# Patient Record
Sex: Male | Born: 1940
Health system: Southern US, Community
[De-identification: ages and names within clinical notes are randomized; demographics above are authoritative.]

## PROBLEM LIST (undated history)

## (undated) DIAGNOSIS — E119 Type 2 diabetes mellitus without complications: Secondary | ICD-10-CM

## (undated) DIAGNOSIS — K5790 Diverticulosis of intestine, part unspecified, without perforation or abscess without bleeding: Secondary | ICD-10-CM

## (undated) DIAGNOSIS — Z860100 Personal history of colon polyps, unspecified: Secondary | ICD-10-CM

## (undated) DIAGNOSIS — M1990 Unspecified osteoarthritis, unspecified site: Secondary | ICD-10-CM

## (undated) DIAGNOSIS — G20A1 Parkinson's disease without dyskinesia, without mention of fluctuations: Secondary | ICD-10-CM

## (undated) DIAGNOSIS — R351 Nocturia: Secondary | ICD-10-CM

## (undated) DIAGNOSIS — K2270 Barrett's esophagus without dysplasia: Secondary | ICD-10-CM

## (undated) DIAGNOSIS — I10 Essential (primary) hypertension: Secondary | ICD-10-CM

## (undated) DIAGNOSIS — N529 Male erectile dysfunction, unspecified: Secondary | ICD-10-CM

## (undated) DIAGNOSIS — H9190 Unspecified hearing loss, unspecified ear: Secondary | ICD-10-CM

## (undated) DIAGNOSIS — E785 Hyperlipidemia, unspecified: Secondary | ICD-10-CM

## (undated) DIAGNOSIS — F319 Bipolar disorder, unspecified: Secondary | ICD-10-CM

## (undated) DIAGNOSIS — K219 Gastro-esophageal reflux disease without esophagitis: Secondary | ICD-10-CM

## (undated) DIAGNOSIS — J449 Chronic obstructive pulmonary disease, unspecified: Secondary | ICD-10-CM

## (undated) DIAGNOSIS — K648 Other hemorrhoids: Secondary | ICD-10-CM

## (undated) DIAGNOSIS — N189 Chronic kidney disease, unspecified: Secondary | ICD-10-CM

## (undated) DIAGNOSIS — K52839 Microscopic colitis, unspecified: Secondary | ICD-10-CM

## (undated) DIAGNOSIS — E669 Obesity, unspecified: Secondary | ICD-10-CM

## (undated) DIAGNOSIS — M2540 Effusion, unspecified joint: Secondary | ICD-10-CM

## (undated) DIAGNOSIS — H409 Unspecified glaucoma: Secondary | ICD-10-CM

## (undated) DIAGNOSIS — M2550 Pain in unspecified joint: Secondary | ICD-10-CM

## (undated) DIAGNOSIS — N400 Enlarged prostate without lower urinary tract symptoms: Secondary | ICD-10-CM

## (undated) DIAGNOSIS — IMO0001 Reserved for inherently not codable concepts without codable children: Secondary | ICD-10-CM

## (undated) DIAGNOSIS — G4700 Insomnia, unspecified: Secondary | ICD-10-CM

## (undated) DIAGNOSIS — J189 Pneumonia, unspecified organism: Secondary | ICD-10-CM

## (undated) DIAGNOSIS — F419 Anxiety disorder, unspecified: Secondary | ICD-10-CM

## (undated) DIAGNOSIS — K5010 Crohn's disease of large intestine without complications: Secondary | ICD-10-CM

## (undated) DIAGNOSIS — F32A Depression, unspecified: Secondary | ICD-10-CM

## (undated) DIAGNOSIS — F329 Major depressive disorder, single episode, unspecified: Secondary | ICD-10-CM

## (undated) DIAGNOSIS — G4730 Sleep apnea, unspecified: Secondary | ICD-10-CM

## (undated) HISTORY — DX: Unspecified osteoarthritis, unspecified site: M19.90

## (undated) HISTORY — PX: ROTATOR CUFF REPAIR: SHX139

## (undated) HISTORY — DX: Sleep apnea, unspecified: G47.30

## (undated) HISTORY — PX: EYE SURGERY: SHX253

## (undated) HISTORY — DX: Parkinson's disease without dyskinesia, without mention of fluctuations: G20.A1

## (undated) HISTORY — DX: Microscopic colitis, unspecified: K52.839

## (undated) HISTORY — DX: Chronic obstructive pulmonary disease, unspecified: J44.9

## (undated) HISTORY — DX: Other hemorrhoids: K64.8

## (undated) HISTORY — DX: Male erectile dysfunction, unspecified: N52.9

## (undated) HISTORY — DX: Hyperlipidemia, unspecified: E78.5

## (undated) HISTORY — PX: TONSILLECTOMY: SUR1361

## (undated) HISTORY — PX: HERNIA REPAIR: SHX51

## (undated) HISTORY — DX: Benign prostatic hyperplasia without lower urinary tract symptoms: N40.0

## (undated) HISTORY — DX: Major depressive disorder, single episode, unspecified: F32.9

## (undated) HISTORY — DX: Barrett's esophagus without dysplasia: K22.70

## (undated) HISTORY — PX: UMBILICAL HERNIA REPAIR: SHX196

## (undated) HISTORY — DX: Essential (primary) hypertension: I10

## (undated) HISTORY — DX: Pneumonia, unspecified organism: J18.9

## (undated) HISTORY — DX: Depression, unspecified: F32.A

## (undated) HISTORY — DX: Crohn's disease of large intestine without complications: K50.10

## (undated) HISTORY — DX: Obesity, unspecified: E66.9

## (undated) HISTORY — DX: Type 2 diabetes mellitus without complications: E11.9

## (undated) HISTORY — DX: Diverticulosis of intestine, part unspecified, without perforation or abscess without bleeding: K57.90

## (undated) HISTORY — DX: Anxiety disorder, unspecified: F41.9

---

## 1995-08-15 HISTORY — PX: OTHER SURGICAL HISTORY: SHX169

## 2003-09-21 ENCOUNTER — Encounter: Admission: RE | Admit: 2003-09-21 | Discharge: 2003-12-20 | Payer: Self-pay | Admitting: Endocrinology

## 2004-12-11 ENCOUNTER — Encounter: Admission: RE | Admit: 2004-12-11 | Discharge: 2004-12-11 | Payer: Self-pay | Admitting: Endocrinology

## 2006-02-12 ENCOUNTER — Ambulatory Visit: Payer: Self-pay | Admitting: Pulmonary Disease

## 2006-02-13 ENCOUNTER — Ambulatory Visit: Admission: RE | Admit: 2006-02-13 | Discharge: 2006-02-13 | Payer: Self-pay | Admitting: Pulmonary Disease

## 2006-02-13 ENCOUNTER — Encounter: Payer: Self-pay | Admitting: Pulmonary Disease

## 2006-04-10 ENCOUNTER — Inpatient Hospital Stay (HOSPITAL_COMMUNITY): Admission: EM | Admit: 2006-04-10 | Discharge: 2006-04-11 | Payer: Self-pay | Admitting: Emergency Medicine

## 2006-08-14 HISTORY — PX: COLONOSCOPY W/ BIOPSIES: SHX1374

## 2007-01-02 ENCOUNTER — Ambulatory Visit: Payer: Self-pay | Admitting: Internal Medicine

## 2007-01-04 ENCOUNTER — Ambulatory Visit: Payer: Self-pay | Admitting: Gastroenterology

## 2007-01-13 DIAGNOSIS — K648 Other hemorrhoids: Secondary | ICD-10-CM

## 2007-01-13 DIAGNOSIS — K5790 Diverticulosis of intestine, part unspecified, without perforation or abscess without bleeding: Secondary | ICD-10-CM

## 2007-01-13 HISTORY — DX: Diverticulosis of intestine, part unspecified, without perforation or abscess without bleeding: K57.90

## 2007-01-13 HISTORY — DX: Other hemorrhoids: K64.8

## 2007-01-22 ENCOUNTER — Encounter: Payer: Self-pay | Admitting: Internal Medicine

## 2007-01-22 ENCOUNTER — Ambulatory Visit: Payer: Self-pay | Admitting: Internal Medicine

## 2007-01-22 DIAGNOSIS — K5730 Diverticulosis of large intestine without perforation or abscess without bleeding: Secondary | ICD-10-CM | POA: Insufficient documentation

## 2007-01-22 DIAGNOSIS — K649 Unspecified hemorrhoids: Secondary | ICD-10-CM | POA: Insufficient documentation

## 2007-03-05 ENCOUNTER — Ambulatory Visit: Payer: Self-pay | Admitting: Internal Medicine

## 2007-05-23 ENCOUNTER — Ambulatory Visit: Payer: Self-pay | Admitting: Internal Medicine

## 2007-10-13 DIAGNOSIS — J189 Pneumonia, unspecified organism: Secondary | ICD-10-CM

## 2007-10-13 HISTORY — DX: Pneumonia, unspecified organism: J18.9

## 2007-10-23 DIAGNOSIS — E119 Type 2 diabetes mellitus without complications: Secondary | ICD-10-CM | POA: Insufficient documentation

## 2007-10-23 DIAGNOSIS — F411 Generalized anxiety disorder: Secondary | ICD-10-CM | POA: Insufficient documentation

## 2007-10-23 DIAGNOSIS — J449 Chronic obstructive pulmonary disease, unspecified: Secondary | ICD-10-CM | POA: Insufficient documentation

## 2007-10-23 DIAGNOSIS — E669 Obesity, unspecified: Secondary | ICD-10-CM | POA: Insufficient documentation

## 2007-10-23 DIAGNOSIS — F329 Major depressive disorder, single episode, unspecified: Secondary | ICD-10-CM | POA: Insufficient documentation

## 2007-10-23 DIAGNOSIS — G4730 Sleep apnea, unspecified: Secondary | ICD-10-CM | POA: Insufficient documentation

## 2007-10-23 DIAGNOSIS — J45909 Unspecified asthma, uncomplicated: Secondary | ICD-10-CM | POA: Insufficient documentation

## 2007-10-23 DIAGNOSIS — J4489 Other specified chronic obstructive pulmonary disease: Secondary | ICD-10-CM | POA: Insufficient documentation

## 2007-10-23 DIAGNOSIS — E785 Hyperlipidemia, unspecified: Secondary | ICD-10-CM | POA: Insufficient documentation

## 2007-10-23 DIAGNOSIS — I10 Essential (primary) hypertension: Secondary | ICD-10-CM | POA: Insufficient documentation

## 2007-10-29 ENCOUNTER — Other Ambulatory Visit: Payer: Self-pay | Admitting: Emergency Medicine

## 2007-10-29 ENCOUNTER — Inpatient Hospital Stay (HOSPITAL_COMMUNITY): Admission: AD | Admit: 2007-10-29 | Discharge: 2007-11-04 | Payer: Self-pay | Admitting: Internal Medicine

## 2007-11-04 ENCOUNTER — Encounter: Payer: Self-pay | Admitting: Internal Medicine

## 2007-11-12 ENCOUNTER — Ambulatory Visit: Payer: Self-pay | Admitting: Pulmonary Disease

## 2007-12-23 ENCOUNTER — Ambulatory Visit: Payer: Self-pay | Admitting: Pulmonary Disease

## 2008-01-08 ENCOUNTER — Encounter (INDEPENDENT_AMBULATORY_CARE_PROVIDER_SITE_OTHER): Payer: Self-pay | Admitting: *Deleted

## 2008-01-15 ENCOUNTER — Ambulatory Visit: Payer: Self-pay | Admitting: Internal Medicine

## 2008-01-15 DIAGNOSIS — K5900 Constipation, unspecified: Secondary | ICD-10-CM | POA: Insufficient documentation

## 2008-01-15 DIAGNOSIS — K52839 Microscopic colitis, unspecified: Secondary | ICD-10-CM | POA: Insufficient documentation

## 2008-12-01 ENCOUNTER — Encounter: Admission: RE | Admit: 2008-12-01 | Discharge: 2008-12-01 | Payer: Self-pay | Admitting: Internal Medicine

## 2009-01-19 ENCOUNTER — Encounter: Admission: RE | Admit: 2009-01-19 | Discharge: 2009-01-19 | Payer: Self-pay | Admitting: Internal Medicine

## 2009-08-14 HISTORY — PX: CATARACT EXTRACTION W/ INTRAOCULAR LENS  IMPLANT, BILATERAL: SHX1307

## 2009-08-14 HISTORY — PX: CHOLECYSTECTOMY: SHX55

## 2009-08-14 HISTORY — PX: NOSE SURGERY: SHX723

## 2009-10-01 ENCOUNTER — Emergency Department (HOSPITAL_COMMUNITY): Admission: EM | Admit: 2009-10-01 | Discharge: 2009-10-01 | Payer: Self-pay | Admitting: Emergency Medicine

## 2009-11-22 ENCOUNTER — Emergency Department (HOSPITAL_BASED_OUTPATIENT_CLINIC_OR_DEPARTMENT_OTHER): Admission: EM | Admit: 2009-11-22 | Discharge: 2009-11-22 | Payer: Self-pay | Admitting: Emergency Medicine

## 2009-11-22 ENCOUNTER — Ambulatory Visit: Payer: Self-pay | Admitting: Diagnostic Radiology

## 2009-11-29 ENCOUNTER — Ambulatory Visit (HOSPITAL_COMMUNITY): Admission: RE | Admit: 2009-11-29 | Discharge: 2009-11-30 | Payer: Self-pay | Admitting: General Surgery

## 2009-11-29 ENCOUNTER — Encounter (INDEPENDENT_AMBULATORY_CARE_PROVIDER_SITE_OTHER): Payer: Self-pay | Admitting: General Surgery

## 2009-12-07 ENCOUNTER — Ambulatory Visit: Payer: Self-pay | Admitting: Otolaryngology

## 2010-02-16 DIAGNOSIS — F339 Major depressive disorder, recurrent, unspecified: Secondary | ICD-10-CM | POA: Insufficient documentation

## 2010-02-16 DIAGNOSIS — E1121 Type 2 diabetes mellitus with diabetic nephropathy: Secondary | ICD-10-CM | POA: Insufficient documentation

## 2010-06-04 ENCOUNTER — Emergency Department: Payer: Self-pay | Admitting: Emergency Medicine

## 2010-07-19 ENCOUNTER — Encounter: Admission: RE | Admit: 2010-07-19 | Discharge: 2010-07-19 | Payer: Self-pay | Admitting: Internal Medicine

## 2010-08-14 HISTORY — PX: PENILE PROSTHESIS IMPLANT: SHX240

## 2010-08-31 ENCOUNTER — Emergency Department: Payer: Self-pay | Admitting: Unknown Physician Specialty

## 2010-09-06 ENCOUNTER — Ambulatory Visit
Admission: RE | Admit: 2010-09-06 | Discharge: 2010-09-06 | Payer: Self-pay | Source: Home / Self Care | Attending: Internal Medicine | Admitting: Internal Medicine

## 2010-09-06 DIAGNOSIS — R197 Diarrhea, unspecified: Secondary | ICD-10-CM | POA: Insufficient documentation

## 2010-09-06 LAB — URINALYSIS, ROUTINE W REFLEX MICROSCOPIC
Bilirubin Urine: NEGATIVE
Hgb urine dipstick: NEGATIVE
Ketones, ur: NEGATIVE mg/dL
Nitrite: NEGATIVE
Protein, ur: NEGATIVE mg/dL
Specific Gravity, Urine: 1.017 (ref 1.005–1.030)
Urine Glucose, Fasting: NEGATIVE mg/dL
Urobilinogen, UA: 0.2 mg/dL (ref 0.0–1.0)
pH: 5.5 (ref 5.0–8.0)

## 2010-09-06 LAB — COMPREHENSIVE METABOLIC PANEL
ALT: 23 U/L (ref 0–53)
AST: 21 U/L (ref 0–37)
Albumin: 3.8 g/dL (ref 3.5–5.2)
Alkaline Phosphatase: 61 U/L (ref 39–117)
BUN: 17 mg/dL (ref 6–23)
CO2: 24 mEq/L (ref 19–32)
Calcium: 10 mg/dL (ref 8.4–10.5)
Chloride: 109 mEq/L (ref 96–112)
Creatinine, Ser: 1.27 mg/dL (ref 0.4–1.5)
GFR calc Af Amer: >60 mL/min (ref >60)
GFR calc non Af Amer: 57 mL/min — ABNORMAL LOW (ref >60)
Glucose, Bld: 112 mg/dL — ABNORMAL HIGH (ref 70–99)
Potassium: 4.3 mEq/L (ref 3.5–5.1)
Sodium: 140 mEq/L (ref 135–145)
Total Bilirubin: 0.6 mg/dL (ref 0.3–1.2)
Total Protein: 6.7 g/dL (ref 6.0–8.3)

## 2010-09-06 LAB — CBC
HCT: 49.6 % (ref 39.0–52.0)
Hemoglobin: 16.3 g/dL (ref 13.0–17.0)
MCH: 30.3 pg (ref 26.0–34.0)
MCHC: 32.9 g/dL (ref 30.0–36.0)
MCV: 92.2 fL (ref 78.0–100.0)
Platelets: 212 10*3/uL (ref 150–400)
RBC: 5.38 MIL/uL (ref 4.22–5.81)
RDW: 15.6 % — ABNORMAL HIGH (ref 11.5–15.5)
WBC: 7.6 10*3/uL (ref 4.0–10.5)

## 2010-09-06 LAB — APTT: aPTT: 25 seconds (ref 24–37)

## 2010-09-06 LAB — SURGICAL PCR SCREEN
MRSA, PCR: NEGATIVE
Staphylococcus aureus: NEGATIVE

## 2010-09-06 LAB — PROTIME-INR
INR: 0.88 (ref 0.00–1.49)
Prothrombin Time: 12.1 seconds (ref 11.6–15.2)

## 2010-09-08 ENCOUNTER — Inpatient Hospital Stay (HOSPITAL_COMMUNITY)
Admission: RE | Admit: 2010-09-08 | Discharge: 2010-09-09 | Payer: Self-pay | Source: Home / Self Care | Attending: Orthopedic Surgery | Admitting: Orthopedic Surgery

## 2010-09-08 LAB — GLUCOSE, CAPILLARY
Glucose-Capillary: 97 mg/dL (ref 70–99)
Glucose-Capillary: 127 mg/dL — ABNORMAL HIGH (ref 70–99)
Glucose-Capillary: 105 mg/dL — ABNORMAL HIGH (ref 70–99)
Glucose-Capillary: 130 mg/dL — ABNORMAL HIGH (ref 70–99)
Glucose-Capillary: 119 mg/dL — ABNORMAL HIGH (ref 70–99)

## 2010-09-09 LAB — GLUCOSE, CAPILLARY: Glucose-Capillary: 120 mg/dL — ABNORMAL HIGH (ref 70–99)

## 2010-09-13 NOTE — Discharge Summary (Signed)
Summary: Pneumonia admission 3/17-3/23  NAME:  [name], [name]                ACCOUNT NO.:  [id number]      MEDICAL RECORD NO.:  [id number]          PATIENT TYPE:  INP      LOCATION:  [id number]                         FACILITY:  [facility]      PHYSICIAN:  Janalyn Rouse, M.D.  DATE OF BIRTH:  Dec 29, 1940      DATE OF ADMISSION:  10/29/2007   DATE OF DISCHARGE:  11/04/2007                                  DISCHARGE SUMMARY      DISCHARGE DIAGNOSES:   1. Nausea/vomiting/diarrhea, likely viral gastroenteritis versus toxin       negative Clostridium difficile colitis, resolved.   2. Right upper lobe community-acquired pneumonia, improving.   3. Thrombocytopenia, resolved.   4. Asthma.   5. Type 2 diabetes.   6. Hypertension.   7. Obstructive sleep apnea.   8. Status post uvulopalatopharyngoplasty on continuous positive airway       pressure at home.   9. Depression/anxiety.   10.Hyperlipidemia.   11.Benign prostatic hypertrophy.   12.Erectile dysfunction.      DISCHARGE MEDICATIONS:   1. Augmentin 875 mg b.i.d. x3 days.   2. Flagyl 500 mg q.8 hours x7 days.   3. Phenergan 25 mg q.6 hours p.r.n. nausea.   4. Altace 10 mg daily.   5. Diovan 80 mg daily.   6. Lipitor 20 mg daily.   7. Bupropion XL 150 mg 3 daily.   8. Fortamet 1000 mg b.i.d.   9. Advair 250/50 1 puff b.i.d.   10.Combivent p.r.n.   11.Aspirin 81 mg daily.   12.Xanax 0.5 mg p.r.n.   13.Ambien 10 mg every night p.r.n.   14.Fish oil 3 daily.   15.Flomax 0.4 mg every night.   16.Viagra as needed.      HOSPITAL PROCEDURES:   1. CT of the chest patchy infiltrate of the right lobe and to a less       extent right lower lobe (October 31, 2007).   2. CT of the abdomen and pelvis with contrast (October 31, 2007).   3. Negative CT of the abdomen with exception of gallstones dependent       within the gallbladder.      HISTORY OF PRESENT ILLNESS:  For full details, please see dictated   history and physical.  Briefly, [name] is  a 70 year old [place] male   with a history of type 2 diabetes, hypertension, and asthma, who   presents to the office on October 29, 2007, with nausea/vomiting/diarrhea   for 2 days.  The patient has had a complicated course recently with   recurrent asthmatic bronchitis, which was treated from September 30, 2007, through October 07, 2007, with Avelox followed by a Sterapred   taper.  He had persistent symptoms, which were worsening and he was   given an additional 7 days of Levaquin 500 mg daily from October 15, 2007,   through October 22, 2007.  Of note, chest x-rays obtained on these dates   showed no signs of  infiltrate.  He was slowly improving  with this when   he developed acute onset of nausea, vomiting, and diarrhea 2 days prior   to presentation.  This began 12 hours after eating at a buffet   restaurant in [place].  None of the other family members who ate there   became sick, but he developed perfused watery diarrhea, which he   described as 30 episodes within 24-hour period.  He did have nausea and   vomiting with food tinged emesis with no bilious or hematemesis.  He   came to the emergency room on the evening prior to admission and was   discharged after receiving some fluid hydration.  However, he was unable   to keep fluids down at home and required admission for dehydration.      HOSPITAL COURSE:  The patient was admitted to a medical bed.  He was   placed empirically on Flagyl to cover C. difficile.  C. difficile toxin   was obtained x3 and were negative.  However, given his high risk of   exposure due to recent recurrent antibiotic use, he was continued on   Flagyl.  He also received IV fluids and scheduled antiemetics with   resolution of his nausea and vomiting rapidly and resolution of his   diarrhea within several days.  However, he continued to have fever up to   102 on the day following admission.  Chest and acute abdominal series   performed did show air-fluid  levels and nondistended bowel with   nonspecific patchy right upper lobe infiltrate, which necessitated   followup.  Therefore, on October 31, 2007 a CT of the chest, abdomen, and   pelvis was obtained that showed hazy right upper lobe infiltrate   consistent with pneumonia and minimal right lower lobe patchy   infiltrate.  Therefore, he was placed on Zosyn to treat resistant   community-required pneumonia and possible aspiration in the setting of   his nausea and vomiting.  With the Zosyn, his fever resolved and his   cough improved.  He continued to have mild shortness of breath.  Repeat   chest x-ray showed no residual infiltrate and no signs of pulmonary   edema.  With continued antibiotic therapy, in addition to his home   Advair and bronchodilator therapy, his breathing improved and is at this   point approaching baseline.  He is remained afebrile for several days   and is felt stable for discharge home on p.o. antibiotic therapy.  Given   response to Zosyn, he will continue on Augmentin as an alternative.  He   has failed 2 rounds of quinolone therapy which may indicate pneumonia   resistant to quinolones or a new event.      The patient's diabetes remained well controlled throughout this   hospitalization.  His metformin was held after his CT scan due to   contrast reaction and he was covered with sliding scale insulin.  His   blood pressure likewise remained well controlled.  Of note, he did have   transient thrombocytopenia with platelets count down to 120, but those   have returned to normal and are up to 154 on the day of discharge.  This   has likely resulted his acute illness.  He did not receive any Lovenox   during this hospitalization due to his thrombocytopenia.      At this point, the patient is stable for discharge home.  He is   tolerating a full diet with no nausea, vomiting,  or diarrhea.  His cough   and shortness of breath have improved.  He is tolerating p.o. and  will   be transitioned to p.o. antibiotics.      DISCHARGE LABS:  CBC shows a Mayo count of 6.1, hemoglobin 12.9,   platelets 154.  Urinary Legionella antigen negative.  Stool cultures,   O&P, and C. difficile negative x3.  On the day prior to discharge, BMET   showed a sodium of 142, potassium 3.3, chloride 107, bicarb 27, BUN 8,   creatinine 0.3, glucose 116.  AST was mildly evaluated at 58 and ALT of   45.      DISCHARGE DIET:  Cardiac prudent and diabetic diet.      DISCHARGE INSTRUCTIONS:  He was instructed to call if he has any further   nausea, vomiting, or diarrhea.  He is to call if he has fever or   worsening shortness of breath.      HOSPITAL FOLLOWUP:  He will follow up with Dr. Brigitte Pulse as scheduled for his   annual physical and will call if followup is needed prior to that time.      DISPOSITION:  Home.               Janalyn Rouse, M.D.   Electronically Signed            WS/MEDQ  D:  11/04/2007  T:  11/05/2007  Job:  [id number]      cc:   Gatha Mayer, MD,FACG   Ronaldo Miyamoto, M.D.

## 2010-09-13 NOTE — Miscellaneous (Signed)
Summary: cxr   Clinical Lists Changes  Orders: Added new Test order of T-2 View CXR, Same Day ([id number]) - Signed

## 2010-09-13 NOTE — Procedures (Signed)
Summary: Gastroenterology colon  Gastroenterology colon   Imported By: Renne Musca 10/25/2007 11:47:53  _____________________________________________________________________  External Attachment:    Type:   Image     Comment:   External Document

## 2010-09-13 NOTE — Assessment & Plan Note (Signed)
Summary: F/U FROM COLON ABOUT ONE YEAR AGO...AS.  Medications Added LEXAPRO 20 MG  TABS (ESCITALOPRAM OXALATE) Take 1 tablet by mouth once a day MIRALAX   POWD (POLYETHYLENE GLYCOL 3350) 1-2 doses/day SENOKOT S 8.6-50 MG  TABS (SENNOSIDES-DOCUSATE SODIUM) every 3 days prn      Allergies Added: ! * SHELLFISH  History of Present Illness Visit Type: follow up Primary GI MD: Silvano Rusk MD [facility] Primary Provider: Lang Snow, MD Chief Complaint: Patient c/o constipation x 2-3 months.  Patient notes that he often has mid-abdominal pain associated with constipation.  He denies any appearance of blood or melena.  No nausea or vomiting associated with the abdominal pain. History of Present Illness:   Laxative (dulcolax, Senokot, Phillips)once daily x 6 weeks. Also has had alternating bowel habits with loose and hard/constipation. Lexapro started 3 weeks ago and thinks symptoms worse since. Not using Entocort-EC at this time.  Was hospitalized with pneumonia and LFT's went up. He says LFT's back to normal. Thought secondary to meds.          Colonoscopy  Procedure date:  01/22/2007  Findings:      LEFT-SIDED DIVERTICULOSIS SMALL INTERNAL HEMORRHOIDS OTHERWISE NORMAL  RANDOM BIOPSIES TAKEN RE: DIARRHEA SP-Surgical Pathology - STATUS: Final  .          By: Brigitte Pulse MD , JO ANN           Perform Date: 10Jun08 00:01  Ordered By: Carlean Purl MD , Maxtyn Nuzum             Ordered Date: 11Jun08 14:47  Facility: [facility]                               Department: [place]  Service Report Text  [facility]   [address] [place], [place] [address]   Telephone [phone] or [phone] Fax [phone]    REPORT OF SURGICAL PATHOLOGY    Case #: [id number]   Patient Name: [name], [name]   Office Chart Number: [id number]    MRN: [id number]   Pathologist: Neldon Mc, MD   DOB/Age [birth year] (Age: 70) Gender: M   Date Taken: 01/22/2007   Date Received: 01/23/2007    FINAL  DIAGNOSIS    ***MICROSCOPIC EXAMINATION AND DIAGNOSIS***    COLON, RANDOM BIOPSIES: RARE GRANULOMA AND MILD CHRONIC CHANGES,   SEE COMMENT.    COMMENT   There are multiple fragments of benign colorectal type mucosa.   In a few of the fragments there is moderate chronic inflammation   with some irregularity of the gland architecture. There is also   some focal subtle basal plasmacytosis. One fragment shows a rare   ill-defined granuloma near the muscularis mucosae. No active   inflammation or dysplasia is seen.    The findings are those of a rare granuloma associated with mild   chronic changes. This suggests chronic colitis and the   differential diagnosis includes Crohn' s colitis in the   appropriate clinical setting. Clinical correlation is   recommended. (JAS:mj 01/24/07)    mj   Date Reported: 01/24/2007 Neldon Mc, MD   *** Electronically Signed Out By JAS ***    Comments:      Repeat colonoscopy in 10 years.    Procedures Next Due Date:    Colonoscopy: 02/2017     Updated Prior Medication List: ADVAIR DISKUS 500-50 MCG/DOSE  MISC (FLUTICASONE-SALMETEROL) Inhale 1 puff  two times a day SINGULAIR 10 MG  TABS (MONTELUKAST SODIUM) take by mouth as needed ALTACE 10 MG  CAPS (RAMIPRIL) Take 1 tablet by mouth once a day DIOVAN 80 MG  TABS (VALSARTAN) Take 1 tablet by mouth once a day LOW-DOSE ASPIRIN 81 MG  TABS (ASPIRIN) Take 1 tablet by mouth once a day FORTAMET 1000 MG  TB24 (METFORMIN HCL) Take 1 tablet by mouth two times a day FLOMAX 0.4 MG  CP24 (TAMSULOSIN HCL) Take 1 tablet by mouth once a day WELLBUTRIN XL 300 MG  TB24 (BUPROPION HCL) Take 1 tablet by mouth once a day LIPITOR 10 MG  TABS (ATORVASTATIN CALCIUM) Take 1 tablet by mouth once a day COMBIVENT 103-18 MCG/ACT  AERO (IPRATROPIUM-ALBUTEROL) inhale 2 puffs as needed AMBIEN 10 MG  TABS (ZOLPIDEM TARTRATE) take by mouth as needed XANAX 0.5 MG  TABS (ALPRAZOLAM) take by mouth as needed LEXAPRO 20 MG  TABS  (ESCITALOPRAM OXALATE) Take 1 tablet by mouth once a day  Current Allergies (reviewed today): ! * SHELLFISH  Past Medical History:    Reviewed history from 10/23/2007 and no changes required:       OBESITY (ICD-278.00)       HYPERLIPIDEMIA (ICD-272.4)       DEPRESSION (ICD-311)       ANXIETY (ICD-300.00)       SLEEP APNEA (ICD-780.57)       HYPERTENSION (ICD-401.9)       COPD (ICD-496)       DIABETES MELLITUS TYPE II (ICD-250.00)       DIVERTICULOSIS, COLON (ICD-562.10)       MICROSCOPIC COLITIS       ASTHMA (ICD-493.90)       BENIGN PROISTATIC HYPERTROPHY       ERECTILE DYSFUNCTION       PNEUMONIA 3/09  Past Surgical History:    Reviewed history from 10/23/2007 and no changes required:       Tonsillectomy       Umbilical Herniorraphy       Sleep Apnea Surgery   Family History:    Family History of Esophageal Cancer: Paternal Grandmother  Social History:    Reviewed history and no changes required:       Occupation: Retired       Patient has never smoked.        Alcohol Use - no       Daily Caffeine Use-1-2 cups daily       Illicit Drug Use - no       Patient gets regular exercise.   Risk Factors:  Tobacco use:  never Drug use:  no Alcohol use:  no Exercise:  yes  Colonoscopy History:     Date of Last Colonoscopy:  01/22/2007    Results:  LEFT-SIDED DIVERTICULOSIS SMALL INTERNAL HEMORRHOIDS OTHERWISE NORMAL  RANDOM BIOPSIES TAKEN RE: DIARRHEA SP-Surgical Pathology - STATUS: Final  .          By: Brigitte Pulse MD , [place] ANN           Perform Date: 10Jun08 00:01  Ordered By: Carlean Purl MD , Chancy Claros             Ordered Date: 11Jun08 14:47  Facility: [facility]                               Department: [place]  Service Report Text  [facility]   [address] [place], [place]  [id number]   Telephone [phone] or [phone] Fax [phone]    REPORT OF SURGICAL PATHOLOGY    Case #: [id number]   Patient Name: JAIDIN, SHKOLNIK   Office Chart Number:  [id number]    MRN: [id number]   Pathologist: Neldon Mc, MD   DOB/Age [birth year] (Age: 88) Gender: M   Date Taken: 01/22/2007   Date Received: 01/23/2007    FINAL DIAGNOSIS    ***MICROSCOPIC EXAMINATION AND DIAGNOSIS***    COLON, RANDOM BIOPSIES: RARE GRANULOMA AND MILD CHRONIC CHANGES,   SEE COMMENT.    COMMENT   There are multiple fragments of benign colorectal type mucosa.   In a few of the fragments there is moderate chronic inflammation   with some irregularity of the gland architecture. There is also   some focal subtle basal plasmacytosis. One fragment shows a rare   ill-defined granuloma near the muscularis mucosae. No active   inflammation or dysplasia is seen.    The findings are those of a rare granuloma associated with mild   chronic changes. This suggests chronic colitis and the   differential diagnosis includes Crohn' s colitis in the   appropriate clinical setting. Clinical correlation is   recommended. (JAS:mj 01/24/07)    mj   Date Reported: 01/24/2007 Neldon Mc, MD   *** Electronically Signed Out By JAS ***       Vital Signs:  Patient Profile:   70 Years Old Male Height:     69 inches Weight:      204.38 pounds BMI:     30.29 BSA:     2.09 Pulse rate:   80 / minute Pulse rhythm:   regular BP sitting:   122 / 74  (left arm)  Vitals Entered By: Awilda Bill CMA (January 15, 2008 10:47 AM)                  Physical Exam  General:     Well developed, well nourished, no acute distress. Eyes:     anicteric Neck:     Supple; no masses or thyromegaly. Lungs:     Clear throughout to auscultation. Heart:     Regular rate and rhythm; no murmurs, rubs,  or bruits. Abdomen:     Soft, nontender and nondistended. No masses, hepatosplenomegaly. Normal bowel sounds. Medium ventral hernia upper midline Cervical Nodes:     No significant cervical adenopathy. Psych:     Alert and cooperative. Normal mood and affect.    Impression &  Recommendations:  Problem # 1:  CONSTIPATION (ICD-564.00) Assessment: New I suspect a component of IBS, ? medication effects also.  Will try Miralax and as needed stimulant laxative q 2-3 days.  Problem # 2:  COLITIS (ICD-558.9) Assessment: Improved This does not seem to be active now. ? if constipation is related.   Patient Instructions: 1)  Use MiraLax 1-2 doses daily for constipation. 2)  If no bowel movement for 3 days then take senokot (on third day). 3)  Please schedule a follow-up appointment as needed. 4)  Copy Sent To: Drs. Layla Barter    ]

## 2010-09-13 NOTE — Assessment & Plan Note (Signed)
Summary: f/u pna and asthma   PCP:  Lang Snow  Chief Complaint:  Follow up. Pt was hospitalized for pneumonia recently.  pt states he is feeling much better today. [name]  History of Present Illness:  patient returns today for follow-up of his asthma.  He was last seen in 2007, where  pulmonary function studies were ordered and his protime pump inhibitor was increased to b.i.d. dosing for concern of laryngopharyngeal reflux.  The patient had also been noncompliant with his Advair at that time.  He underwent pulmonary function studies that were essentially normal, and never returned for follow-up despite multiple calls to try and get him to do so.  He comes in today where he was recently hospitalized for pneumonia and possible antibiotic-induced diarrhea.  A chest CT during that time showed an infiltrate in the right upper lobe.  From a pulmonary standpoint, he is doing very well.  He feels that his breathing is adequate, and he states that he has been compliant with his Advair and Singulair.  He has no significant cough or purulence.  He has had no fevers chills or sweats.         Vital Signs:  Patient Profile:   70 Years Old Male Weight:      211.50 pounds O2 Sat:      94 % O2 treatment:    Room Air Temp:     97.6 degrees F oral Pulse rate:   80 / minute BP sitting:   104 / 72  (left arm) Cuff size:   regular  Vitals Entered By: Valrie Hart LPN (March 31, [id number] [id number] PM)             Comments Medications reviewed with patient  ..................................................................Benjamin KitchenValrie Hart LPN  March 31, [id number] [id number] PM      Physical Exam  General:     obese male in nad Lungs:     totally clear, no wheezing Heart:     rrr, no mrg     Impression & Recommendations:  Problem # 1:  ASTHMA (ICD-493.90) currently doing well from this standpoint on his usual asthma meds.  He has gotten back to his usual baseline, and has no symptoms of airflow limitation.  He  needs to work on weight loss, and  a more aggressive exercise program.  The good news is that his pft's did not show any fixed airflow obstruction.  I stressed to hm the importance of med compliance. His updated medication list for this problem includes:    Advair Diskus 500-50 Mcg/dose Misc (Fluticasone-salmeterol) ..... Inhale 1 puff two times a day    Singulair 10 Mg Tabs (Montelukast sodium) .[name]... Take by mouth as needed    Combivent 103-18 Mcg/act Aero (Ipratropium-albuterol) ..... Inhale 2 puffs as needed    Problem # 2:  PNEUMONIA DUE TO OTHER SPECIFIED ORGANISM (ICD-483.8) the pt has had no significant cough, purulence, or fever.  He will need a follow cxr in another 4 weeks to make sure has completely cleared.   Medications Added to Medication List This Visit: 1)  Advair Diskus 500-50 Mcg/dose Misc (Fluticasone-salmeterol) .... Inhale 1 puff two times a day 2)  Singulair 10 Mg Tabs (Montelukast sodium) .... Take by mouth as needed 3)  Altace 10 Mg Caps (Ramipril) .... Take 1 tablet by mouth once a day 4)  Diovan 80 Mg Tabs (Valsartan) .... Take 1 tablet by mouth once a day 5)  Low-dose Aspirin 81 Mg Tabs (Aspirin) .... Take 1  tablet by mouth once a day 6)  Nexium 40 Mg Cpdr (Esomeprazole magnesium) .... Take 1 tablet by mouth once a day 7)  Fortamet 1000 Mg Tb24 (Metformin hcl) .... Take 1 tablet by mouth two times a day 8)  Flomax 0.4 Mg Cp24 (Tamsulosin hcl) .... Take 1 tablet by mouth once a day 9)  Wellbutrin Xl 300 Mg Tb24 (Bupropion hcl) .... Take 1 tablet by mouth once a day 10)  Lipitor 10 Mg Tabs (Atorvastatin calcium) .... Take 1 tablet by mouth once a day 11)  Combivent 103-18 Mcg/act Aero (Ipratropium-albuterol) .... Inhale 2 puffs as needed 12)  Ambien 10 Mg Tabs (Zolpidem tartrate) .... Take by mouth as needed 13)  Xanax 0.5 Mg Tabs (Alprazolam) .... Take by mouth as needed   Patient Instructions: 1)  f/u with me in one year. 2)  need a chest xray in the next 4  weeks to follow up pneumonia.this has been put into the computer, and the patient will do this at his convenience.  I will let him know the results. 3)  no change in meds.    ]

## 2010-09-13 NOTE — Letter (Signed)
Summary:  Pulmonary Results Follow Up Letter  [facility]  [address], [place] [address]   Phone: [phone]  Fax: [phone]    01/08/2008 MRN: [id number]  [name] [address] [place], [place]  [id number]  Dear [name],  We have received the results from your recent tests and have been unable to contact you.  Please call our office at [phone] so that Dr. Gwenette Greet or his nurse may review the results with you.    Thank you,  Therapist, [facility]

## 2010-09-15 NOTE — Assessment & Plan Note (Signed)
Summary: DIARRHEA/YF    History of Present Illness Visit Type: Follow-up Visit Primary GI MD: Silvano Rusk MD [facility] Primary Provider: Lutricia Feil, MD Requesting Provider: na Chief Complaint: diarrhea History of Present Illness:   Since April 2011 cholecystectomy he has had urgent post-prandial defecation with "explosive diarrhea". No incontinence and no nocturnal problems. always at breakfast and sometimes later in the evening. No pain. Imodium has not really helped.  I had seen him in the past for a different diarrhea problem with amicroscopic colitis (granulomatous) that did not respond to entocort but did respons to Xifaxan twice.  he says he has lost 10# sine April.      GI Review of Systems      Denies abdominal pain, acid reflux, belching, bloating, chest pain, dysphagia with liquids, dysphagia with solids, heartburn, loss of appetite, nausea, vomiting, vomiting blood, weight loss, and  weight gain.      Reports diarrhea.     Denies anal fissure, black tarry stools, change in bowel habit, constipation, diverticulosis, fecal incontinence, heme positive stool, hemorrhoids, irritable bowel syndrome, jaundice, light color stool, liver problems, rectal bleeding, and  rectal pain.    Clinical Reports Reviewed:  Colonoscopy:  01/22/2007:  LEFT-SIDED DIVERTICULOSIS SMALL INTERNAL HEMORRHOIDS OTHERWISE NORMAL  RANDOM BIOPSIES TAKEN RE: DIARRHEA SP-Surgical Pathology - STATUS: Final  .          By: Brigitte Pulse MD , JO ANN           Perform Date: 10Jun08 00:01  Ordered By: Carlean Purl MD , Kenyatte Gruber             Ordered Date: 11Jun08 14:47  Facility: [facility]                               Department: [place]  Service Report Text  [facility]   [address], [place] [address]   Telephone [phone] or [phone] Fax [phone]    REPORT OF SURGICAL PATHOLOGY    Case #: [id number]   Patient Name: [name], [name]   Office Chart Number: [id number]   MRN: [id number]   Pathologist: Neldon Mc, MD   DOB/Age [birth year] (Age: 70) Gender: M   Date Taken: 01/22/2007   Date Received: 01/23/2007    FINAL DIAGNOSIS    ***MICROSCOPIC EXAMINATION AND DIAGNOSIS***    COLON, RANDOM BIOPSIES: RARE GRANULOMA AND MILD CHRONIC CHANGES,   SEE COMMENT.    COMMENT   There are multiple fragments of benign colorectal type mucosa.   In a few of the fragments there is moderate chronic inflammation   with some irregularity of the gland architecture. There is also   some focal subtle basal plasmacytosis. One fragment shows a rare   ill-defined granuloma near the muscularis mucosae. No active   inflammation or dysplasia is seen.    The findings are those of a rare granuloma associated with mild   chronic changes. This suggests chronic colitis and the   differential diagnosis includes Crohn' s colitis in the   appropriate clinical setting. Clinical correlation is   recommended. (JAS:mj 01/24/07)    mj   Date Reported: 01/24/2007 Neldon Mc, MD   *** Electronically Signed Out By JAS ***   Current Medications (verified): 1)  Advair Diskus 500-50 Mcg/dose  Misc (Fluticasone-Salmeterol) .... Inhale 1 Puff Two Times A Day 2)  Altace 10 Mg  Caps (Ramipril) .... Take 1 Tablet By Mouth  Once A Day 3)  Diovan 80 Mg  Tabs (Valsartan) .... Take 1 Tablet By Mouth Once A Day 4)  Low-Dose Aspirin 81 Mg  Tabs (Aspirin) .... Take 1 Tablet By Mouth Once A Day 5)  Fortamet 1000 Mg  Tb24 (Metformin Hcl) .... Take 1 Tablet By Mouth Two Times A Day 6)  Wellbutrin Xl 300 Mg  Tb24 (Bupropion Hcl) .... Take 1 Tablet By Mouth Once A Day 7)  Lipitor 10 Mg  Tabs (Atorvastatin Calcium) .... Take 1 Tablet By Mouth Once A Day 8)  Combivent 103-18 Mcg/act  Aero (Ipratropium-Albuterol) .... Inhale 2 Puffs As Needed 9)  Ambien 10 Mg  Tabs (Zolpidem Tartrate) .... Take By Mouth As Needed 10)  Xanax 0.5 Mg  Tabs (Alprazolam) .... Take By Mouth As Needed 11)  Zoloft 100 Mg Tabs  (Sertraline Hcl) .... One Tablet By Mouth Once Daily 12)  Imodium A-D 2 Mg Tabs (Loperamide Hcl) .... As Needed For Diarrhea  Allergies (verified): 1)  ! * Shellfish  Past History:  Past Medical History: MICROSCOPIC COLITIS (ICD-558.9) OBESITY (ICD-278.00) HYPERLIPIDEMIA (ICD-272.4) DEPRESSION (ICD-311) ANXIETY (ICD-300.00) SLEEP APNEA (ICD-780.57) HYPERTENSION (ICD-401.9) COPD (ICD-496) DIABETES MELLITUS (ICD-250.00) ASTHMA (ICD-493.90) BENIGN PROISTATIC HYPERTROPHY ERECTILE DYSFUNCTION PNEUMONIA 3/09  Past Surgical History: Tonsillectomy Umbilical Herniorraphy Sleep Apnea Surgery Cholecystectomy--April   Family History: Family History of Esophageal Cancer: Paternal Grandmother No FH of Colon Cancer:  Social History: Reviewed history from 01/15/2008 and no changes required. Occupation: Retired Patient has never smoked.  Alcohol Use - no Daily Caffeine 123XX123 cups daily Illicit Drug Use - no Patient gets regular exercise.  Review of Systems       depression controlled by medication  Vital Signs:  Patient profile:   70 year old male Height:      69 inches Weight:      200 pounds BMI:     29.64 BSA:     2.07 Pulse rate:   88 / minute Pulse rhythm:   regular BP sitting:   134 / 80  (left arm) Cuff size:   regular  Vitals Entered By: Hope Pigeon CMA (September 06, 2010 1:58 PM)  Physical Exam  General:  Well developed, well nourished, no acute distress. Lungs:  Clear throughout to auscultation. Heart:  Regular rate and rhythm; no murmurs, rubs,  or bruits. Abdomen:  Soft, nontender and nondistended. No masses, hepatosplenomegaly. Normal bowel sounds. Medium diastasis recti upper midline Psych:  mildly flat affect, appropriate otherwise   Impression & Recommendations:  Problem # 1:  DIARRHEA (ICD-787.91) Assessment New This seems to be different from prior prblems and related to cholecystectomy. I suspect post-cholecystectomy diarrhea from  bile-salts and will try colestipol.  Problem # 2:  COLITIS (ICD-558.9) Assessment: Improved Do ot think his microscopic colitis is the issue but cannot be sure. Given the temporal association of diarrhea with cholecystectomy and long period of time without problems (2009) before, doubt this is flaring. will see how he does with colestipol. He understands the plan.  Patient Instructions: 1)  Please pick up your medications at your pharmacy. 2)  Colestipol is the new medication for diarrhea. 3)  If this is not helping after 1 month call us back. 4)  If it is, continue and see Dr. Carlean Purl in 1 year. 5)  Copy sent to : Lutricia Feil, MD 6)  The medication list was reviewed and reconciled.  All changed / newly prescribed medications were explained.  A complete medication list was provided to the patient /  caregiver. Prescriptions: COLESTIPOL HCL 5 GM PACK (COLESTIPOL HCL) 1 packet in water with lunch for diarrhea  #30 x 5   Entered and Authorized by:   Gatha Mayer MD, [facility]   Signed by:   Gatha Mayer MD, [facility] on 09/06/2010   Method used:   Electronically to        [facility] (retail)       [address] [place],   [address]       Ph: [phone]       Fax: [phone]   RxID:   [id number]  also cc: Serita Grammes, MD

## 2010-09-26 NOTE — Op Note (Signed)
NAME:  [name], [name]                ACCOUNT NO.:  [id number]  MEDICAL RECORD NO.:  [id number]          PATIENT TYPE:  INP  LOCATION:  5017                         FACILITY:  [facility]  PHYSICIAN:  Metta Clines. Lawana Hartzell, M.D.  DATE OF BIRTH:  Nov 28, 1940  DATE OF PROCEDURE:  09/08/2010 DATE OF DISCHARGE:                              OPERATIVE REPORT   PREOPERATIVE DIAGNOSES: 1. Chronic left shoulder impingement syndrome. 2. Left shoulder symptomatic acromioclavicular joint arthropathy.  POSTOPERATIVE DIAGNOSES: 1. Chronic left shoulder impingement syndrome. 2. Left shoulder symptomatic acromioclavicular joint arthropathy. 3. Complex and extensive degenerative labral tear. 4. Partial articular rotator cuff tear. 5. Partial bursal side rotator cuff tear.  PROCEDURES: 1. Left shoulder examination under anesthesia 2. Left shoulder glenohumeral joint diagnostic arthroscopy. 3. Debridement of complex and extensive superior as well as posteriorlabral tear. 4. Debridement of partial articular rotator cuff tear. 5. Arthroscopic subacromial decompression, bursectomy, and debridement     of partial bursal-sided rotator cuff tear. 6. Arthroscopic distal clavicle resection.  SURGEON:  Metta Clines. Allysia Ingles, MD  ASSISTANT:  Reather Laurence. Shuford, PA-C  ANESTHESIA:  General endotracheal as well as an interscalene block.  ESTIMATED BLOOD LOSS:  Minimal.  DRAINS:  None.  HISTORY:  [name] is a 70 year old gentleman who has had chronic left shoulder pain with impingement syndrome and symptomatic AC joint arthropathy, but has been refractory to prolonged attempts at conservative management.  Due to his ongoing pain and functional limitations, he is brought to the operating room at this time for planned left shoulder arthroscopy as described below.  Preoperatively, I counseled Mr. Fouty on treatment options as well as risks versus benefits thereof.  Possible surgical complications were reviewed including  potential bleeding, infection, neurovascular injury, persistent pain, loss of motion, anesthetic complication, recurrent rotator cuff tear, and possible need for additional surgery.  He understands and accepts and agrees with our planned procedure.  PROCEDURE IN DETAIL:  After undergoing routine preop evaluation, the patient received prophylactic antibiotics and an interscalene block was established in the holding area by the Anesthesia Department.  He was placed supine on the operating table and underwent smooth induction of general endotracheal anesthesia.  He was turned to the right lateral decubitus position on a beanbag and probed, padded, and protected.  Left shoulder examination under anesthesia revealed full passive motion and no instability patterns were noted.  Left arm was suspended at 70 degrees of abduction with 10 pounds of traction.  The left shoulder girdle region was sterilely prepped and draped in a standard fashion.  A time-out was called.  Posterior portal was established in the glenohumeral joint and anterior portal was established under direct visualization.  The glenohumeral articular surfaces were in overall good condition with a very minimal fibrillation superficially in the humeral head and glenoid.  No instability patterns were noted, but there was degenerative tearing of the superior and posterior labrum.  These areas were all debrided with a shaver back to stable margin.  There was a partial articular rotator cuff tear involving the distal supraspinatus which I would estimate to account it for the more than 10-15% thickness of  the tendon.  These areas were debrided with a shaver back to healthy tissue and the Stryker wand was used to obtain hemostasis.  Biceps anchor was stable.  Biceps tendon showed normal caliber and no evidence for distal instability.  Final inspection was then complete.  Fluid and instruments were removed.  The arm dropped down to 30  degrees of abduction with arthroscope introduced to the subacromial space and the posterior portal and a direct lateral portal was established in the subacromial space.  Abundant prolific bursal tissue and multiple adhesions were encountered and these were divided and excised with a combination of shaver and Stryker wand.  Wand was then used to remove the periosteum from the undersurface of the anterior half of the acromion.  A subacromial depression was performed with a bur creating a type 1 morphology.  Portal was then established directly anterior to the distal clavicle and distal clavicle resection performed with a bur. Care was taken to make sure the entire circumference of the distal clavicle could be visualized to ensure adequate removal of bone.  We then completed a subacromial/subdeltoid bursectomy and again the subacromial space was markedly stenotic with dense adhesions and complete bursectomy and appropriate decompression was achieved.  We also found a significant bursal-sided rotator cuff tear involving the rotator cuff of the musculotendinous junction adjacent to the [facility] joint and there had clearly been extrinsic compression and partial tearing of the rotator cuff on the bursal side related to the [facility] joint osteophytes. This area was nicely decompressed and no evidence for ongoing impingement was noted after the bony work was completed.  We probed this area, inspected it carefully, and did not see any evidence to suggest full-thickness defect and there was significant amount of tissue traversing this frayed area suggesting appropriate healing potential, so no rotator cuff repair was indicated.  We then completed the bursectomy and maintained hemostasis.  Fluid and instruments were removed.  The port was closed with Monocryl and Steri-Strips.  A bulky dry dressing was then taped to the left shoulder.  Left arm was put in sling immobilizer.  The patient was then awakened,  extubated, and taken to the recovery room in stable condition.     Metta Clines. Breyton Vanscyoc, M.D.     KMS/MEDQ  D:  09/08/2010  T:  09/08/2010  Job:  [id number]  Electronically Signed by Justice Britain M.D. on 09/26/2010 12:04:32 PM

## 2010-11-01 LAB — GLUCOSE, CAPILLARY
Glucose-Capillary: 118 mg/dL — ABNORMAL HIGH (ref 70–99)
Glucose-Capillary: 185 mg/dL — ABNORMAL HIGH (ref 70–99)

## 2010-11-02 LAB — COMPREHENSIVE METABOLIC PANEL
ALT: 22 U/L (ref 0–53)
ALT: 49 U/L (ref 0–53)
AST: 22 U/L (ref 0–37)
AST: 50 U/L — ABNORMAL HIGH (ref 0–37)
Albumin: 3.6 g/dL (ref 3.5–5.2)
Albumin: 3.8 g/dL (ref 3.5–5.2)
Alkaline Phosphatase: 62 U/L (ref 39–117)
Alkaline Phosphatase: 76 U/L (ref 39–117)
BUN: 18 mg/dL (ref 6–23)
BUN: 18 mg/dL (ref 6–23)
CO2: 24 mEq/L (ref 19–32)
CO2: 30 mEq/L (ref 19–32)
Calcium: 10.2 mg/dL (ref 8.4–10.5)
Calcium: 9.8 mg/dL (ref 8.4–10.5)
Chloride: 104 mEq/L (ref 96–112)
Chloride: 106 mEq/L (ref 96–112)
Creatinine, Ser: 1.43 mg/dL (ref 0.4–1.5)
Creatinine, Ser: 1.48 mg/dL (ref 0.4–1.5)
GFR calc Af Amer: 58 mL/min — ABNORMAL LOW (ref >60)
GFR calc Af Amer: 60 mL/min — ABNORMAL LOW (ref >60)
GFR calc non Af Amer: 48 mL/min — ABNORMAL LOW (ref >60)
GFR calc non Af Amer: 49 mL/min — ABNORMAL LOW (ref >60)
Glucose, Bld: 136 mg/dL — ABNORMAL HIGH (ref 70–99)
Glucose, Bld: 167 mg/dL — ABNORMAL HIGH (ref 70–99)
Potassium: 3.8 mEq/L (ref 3.5–5.1)
Potassium: 4.4 mEq/L (ref 3.5–5.1)
Sodium: 135 mEq/L (ref 135–145)
Sodium: 144 mEq/L (ref 135–145)
Total Bilirubin: 0.7 mg/dL (ref 0.3–1.2)
Total Bilirubin: 1.3 mg/dL — ABNORMAL HIGH (ref 0.3–1.2)
Total Protein: 6.8 g/dL (ref 6.0–8.3)
Total Protein: 6.8 g/dL (ref 6.0–8.3)

## 2010-11-02 LAB — DIFFERENTIAL
Basophils Absolute: 0 10*3/uL (ref 0.0–0.1)
Basophils Absolute: 0 10*3/uL (ref 0.0–0.1)
Basophils Relative: 0 % (ref 0–1)
Basophils Relative: 0 % (ref 0–1)
Eosinophils Absolute: 0.2 10*3/uL (ref 0.0–0.7)
Eosinophils Absolute: 0.4 10*3/uL (ref 0.0–0.7)
Eosinophils Relative: 2 % (ref 0–5)
Eosinophils Relative: 5 % (ref 0–5)
Lymphocytes Relative: 14 % (ref 12–46)
Lymphocytes Relative: 15 % (ref 12–46)
Lymphs Abs: 1.2 10*3/uL (ref 0.7–4.0)
Lymphs Abs: 1.3 10*3/uL (ref 0.7–4.0)
Monocytes Absolute: 0.7 10*3/uL (ref 0.1–1.0)
Monocytes Absolute: 0.8 10*3/uL (ref 0.1–1.0)
Monocytes Relative: 8 % (ref 3–12)
Monocytes Relative: 9 % (ref 3–12)
Neutro Abs: 5.8 10*3/uL (ref 1.7–7.7)
Neutro Abs: 6.8 10*3/uL (ref 1.7–7.7)
Neutrophils Relative %: 71 % (ref 43–77)
Neutrophils Relative %: 76 % (ref 43–77)

## 2010-11-02 LAB — URINALYSIS, ROUTINE W REFLEX MICROSCOPIC
Bilirubin Urine: NEGATIVE
Glucose, UA: NEGATIVE mg/dL
Hgb urine dipstick: NEGATIVE
Ketones, ur: NEGATIVE mg/dL
Nitrite: NEGATIVE
Protein, ur: NEGATIVE mg/dL
Specific Gravity, Urine: 1.024 (ref 1.005–1.030)
Urobilinogen, UA: 0.2 mg/dL (ref 0.0–1.0)
pH: 6 (ref 5.0–8.0)

## 2010-11-02 LAB — CBC
HCT: 46 % (ref 39.0–52.0)
HCT: 46.2 % (ref 39.0–52.0)
Hemoglobin: 15.2 g/dL (ref 13.0–17.0)
Hemoglobin: 15.5 g/dL (ref 13.0–17.0)
MCHC: 32.9 g/dL (ref 30.0–36.0)
MCHC: 33.8 g/dL (ref 30.0–36.0)
MCV: 90.3 fL (ref 78.0–100.0)
MCV: 93.7 fL (ref 78.0–100.0)
Platelets: 167 10*3/uL (ref 150–400)
Platelets: 182 10*3/uL (ref 150–400)
RBC: 4.93 MIL/uL (ref 4.22–5.81)
RBC: 5.09 MIL/uL (ref 4.22–5.81)
RDW: 16.1 % — ABNORMAL HIGH (ref 11.5–15.5)
RDW: 17 % — ABNORMAL HIGH (ref 11.5–15.5)
WBC: 8.2 10*3/uL (ref 4.0–10.5)
WBC: 9 10*3/uL (ref 4.0–10.5)

## 2010-11-02 LAB — LIPASE, BLOOD: Lipase: 31 U/L (ref 11–59)

## 2010-12-02 ENCOUNTER — Emergency Department: Payer: Self-pay | Admitting: Emergency Medicine

## 2010-12-27 NOTE — Assessment & Plan Note (Signed)
[name], [name]                         MRN:          [id number]  DATE:03/05/2007                            DOB:          02-28-1941    CHIEF COMPLAINT:  Follow up of diarrhea, colitis.   HISTORY:  His colonoscopy demonstrated chronic inflammatory change and  rare granulomas.  I tried him on Entocort EC, which did not make a  difference.  He still has a lot of bloating, gaseousness, and loose  stools.  Sometimes he is not sure if it is gas or stool.   His past medical history is reviewed and unchanged from Jan 02, 2007  with the exception of the colonoscopy results.  He also had left-sided  diverticulosis and small internal hemorrhoids.   He stopped his Zoloft, and that has not made a difference either.   REVIEW OF SYSTEMS:  He does not report fevers or bleeding.  Still  planning on a trip to [place] in the fall.   PHYSICAL EXAMINATION:  Weight 207 pounds.  Pulse 88, blood pressure  90/52.   ASSESSMENT:  Diarrhea problems, relatively chronic, although he had  alternating bowel habits, and he has had a lot of bloating and  gaseousness.  The biopsy suggests a microscopic colitis, even perhaps  Crohn's disease, although I doubt that.  I am not sure why there were  granulomas there.  He is not really better with the Entocort EC.   PLAN:  1. Discontinue the Entocort.  2. Trial of Xifaxan 400 mg t.i.d. for bacterial overgrowth.  The      patient was warned that this is an off-label indication, and we      will see what happens with his insurance on that.  Other      antibiotics could be used.  If that fails to work, regular use of      Imodium may be needed, would like to try to come up with a regimen      so he has a good trip to [place] in the fall.  I would be hesitate      to use prednisone in this man, given his diabetes and the overall      findings.  Cholestid is another  possible option.   NOTE:  He will call me with the results, and followup will be arranged  after that.     Gatha Mayer, MD,FACG  Electronically Signed    CEG/MedQ  DD: 03/05/2007  DT: 03/05/2007  Job #: [id number]   cc:   Janalyn Rouse, M.D.  Ronaldo Miyamoto, M.D.

## 2010-12-27 NOTE — H&P (Signed)
NAME:  [name], [name] NO.:  [id number]   MEDICAL RECORD NO.:  [id number]          PATIENT TYPE:  INP   LOCATION:  5025                         FACILITY:  [facility]   PHYSICIAN:  Janalyn Rouse, M.D.  DATE OF BIRTH:  1940/10/05   DATE OF ADMISSION:  10/29/2007  DATE OF DISCHARGE:                              HISTORY & PHYSICAL   CHIEF COMPLAINT:  Nausea, vomiting, diarrhea.   HISTORY OF PRESENT ILLNESS:  Mr. Monteiro is a 70 year old [place] male with  history of type 2 diabetes, hypertension, and asthma who presented to  the office today with nausea and vomiting and diarrhea times 2 days.  The patient has had a recent prolonged course of bronchitis which has  been treated with two rounds of antibiotics.  He was initially treated  with Avelox from February 16 through February 23 followed by Sterapred  taper from February 24 to February 30.  Given persistent, in fact  worsening symptoms, he was given an additional 7 days of Levaquin 500 mg  daily from March 3 through March 10.  He felt like he was actually  improving following this until March 15 when he developed acute onset  nausea, vomiting and diarrhea.  He states that this developed about 12  hours after eating a buffet meal in [place].  He has had profuse  watery diarrhea of about 30 episodes since that time that has  discontinued with 12 episodes today.  He has continued to have nausea  and vomiting with clear food tinged emesis.  No bilious emesis or  hematemesis.  He has not had any blood in his stools.  He has had  subjective fever.  No other recent travel.  He was actually seen in the  emergency room last evening for these symptoms and was given 4 liters of  IV fluids and a shot of Phenergan with minimal improvement.  He was  discharged home with no prescriptions.  Given his inability to keep  liquids down, he represented to the office today and requires admission  for dehydration.  In fact, he has lost 14  pounds since his last visit on  October 15, 2007.   PAST MEDICAL HISTORY:  1. Type 2 diabetes.  2. Obstructive sleep apnea status post UPPP on CPAP  3. Depression/anxiety.  4. Hypertension.  5. Hyperlipidemia.  6. Asthma.  7. BPH.  8. Erectile dysfunction.   CURRENT MEDICATIONS:  1. Altace 10 mg daily.  2. Diovan 80 mg daily.  3. Lipitor 20 mg daily.  4 . Wellbutrin XL 3 mg daily.  1. Zoloft 100 mg daily.  2. Fortamet 1000 mg b.i.d.  3. Advair 250/50 b.i.d.  4. Combivent b.i.d.  5. Aspirin 81 mg daily.  6. Xanax 0.5 mg p.r.n.  7. Ambien 10 mg q.h.s. p.r.n.  8. Fish oil t.i.d.  9. Viagra 20 mg p.r.n.  10.Flomax daily.  11.Tylenol p.r.n.   ALLERGIES:  MYCINS CAUSE GI UPSET.  SHELLFISH.   SOCIAL HISTORY:  He is married with one daughter and two grandchildren.  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  No tobacco or  drug use.  Occasional alcohol.   FAMILY HISTORY:  Father died at 54 of complications from hip fracture.  Mother died 25 of heart failure.  He has one brother who had coronary  disease and a sister who is healthy.   REVIEW OF SYSTEMS:  All systems reviewed and negative except as in HPI.   PHYSICAL EXAMINATION:  VITAL SIGNS:  Temperature 99.1, blood pressure  122/80, pulse 92, weight 211 pounds which is down 14 pounds.  GENERAL:  Pale-appearing, washed out, fatigued.  HEENT: Oropharynx is mildly dry.  NECK:  Supple without lymphadenopathy, JVD or carotid bruits.  HEART:  Regular rate and rhythm without murmurs, rubs or gallops.  LUNGS:  Clear to auscultation bilaterally.  No wheezing appreciated.  Abdomen:  Soft, nondistended, nontender with normoactive bowel sounds.  No clubbing, cyanosis or edema.  NEUROLOGICAL:  Alert and oriented x4.   LABORATORY DATA:  CBC and C-met obtained in the office today are  pending.  Last evening his Persing blood count was 11.7.  C-met was  significant for a BUN of 30 and a creatinine of 1.4.    ASSESSMENT/PLAN:  1. Nausea, vomiting and diarrhea.  Differential diagnosis includes C      diff colitis due to recent antibiotic therapy versus viral      gastroenteritis versus food borne illness.  Will admit for IV      hydration, IV antiemetics, and Kaopectate as needed.  Obtain C diff      toxin, stool O&P, and stool cultures.  Will treat empirically with      Flagyl for C diff pending the results of the studies.  He is at      high risk for this given his repeated antibiotic courses (we did      discuss this risk day during his recent visits).  2. Dehydration secondary to #1.  Will to obtain a C-met.  Monitor for      renal insufficiency with his ACE inhibitor and ARB.  3. Hypertension.  Continue medications.  Will hold his ACE and ARB if      his creatinine increases.  4. Type 2 diabetes.  Continue Fortamet.  NovoLog sliding scale insulin      q.a.c. and q.h.s..  5. Disposition.  Anticipate discharge to home once he is tolerating      p.o. diet with resolution of his dehydration.      Janalyn Rouse, M.D.  Electronically Signed     WS/MEDQ  D:  10/29/2007  T:  10/30/2007  Job:  [id number]   cc:   Ronaldo Miyamoto, M.D.

## 2010-12-27 NOTE — Discharge Summary (Signed)
NAME:  [name], [name]                ACCOUNT NO.:  [id number]   MEDICAL RECORD NO.:  [id number]          PATIENT TYPE:  INP   LOCATION:  [id number]                         FACILITY:  [facility]   PHYSICIAN:  Janalyn Rouse, M.D.  DATE OF BIRTH:  10-18-1940   DATE OF ADMISSION:  10/29/2007  DATE OF DISCHARGE:  11/04/2007                               DISCHARGE SUMMARY   DISCHARGE DIAGNOSES:  1. Nausea/vomiting/diarrhea, likely viral gastroenteritis versus toxin      negative Clostridium difficile colitis, resolved.  2. Right upper lobe community-acquired pneumonia, improving.  3. Thrombocytopenia, resolved.  4. Asthma.  5. Type 2 diabetes.  6. Hypertension.  7. Obstructive sleep apnea.  8. Status post uvulopalatopharyngoplasty on continuous positive airway      pressure at home.  9. Depression/anxiety.  10.Hyperlipidemia.  11.Benign prostatic hypertrophy.  12.Erectile dysfunction.   DISCHARGE MEDICATIONS:  1. Augmentin 875 mg b.i.d. x3 days.  2. Flagyl 500 mg q.8 hours x7 days.  3. Phenergan 25 mg q.6 hours p.r.n. nausea.  4. Altace 10 mg daily.  5. Diovan 80 mg daily.  6. Lipitor 20 mg daily.  7. Bupropion XL 150 mg 3 daily.  8. Fortamet 1000 mg b.i.d.  9. Advair 250/50 1 puff b.i.d.  10.Combivent p.r.n.  11.Aspirin 81 mg daily.  12.Xanax 0.5 mg p.r.n.  13.Ambien 10 mg every night p.r.n.  14.Fish oil 3 daily.  15.Flomax 0.4 mg every night.  16.Viagra as needed.   HOSPITAL PROCEDURES:  1. CT of the chest patchy infiltrate of the right lobe and to a less      extent right lower lobe (October 31, 2007).  2. CT of the abdomen and pelvis with contrast (October 31, 2007).  3. Negative CT of the abdomen with exception of gallstones dependent      within the gallbladder.   HISTORY OF PRESENT ILLNESS:  For full details, please see dictated  history and physical.  Briefly, [name] is a 70 year old [place] male  with a history of type 2 diabetes, hypertension, and asthma, who  presents to  the office on October 29, 2007, with nausea/vomiting/diarrhea  for 2 days.  The patient has had a complicated course recently with  recurrent asthmatic bronchitis, which was treated from September 30, 2007, through October 07, 2007, with Avelox followed by a Sterapred  taper.  He had persistent symptoms, which were worsening and he was  given an additional 7 days of Levaquin 500 mg daily from October 15, 2007,  through October 22, 2007.  Of note, chest x-rays obtained on these dates  showed no signs of  infiltrate.  He was slowly improving with this when  he developed acute onset of nausea, vomiting, and diarrhea 2 days prior  to presentation.  This began 12 hours after eating at a buffet  restaurant in [place].  None of the other family members who ate there  became sick, but he developed perfused watery diarrhea, which he  described as 30 episodes within 24-hour period.  He did have nausea and  vomiting with food tinged emesis with no bilious or  hematemesis.  He  came to the emergency room on the evening prior to admission and was  discharged after receiving some fluid hydration.  However, he was unable  to keep fluids down at home and required admission for dehydration.   HOSPITAL COURSE:  The patient was admitted to a medical bed.  He was  placed empirically on Flagyl to cover C. difficile.  C. difficile toxin  was obtained x3 and were negative.  However, given his high risk of  exposure due to recent recurrent antibiotic use, he was continued on  Flagyl.  He also received IV fluids and scheduled antiemetics with  resolution of his nausea and vomiting rapidly and resolution of his  diarrhea within several days.  However, he continued to have fever up to  102 on the day following admission.  Chest and acute abdominal series  performed did show air-fluid levels and nondistended bowel with  nonspecific patchy right upper lobe infiltrate, which necessitated  followup.  Therefore, on October 31, 2007 a CT of the chest, abdomen, and  pelvis was obtained that showed hazy right upper lobe infiltrate  consistent with pneumonia and minimal right lower lobe patchy  infiltrate.  Therefore, he was placed on Zosyn to treat resistant  community-required pneumonia and possible aspiration in the setting of  his nausea and vomiting.  With the Zosyn, his fever resolved and his  cough improved.  He continued to have mild shortness of breath.  Repeat  chest x-ray showed no residual infiltrate and no signs of pulmonary  edema.  With continued antibiotic therapy, in addition to his home  Advair and bronchodilator therapy, his breathing improved and is at this  point approaching baseline.  He is remained afebrile for several days  and is felt stable for discharge home on p.o. antibiotic therapy.  Given  response to Zosyn, he will continue on Augmentin as an alternative.  He  has failed 2 rounds of quinolone therapy which may indicate pneumonia  resistant to quinolones or a new event.   The patient's diabetes remained well controlled throughout this  hospitalization.  His metformin was held after his CT scan due to  contrast reaction and he was covered with sliding scale insulin.  His  blood pressure likewise remained well controlled.  Of note, he did have  transient thrombocytopenia with platelets count down to 120, but those  have returned to normal and are up to 154 on the day of discharge.  This  has likely resulted his acute illness.  He did not receive any Lovenox  during this hospitalization due to his thrombocytopenia.   At this point, the patient is stable for discharge home.  He is  tolerating a full diet with no nausea, vomiting, or diarrhea.  His cough  and shortness of breath have improved.  He is tolerating p.o. and will  be transitioned to p.o. antibiotics.   DISCHARGE LABS:  CBC shows a Kerkman count of 6.1, hemoglobin 12.9,  platelets 154.  Urinary Legionella antigen negative.   Stool cultures,  O&P, and C. difficile negative x3.  On the day prior to discharge, BMET  showed a sodium of 142, potassium 3.3, chloride 107, bicarb 27, BUN 8,  creatinine 0.3, glucose 116.  AST was mildly evaluated at 58 and ALT of  45.   DISCHARGE DIET:  Cardiac prudent and diabetic diet.   DISCHARGE INSTRUCTIONS:  He was instructed to call if he has any further  nausea, vomiting, or diarrhea.  He is to call if he has fever or  worsening shortness of breath.   HOSPITAL FOLLOWUP:  He will follow up with Dr. Brigitte Pulse as scheduled for his  annual physical and will call if followup is needed prior to that time.   DISPOSITION:  Home.      Janalyn Rouse, M.D.  Electronically Signed     WS/MEDQ  D:  11/04/2007  T:  11/05/2007  Job:  [id number]   cc:   Gatha Mayer, MD,FACG  Ronaldo Miyamoto, M.D.

## 2010-12-27 NOTE — Assessment & Plan Note (Signed)
[facility] OFFICE NOTE   NAME:[name], [name]                         MRN:          [id number]  DATE:01/02/2007                            DOB:          1941/04/25    Patient is self-referred.   CHIEF COMPLAINT:  Diarrhea, change in bowels.   HISTORY:  This is a 70 year old [place] man that has had problems for six  months, or so, with alternating diarrhea and constipation, but mainly  diarrhea.  He has bloating and gaseousness.  It probably started when he  went on Zoloft, though he is not sure.  He also has heartburn and  indigestion symptoms, but neither AcipHex nor Nexium has really helped  significantly.  He is trying to avoid fried foods, but he gets  epigastric discomfort, burning and indigestion when he eats many  different foods.  He is not describing any bleeding.  See my medical  history form for full details otherwise.   MEDICATIONS:  Are listed and reviewed in the chart.  They include  Advair, Singulair, Lipitor, Altace, Diovan, aspirin, Wellbutrin, Zoloft,  Nexium, Fortamet and Flomax.  He uses Combivent p.r.n., Ambien p.r.n.  and Xanax p.r.n.   ALLERGIES:  He is allergic to [facility], as they have caused diarrhea, so  that is really a sensitivity.   PAST MEDICAL HISTORY:  Hypertension, obesity, asthma/emphysema, COPD on  previous chest x-ray.  He saw Dr. Gwenette Greet a year ago for dyspnea on  exertion.  Anxiety and depression, sleep apnea, allergic rhinosinusitis,  diabetes mellitus.  He had a hernia repair in 2005.  Sounds like it was  an umbilical hernia.  He has had surgery for sleep apnea.   FAMILY HISTORY:  Positive for heart disease, no colon cancer.   SOCIAL HISTORY:  The patient is married.  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  He has a master's degree.  He has one  daughter.  I remember [name] when he called on me as a resident at [facility].  No alcohol,  tobacco or drugs.   REVIEW OF SYSTEMS:  See my medical history form.  Still positive for  dyspnea, cough, allergies, joint pain, insomnia, eyeglasses.   NOTE:  He is looking forward to a trip to [place] with his wife in the  fall.  He would like to solve some of these bowel habit changes prior to  then.   PHYSICAL EXAM:  Height 5 feet 9, weight 213 pounds, blood pressure  116/64, pulse 64.  EYES:  Anicteric.  ENT:  Normal mouth and pharynx.  NECK:  Supple, without mass.  CHEST:  Clear.  HEART:  S1, S2, no murmurs or gallops.  ABDOMEN:  Shows a midline diastasis recti, some increased tympany in the  right upper quadrant, without tenderness.  There is no organomegaly or  mass.  LYMPHATIC:  No neck, supraclavicular or groin adenopathy.  EXTREMITIES:  No edema.  SKIN:  No rash.  PSYCHIATRIC:  He is alert and oriented times three.   I have reviewed Dr. Janifer Adie notes.   NOTE:  The patient had a sigmoidoscopy about five years ago with Dr.  Melynda Ripple in the [facility], apparently negative.   ASSESSMENT:  Change in bowel habits, mainly diarrhea, some constipation.  A lot of bloating and gaseousness.  He is 65.  A colonoscopy would be  appropriate anyway, but we should investigate these symptoms.  Perhaps  Zoloft is causing these.  He will speak to his psychiatrist because he  thinks it has not really been effective anyway and he is tired all the  time on that.   Further plans pending the colonoscopy.  Risks, benefits and indications  have been explained.  He understands and agrees to proceed.  We will  hold his Fortamet the day of the procedure.   NOTE:  He has this epigastric pain and indigestion and he has not  responded to two different proton pump inhibitors.  That makes the  diagnosis of reflux questionable.  We will perform an abdominal  ultrasound, looking for gallstones.     Gatha Mayer, MD,FACG  Electronically Signed    CEG/MedQ  DD: 01/02/2007  DT:  01/02/2007  Job #: [id number]   cc:   Ronaldo Miyamoto, M.D.  Janalyn Rouse, M.D.

## 2010-12-27 NOTE — Assessment & Plan Note (Signed)
[facility] OFFICE NOTE   NAME:WHITESanjit, Sciandra                         MRN:          [id number]  DATE:05/23/2007                            DOB:          1940-09-29    PROBLEM:  Diarrhea.   HISTORY:  Mr. Nuzzi is a pleasant 70 year old [place] male known to Dr.  Carlean Purl, who has recently undergone evaluation for chronic diarrhea.  He  had a colonoscopy in June 2008 that showed chronic inflammatory changes  and rare granulomas suggestive of a microscopic colitis, though could  not entirely rule out Crohn's.  He had been treated initially with a  course of Entocort, perhaps took that for a month without any definite  benefit and it was discontinued.  He was treated with a course of  Xifaxan 400 t.i.d. for 10 days in July 2008, which he definitely felt  helped quite a bit for several weeks.  He is again having worsening  problems with his diarrhea over the past 3 weeks, usually having 4-5  bowel movements per day and occasionally additionally having nocturnal  episodes of diarrhea.  His stools are loose to watery, sometimes  containing mucus, but no blood.  He has a lot of urgency postprandially  as well and some mild lower abdominal cramping.  He has not had any  fever or chills, has some vague nausea at time, no vomiting.  His  appetite has been fairly good and he has actually gained weight.  He has  taken Imodium on occasion, which he does find helpful, but has not been  using it daily.  He comes back in today because he is leaving for [place]  on October 26 for his 25th wedding anniversary trip and wants to have a  plan in order and his diarrhea under control if possible.   CURRENT MEDICATIONS:  1. Advair 500/50 b.i.d.  2. Singulair 20 daily.  3. Altace 10 daily.  4. Diovan 80 daily.  5. Aspirin 81 mg daily.  6. Nexium 40 daily.  7. Fortamet 1000 mg b.i.d.  8. Flomax 20 daily.  9. Wellbutrin XL 300 daily.  10.Lipitor 10 mg daily.   ALLERGIES:  MYCINS causes diarrhea.   PHYSICAL EXAMINATION:  A well-developed [place] male in no acute distress.  Weight is 212, blood pressure 112/74, pulse is 68.  CARDIOVASCULAR:  Regular rate and rhythm with S1 and S2, no murmur, rub  or gallop.  PULMONARY:  Clear to A&P.  ABDOMEN:  Soft and basically nontender.  There is no mass or  hepatosplenomegaly, no guarding or rebound.  RECTAL:  Exam not done.   IMPRESSION:  A 70 year old male with probable microscopic colitis with  diarrhea exacerbation.   PLAN:  1. Treat with another course of Xifaxan 400 t.i.d. for 10 days.  2. Start Imodium b.i.d.  He was advised that he could increase the      dose to q.i.d. if necessary.  3. We will discuss giving him another trial of Entocort or low-dose      prednisone with Dr. Carlean Purl and if indicated, would institute prior  to his [place] trip.      Nicoletta Ba, PA-C  Electronically Signed      Lowella Bandy. Olevia Perches, MD  Electronically Signed   AE/MedQ  DD: 05/23/2007  DT: 05/24/2007  Job #: [id number]

## 2010-12-30 NOTE — H&P (Signed)
NAME:  [name], [name] NO.:  [id number]   MEDICAL RECORD NO.:  [id number]          PATIENT TYPE:  INP   LOCATION:  0104                         FACILITY:  [facility]   PHYSICIAN:  Janalyn Rouse, M.D.  DATE OF BIRTH:  09-13-1940   DATE OF ADMISSION:  04/10/2006  DATE OF DISCHARGE:                                HISTORY & PHYSICAL   PRIMARY CARE PHYSICIAN:  Dr. Marton Redwood.   ENDOCRINOLOGIST:  Dr. Wilson Singer.   PULMONOLOGIST:  Dr. Gwenette Greet.   CHIEF COMPLAINT:  Abdominal pain.   HISTORY OF PRESENT ILLNESS:  [name] is a pleasant 70 year old [place] male  with a history of type 2 diabetes, hypertension, hyperlipidemia who  presented to the emergency department today with a sudden onset of upper  abdominal pain.  The patient reports a fatty meal at [employer] last  evening.  He went to church gathering and after returning home developed  sudden onset, sharp, mid epigastric pain associated with nausea and vomiting  x1.  He describes the pain as severe, sharp pain present throughout his  abdomen, most prominent in his epigastrium.  Does not radiate.  Onset was  around 10:30 last evening and was present throughout the night.  He  presented to the emergency department around 4:00 a.m. where the pain has  been slightly improved with morphine and Zofran.  He did vomit one time with  some green-colored emesis.  The patient does have some recent loose stools  and a history of her GERD with intermittent symptoms.  He states that he has  never had pain this severe.  He has been exercising more rigorously recently  and denies any chest pain with exertion.  He has asthma and reports that his  breathing is stable with mild dyspnea on exertion.   REVIEW OF SYSTEMS:  All systems reviewed with the patient are negative  except as in HPI.   PAST MEDICAL HISTORY:  1. Type 2 diabetes with an A1c of 5.8 in May 2007 followed by Dr. Wilson Singer.  2. Hypertension.  3. Hyperlipidemia.  4.  Asthma.  5. Obstructive sleep apnea status post UPPP on CPAP.  6. Depression/anxiety.  7. BPH.  8. Obesity/metabolic syndrome.  9. Status post abdominal hernia repair.  10.Negative stress test 2 years ago.   MEDICATIONS:  1. Altace 10 mg daily.  2. Diovan 80 mg daily.  3. Lipitor 20 mg daily.  4. Wellbutrin XL 300 mg daily.  5. Fortamet 1000 mg b.i.d.  6. Advair 500/50 b.i.d.  7. Combivent b.i.d.  8. Aspirin 81 mg daily.  9. Xanax 0.5 mg p.r.n.  10.Ambien 10 mg q.h.s.  11.Fish oil.  12.Viagra p.r.n.  13.Flomax 0.4 mg daily.   ALLERGIES:  MYCINS CAUSING GI UPSET.   SOCIAL HISTORY:  He is married with one daughter and two grandchildren.  He  is a retired Arts development officer.  No tobacco history.  Occasional  alcohol.   FAMILY HISTORY:  Father died at the age of 40 of old age.  Mother died of  heart failure at 32.  Brother had coronary disease  and had a PCI.  His  sister is healthy.   PHYSICAL EXAMINATION:  VITAL SIGNS:  Temperature 97.5, blood pressure  158/88, pulse 54, respirations 22, oxygen saturation 99% on room air.  GENERAL:  Drowsy due to medications.  He is in no acute distress.  Appears  to be resting comfortably.  He is pale appearing.  HEENT:  Pupils are equal, round, and reactive to light.  No scleral icterus.  Oropharynx is dry.  NECK:  Supple without lymphadenopathy, JVD or carotid bruits.  HEART:  Regular rate and rhythm without murmurs, rubs or gallops.  LUNGS:  Minimal left basilar crackles.  ABDOMEN:  Soft, nondistended with normoactive bowel sounds.  He does have  diffuse tenderness which is most prominent in the epigastrium.  No rebound  or guarding.  EXTREMITIES:  No clubbing, cyanosis or edema.  NEUROLOGIC:  Drowsy with a nonfocal exam.   LABORATORY DATA:  CBC shows a Gilberg count of 9.5, hemoglobin 15.4, platelets  188.  BMET significant for sodium 134, potassium 4.1, chloride 103, bicarb  25, BUN 24, creatinine 1.4, glucose 148.  Troponin less  than 0.05.  Normal  MB.  Liver function tests were normal.  Lipase 27.   Studies personally reviewed:  1. EKG shows normal sinus rhythm with an incomplete left bundle branch      block.  No significant ST changes.  2. Chest x-ray shows mild vascular congestion with no acute      cardiopulmonary disease.   ASSESSMENT/PLAN:  1. Abdominal pain - he has had prolonged pain with character and location      and temporal relationship to food most consistent with a      gastrointestinal etiology.  This is most likely gastritis or      gastroenteritis.  Less likely gallbladder disease as his liver function      tests are normal, and he has no fever or leukocytosis.  I doubt that      this is cardiac etiology given the nonacute EKG, normal enzymes, and      prolonged pain.  Will rule out MI with serial enzymes and monitor on      telemetry.  Obtain a CT of the abdomen and pelvis to further      characterize with consideration of right upper quadrant ultrasound if      this is unrevealing and his pain persists.  2. Hypertension - slightly elevated on admission.  Will continue his home      medications with close monitoring.  3. Type 2 diabetes, controlled - will hold his metformin.  Cover with      sliding scale insulin.  4. Asthma - continue Advair, albuterol, Atrovent as needed.  5. Disposition - possible discharge in the next 1-2 days if his enzymes      are negative and his pain resolves with no acute findings on his CT      scan.      Janalyn Rouse, M.D.  Electronically Signed     WS/MEDQ  D:  04/10/2006  T:  04/10/2006  Job:  [id number]
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
## 2010-12-30 NOTE — Assessment & Plan Note (Signed)
[place]                               PULMONARY OFFICE NOTE   NAME:[name], [name]                         MRN:          [id number]  DATE:02/12/2006                            DOB:          06/01/1941    HISTORY OF PRESENT ILLNESS:  The patient is a 70 year old gentleman who I  have been asked to see for shortness of breath and asthma.  The patient  states that he had asthma as a child and has been having breathing issues  for the last 20 years.  He has been followed by Dr. Delilah Shan at the [facility] and has been on various inhalers over the years.  He currently is on  Advair and Singulair for his asthma but does not take the Advair on a  regular basis.  The patient feels that his breathing issues have become more  of a problem since May.  He has no difficulties with shortness of breath in  terms of his activities of daily living, but is becomes more of an issue  when he tries to exercise or increase his level of activity.  He admits to  approximately one-half mile dyspnea on exertion at a moderate pace on flat  ground.  He has a cough at times that is primarily dry.  He also complains  of nocturnal chest tightness with increased shortness of breath which is  better with p.r.n. albuterol.  This has been more of an issue the past few  weeks.  The patient has a history of gastroesophageal reflux disease and is  on Aciphex but is continuing to have breakthrough symptoms.  It should also  be noted that he is on an ACE inhibitor.  The patient has no history of  heart disease but has not had a cardiac evaluation recently.  Of note, his  weight has decreased about 10 pounds over the last one year.   PAST MEDICAL HISTORY:  1.  Hypertension.  2.  History of asthma.  3.  History of diabetes.  4.  History of allergic rhinitis.  5.  History of sleep apnea for which he is on CPAP.  6.  History of UP3 for his sleep apnea prior to CPAP initiation.   CURRENT MEDICATIONS:  1.  Advair 500/50 p.r.n.  2.  Singulair 20 mg daily.  3.  Lipitor 20 mg daily.  4.  Altace 10 mg daily.  5.  Diovan 80 mg daily.  6.  Aspirin 81 mg daily.  7.  Wellbutrin 450 mg daily.  8.  Zoloft 100 mg daily.  9.  Aciphex 20 mg daily.  10. Combivent p.r.n.   ALLERGIES:  NO KNOWN DRUG ALLERGIES.   SOCIAL HISTORY:  He is married and has children.  He has never smoked.  He  is retired from [employer].   FAMILY HISTORY:  Totally noncontributory.   REVIEW OF SYSTEMS:  As per history of present illness.  Also see patient  intake form documented in the chart.   PHYSICAL EXAMINATION:  GENERAL:  He is an obese male in  no acute distress.  VITAL SIGNS:  Blood pressure 110/80, pulse 67, temperature 98, weight 219  pounds, O2 saturation on room air is 96%.  HEENT:  Pupils equal, round, reactive to light and accommodation.  Extraocular muscles are intact.  Nares show crusting on the right.  The left  is patent.  Oropharynx shows a trim palate and uvula with still persistent  tissue redundancy.  NECK:  Supple without JVD or lymphadenopathy.  There is no palpable  thyromegaly.  CHEST:  Totally clear.  CARDIAC:  Regular rate and rhythm.  No murmurs, rubs, or gallops.  ABDOMEN:  Soft, nontender, with good bowel sounds.  GENITAL, RECTAL, BREASTS:  Exams not done, not indicated.  EXTREMITIES:  Lower extremities are without edema.  Good pulses distally.  No calf tenderness.  NEUROLOGIC:  He is alert and oriented with no motor deficits.   IMPRESSION:  Dyspnea on exertion which may be multifactorial.  The patient  has a history of asthma and is on a very good medical regimen; however, he  has been noncompliant with daily inhaled corticosteroid use.  I think the  first step would be get him back on the Advair on a consistent basis.  I  would also like to get the records from Dr. Delilah Shan to see how the diagnosis  of asthma was made.  His most recent chest  x-ray was totally normal, but I  think it would be worth getting full pulmonary function tests to see if  there is a component of restriction from his obesity or possibly diffusion  capacity of carbon monoxide abnormalities from other issues.  I also think  there may be a component of gastroesophageal reflux  disease/laryngopharyngeal reflux contributing to his nocturnal symptoms.  The patient is clearly having breakthrough on once a day Aciphex.  Finally,  the patient is having a mild cough and is on an angiotensin-converting  enzyme inhibitor.  This may be contributing to his upper airway instability.   SUGGESTIONS:  1.  I have asked the patient to start using the Advair on a very consistent      basis.  2.  Will obtain records from Dr. Scarlette Ar office at the [facility].  3.  Will schedule for full PFTs.  4.  Increase Aciphex to b.i.d. dosing.  5.  Would consider getting the patient off of an ACE inhibitor because of      its impact on the upper airway and also his pulmonary symptomatology.      The ACE inhibitor may not be      causing his cough but certainly will add to our diagnostic dilemmas as      we move forward.  6.  The patient will followup after the above is done.                                   Kathee Delton, MD, FCCP   [facility]/MedQ  DD:  02/27/2006  DT:  02/27/2006  Job #:  [id number]   cc:   Janalyn Rouse, MD

## 2011-01-27 ENCOUNTER — Encounter: Payer: Self-pay | Admitting: Internal Medicine

## 2011-01-27 ENCOUNTER — Ambulatory Visit (INDEPENDENT_AMBULATORY_CARE_PROVIDER_SITE_OTHER): Payer: Medicare Other | Admitting: Internal Medicine

## 2011-01-27 DIAGNOSIS — Z8719 Personal history of other diseases of the digestive system: Secondary | ICD-10-CM

## 2011-01-27 DIAGNOSIS — R197 Diarrhea, unspecified: Secondary | ICD-10-CM

## 2011-01-27 MED ORDER — PEG-KCL-NACL-NASULF-NA ASC-C 100 G PO SOLR: 1.0000 | Freq: Once | ORAL | Status: DC
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
## 2011-01-27 NOTE — Assessment & Plan Note (Signed)
Question if this persists. Await colonoscopy with repeat biopsies regardless of the appearance of the mucosa.

## 2011-01-27 NOTE — Assessment & Plan Note (Addendum)
This is still a problem. It did not respond to colestipol. He is using some Imodium though he waits until he has multiple bowel movements there is partial relief. Pepto-Bismol has not helped at all. He can try a regular dose of Imodium on a daily basis and given the previous changes of microscopic colitis I think it's reasonable to pursue a repeat colonoscopy with random biopsies. Further plans pending that. He understands the risks benefits indications and agrees to keep. Will review labs he has had primary care physician office.  Labs from Fall 2011 show cbc, cmet ok except creatinine 1.6 TSH was low at 0.18 in July 2011 but Ft4 and total T3 were ok

## 2011-01-27 NOTE — Patient Instructions (Addendum)
You have been scheduled for a Colonoscopy with separate instructions given. Your prep kit has been sent to your pharmacy for you to pick up. Try taking Imodium A-D on a daily regular basis to see if that helps your diarrhea.

## 2011-02-12 HISTORY — PX: COLONOSCOPY W/ BIOPSIES: SHX1374

## 2011-02-14 ENCOUNTER — Ambulatory Visit (AMBULATORY_SURGERY_CENTER): Payer: Medicare Other | Admitting: Internal Medicine

## 2011-02-14 ENCOUNTER — Encounter: Payer: Self-pay | Admitting: Internal Medicine

## 2011-02-14 VITALS — BP 126/83 | HR 77 | Temp 98.8°F | Resp 22 | Ht 69.0 in | Wt 198.0 lb

## 2011-02-14 DIAGNOSIS — R197 Diarrhea, unspecified: Secondary | ICD-10-CM

## 2011-02-14 DIAGNOSIS — K5289 Other specified noninfective gastroenteritis and colitis: Secondary | ICD-10-CM

## 2011-02-14 DIAGNOSIS — Z8719 Personal history of other diseases of the digestive system: Secondary | ICD-10-CM

## 2011-02-14 DIAGNOSIS — R933 Abnormal findings on diagnostic imaging of other parts of digestive tract: Secondary | ICD-10-CM

## 2011-02-14 LAB — GLUCOSE, CAPILLARY: Glucose-Capillary: 111 mg/dL — ABNORMAL HIGH (ref 70–99)

## 2011-02-14 MED ORDER — SODIUM CHLORIDE 0.9 % IV SOLN: 500.0000 mL | INTRAVENOUS | Status: DC

## 2011-02-14 NOTE — Patient Instructions (Addendum)
Dr. Celesta Aver office will call with biopsy results and plans. Continue with Imodium until then.  Discharge instructions explained and given to pt and care partner.

## 2011-02-16 ENCOUNTER — Telehealth: Payer: Self-pay | Admitting: *Deleted

## 2011-02-16 NOTE — Telephone Encounter (Signed)
No answer and no identifier on message machine. No message left for the patient

## 2011-02-21 ENCOUNTER — Telehealth: Payer: Self-pay | Admitting: Internal Medicine

## 2011-02-21 DIAGNOSIS — K5010 Crohn's disease of large intestine without complications: Secondary | ICD-10-CM

## 2011-02-21 MED ORDER — MESALAMINE 800 MG PO TBEC: 2.0000 | DELAYED_RELEASE_TABLET | Freq: Three times a day (TID) | ORAL | Status: DC

## 2011-02-21 NOTE — Progress Notes (Signed)
Quick Note:  Possible Crohn's Will start mesalamine Recall colonoscopy in 5 years [facility] to enet - no letter See telephone note re Rx and follow-up ______

## 2011-02-21 NOTE — Telephone Encounter (Signed)
Need to star Asacol for mild IBD on biopsies - looks like Crohn's colitis

## 2011-02-22 ENCOUNTER — Telehealth: Payer: Self-pay | Admitting: Internal Medicine

## 2011-02-22 ENCOUNTER — Encounter: Payer: Self-pay | Admitting: Internal Medicine

## 2011-02-22 NOTE — Telephone Encounter (Signed)
Patient advised.  I have scheduled a follow up appt with Dr Carlean Purl for 04/03/11

## 2011-02-22 NOTE — Telephone Encounter (Signed)
That is acceptable. If the diarrhea returns he should call us  The diagnosis is not certain
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
## 2011-02-22 NOTE — Telephone Encounter (Signed)
Patient advised he will call back if diarrhea returns.

## 2011-03-19 DIAGNOSIS — N529 Male erectile dysfunction, unspecified: Secondary | ICD-10-CM | POA: Insufficient documentation

## 2011-04-03 ENCOUNTER — Telehealth: Payer: Self-pay | Admitting: Gastroenterology

## 2011-04-03 ENCOUNTER — Encounter: Payer: Self-pay | Admitting: Internal Medicine

## 2011-04-03 ENCOUNTER — Ambulatory Visit (INDEPENDENT_AMBULATORY_CARE_PROVIDER_SITE_OTHER): Payer: Medicare Other | Admitting: Internal Medicine

## 2011-04-03 VITALS — BP 118/76 | HR 72 | Ht 69.0 in | Wt 203.6 lb

## 2011-04-03 DIAGNOSIS — K5289 Other specified noninfective gastroenteritis and colitis: Secondary | ICD-10-CM

## 2011-04-03 DIAGNOSIS — K5010 Crohn's disease of large intestine without complications: Secondary | ICD-10-CM

## 2011-04-03 NOTE — Telephone Encounter (Signed)
Order placed

## 2011-04-03 NOTE — Patient Instructions (Signed)
Call us back if you have persistent diarrhea problems or other gastrointestinal issues. The cause of your problems seems to be a microscopic colitis though that does not mean you will have chronic issues. The good news is you do not have any symptoms now. I hope that continues. Gatha Mayer, MD, [facility]

## 2011-04-03 NOTE — Assessment & Plan Note (Signed)
No symptoms so we held off on therapy. Microscopic colitis, pathologist raised ? Of Crohn's. Since he is asymptomatic would not treat and do not think he should need a routine colonoscopy for this now, perhaps in future. Not discussed but will plan on a 1 year office visit if not needed to be seen sooner.

## 2011-04-03 NOTE — Telephone Encounter (Signed)
Message copied by Antonieta Pert on Mon Apr 03, 2011  1:35 PM ------      Message from: Silvano Rusk E      Created: Mon Apr 03, 2011  1:29 PM      Regarding: recall office visit 1 year       Place a recall for an office visit 02/2012

## 2011-04-03 NOTE — Progress Notes (Signed)
  He was evaluated with a colonoscopy due to persistent diarrhea, early July. Some patchy erythema and ? Of inflammation. Biopsies showed a microscopic colitis. Called him to start mesalamine but he said diarrhea had stopped so we held off. Still ok with 1 formed stool a day. No other GI complaints unless he eats spicy food will need Pepto Bismol for upset stomach.   Past Medical History  Diagnosis Date  . Microscopic colitis   . Obesity   . HLD (hyperlipidemia)   . Depression   . Anxiety   . Sleep apnea   . HTN (hypertension)   . COPD (chronic obstructive pulmonary disease)   . DM (diabetes mellitus)   . BPH (benign prostatic hypertrophy)   . Asthma   . ED (erectile dysfunction)   . Pneumonia 3/09  . Diverticulosis 01/2007  . Internal hemorrhoids 01/2007  . Cataract    Past Surgical History  Procedure Date  . Tonsillectomy   . Umbilical hernia repair   . Sleep apnea surgery   . Cholecystectomy   . Colonoscopy w/ biopsies 2008    Looks normal, biopsy suggested a chronic microscopic colitis with rare granulomas  . Nose surgery   . Colonoscopy w/ biopsies 02/2011    diverticulosis, internal hemorrhoids, patchy chronic colitis on random bxs,     reports that he has never smoked. He has never used smokeless tobacco. He reports that he does not drink alcohol or use illicit drugs. family history includes Esophageal cancer in his paternal grandmother.  There is no history of Colon cancer. Allergies  Allergen Reactions  . Shellfish Allergy       Current outpatient prescriptions:albuterol-ipratropium (COMBIVENT) 18-103 MCG/ACT inhaler, Inhale 2 puffs into the lungs as needed.  , Disp: , Rfl: ;  ALPRAZolam (XANAX) 0.5 MG tablet, , Disp: , Rfl: ;  aspirin 81 MG tablet, Take 81 mg by mouth daily.  , Disp: , Rfl: ;  atorvastatin (LIPITOR) 10 MG tablet, Take 10 mg by mouth daily.  , Disp: , Rfl:  Bismuth Subsalicylate (PEPTO-BISMOL PO), Take by mouth. Liquid will use 3-4 times a day ,  Disp: , Rfl: ;  buPROPion (WELLBUTRIN XL) 300 MG 24 hr tablet, Take 300 mg by mouth daily.  , Disp: , Rfl: ;  Calcium Carbonate Antacid (TUMS PO), Take by mouth. 3-4 daily , Disp: , Rfl: ;  CELEBREX 200 MG capsule, , Disp: , Rfl: ;  Fluticasone-Salmeterol (ADVAIR DISKUS) 500-50 MCG/DOSE AEPB, Inhale 1 puff into the lungs 2 (two) times daily.  , Disp: , Rfl:  loperamide (IMODIUM A-D) 2 MG tablet, Take 2 mg by mouth as needed.  , Disp: , Rfl: ;  metformin (FORTAMET) 1000 MG (OSM) 24 hr tablet, Take 1,000 mg by mouth 2 (two) times daily with a meal.  , Disp: , Rfl: ;  Multiple Vitamin (MULTIVITAMIN PO), Take by mouth.  , Disp: , Rfl: ;  ramipril (ALTACE) 10 MG tablet, Take 10 mg by mouth daily.  , Disp: , Rfl: ;  sertraline (ZOLOFT) 100 MG tablet, Take 100 mg by mouth daily.  , Disp: , Rfl:  valsartan (DIOVAN) 80 MG tablet, Take 80 mg by mouth daily.  , Disp: , Rfl: ;  zolpidem (AMBIEN) 10 MG tablet, Take 10 mg by mouth as needed.  , Disp: , Rfl:  Current facility-administered medications:DISCONTD: 0.9 %  sodium chloride infusion, 500 mL, Intravenous, Continuous, Gatha Mayer, MD

## 2011-05-08 LAB — URINALYSIS, ROUTINE W REFLEX MICROSCOPIC
Glucose, UA: NEGATIVE
Hgb urine dipstick: NEGATIVE
Ketones, ur: 15 — AB
Nitrite: NEGATIVE
Protein, ur: NEGATIVE
Specific Gravity, Urine: 1.03
Urobilinogen, UA: 0.2
pH: 5.5

## 2011-05-08 LAB — COMPREHENSIVE METABOLIC PANEL
ALT: 33
ALT: 20
ALT: 22
ALT: 22
ALT: 24
ALT: 45
ALT: 72 — ABNORMAL HIGH
AST: 26
AST: 23
AST: 26
AST: 27
AST: 29
AST: 58 — ABNORMAL HIGH
AST: 88 — ABNORMAL HIGH
Albumin: 4
Albumin: 2.5 — ABNORMAL LOW
Albumin: 2.7 — ABNORMAL LOW
Albumin: 2.7 — ABNORMAL LOW
Albumin: 2.7 — ABNORMAL LOW
Albumin: 2.8 — ABNORMAL LOW
Albumin: 2.9 — ABNORMAL LOW
Alkaline Phosphatase: 73
Alkaline Phosphatase: 40
Alkaline Phosphatase: 41
Alkaline Phosphatase: 43
Alkaline Phosphatase: 44
Alkaline Phosphatase: 46
Alkaline Phosphatase: 52
BUN: 38 — ABNORMAL HIGH
BUN: 10
BUN: 20
BUN: 5 — ABNORMAL LOW
BUN: 6
BUN: 8
BUN: 8
CO2: 21
CO2: 19
CO2: 22
CO2: 22
CO2: 26
CO2: 27
CO2: 30
Calcium: 10.1
Calcium: 7.8 — ABNORMAL LOW
Calcium: 7.9 — ABNORMAL LOW
Calcium: 8.6
Calcium: 8.7
Calcium: 8.8
Calcium: 9.1
Chloride: 108
Chloride: 104
Chloride: 107
Chloride: 108
Chloride: 110
Chloride: 110
Chloride: 113 — ABNORMAL HIGH
Creatinine, Ser: 1.42
Creatinine, Ser: 1.14
Creatinine, Ser: 1.23
Creatinine, Ser: 1.25
Creatinine, Ser: 1.28
Creatinine, Ser: 1.33
Creatinine, Ser: 1.37
GFR calc Af Amer: >60
GFR calc Af Amer: >60
GFR calc Af Amer: >60
GFR calc Af Amer: >60
GFR calc Af Amer: >60
GFR calc Af Amer: >60
GFR calc Af Amer: >60
GFR calc non Af Amer: 50 — ABNORMAL LOW
GFR calc non Af Amer: 52 — ABNORMAL LOW
GFR calc non Af Amer: 54 — ABNORMAL LOW
GFR calc non Af Amer: 57 — ABNORMAL LOW
GFR calc non Af Amer: 58 — ABNORMAL LOW
GFR calc non Af Amer: 59 — ABNORMAL LOW
GFR calc non Af Amer: >60
Glucose, Bld: 182 — ABNORMAL HIGH
Glucose, Bld: 109 — ABNORMAL HIGH
Glucose, Bld: 112 — ABNORMAL HIGH
Glucose, Bld: 116 — ABNORMAL HIGH
Glucose, Bld: 92
Glucose, Bld: 95
Glucose, Bld: 96
Potassium: 3.8
Potassium: 3.2 — ABNORMAL LOW
Potassium: 3.2 — ABNORMAL LOW
Potassium: 3.3 — ABNORMAL LOW
Potassium: 3.5
Potassium: 3.5
Potassium: 3.5
Sodium: 139
Sodium: 135
Sodium: 137
Sodium: 142
Sodium: 142
Sodium: 143
Sodium: 144
Total Bilirubin: 1.2
Total Bilirubin: 0.5
Total Bilirubin: 0.6
Total Bilirubin: 0.6
Total Bilirubin: 0.7
Total Bilirubin: 0.8
Total Bilirubin: 0.9
Total Protein: 7.9
Total Protein: 5.2 — ABNORMAL LOW
Total Protein: 5.3 — ABNORMAL LOW
Total Protein: 5.4 — ABNORMAL LOW
Total Protein: 5.5 — ABNORMAL LOW
Total Protein: 5.7 — ABNORMAL LOW
Total Protein: 5.7 — ABNORMAL LOW

## 2011-05-08 LAB — CULTURE, BLOOD (ROUTINE X 2)
Culture: NO GROWTH
Culture: NO GROWTH

## 2011-05-08 LAB — CBC
HCT: 51.4
HCT: 37.1 — ABNORMAL LOW
HCT: 37.6 — ABNORMAL LOW
HCT: 38.5 — ABNORMAL LOW
HCT: 39.6
HCT: 40.3
HCT: 41.3
Hemoglobin: 16.8
Hemoglobin: 12.4 — ABNORMAL LOW
Hemoglobin: 12.4 — ABNORMAL LOW
Hemoglobin: 12.9 — ABNORMAL LOW
Hemoglobin: 13.2
Hemoglobin: 13.4
Hemoglobin: 14
MCHC: 32.6
MCHC: 32.8
MCHC: 33.3
MCHC: 33.3
MCHC: 33.5
MCHC: 33.5
MCHC: 33.8
MCV: 90.4
MCV: 89.3
MCV: 89.7
MCV: 89.8
MCV: 90
MCV: 90.1
MCV: 90.4
Platelets: 225
Platelets: 120 — ABNORMAL LOW
Platelets: 125 — ABNORMAL LOW
Platelets: 129 — ABNORMAL LOW
Platelets: 140 — ABNORMAL LOW
Platelets: 146 — ABNORMAL LOW
Platelets: 154
RBC: 5.69
RBC: 4.15 — ABNORMAL LOW
RBC: 4.18 — ABNORMAL LOW
RBC: 4.28
RBC: 4.4
RBC: 4.46
RBC: 4.6
RDW: 17.7 — ABNORMAL HIGH
RDW: 17.1 — ABNORMAL HIGH
RDW: 17.2 — ABNORMAL HIGH
RDW: 17.3 — ABNORMAL HIGH
RDW: 17.4 — ABNORMAL HIGH
RDW: 17.5 — ABNORMAL HIGH
RDW: 17.5 — ABNORMAL HIGH
WBC: 11.7 — ABNORMAL HIGH
WBC: 4.1
WBC: 4.3
WBC: 5.2
WBC: 6.1
WBC: 6.4
WBC: 7

## 2011-05-08 LAB — CLOSTRIDIUM DIFFICILE EIA
C difficile Toxins A+B, EIA: NEGATIVE
C difficile Toxins A+B, EIA: NEGATIVE
C difficile Toxins A+B, EIA: NEGATIVE

## 2011-05-08 LAB — STOOL CULTURE

## 2011-05-08 LAB — LACTATE DEHYDROGENASE: LDH: 169

## 2011-05-08 LAB — DIFFERENTIAL
Basophils Absolute: 0
Basophils Relative: 0
Eosinophils Absolute: 0.1
Eosinophils Relative: 1
Lymphocytes Relative: 3 — ABNORMAL LOW
Lymphs Abs: 0.4 — ABNORMAL LOW
Monocytes Absolute: 1.1 — ABNORMAL HIGH
Monocytes Relative: 10
Neutro Abs: 10.1 — ABNORMAL HIGH
Neutrophils Relative %: 87 — ABNORMAL HIGH

## 2011-05-08 LAB — GIARDIA/CRYPTOSPORIDIUM SCREEN(EIA)
Cryptosporidium Screen (EIA): NEGATIVE
Cryptosporidium Screen (EIA): NEGATIVE
Giardia Screen - EIA: NEGATIVE
Giardia Screen - EIA: NEGATIVE

## 2011-05-08 LAB — LEGIONELLA ANTIGEN, URINE: Legionella Antigen, Urine: NEGATIVE

## 2011-05-08 LAB — LIPASE, BLOOD: Lipase: 21

## 2011-05-08 LAB — TECHNOLOGIST SMEAR REVIEW: Path Review: INCREASED

## 2011-06-15 DIAGNOSIS — K219 Gastro-esophageal reflux disease without esophagitis: Secondary | ICD-10-CM | POA: Insufficient documentation

## 2011-08-16 DIAGNOSIS — M25819 Other specified joint disorders, unspecified shoulder: Secondary | ICD-10-CM | POA: Diagnosis not present

## 2011-08-18 ENCOUNTER — Encounter (HOSPITAL_COMMUNITY)
Admission: RE | Admit: 2011-08-18 | Discharge: 2011-08-18 | Disposition: A | Discharge status: Home / Self Care | Payer: Medicare Other | Source: Ambulatory Visit | Attending: Orthopedic Surgery | Admitting: Orthopedic Surgery

## 2011-08-18 ENCOUNTER — Encounter (HOSPITAL_COMMUNITY): Payer: Self-pay

## 2011-08-18 ENCOUNTER — Encounter (HOSPITAL_COMMUNITY): Payer: Self-pay | Admitting: Pharmacy Technician

## 2011-08-18 DIAGNOSIS — I10 Essential (primary) hypertension: Secondary | ICD-10-CM | POA: Diagnosis not present

## 2011-08-18 DIAGNOSIS — E785 Hyperlipidemia, unspecified: Secondary | ICD-10-CM | POA: Diagnosis not present

## 2011-08-18 DIAGNOSIS — Z01811 Encounter for preprocedural respiratory examination: Secondary | ICD-10-CM | POA: Diagnosis not present

## 2011-08-18 DIAGNOSIS — M19019 Primary osteoarthritis, unspecified shoulder: Secondary | ICD-10-CM | POA: Diagnosis not present

## 2011-08-18 DIAGNOSIS — E669 Obesity, unspecified: Secondary | ICD-10-CM | POA: Diagnosis not present

## 2011-08-18 DIAGNOSIS — K219 Gastro-esophageal reflux disease without esophagitis: Secondary | ICD-10-CM | POA: Diagnosis not present

## 2011-08-18 DIAGNOSIS — S43439A Superior glenoid labrum lesion of unspecified shoulder, initial encounter: Secondary | ICD-10-CM | POA: Diagnosis not present

## 2011-08-18 DIAGNOSIS — G4730 Sleep apnea, unspecified: Secondary | ICD-10-CM | POA: Diagnosis not present

## 2011-08-18 DIAGNOSIS — J449 Chronic obstructive pulmonary disease, unspecified: Secondary | ICD-10-CM | POA: Diagnosis not present

## 2011-08-18 DIAGNOSIS — M25819 Other specified joint disorders, unspecified shoulder: Secondary | ICD-10-CM | POA: Diagnosis not present

## 2011-08-18 DIAGNOSIS — M67919 Unspecified disorder of synovium and tendon, unspecified shoulder: Secondary | ICD-10-CM | POA: Diagnosis not present

## 2011-08-18 DIAGNOSIS — Z01812 Encounter for preprocedural laboratory examination: Secondary | ICD-10-CM | POA: Diagnosis not present

## 2011-08-18 HISTORY — DX: Effusion, unspecified joint: M25.40

## 2011-08-18 HISTORY — DX: Unspecified hearing loss, unspecified ear: H91.90

## 2011-08-18 HISTORY — DX: Insomnia, unspecified: G47.00

## 2011-08-18 HISTORY — DX: Unspecified osteoarthritis, unspecified site: M19.90

## 2011-08-18 HISTORY — DX: Personal history of colonic polyps: Z86.010

## 2011-08-18 HISTORY — DX: Gastro-esophageal reflux disease without esophagitis: K21.9

## 2011-08-18 HISTORY — DX: Nocturia: R35.1

## 2011-08-18 HISTORY — DX: Pain in unspecified joint: M25.50

## 2011-08-18 HISTORY — DX: Reserved for inherently not codable concepts without codable children: IMO0001

## 2011-08-18 HISTORY — DX: Personal history of colon polyps, unspecified: Z86.0100

## 2011-08-18 LAB — SURGICAL PCR SCREEN
MRSA, PCR: NEGATIVE
Staphylococcus aureus: NEGATIVE

## 2011-08-18 LAB — CBC
HCT: 46.6 % (ref 39.0–52.0)
Hemoglobin: 15.5 g/dL (ref 13.0–17.0)
MCH: 30.8 pg (ref 26.0–34.0)
MCHC: 33.3 g/dL (ref 30.0–36.0)
MCV: 92.6 fL (ref 78.0–100.0)
Platelets: 199 10*3/uL (ref 150–400)
RBC: 5.03 MIL/uL (ref 4.22–5.81)
RDW: 15.2 % (ref 11.5–15.5)
WBC: 9.5 10*3/uL (ref 4.0–10.5)

## 2011-08-18 LAB — BASIC METABOLIC PANEL
BUN: 19 mg/dL (ref 6–23)
CO2: 28 mEq/L (ref 19–32)
Calcium: 9.9 mg/dL (ref 8.4–10.5)
Chloride: 107 mEq/L (ref 96–112)
Creatinine, Ser: 1.28 mg/dL (ref 0.50–1.35)
GFR calc Af Amer: 65 mL/min — ABNORMAL LOW (ref >90)
GFR calc non Af Amer: 56 mL/min — ABNORMAL LOW (ref >90)
Glucose, Bld: 97 mg/dL (ref 70–99)
Potassium: 4.5 mEq/L (ref 3.5–5.1)
Sodium: 143 mEq/L (ref 135–145)

## 2011-08-18 NOTE — Progress Notes (Signed)
Dr.Shaw at [facility] manages HTN/hyperlipidemia Pt doesn't have a cardiologist  Echo/Stress test done 5+yrs ago   CXR and EKG done less than a year ago and requested from [facility]

## 2011-08-18 NOTE — Pre-Procedure Instructions (Signed)
20 [name]  08/18/2011   Your procedure is scheduled on:  Thurs, Jan 10 @ 1200  Report to [place] at 1000 AM.  Call this number if you have problems the morning of surgery: [phone]   Remember:   Do not eat food:After Midnight.  May have clear liquids: up to 4 Hours before arrival.(until 6 am)  Clear liquids include soda, tea, black coffee, apple or grape juice, broth.  Take these medicines the morning of surgery with A SIP OF WATER: Combivent(Bring your inhaler with you),Xanax,Wellbutrin,Advair,and Zoloft   Do not wear jewelry, make-up or nail polish.  Do not wear lotions, powders, or perfumes. You may wear deodorant.  Do not shave 48 hours prior to surgery.  Do not bring valuables to the hospital.  Contacts, dentures or bridgework may not be worn into surgery.  Leave suitcase in the car. After surgery it may be brought to your room.  For patients admitted to the hospital, checkout time is 11:00 AM the day of discharge.   Patients discharged the day of surgery will not be allowed to drive home.  Name and phone number of your driver:   Special Instructions: CHG Shower Use Special Wash: 1/2 bottle night before surgery and 1/2 bottle morning of surgery.   Please read over the following fact sheets that you were given: Pain Booklet, Coughing and Deep Breathing, MRSA Information and Surgical Site Infection Prevention

## 2011-08-23 MED ORDER — LACTATED RINGERS IV SOLN
INTRAVENOUS | Status: DC
  Administered 2011-08-24: 11:00:00 via INTRAVENOUS

## 2011-08-23 MED ORDER — CHLORHEXIDINE GLUCONATE 4 % EX LIQD: 60.0000 mL | Freq: Once | CUTANEOUS | Status: DC

## 2011-08-23 MED ORDER — CEFAZOLIN SODIUM-DEXTROSE 2-3 GM-% IV SOLR
2.0000 g | INTRAVENOUS | Status: AC
  Administered 2011-08-24: 2 g via INTRAVENOUS
  Filled 2011-08-23: qty 50

## 2011-08-24 ENCOUNTER — Encounter (HOSPITAL_COMMUNITY): Payer: Self-pay | Admitting: *Deleted

## 2011-08-24 ENCOUNTER — Encounter (HOSPITAL_COMMUNITY): Payer: Self-pay

## 2011-08-24 ENCOUNTER — Encounter (HOSPITAL_COMMUNITY)
Admission: RE | Disposition: A | Discharge status: Home / Self Care | Payer: Self-pay | Source: Ambulatory Visit | Attending: Orthopedic Surgery

## 2011-08-24 ENCOUNTER — Ambulatory Visit (HOSPITAL_COMMUNITY): Payer: Medicare Other | Admitting: Anesthesiology

## 2011-08-24 ENCOUNTER — Ambulatory Visit (HOSPITAL_COMMUNITY)
Admission: RE | Admit: 2011-08-24 | Discharge: 2011-08-25 | Disposition: A | Discharge status: Home / Self Care | Payer: Medicare Other | Source: Ambulatory Visit | Attending: Orthopedic Surgery | Admitting: Orthopedic Surgery

## 2011-08-24 ENCOUNTER — Other Ambulatory Visit: Payer: Self-pay

## 2011-08-24 ENCOUNTER — Ambulatory Visit (HOSPITAL_COMMUNITY): Payer: Medicare Other

## 2011-08-24 ENCOUNTER — Encounter (HOSPITAL_COMMUNITY): Payer: Self-pay | Admitting: Anesthesiology

## 2011-08-24 DIAGNOSIS — S43439A Superior glenoid labrum lesion of unspecified shoulder, initial encounter: Secondary | ICD-10-CM | POA: Insufficient documentation

## 2011-08-24 DIAGNOSIS — I10 Essential (primary) hypertension: Secondary | ICD-10-CM | POA: Insufficient documentation

## 2011-08-24 DIAGNOSIS — M7500 Adhesive capsulitis of unspecified shoulder: Secondary | ICD-10-CM | POA: Diagnosis not present

## 2011-08-24 DIAGNOSIS — E785 Hyperlipidemia, unspecified: Secondary | ICD-10-CM | POA: Insufficient documentation

## 2011-08-24 DIAGNOSIS — M24119 Other articular cartilage disorders, unspecified shoulder: Secondary | ICD-10-CM | POA: Diagnosis not present

## 2011-08-24 DIAGNOSIS — M25819 Other specified joint disorders, unspecified shoulder: Secondary | ICD-10-CM | POA: Insufficient documentation

## 2011-08-24 DIAGNOSIS — M67919 Unspecified disorder of synovium and tendon, unspecified shoulder: Secondary | ICD-10-CM | POA: Insufficient documentation

## 2011-08-24 DIAGNOSIS — K219 Gastro-esophageal reflux disease without esophagitis: Secondary | ICD-10-CM | POA: Insufficient documentation

## 2011-08-24 DIAGNOSIS — E669 Obesity, unspecified: Secondary | ICD-10-CM | POA: Insufficient documentation

## 2011-08-24 DIAGNOSIS — G4730 Sleep apnea, unspecified: Secondary | ICD-10-CM | POA: Insufficient documentation

## 2011-08-24 DIAGNOSIS — Z01811 Encounter for preprocedural respiratory examination: Secondary | ICD-10-CM | POA: Diagnosis not present

## 2011-08-24 DIAGNOSIS — M719 Bursopathy, unspecified: Secondary | ICD-10-CM | POA: Diagnosis not present

## 2011-08-24 DIAGNOSIS — J449 Chronic obstructive pulmonary disease, unspecified: Secondary | ICD-10-CM | POA: Insufficient documentation

## 2011-08-24 DIAGNOSIS — G8918 Other acute postprocedural pain: Secondary | ICD-10-CM | POA: Diagnosis not present

## 2011-08-24 DIAGNOSIS — Z01812 Encounter for preprocedural laboratory examination: Secondary | ICD-10-CM | POA: Insufficient documentation

## 2011-08-24 DIAGNOSIS — M19019 Primary osteoarthritis, unspecified shoulder: Secondary | ICD-10-CM | POA: Insufficient documentation

## 2011-08-24 DIAGNOSIS — Z9889 Other specified postprocedural states: Secondary | ICD-10-CM | POA: Diagnosis not present

## 2011-08-24 DIAGNOSIS — J4489 Other specified chronic obstructive pulmonary disease: Secondary | ICD-10-CM | POA: Insufficient documentation

## 2011-08-24 HISTORY — PX: SHOULDER ARTHROSCOPY: SHX128

## 2011-08-24 LAB — GLUCOSE, CAPILLARY
Glucose-Capillary: 134 mg/dL — ABNORMAL HIGH (ref 70–99)
Glucose-Capillary: 113 mg/dL — ABNORMAL HIGH (ref 70–99)
Glucose-Capillary: 198 mg/dL — ABNORMAL HIGH (ref 70–99)

## 2011-08-24 SURGERY — ARTHROSCOPY, SHOULDER
Anesthesia: General | Site: Shoulder | Laterality: Right | Wound class: Clean

## 2011-08-24 MED ORDER — METOCLOPRAMIDE HCL 5 MG/ML IJ SOLN
5.0000 mg | Freq: Three times a day (TID) | INTRAMUSCULAR | Status: DC | PRN
  Filled 2011-08-24: qty 2

## 2011-08-24 MED ORDER — ALPRAZOLAM 0.5 MG PO TABS: 0.5000 mg | ORAL_TABLET | Freq: Three times a day (TID) | ORAL | Status: DC | PRN

## 2011-08-24 MED ORDER — RAMIPRIL 10 MG PO TABS: 10.0000 mg | ORAL_TABLET | Freq: Every day | ORAL | Status: DC

## 2011-08-24 MED ORDER — ACETAMINOPHEN 650 MG RE SUPP: 650.0000 mg | Freq: Four times a day (QID) | RECTAL | Status: DC | PRN

## 2011-08-24 MED ORDER — FENTANYL CITRATE 0.05 MG/ML IJ SOLN
INTRAMUSCULAR | Status: DC | PRN
  Administered 2011-08-24: 100 ug via INTRAVENOUS

## 2011-08-24 MED ORDER — HYDROMORPHONE HCL PF 1 MG/ML IJ SOLN: 0.5000 mg | INTRAMUSCULAR | Status: DC | PRN

## 2011-08-24 MED ORDER — METHOCARBAMOL 100 MG/ML IJ SOLN
500.0000 mg | Freq: Four times a day (QID) | INTRAMUSCULAR | Status: DC | PRN
  Filled 2011-08-24: qty 5

## 2011-08-24 MED ORDER — ASPIRIN 81 MG PO TABS: 81.0000 mg | ORAL_TABLET | Freq: Every day | ORAL | Status: DC

## 2011-08-24 MED ORDER — DROPERIDOL 2.5 MG/ML IJ SOLN
0.6250 mg | INTRAMUSCULAR | Status: DC | PRN
  Filled 2011-08-24: qty 0.25

## 2011-08-24 MED ORDER — ZOLPIDEM TARTRATE 10 MG PO TABS
10.0000 mg | ORAL_TABLET | Freq: Every evening | ORAL | Status: DC | PRN
  Administered 2011-08-24: 10 mg via ORAL
  Filled 2011-08-24: qty 1

## 2011-08-24 MED ORDER — ONDANSETRON HCL 4 MG/2ML IJ SOLN
INTRAMUSCULAR | Status: DC | PRN
  Administered 2011-08-24: 4 mg via INTRAVENOUS

## 2011-08-24 MED ORDER — ACETAMINOPHEN 325 MG PO TABS
650.0000 mg | ORAL_TABLET | Freq: Four times a day (QID) | ORAL | Status: DC | PRN
  Administered 2011-08-24: 650 mg via ORAL

## 2011-08-24 MED ORDER — FENTANYL CITRATE 0.05 MG/ML IJ SOLN
50.0000 ug | INTRAMUSCULAR | Status: DC | PRN
  Administered 2011-08-24: 100 ug via INTRAVENOUS

## 2011-08-24 MED ORDER — FENTANYL CITRATE 0.05 MG/ML IJ SOLN
INTRAMUSCULAR | Status: AC
  Filled 2011-08-24: qty 2

## 2011-08-24 MED ORDER — ASPIRIN EC 81 MG PO TBEC
81.0000 mg | DELAYED_RELEASE_TABLET | Freq: Every day | ORAL | Status: DC
  Administered 2011-08-24 – 2011-08-25 (×2): 81 mg via ORAL
  Filled 2011-08-24 (×2): qty 1

## 2011-08-24 MED ORDER — BUPIVACAINE-EPINEPHRINE PF 0.5-1:200000 % IJ SOLN
INTRAMUSCULAR | Status: DC | PRN
  Administered 2011-08-24: 150 mg

## 2011-08-24 MED ORDER — NEOSTIGMINE METHYLSULFATE 1 MG/ML IJ SOLN
INTRAMUSCULAR | Status: DC | PRN
  Administered 2011-08-24: 5 mg via INTRAVENOUS

## 2011-08-24 MED ORDER — OLMESARTAN 10 MG HALF TABLET
10.0000 mg | ORAL_TABLET | Freq: Every day | ORAL | Status: DC
  Administered 2011-08-24 – 2011-08-25 (×2): 10 mg via ORAL
  Filled 2011-08-24 (×2): qty 1

## 2011-08-24 MED ORDER — LACTATED RINGERS IV SOLN
INTRAVENOUS | Status: DC
  Administered 2011-08-24: 17:00:00 via INTRAVENOUS

## 2011-08-24 MED ORDER — METHOCARBAMOL 500 MG PO TABS
500.0000 mg | ORAL_TABLET | Freq: Four times a day (QID) | ORAL | Status: DC | PRN
  Administered 2011-08-24 – 2011-08-25 (×2): 500 mg via ORAL
  Filled 2011-08-24 (×2): qty 1

## 2011-08-24 MED ORDER — HYDROMORPHONE HCL PF 1 MG/ML IJ SOLN: 0.2500 mg | INTRAMUSCULAR | Status: DC | PRN

## 2011-08-24 MED ORDER — IPRATROPIUM-ALBUTEROL 18-103 MCG/ACT IN AERO
2.0000 | INHALATION_SPRAY | Freq: Four times a day (QID) | RESPIRATORY_TRACT | Status: DC | PRN
  Filled 2011-08-24: qty 14.7

## 2011-08-24 MED ORDER — SERTRALINE HCL 100 MG PO TABS
100.0000 mg | ORAL_TABLET | Freq: Every day | ORAL | Status: DC
  Administered 2011-08-24 – 2011-08-25 (×2): 100 mg via ORAL
  Filled 2011-08-24 (×2): qty 1

## 2011-08-24 MED ORDER — PROPOFOL 10 MG/ML IV EMUL
INTRAVENOUS | Status: DC | PRN
  Administered 2011-08-24: 170 mg via INTRAVENOUS

## 2011-08-24 MED ORDER — GLYCOPYRROLATE 0.2 MG/ML IJ SOLN
INTRAMUSCULAR | Status: DC | PRN
  Administered 2011-08-24: .6 mg via INTRAVENOUS

## 2011-08-24 MED ORDER — ONDANSETRON HCL 4 MG PO TABS: 4.0000 mg | ORAL_TABLET | Freq: Four times a day (QID) | ORAL | Status: DC | PRN

## 2011-08-24 MED ORDER — SODIUM CHLORIDE 0.9 % IR SOLN
Status: DC | PRN
  Administered 2011-08-24: 9000 mL

## 2011-08-24 MED ORDER — BUPROPION HCL ER (XL) 300 MG PO TB24
300.0000 mg | ORAL_TABLET | Freq: Every day | ORAL | Status: DC
  Administered 2011-08-24: 300 mg via ORAL
  Filled 2011-08-24 (×2): qty 1

## 2011-08-24 MED ORDER — MIDAZOLAM HCL 2 MG/2ML IJ SOLN
INTRAMUSCULAR | Status: AC
  Filled 2011-08-24: qty 2

## 2011-08-24 MED ORDER — ONDANSETRON HCL 4 MG/2ML IJ SOLN: 4.0000 mg | Freq: Four times a day (QID) | INTRAMUSCULAR | Status: DC | PRN

## 2011-08-24 MED ORDER — FLUTICASONE-SALMETEROL 500-50 MCG/DOSE IN AEPB
1.0000 | INHALATION_SPRAY | Freq: Two times a day (BID) | RESPIRATORY_TRACT | Status: DC
  Administered 2011-08-24: 1 via RESPIRATORY_TRACT
  Filled 2011-08-24 (×2): qty 14

## 2011-08-24 MED ORDER — MIDAZOLAM HCL 2 MG/2ML IJ SOLN
1.0000 mg | INTRAMUSCULAR | Status: DC | PRN
  Administered 2011-08-24: 2 mg via INTRAVENOUS

## 2011-08-24 MED ORDER — OXYCODONE-ACETAMINOPHEN 5-325 MG PO TABS
1.0000 | ORAL_TABLET | ORAL | Status: DC | PRN
  Administered 2011-08-24 – 2011-08-25 (×3): 2 via ORAL
  Filled 2011-08-24 (×3): qty 2

## 2011-08-24 MED ORDER — ROCURONIUM BROMIDE 100 MG/10ML IV SOLN
INTRAVENOUS | Status: DC | PRN
  Administered 2011-08-24: 40 mg via INTRAVENOUS

## 2011-08-24 MED ORDER — CELECOXIB 200 MG PO CAPS
200.0000 mg | ORAL_CAPSULE | Freq: Every day | ORAL | Status: DC
  Administered 2011-08-24 – 2011-08-25 (×2): 200 mg via ORAL
  Filled 2011-08-24 (×2): qty 1

## 2011-08-24 MED ORDER — METOCLOPRAMIDE HCL 10 MG PO TABS: 5.0000 mg | ORAL_TABLET | Freq: Three times a day (TID) | ORAL | Status: DC | PRN

## 2011-08-24 MED ORDER — RAMIPRIL 10 MG PO CAPS
10.0000 mg | ORAL_CAPSULE | Freq: Every day | ORAL | Status: DC
  Administered 2011-08-24 – 2011-08-25 (×2): 10 mg via ORAL
  Filled 2011-08-24 (×2): qty 1

## 2011-08-24 MED ORDER — LACTATED RINGERS IV SOLN
INTRAVENOUS | Status: DC | PRN
  Administered 2011-08-24 (×2): via INTRAVENOUS

## 2011-08-24 MED ORDER — MENTHOL 3 MG MT LOZG: 1.0000 | LOZENGE | OROMUCOSAL | Status: DC | PRN

## 2011-08-24 MED ORDER — SIMVASTATIN 20 MG PO TABS
20.0000 mg | ORAL_TABLET | Freq: Every day | ORAL | Status: DC
  Administered 2011-08-24: 20 mg via ORAL
  Filled 2011-08-24 (×2): qty 1

## 2011-08-24 MED ORDER — ARIPIPRAZOLE 2 MG PO TABS
2.0000 mg | ORAL_TABLET | Freq: Every day | ORAL | Status: DC
  Filled 2011-08-24 (×2): qty 1

## 2011-08-24 MED ORDER — PHENOL 1.4 % MT LIQD
1.0000 | OROMUCOSAL | Status: DC | PRN
  Filled 2011-08-24: qty 177

## 2011-08-24 SURGICAL SUPPLY — 50 items
BLADE CUTTER GATOR 3.5 (BLADE) ×2 IMPLANT
BLADE GREAT WHITE 4.2 (BLADE) ×2 IMPLANT
BLADE SURG 11 STRL SS (BLADE) ×2 IMPLANT
BOOTCOVER CLEANROOM LRG (PROTECTIVE WEAR) ×4 IMPLANT
BUR OVAL 4.0 (BURR) ×2 IMPLANT
CANISTER SUCT LVC 12 LTR MEDI- (MISCELLANEOUS) ×2 IMPLANT
CANNULA ACUFLEX KIT 5X76 (CANNULA) ×2 IMPLANT
CLOSURE STERI STRIP 1/2 X4 (GAUZE/BANDAGES/DRESSINGS) ×1 IMPLANT
CLOTH BEACON ORANGE TIMEOUT ST (SAFETY) ×2 IMPLANT
DRAPE INCISE 23X17 IOBAN STRL (DRAPES) ×1
DRAPE INCISE IOBAN 23X17 STRL (DRAPES) ×1 IMPLANT
DRAPE STERI 35X30 U-POUCH (DRAPES) ×2 IMPLANT
DRAPE SURG 17X11 SM STRL (DRAPES) ×2 IMPLANT
DRAPE U-SHAPE 47X51 STRL (DRAPES) ×2 IMPLANT
DRSG PAD ABDOMINAL 8X10 ST (GAUZE/BANDAGES/DRESSINGS) ×2 IMPLANT
DURAPREP 26ML APPLICATOR (WOUND CARE) ×2 IMPLANT
GAUZE SPONGE 4X4 12PLY STRL LF (GAUZE/BANDAGES/DRESSINGS) ×2 IMPLANT
GLOVE BIO SURGEON STRL SZ7.5 (GLOVE) ×2 IMPLANT
GLOVE BIO SURGEON STRL SZ8 (GLOVE) ×2 IMPLANT
GLOVE EUDERMIC 7 POWDERFREE (GLOVE) ×2 IMPLANT
GLOVE SS BIOGEL STRL SZ 7.5 (GLOVE) ×1 IMPLANT
GLOVE SUPERSENSE BIOGEL SZ 7.5 (GLOVE) ×1
GOWN STRL NON-REIN LRG LVL3 (GOWN DISPOSABLE) ×2 IMPLANT
KIT BASIN OR (CUSTOM PROCEDURE TRAY) ×2 IMPLANT
KIT ROOM TURNOVER OR (KITS) ×2 IMPLANT
KIT SHOULDER TRACTION (DRAPES) ×2 IMPLANT
MANIFOLD NEPTUNE II (INSTRUMENTS) ×2 IMPLANT
NDL SUT 6 .5 CRC .975X.05 MAYO (NEEDLE) IMPLANT
NEEDLE MAYO TAPER (NEEDLE)
NEEDLE SPNL 18GX3.5 QUINCKE PK (NEEDLE) ×2 IMPLANT
NS IRRIG 1000ML POUR BTL (IV SOLUTION) ×2 IMPLANT
PACK SHOULDER (CUSTOM PROCEDURE TRAY) ×2 IMPLANT
PAD ARMBOARD 7.5X6 YLW CONV (MISCELLANEOUS) ×4 IMPLANT
SET ARTHROSCOPY TUBING (MISCELLANEOUS) ×2
SET ARTHROSCOPY TUBING LN (MISCELLANEOUS) ×1 IMPLANT
SLING ARM FOAM STRAP MED (SOFTGOODS) IMPLANT
SLING ARM IMMOBILIZER LRG (SOFTGOODS) ×1 IMPLANT
SLING ARM IMMOBILIZER MED (SOFTGOODS) IMPLANT
SPONGE GAUZE 4X4 12PLY (GAUZE/BANDAGES/DRESSINGS) ×2 IMPLANT
SPONGE LAP 4X18 X RAY DECT (DISPOSABLE) ×2 IMPLANT
STRIP CLOSURE SKIN 1/2X4 (GAUZE/BANDAGES/DRESSINGS) ×2 IMPLANT
SUT MNCRL AB 3-0 PS2 18 (SUTURE) ×2 IMPLANT
SUT PDS AB 1 CT  36 (SUTURE) ×1
SUT PDS AB 1 CT 36 (SUTURE) IMPLANT
SYR 20CC LL (SYRINGE) ×2 IMPLANT
TAPE PAPER 3X10 WHT MICROPORE (GAUZE/BANDAGES/DRESSINGS) ×2 IMPLANT
TOWEL OR 17X24 6PK STRL BLUE (TOWEL DISPOSABLE) ×2 IMPLANT
TOWEL OR 17X26 10 PK STRL BLUE (TOWEL DISPOSABLE) ×2 IMPLANT
WAND SUCTION MAX 4MM 90S (SURGICAL WAND) ×2 IMPLANT
WATER STERILE IRR 1000ML POUR (IV SOLUTION) ×2 IMPLANT

## 2011-08-24 NOTE — H&P (Signed)
[name]    Chief Complaint: right shoulder inpingment  HPI: The patient is a 71 y.o. male with chronic right shoulder impingement and ac joint oa,  Past Medical History  Diagnosis Date  . Microscopic colitis   . Obesity   . HLD (hyperlipidemia)     takes Lipitor daily  . COPD (chronic obstructive pulmonary disease)   . Asthma   . ED (erectile dysfunction)   . Diverticulosis 01/2007  . Internal hemorrhoids 01/2007  . Cataract   . HTN (hypertension)     takes Altace and Diovan daily  . Sleep apnea     uses CPAP;has been greater than 16yrs since sleep study  . Pneumonia 3/09  . Arthritis   . Joint pain   . Joint swelling   . GERD (gastroesophageal reflux disease)     uses Tums  . History of colon polyps   . Nocturia   . DM (diabetes mellitus)     takes Metformin 1000mg  bid  . Impaired hearing     "getting this checked out on Monday"  . Anxiety     Xanax as needed  . Depression     takes Cymbalta daily  . Insomnia     Past Surgical History  Procedure Date  . Umbilical hernia repair 5+yrs ago  . Sleep apnea surgery 1997  . Colonoscopy w/ biopsies 2008    Looks normal, biopsy suggested a chronic microscopic colitis with rare granulomas  . Nose surgery 2011  . Colonoscopy w/ biopsies 02/2011    diverticulosis, internal hemorrhoids, patchy chronic colitis on random bxs,   . Tonsillectomy     as a child  . Cholecystectomy 2011  . Cataract surgery 2011  . Penile prosthesis  removal 2012    Family History  Problem Relation Age of Onset  . Esophageal cancer Paternal Grandmother   . Colon cancer Neg Hx     Social History:  reports that he has never smoked. He has never used smokeless tobacco. He reports that he does not drink alcohol or use illicit drugs.  Allergies:  Allergies  Allergen Reactions  . Shellfish Allergy     Medications Prior to Admission  Medication Dose Route Frequency Provider Last Rate Last Dose  . ceFAZolin (ANCEF) IVPB 2 g/50 mL  premix  2 g Intravenous 60 min Pre-Op Jenetta Loges, PA      . chlorhexidine (HIBICLENS) 4 % liquid 4 application  60 mL Topical Once [employer], PA      . fentaNYL (SUBLIMAZE) injection 50-100 mcg  50-100 mcg Intravenous PRN Delma Freeze, MD   100 mcg at 08/24/11 1117  . lactated ringers infusion   Intravenous Continuous Jenetta Loges, PA 20 mL/hr at 08/24/11 1105    . midazolam (VERSED) injection 1-2 mg  1-2 mg Intravenous PRN Delma Freeze, MD   2 mg at 08/24/11 1117   Medications Prior to Admission  Medication Sig Dispense Refill  . albuterol-ipratropium (COMBIVENT) 18-103 MCG/ACT inhaler Inhale 2 puffs into the lungs as needed.        . ALPRAZolam (XANAX) 0.5 MG tablet       . aspirin 81 MG tablet Take 81 mg by mouth daily.        Marland Kitchen atorvastatin (LIPITOR) 10 MG tablet Take 10 mg by mouth daily.        . Bismuth Subsalicylate (PEPTO-BISMOL PO) Take by mouth. Liquid will use 3-4 times a day       . buPROPion (  WELLBUTRIN XL) 300 MG 24 hr tablet Take 300 mg by mouth daily.        . Calcium Carbonate Antacid (TUMS PO) Take by mouth. 3-4 daily       . CELEBREX 200 MG capsule       . Fluticasone-Salmeterol (ADVAIR DISKUS) 500-50 MCG/DOSE AEPB Inhale 1 puff into the lungs 2 (two) times daily.        Marland Kitchen loperamide (IMODIUM A-D) 2 MG tablet Take 2 mg by mouth as needed.        . metformin (FORTAMET) 1000 MG (OSM) 24 hr tablet Take 1,000 mg by mouth 2 (two) times daily with a meal.        . Multiple Vitamin (MULTIVITAMIN PO) Take by mouth.        . ramipril (ALTACE) 10 MG tablet Take 10 mg by mouth daily.        . sertraline (ZOLOFT) 100 MG tablet Take 100 mg by mouth daily.        . valsartan (DIOVAN) 80 MG tablet Take 80 mg by mouth daily.        Marland Kitchen zolpidem (AMBIEN) 10 MG tablet Take 10 mg by mouth as needed.           Physical Exam: Right shoulder examined and unchanged from office visit 08/16/11  Vitals  Temp:  [97.8 F (36.6 C)] 97.8 F (36.6 C) (01/10 1006) Pulse Rate:   [53-63] 63  (01/10 1130) Resp:  [15-18] 15  (01/10 1130) BP: (132)/(68) 132/68 mmHg (01/10 1006) SpO2:  [96 %-98 %] 98 % (01/10 1130)  Assessment/Plan  Impression: right shoulder inpingment   Plan of Action: Procedure(s): ARTHROSCOPY SHOULDER with debridement, SAD, DCR Evaristo Tsuda M 08/24/2011, 12:02 PM

## 2011-08-24 NOTE — Anesthesia Preprocedure Evaluation (Addendum)
Anesthesia Evaluation  Patient identified by MRN, date of birth, ID band Patient awake    Reviewed: Allergy & Precautions, H&P , NPO status , Patient's Chart, lab work & pertinent test results, reviewed documented beta blocker date and time   Airway Mallampati: II      Dental  (+) Teeth Intact   Pulmonary asthma , sleep apnea and Continuous Positive Airway Pressure Ventilation , pneumonia , COPD COPD inhaler,  clear to auscultation  Pulmonary exam normal       Cardiovascular hypertension, Pt. on medications Regular Normal- Systolic murmurs    Neuro/Psych PSYCHIATRIC DISORDERS Anxiety Depression    GI/Hepatic Neg liver ROS, GERD-  Medicated and Controlled,  Endo/Other  Diabetes mellitus-, Well Controlled, Type 2, Oral Hypoglycemic Agents  Renal/GU negative Renal ROS     Musculoskeletal   Abdominal   Peds  Hematology   Anesthesia Other Findings   Reproductive/Obstetrics                          Anesthesia Physical Anesthesia Plan  ASA: III  Anesthesia Plan: General   Post-op Pain Management:    Induction: Intravenous  Airway Management Planned: Oral ETT  Additional Equipment:   Intra-op Plan:   Post-operative Plan:   Informed Consent: I have reviewed the patients History and Physical, chart, labs and discussed the procedure including the risks, benefits and alternatives for the proposed anesthesia with the patient or authorized representative who has indicated his/her understanding and acceptance.     Plan Discussed with: CRNA, Anesthesiologist and Surgeon  Anesthesia Plan Comments:         Anesthesia Quick Evaluation

## 2011-08-24 NOTE — Preoperative (Signed)
Beta Blockers   Reason not to administer Beta Blockers:Not Applicable. No home beta blockers 

## 2011-08-24 NOTE — Op Note (Signed)
08/24/2011  1:49 PM  PATIENT:   [name]  71 y.o. male  PRE-OPERATIVE DIAGNOSIS:  right shoulder inpingment   POST-OPERATIVE DIAGNOSIS:  Same with partial articular rotator cuff tear and labral tear  PROCEDURE:  RSA, labral and rotator cuff debridement, sad  SURGEON:  Lakhia Gengler, Metta Clines. M.D.  ASSISTANTS: Shuford pac   ANESTHESIA:   GET + ISB  EBL: min  SPECIMEN:  none  Drains: none   PATIENT DISPOSITION:  PACU - hemodynamically stable.    PLAN OF CARE:overnight OBS   Dictation# [id number]

## 2011-08-24 NOTE — Anesthesia Procedure Notes (Addendum)
Anesthesia Regional Block:  Interscalene brachial plexus block  Pre-Anesthetic Checklist: ,, timeout performed, Correct Patient, Correct Site, Correct Laterality, Correct Procedure,, site marked, risks and benefits discussed, Surgical consent,  Pre-op evaluation,  At surgeon's request and post-op pain management  Laterality: Right  Prep: chloraprep       Needles:  Injection technique: Single-shot  Needle Type: Echogenic Stimulator Needle     Needle Length: 5cm 5 cm Needle Gauge: 22 and 22 G    Additional Needles:  Procedures: ultrasound guided and nerve stimulator Interscalene brachial plexus block  Nerve Stimulator or Paresthesia:  Response: bicep contraction, 0.45 mA,   Additional Responses:   Narrative:  Injection made incrementally with aspirations every 5 mL.  Performed by: Personally  Anesthesiologist: J. Tamela Gammon, MD  Additional Notes: Functioning IV was confirmed and monitors applied.  A 67mm 22ga echogenic arrow stimulator was used. Sterile prep and drape,hand hygiene and sterile gloves were used.Ultrasound guidance: relevent anatomy identified, needle position confirmed, local anesthetic spread visualized around nerve(s)., vascular puncture avoided.  Image printed for medical record.  Negative aspiration and negative test dose prior to incremental administration of local anesthetic. The patient tolerated the procedure well.  Interscalene brachial plexus block Procedure Name: Intubation Date/Time: 08/24/2011 12:54 PM Performed by: Virgil Benedict Pre-anesthesia Checklist: Patient identified, Emergency Drugs available, Suction available, Patient being monitored and Timeout performed Patient Re-evaluated:Patient Re-evaluated prior to inductionOxygen Delivery Method: Circle System Utilized Preoxygenation: Pre-oxygenation with 100% oxygen Intubation Type: IV induction Ventilation: Mask ventilation without difficulty Laryngoscope Size: Mac and 3 Grade View: Grade  II Tube type: Oral Tube size: 7.5 mm Number of attempts: 1 Airway Equipment and Method: stylet Placement Confirmation: ETT inserted through vocal cords under direct vision,  positive ETCO2 and breath sounds checked- equal and bilateral Secured at: 21 cm Tube secured with: Tape Dental Injury: Teeth and Oropharynx as per pre-operative assessment

## 2011-08-25 ENCOUNTER — Encounter (HOSPITAL_COMMUNITY): Payer: Self-pay | Admitting: Orthopedic Surgery

## 2011-08-25 LAB — GLUCOSE, CAPILLARY
Glucose-Capillary: 146 mg/dL — ABNORMAL HIGH (ref 70–99)
Glucose-Capillary: 148 mg/dL — ABNORMAL HIGH (ref 70–99)

## 2011-08-25 MED ORDER — METHOCARBAMOL 500 MG PO TABS: 500.0000 mg | ORAL_TABLET | Freq: Three times a day (TID) | ORAL | Status: AC | PRN

## 2011-08-25 MED ORDER — ARIPIPRAZOLE 2 MG PO TABS
2.0000 mg | ORAL_TABLET | Freq: Every day | ORAL | Status: DC
  Filled 2011-08-25: qty 1

## 2011-08-25 MED ORDER — OXYCODONE-ACETAMINOPHEN 5-325 MG PO TABS
1.0000 | ORAL_TABLET | ORAL | Status: AC | PRN
Start: 2011-08-25 — End: 2011-09-04

## 2011-08-25 NOTE — Progress Notes (Signed)
OT consult received, however noted in the comments section pt. With no OT needs. Will sign off. Please re-consult if needed. Thanks!  Lavona Mound, OTR/L Pager: [phone] 08/25/2011 .

## 2011-08-25 NOTE — Op Note (Signed)
[name], [name] NO.:  [id number]  MEDICAL RECORD NO.:  [id number]  LOCATION:  [id number]                         FACILITY:  [facility]  PHYSICIAN:  Metta Clines. Shaira Sova, M.D.  DATE OF BIRTH:  1940/11/21  DATE OF PROCEDURE:  08/24/2011 DATE OF DISCHARGE:                              OPERATIVE REPORT   PREOPERATIVE DIAGNOSES: 1. Chronic right shoulder impingement syndrome. 2. Right shoulder acromioclavicular joint arthropathy.  POSTOPERATIVE DIAGNOSES: 1. Chronic right shoulder impingement syndrome. 2. Right shoulder distal clavicle osteolysis. 3. Extensive degenerative labral tear. 4. Partial articular rotator cuff tear.  PROCEDURE: 1. Right shoulder examination under anesthesia. 2. Right shoulder glenohumeral joint diagnostic arthroscopy. 3. Debridement of complex and extensive degenerative labral tear. 4. Debridement of partial articular rotator cuff tear. 5. Arthroscopic subacromial decompression, bursectomy, and lysis of     adhesions.  SURGEON:  Metta Clines. Liesa Tsan, MD  ASSISTANT:  Reather Laurence. Shuford, PA-C  ANESTHESIA:  General endotracheal as well as an interscalene block.  ESTIMATED BLOOD LOSS:  Minimal.  DRAINS:  None.  HISTORY:  Mr. Swartout is a 71 year old gentleman who has had chronic and progressively increasing right shoulder pain with functional limitations despite prolonged attempts at conservative management.  He is brought to the operating room at this time for planned right shoulder arthroscopy as described below.  Preoperatively, I counseled Mr. Mcmurphy on treatment options as well as risks versus benefits thereof.  Possible surgical complications were reviewed including potential for bleeding, infection, neurovascular injury, persistent pain, loss of motion, anesthetic complication, possible need for additional surgery.  He understands and accepts and agrees with planned procedure.  PROCEDURE IN DETAIL:  After undergoing routine preop  evaluation, the patient received prophylactic antibiotics.  An interscalene block was established in holding area by the Anesthesia Department.  Placed supine on the operating table, underwent smooth induction of a general endotracheal anesthesia.  In addition, we did place a two-way in-and-out catheterization removing approximately 200 mL of urine.  The patient was then turned to the left lateral decubitus position on a beanbag and appropriate padded and protected.  Right shoulder examination under anesthesia revealed full motion.  No instability patterns.  Right arm suspended at 70 degrees of abduction with 10 pounds traction.  The right shoulder girdle region was sterilely prepped and draped in standard fashion.  A time-out was called.  A posterior portal was established in the glenohumeral joint and anterior portal was established under direct visualization.  The glenohumeral articular surfaces were in good condition.  There was, however, extensive degenerative tearing of the anterior, superior, and posterior aspect of the labrum particularly anterosuperior and a type 1 SLAP lesion.  All these were debrided with a shaver to a stable rim.  There was no obvious instability patterns.  The biceps anchor was stable.  There was a significant partial articular rotator cuff tear which we debrided with shaver to healthy appearing tissue with stable margin.  I would estimate it was close to 20 to 25% of thickness of the tendon.  We did pass the tag suture of 0 PDS at this point.  At this point, inspection was completed.  Fluid and instruments were removed.  The arm was dropped down to 30 degrees abduction with the arthroscope introduced in the subacromial space of the posterior portal and a direct lateral portal was established in the subacromial space. Abundant dense prolific bursal tissue and multiple adhesions were encountered and these were divided and excised with combination of shaver and  Stryker wand.  The wand was then used to remove the periosteum from the undersurface of the anterior half of the acromion. Then, subacromial depression was performed with a bur creating a type 1 morphology.  The portal was then established directly anterior to the distal clavicle.  At this point, I removed the inferior capsule tissues away from the acromioclavicular joint and as we had suspected based on his history with a previous AC joint injury and plain films showing osteolysis of distal clavicle.  Once we had removed the inferior capsule, it became clear that there was no direct bony impingement between the clavicle and the acromion and the osteolysis had essentially already completed the appropriate bony resection and so no bone work was required.  We removed the scar tissue around this region.  At this point, we then completed the subacromial bursectomy and lysis of adhesions.  We carefully inspected and probed the bursal side of rotator cuff and the area surrounding the tag suture did not reveal any full- thickness or even significant partial-thickness defects and overall quality and thickness of the tendon in this area appeared to be adequate for healing.  The tag suture was removed.  We completed the bursectomy. Hemostasis was obtained.  Fluid and instruments were removed.  The portals were closed with Monocryl and Steri-Strips.  Bulky dry dressing was taped at the right shoulder.  Right arm was placed in a sling immobilizer.  The patient was awakened, extubated, and taken to recovery room in stable condition.     Metta Clines. Zhavia Cunanan, M.D.     KMS/MEDQ  D:  08/24/2011  T:  08/25/2011  Job:  [id number]

## 2011-08-25 NOTE — Transfer of Care (Signed)
Immediate Anesthesia Transfer of Care Note  Patient: [name]  Procedure(s) Performed:  ARTHROSCOPY SHOULDER - RIGHT SHOULDER ARTHROSCOPY WITH SUBACROMINAL DECOMPRESSION AND DISTAL CLAVICAL RESECTION   Patient Location: PACU  Anesthesia Type: General  Level of Consciousness: awake and oriented  Airway & Oxygen Therapy: Patient Spontanous Breathing and Patient connected to face mask oxygen  Post-op Assessment: Report given to PACU RN and Post -op Vital signs reviewed and stable  Post vital signs: Reviewed and stable Filed Vitals:   08/25/11 0451  BP: 133/78  Pulse: 59  Temp: 36.6 C  Resp: 18    Complications: No apparent anesthesia complications

## 2011-08-25 NOTE — Progress Notes (Signed)
Discharge instructions reviewed with pt and friend.  Pt verbalized understanding of all instructions, copy of instructions and scripts given to pt. Pt given dressings to change dressing at home, pt has friend to help with dressing change as needed. Pt d/c'd via wheelchair with belongings escorted by hospital volunteer.

## 2011-08-25 NOTE — Discharge Summary (Signed)
Physician Discharge Summary  Patient ID: [name] MRN: [id number] DOB/AGE: 69/11/1940 9 y.o.  Admit date: 08/24/2011 Discharge date: 08/25/2011  Admission Diagnoses: Right shoulder impingement  Discharge Diagnoses: same  Discharged Condition: good  Hospital Course: Stable and comfortable overnight. Kept secondary to sleep apnea.No breathing difficulties and tolerating pos, stable for dc home     Operative Procedure: R SA, SAD,DCR, debridement of partial RTC  Disposition:   Discharge Orders    Future Orders Please Complete By Expires   Change dressing        Current Discharge Medication List    START taking these medications   Details  methocarbamol (ROBAXIN) 500 MG tablet Take 1 tablet (500 mg total) by mouth 3 (three) times daily as needed. Qty: 30 tablet, Refills: 1    oxyCODONE-acetaminophen (PERCOCET) 5-325 MG per tablet Take 1-2 tablets by mouth every 4 (four) hours as needed for pain. Qty: 40 tablet, Refills: 0      CONTINUE these medications which have NOT CHANGED   Details  albuterol-ipratropium (COMBIVENT) 18-103 MCG/ACT inhaler Inhale 2 puffs into the lungs as needed.      ALPRAZolam (XANAX) 0.5 MG tablet     ARIPiprazole (ABILIFY) 2 MG tablet Take 2 mg by mouth daily.      aspirin 81 MG tablet Take 81 mg by mouth daily.      atorvastatin (LIPITOR) 10 MG tablet Take 10 mg by mouth daily.      Bismuth Subsalicylate (PEPTO-BISMOL PO) Take by mouth. Liquid will use 3-4 times a day     buPROPion (WELLBUTRIN XL) 300 MG 24 hr tablet Take 300 mg by mouth daily.      Calcium Carbonate Antacid (TUMS PO) Take by mouth. 3-4 daily     CELEBREX 200 MG capsule     Fluticasone-Salmeterol (ADVAIR DISKUS) 500-50 MCG/DOSE AEPB Inhale 1 puff into the lungs 2 (two) times daily.      loperamide (IMODIUM A-D) 2 MG tablet Take 2 mg by mouth as needed.      metformin (FORTAMET) 1000 MG (OSM) 24 hr tablet Take 1,000 mg by mouth 2 (two) times daily with a meal.      Multiple Vitamin (MULTIVITAMIN PO) Take by mouth.      ramipril (ALTACE) 10 MG tablet Take 10 mg by mouth daily.      sertraline (ZOLOFT) 100 MG tablet Take 100 mg by mouth daily.      valsartan (DIOVAN) 80 MG tablet Take 80 mg by mouth daily.      zolpidem (AMBIEN) 10 MG tablet Take 10 mg by mouth as needed.           Discharge Instructions: See printed sheets  Signed: [facility] 08/25/2011, 8:16 AM

## 2011-08-25 NOTE — Anesthesia Postprocedure Evaluation (Signed)
  Anesthesia Post-op Note  Patient: [name]  Procedure(s) Performed:  ARTHROSCOPY SHOULDER - RIGHT SHOULDER ARTHROSCOPY WITH SUBACROMINAL DECOMPRESSION AND DISTAL CLAVICAL RESECTION   Patient Location: Nursing Unit  Anesthesia Type: General  Level of Consciousness: awake, alert , oriented and patient cooperative  Airway and Oxygen Therapy: Patient Spontanous Breathing  Post-op Pain: mild  Post-op Assessment: Post-op Vital signs reviewed, Patient's Cardiovascular Status Stable, Respiratory Function Stable, Patent Airway, No signs of Nausea or vomiting, Adequate PO intake and Pain level controlled  Post-op Vital Signs: Reviewed and stable  Complications: No apparent anesthesia complications

## 2011-09-01 DIAGNOSIS — M545 Low back pain, unspecified: Secondary | ICD-10-CM | POA: Diagnosis not present

## 2011-09-04 DIAGNOSIS — N401 Enlarged prostate with lower urinary tract symptoms: Secondary | ICD-10-CM | POA: Diagnosis not present

## 2011-09-04 DIAGNOSIS — N138 Other obstructive and reflux uropathy: Secondary | ICD-10-CM | POA: Diagnosis not present

## 2011-09-04 DIAGNOSIS — R351 Nocturia: Secondary | ICD-10-CM | POA: Diagnosis not present

## 2011-09-04 DIAGNOSIS — N529 Male erectile dysfunction, unspecified: Secondary | ICD-10-CM | POA: Diagnosis not present

## 2011-09-14 ENCOUNTER — Other Ambulatory Visit (HOSPITAL_COMMUNITY): Payer: Medicare Other

## 2011-10-20 DIAGNOSIS — M25819 Other specified joint disorders, unspecified shoulder: Secondary | ICD-10-CM | POA: Diagnosis not present

## 2011-10-24 DIAGNOSIS — M67919 Unspecified disorder of synovium and tendon, unspecified shoulder: Secondary | ICD-10-CM | POA: Diagnosis not present

## 2011-10-24 DIAGNOSIS — M719 Bursopathy, unspecified: Secondary | ICD-10-CM | POA: Diagnosis not present

## 2011-10-27 DIAGNOSIS — M25819 Other specified joint disorders, unspecified shoulder: Secondary | ICD-10-CM | POA: Diagnosis not present

## 2011-10-30 DIAGNOSIS — M25819 Other specified joint disorders, unspecified shoulder: Secondary | ICD-10-CM | POA: Diagnosis not present

## 2011-11-01 DIAGNOSIS — M25819 Other specified joint disorders, unspecified shoulder: Secondary | ICD-10-CM | POA: Diagnosis not present

## 2011-11-06 DIAGNOSIS — M25519 Pain in unspecified shoulder: Secondary | ICD-10-CM | POA: Diagnosis not present

## 2011-11-15 DIAGNOSIS — M25519 Pain in unspecified shoulder: Secondary | ICD-10-CM | POA: Diagnosis not present

## 2011-11-22 DIAGNOSIS — M25519 Pain in unspecified shoulder: Secondary | ICD-10-CM | POA: Diagnosis not present

## 2011-11-24 DIAGNOSIS — M25519 Pain in unspecified shoulder: Secondary | ICD-10-CM | POA: Diagnosis not present

## 2011-12-13 DIAGNOSIS — F329 Major depressive disorder, single episode, unspecified: Secondary | ICD-10-CM | POA: Diagnosis not present

## 2011-12-13 DIAGNOSIS — I10 Essential (primary) hypertension: Secondary | ICD-10-CM | POA: Diagnosis not present

## 2011-12-13 DIAGNOSIS — E785 Hyperlipidemia, unspecified: Secondary | ICD-10-CM | POA: Diagnosis not present

## 2011-12-13 DIAGNOSIS — E1129 Type 2 diabetes mellitus with other diabetic kidney complication: Secondary | ICD-10-CM | POA: Diagnosis not present

## 2012-01-24 DIAGNOSIS — Z85828 Personal history of other malignant neoplasm of skin: Secondary | ICD-10-CM | POA: Diagnosis not present

## 2012-01-24 DIAGNOSIS — L570 Actinic keratosis: Secondary | ICD-10-CM | POA: Diagnosis not present

## 2012-02-15 DIAGNOSIS — L255 Unspecified contact dermatitis due to plants, except food: Secondary | ICD-10-CM | POA: Diagnosis not present

## 2012-02-26 DIAGNOSIS — H538 Other visual disturbances: Secondary | ICD-10-CM | POA: Diagnosis not present

## 2012-02-26 DIAGNOSIS — Z961 Presence of intraocular lens: Secondary | ICD-10-CM | POA: Diagnosis not present

## 2012-02-27 ENCOUNTER — Ambulatory Visit (INDEPENDENT_AMBULATORY_CARE_PROVIDER_SITE_OTHER): Payer: Medicare Other | Admitting: Family Medicine

## 2012-02-27 VITALS — HR 81 | Temp 98.6°F | Resp 17 | Ht 69.0 in | Wt 205.0 lb

## 2012-02-27 DIAGNOSIS — L03119 Cellulitis of unspecified part of limb: Secondary | ICD-10-CM

## 2012-02-27 DIAGNOSIS — E119 Type 2 diabetes mellitus without complications: Secondary | ICD-10-CM | POA: Diagnosis not present

## 2012-02-27 DIAGNOSIS — L02419 Cutaneous abscess of limb, unspecified: Secondary | ICD-10-CM

## 2012-02-27 DIAGNOSIS — L089 Local infection of the skin and subcutaneous tissue, unspecified: Secondary | ICD-10-CM | POA: Diagnosis not present

## 2012-02-27 DIAGNOSIS — L0390 Cellulitis, unspecified: Secondary | ICD-10-CM

## 2012-02-27 DIAGNOSIS — W57XXXA Bitten or stung by nonvenomous insect and other nonvenomous arthropods, initial encounter: Secondary | ICD-10-CM | POA: Diagnosis not present

## 2012-02-27 DIAGNOSIS — S80869A Insect bite (nonvenomous), unspecified lower leg, initial encounter: Secondary | ICD-10-CM | POA: Diagnosis not present

## 2012-02-27 MED ORDER — DOXYCYCLINE MONOHYDRATE 100 MG PO CAPS: 100.0000 mg | ORAL_CAPSULE | Freq: Two times a day (BID) | ORAL | Status: DC

## 2012-02-27 NOTE — Progress Notes (Signed)
  Subjective:    Patient ID: [name], male    DOB: Apr 28, 1941, 71 y.o.   MRN: [id number]  HPI Pt bitten by insect 4-5 days ago.  Was bitten on L medial ankle.  Area became red. Had mild purulent drainage following day.  Area has stayed red.  Redness has mildly worsened.  No fevers, chills.  Baseline diabetic.  On metformin only.  Last A1C 5.8 CBGs in 90s-120s.    Review of Systems See HPI, otherwise ROS negative     Objective:   Physical Exam Gen: up in chair, NAD HEENT: NCAT, EOMI, TMs clear bilaterally CV: RRR, no murmurs auscultated PULM: CTAB, no wheezes, rales, rhoncii ABD: S/NT/+ bowel sounds  EXT: 2+ peripheral pulses SKIN:          Assessment & Plan:  Mild secondary cellulitis vs. Inflammatory reaction from insect bite.  Lower threshold for treatment given diabetic status.  Will treat with doxy.  Follow up in 3 days for reassessment.  General red flags reviewed.  Handout given.   The patient and/or caregiver has been counseled thoroughly with regard to treatment plan and/or medications prescribed including dosage, schedule, interactions, rationale for use, and possible side effects and they verbalize understanding. Diagnoses and expected course of recovery discussed and will return if not improved as expected or if the condition worsens. Patient and/or caregiver verbalized understanding.

## 2012-02-27 NOTE — Patient Instructions (Signed)
Skin Infections A skin infection usually develops as a result of disruption of the skin barrier.  CAUSES  A skin infection might occur following:  Trauma or an injury to the skin such as a cut or insect sting.   Inflammation (as in eczema).   Breaks in the skin between the toes (as in athlete's foot).   Swelling (edema).  SYMPTOMS  The legs are the most common site affected. Usually there is:  Redness.   Swelling.   Pain.   There may be red streaks in the area of the infection.  TREATMENT   Minor skin infections may be treated with topical antibiotics, but if the skin infection is severe, hospital care and intravenous (IV) antibiotic treatment may be needed.   Most often skin infections can be treated with oral antibiotic medicine as well as proper rest and elevation of the affected area until the infection improves.   If you are prescribed oral antibiotics, it is important to take them as directed and to take all the pills even if you feel better before you have finished all of the medicine.   You may apply warm compresses to the area for 20-30 minutes 4 times daily.  You might need a tetanus shot now if:  You have no idea when you had the last one.   You have never had a tetanus shot before.   Your wound had dirt in it.  If you need a tetanus shot and you decide not to get one, there is a rare chance of getting tetanus. Sickness from tetanus can be serious. If you get a tetanus shot, your arm may swell and become red and warm at the shot site. This is common and not a problem. SEEK MEDICAL CARE IF:  The pain and swelling from your infection do not improve within 2 days.  SEEK IMMEDIATE MEDICAL CARE IF:  You develop a fever, chills, or other serious problems.  Document Released: 09/07/2004 Document Revised: 07/20/2011 Document Reviewed: 07/20/2008 [facility] Patient Information 2012 [place].

## 2012-03-01 ENCOUNTER — Ambulatory Visit (INDEPENDENT_AMBULATORY_CARE_PROVIDER_SITE_OTHER): Payer: Medicare Other | Admitting: Physician Assistant

## 2012-03-01 VITALS — BP 120/72 | HR 84 | Temp 98.0°F | Resp 16 | Ht 69.0 in | Wt 203.2 lb

## 2012-03-01 DIAGNOSIS — S81009A Unspecified open wound, unspecified knee, initial encounter: Secondary | ICD-10-CM

## 2012-03-01 DIAGNOSIS — S81801A Unspecified open wound, right lower leg, initial encounter: Secondary | ICD-10-CM

## 2012-03-01 DIAGNOSIS — S81809A Unspecified open wound, unspecified lower leg, initial encounter: Secondary | ICD-10-CM | POA: Diagnosis not present

## 2012-03-01 NOTE — Progress Notes (Signed)
  Subjective:    Patient ID: [name], male    DOB: Dec 04, 1940, 71 y.o.   MRN: [id number]  HPI Pt presents for recheck.  Feels good.  Tolerating Doxy ok.  Feels as though area is getting better.  Review of Systems     Objective:   Physical Exam  Constitutional: He appears well-developed and well-nourished.  HENT:  Head: Normocephalic.  Right Ear: External ear normal.  Left Ear: External ear normal.  Nose: Nose normal.  Pulmonary/Chest: Effort normal.  Skin: Skin is warm and dry.       Area on R leg has decreased erythema with good scab formation over wound. No warmth.          Assessment & Plan:  Wound leg - improved.  Finish ABX.  F/u if problems.

## 2012-03-04 DIAGNOSIS — H43399 Other vitreous opacities, unspecified eye: Secondary | ICD-10-CM | POA: Diagnosis not present

## 2012-03-04 DIAGNOSIS — H538 Other visual disturbances: Secondary | ICD-10-CM | POA: Diagnosis not present

## 2012-03-04 DIAGNOSIS — H35369 Drusen (degenerative) of macula, unspecified eye: Secondary | ICD-10-CM | POA: Diagnosis not present

## 2012-03-04 DIAGNOSIS — E119 Type 2 diabetes mellitus without complications: Secondary | ICD-10-CM | POA: Diagnosis not present

## 2012-03-19 ENCOUNTER — Encounter: Payer: Self-pay | Admitting: Internal Medicine

## 2012-03-19 ENCOUNTER — Ambulatory Visit (INDEPENDENT_AMBULATORY_CARE_PROVIDER_SITE_OTHER): Payer: Medicare Other | Admitting: Internal Medicine

## 2012-03-19 VITALS — BP 108/62 | HR 80 | Ht 68.0 in | Wt 199.0 lb

## 2012-03-19 DIAGNOSIS — K5289 Other specified noninfective gastroenteritis and colitis: Secondary | ICD-10-CM

## 2012-03-19 DIAGNOSIS — K52839 Microscopic colitis, unspecified: Secondary | ICD-10-CM

## 2012-03-19 DIAGNOSIS — R197 Diarrhea, unspecified: Secondary | ICD-10-CM

## 2012-03-19 MED ORDER — RIFAXIMIN 550 MG PO TABS: 550.0000 mg | ORAL_TABLET | Freq: Two times a day (BID) | ORAL | Status: DC

## 2012-03-19 MED ORDER — DIPHENOXYLATE-ATROPINE 2.5-0.025 MG PO TABS: 1.0000 | ORAL_TABLET | Freq: Four times a day (QID) | ORAL | Status: AC | PRN

## 2012-03-19 NOTE — Progress Notes (Signed)
Subjective:    Patient ID: [name], male    DOB: 13-Nov-1940, 71 y.o.   MRN: [id number]  HPI This patient returns in followup, he has microscopic colitis demonstrated on colonoscopy in 2012. He has had intermittent problems with significant diarrhea disrupting his lifestyle. Last year after the colonoscopy and random biopsies, I recommended a sick all but he had spontaneous resolution of the diarrhea. Since that time, over the past several months at least she has had increasing diarrhea, watery and urgent though he has not had incontinence. He is not having rectal bleeding. There is no significant abdominal pain reported. He does not have sleep disturbance related to the diarrhea. He is using Imodium, he will take for an intake for more but he really does not control things. He is also using Pepto-Bismol. He denies significant changes in his medications or other health problems in the interim.  He is still going through a lengthy divorce and has not reached a property sediment and is stress about that. He is happy about a new girlfriend. He is working out 3 times a week and has lost 20 pounds with his activity.  Allergies  Allergen Reactions  . Shellfish Allergy    Outpatient Prescriptions Prior to Visit  Medication Sig Dispense Refill  . albuterol-ipratropium (COMBIVENT) 18-103 MCG/ACT inhaler Inhale 2 puffs into the lungs as needed.        . ALPRAZolam (XANAX) 0.5 MG tablet       . aspirin 81 MG tablet Take 81 mg by mouth daily.        Marland Kitchen atorvastatin (LIPITOR) 10 MG tablet Take 10 mg by mouth daily.        . Bismuth Subsalicylate (PEPTO-BISMOL PO) Take by mouth. Liquid will use 3-4 times a day       . Calcium Carbonate Antacid (TUMS PO) Take by mouth. 3-4 daily       . CELEBREX 200 MG capsule       . Fluticasone-Salmeterol (ADVAIR DISKUS) 500-50 MCG/DOSE AEPB Inhale 1 puff into the lungs 2 (two) times daily.        . metformin (FORTAMET) 1000 MG (OSM) 24 hr tablet Take 1,000 mg by  mouth 2 (two) times daily with a meal.        . Multiple Vitamin (MULTIVITAMIN PO) Take by mouth.        . ramipril (ALTACE) 10 MG tablet Take 10 mg by mouth daily.        . sertraline (ZOLOFT) 100 MG tablet Take 100 mg by mouth daily.        . valsartan (DIOVAN) 80 MG tablet Take 80 mg by mouth daily.        Marland Kitchen zolpidem (AMBIEN) 10 MG tablet Take 10 mg by mouth as needed.        . loperamide (IMODIUM A-D) 2 MG tablet Take 2 mg by mouth as needed.        . ARIPiprazole (ABILIFY) 2 MG tablet Take 2 mg by mouth daily.        Marland Kitchen buPROPion (WELLBUTRIN XL) 300 MG 24 hr tablet Take 300 mg by mouth daily.        Marland Kitchen doxycycline (MONODOX) 100 MG capsule Take 1 capsule (100 mg total) by mouth 2 (two) times daily.  20 capsule  0   Past Medical History  Diagnosis Date  . Microscopic colitis   . Obesity   . HLD (hyperlipidemia)     takes Lipitor daily  .  COPD (chronic obstructive pulmonary disease)   . Asthma   . ED (erectile dysfunction)   . Diverticulosis 01/2007  . Internal hemorrhoids 01/2007  . Cataract   . HTN (hypertension)     takes Altace and Diovan daily  . Sleep apnea     uses CPAP;has been greater than 71yrs since sleep study  . Pneumonia 3/09  . Arthritis   . Joint pain   . Joint swelling   . GERD (gastroesophageal reflux disease)     uses Tums  . History of colon polyps   . Nocturia   . DM (diabetes mellitus)     takes Metformin 1000mg  bid  . Impaired hearing     "getting this checked out on Monday"  . Anxiety     Xanax as needed  . Depression     takes Cymbalta daily  . Insomnia   . Crohn's colitis    Past Surgical History  Procedure Date  . Umbilical hernia repair 5+yrs ago  . Sleep apnea surgery 1997  . Colonoscopy w/ biopsies 2008    Looks normal, biopsy suggested a chronic microscopic colitis with rare granulomas  . Nose surgery 2011  . Colonoscopy w/ biopsies 02/2011    diverticulosis, internal hemorrhoids, patchy chronic colitis on random bxs,   .  Tonsillectomy     as a child  . Cholecystectomy 2011  . Cataract surgery 2011  . Penile prosthesis  removal 2012  . Shoulder arthroscopy 08/24/2011    Procedure: ARTHROSCOPY SHOULDER;  Surgeon: Metta Clines Supple;  Location: [facility];  Service: Orthopedics;  Laterality: Right;  RIGHT SHOULDER ARTHROSCOPY WITH SUBACROMINAL DECOMPRESSION AND DISTAL CLAVICAL RESECTION    History   Social History  . Marital Status: Single-separated in the midst of divorce     Spouse Name: N/A    Number of Children: N/A  . Years of Education: N/A   Social History Main Topics  . Smoking status: Never Smoker   . Smokeless tobacco: Never Used  . Alcohol Use: No  . Drug Use: No  . Sexually Active: Yes   Other Topics Concern  . 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 Microscopic colitis   2. Diarrhea    Is having a flare. I have not been able to tell how much his IBS or how much is actual colitis. He is certainly under situational stress with a lengthy divorce. Biopsies have suggested Crohn's disease because of granulomas. He has seemed to respond best Xifaxan in the past. Entocort was not helpful.  1. Discontinue loperamide 2. Lomotil 1-2 every 6 hours as needed #9 be no refills 3. Xifaxan 550 mg 3 times a day for 10 days 4.  He is to call me in 2 weeks with a followup report, further plans pending response to therapy. 5. Will consider starting mesalamine if he does  not respond to this therapy 6. I have also asked  him to submit stool specimen for fecal lactoferrin today to look for inflammation   CC: Janalyn Rouse, MD

## 2012-03-19 NOTE — Patient Instructions (Addendum)
No allergy to soy or eggs.    Stop your Imodium and replace it with the lomotil for the diarrhea.  We have faxed that Rx to your pharmacy.  We have sent the following medications to your pharmacy for you to pick up at your convenience: Xifaxin.  Take one tablet three times a day for ten days, save the rest of tablets for later.  Call us back in two weeks with an update on your condition.  Thank you for choosing me and [facility].  Gatha Mayer, M.D., [facility]

## 2012-04-12 DIAGNOSIS — H35369 Drusen (degenerative) of macula, unspecified eye: Secondary | ICD-10-CM | POA: Diagnosis not present

## 2012-04-12 DIAGNOSIS — H43399 Other vitreous opacities, unspecified eye: Secondary | ICD-10-CM | POA: Diagnosis not present

## 2012-04-12 DIAGNOSIS — E119 Type 2 diabetes mellitus without complications: Secondary | ICD-10-CM | POA: Diagnosis not present

## 2012-04-18 DIAGNOSIS — N529 Male erectile dysfunction, unspecified: Secondary | ICD-10-CM | POA: Diagnosis not present

## 2012-04-19 DIAGNOSIS — L253 Unspecified contact dermatitis due to other chemical products: Secondary | ICD-10-CM | POA: Diagnosis not present

## 2012-04-19 DIAGNOSIS — L578 Other skin changes due to chronic exposure to nonionizing radiation: Secondary | ICD-10-CM | POA: Diagnosis not present

## 2012-04-19 DIAGNOSIS — L570 Actinic keratosis: Secondary | ICD-10-CM | POA: Diagnosis not present

## 2012-05-08 DIAGNOSIS — H5460 Unqualified visual loss, one eye, unspecified: Secondary | ICD-10-CM | POA: Diagnosis not present

## 2012-05-08 DIAGNOSIS — H43399 Other vitreous opacities, unspecified eye: Secondary | ICD-10-CM | POA: Diagnosis not present

## 2012-05-22 DIAGNOSIS — F329 Major depressive disorder, single episode, unspecified: Secondary | ICD-10-CM | POA: Diagnosis not present

## 2012-05-22 DIAGNOSIS — Z23 Encounter for immunization: Secondary | ICD-10-CM | POA: Diagnosis not present

## 2012-05-22 DIAGNOSIS — G3184 Mild cognitive impairment, so stated: Secondary | ICD-10-CM | POA: Diagnosis not present

## 2012-06-05 DIAGNOSIS — N529 Male erectile dysfunction, unspecified: Secondary | ICD-10-CM | POA: Diagnosis not present

## 2012-06-05 DIAGNOSIS — N4889 Other specified disorders of penis: Secondary | ICD-10-CM | POA: Diagnosis not present

## 2012-06-12 DIAGNOSIS — I10 Essential (primary) hypertension: Secondary | ICD-10-CM | POA: Diagnosis not present

## 2012-06-12 DIAGNOSIS — E785 Hyperlipidemia, unspecified: Secondary | ICD-10-CM | POA: Diagnosis not present

## 2012-06-12 DIAGNOSIS — E1129 Type 2 diabetes mellitus with other diabetic kidney complication: Secondary | ICD-10-CM | POA: Diagnosis not present

## 2012-06-12 DIAGNOSIS — Z125 Encounter for screening for malignant neoplasm of prostate: Secondary | ICD-10-CM | POA: Diagnosis not present

## 2012-06-21 DIAGNOSIS — Z0000 Encounter for general adult medical examination without abnormal findings: Secondary | ICD-10-CM | POA: Diagnosis not present

## 2012-06-21 DIAGNOSIS — E079 Disorder of thyroid, unspecified: Secondary | ICD-10-CM | POA: Diagnosis not present

## 2012-06-21 DIAGNOSIS — E1129 Type 2 diabetes mellitus with other diabetic kidney complication: Secondary | ICD-10-CM | POA: Diagnosis not present

## 2012-06-21 DIAGNOSIS — I10 Essential (primary) hypertension: Secondary | ICD-10-CM | POA: Diagnosis not present

## 2012-06-21 DIAGNOSIS — Z23 Encounter for immunization: Secondary | ICD-10-CM | POA: Diagnosis not present

## 2012-06-21 DIAGNOSIS — E785 Hyperlipidemia, unspecified: Secondary | ICD-10-CM | POA: Diagnosis not present

## 2012-06-21 DIAGNOSIS — N183 Chronic kidney disease, stage 3 unspecified: Secondary | ICD-10-CM | POA: Diagnosis not present

## 2012-06-24 DIAGNOSIS — Z1212 Encounter for screening for malignant neoplasm of rectum: Secondary | ICD-10-CM | POA: Diagnosis not present

## 2012-07-12 DIAGNOSIS — I10 Essential (primary) hypertension: Secondary | ICD-10-CM | POA: Diagnosis not present

## 2012-07-12 DIAGNOSIS — J069 Acute upper respiratory infection, unspecified: Secondary | ICD-10-CM | POA: Diagnosis not present

## 2012-07-12 DIAGNOSIS — E1129 Type 2 diabetes mellitus with other diabetic kidney complication: Secondary | ICD-10-CM | POA: Diagnosis not present

## 2012-08-24 ENCOUNTER — Emergency Department (HOSPITAL_BASED_OUTPATIENT_CLINIC_OR_DEPARTMENT_OTHER)
Admission: EM | Admit: 2012-08-24 | Discharge: 2012-08-24 | Disposition: A | Discharge status: Home / Self Care | Payer: Medicare Other | Attending: Emergency Medicine | Admitting: Emergency Medicine

## 2012-08-24 ENCOUNTER — Encounter (HOSPITAL_BASED_OUTPATIENT_CLINIC_OR_DEPARTMENT_OTHER): Payer: Self-pay | Admitting: *Deleted

## 2012-08-24 DIAGNOSIS — Z79899 Other long term (current) drug therapy: Secondary | ICD-10-CM | POA: Diagnosis not present

## 2012-08-24 DIAGNOSIS — F411 Generalized anxiety disorder: Secondary | ICD-10-CM | POA: Diagnosis not present

## 2012-08-24 DIAGNOSIS — Z8719 Personal history of other diseases of the digestive system: Secondary | ICD-10-CM | POA: Insufficient documentation

## 2012-08-24 DIAGNOSIS — J45909 Unspecified asthma, uncomplicated: Secondary | ICD-10-CM | POA: Insufficient documentation

## 2012-08-24 DIAGNOSIS — E669 Obesity, unspecified: Secondary | ICD-10-CM | POA: Diagnosis not present

## 2012-08-24 DIAGNOSIS — J449 Chronic obstructive pulmonary disease, unspecified: Secondary | ICD-10-CM | POA: Diagnosis not present

## 2012-08-24 DIAGNOSIS — Z7982 Long term (current) use of aspirin: Secondary | ICD-10-CM | POA: Insufficient documentation

## 2012-08-24 DIAGNOSIS — Z8679 Personal history of other diseases of the circulatory system: Secondary | ICD-10-CM | POA: Diagnosis not present

## 2012-08-24 DIAGNOSIS — Z8701 Personal history of pneumonia (recurrent): Secondary | ICD-10-CM | POA: Diagnosis not present

## 2012-08-24 DIAGNOSIS — Y929 Unspecified place or not applicable: Secondary | ICD-10-CM | POA: Insufficient documentation

## 2012-08-24 DIAGNOSIS — Z8739 Personal history of other diseases of the musculoskeletal system and connective tissue: Secondary | ICD-10-CM | POA: Insufficient documentation

## 2012-08-24 DIAGNOSIS — Z87448 Personal history of other diseases of urinary system: Secondary | ICD-10-CM | POA: Diagnosis not present

## 2012-08-24 DIAGNOSIS — Z8669 Personal history of other diseases of the nervous system and sense organs: Secondary | ICD-10-CM | POA: Diagnosis not present

## 2012-08-24 DIAGNOSIS — E119 Type 2 diabetes mellitus without complications: Secondary | ICD-10-CM | POA: Insufficient documentation

## 2012-08-24 DIAGNOSIS — J4489 Other specified chronic obstructive pulmonary disease: Secondary | ICD-10-CM | POA: Insufficient documentation

## 2012-08-24 DIAGNOSIS — H9190 Unspecified hearing loss, unspecified ear: Secondary | ICD-10-CM | POA: Diagnosis not present

## 2012-08-24 DIAGNOSIS — E785 Hyperlipidemia, unspecified: Secondary | ICD-10-CM | POA: Diagnosis not present

## 2012-08-24 DIAGNOSIS — IMO0002 Reserved for concepts with insufficient information to code with codable children: Secondary | ICD-10-CM | POA: Insufficient documentation

## 2012-08-24 DIAGNOSIS — Z860100 Personal history of colon polyps, unspecified: Secondary | ICD-10-CM | POA: Insufficient documentation

## 2012-08-24 DIAGNOSIS — S0181XA Laceration without foreign body of other part of head, initial encounter: Secondary | ICD-10-CM

## 2012-08-24 DIAGNOSIS — S0180XA Unspecified open wound of other part of head, initial encounter: Secondary | ICD-10-CM | POA: Insufficient documentation

## 2012-08-24 DIAGNOSIS — F329 Major depressive disorder, single episode, unspecified: Secondary | ICD-10-CM | POA: Insufficient documentation

## 2012-08-24 DIAGNOSIS — F3289 Other specified depressive episodes: Secondary | ICD-10-CM | POA: Insufficient documentation

## 2012-08-24 DIAGNOSIS — G4730 Sleep apnea, unspecified: Secondary | ICD-10-CM | POA: Diagnosis not present

## 2012-08-24 DIAGNOSIS — Y939 Activity, unspecified: Secondary | ICD-10-CM | POA: Insufficient documentation

## 2012-08-24 DIAGNOSIS — I10 Essential (primary) hypertension: Secondary | ICD-10-CM | POA: Insufficient documentation

## 2012-08-24 MED ORDER — HYDROCODONE-ACETAMINOPHEN 5-325 MG PO TABS: 2.0000 | ORAL_TABLET | ORAL | Status: DC | PRN

## 2012-08-24 MED ORDER — TETANUS-DIPHTH-ACELL PERTUSSIS 5-2.5-18.5 LF-MCG/0.5 IM SUSP
0.5000 mL | Freq: Once | INTRAMUSCULAR | Status: AC
  Administered 2012-08-24: 0.5 mL via INTRAMUSCULAR
  Filled 2012-08-24: qty 0.5

## 2012-08-24 NOTE — ED Provider Notes (Signed)
Medical screening examination/treatment/procedure(s) were performed by non-physician practitioner and as supervising physician I was immediately available for consultation/collaboration.   Saddie Benders. Shamirah Ivan, MD 08/24/12 2316

## 2012-08-24 NOTE — ED Notes (Signed)
Pt came to the nurses station and asked, when is the doctor going to come see me? I explained to the patient that we had another MD come in and it wouldn't be much longer. RN notified.

## 2012-08-24 NOTE — ED Notes (Addendum)
Pt states he hit his head on the edge of a wall this a.m. No LOC. Approx 1 cm lac noted to forehead. Bleeding controlled. PERL. H/A today.

## 2012-08-24 NOTE — ED Provider Notes (Signed)
History     CSN: [id number]  Arrival date & time 08/24/12  2036   First MD Initiated Contact with Patient 08/24/12 2308      Chief Complaint  Patient presents with  . Laceration    (Consider location/radiation/quality/duration/timing/severity/associated sxs/prior treatment) Patient is a 72 y.o. male presenting with skin laceration. The history is provided by the patient. No language interpreter was used.  Laceration  Incident onset: 6am. The laceration is located on the face. The laceration is 1 cm in size. Injury mechanism: a direct blow. The pain is at a severity of 6/10. The pain is moderate. The pain has been constant since onset. He reports no foreign bodies present. His tetanus status is unknown.  Pt reports he walked into a wall in the dark and hit forehead  Pt complains of a cut to his head  Past Medical History  Diagnosis Date  . Microscopic colitis   . Obesity   . HLD (hyperlipidemia)     takes Lipitor daily  . COPD (chronic obstructive pulmonary disease)   . Asthma   . ED (erectile dysfunction)   . Diverticulosis 01/2007  . Internal hemorrhoids 01/2007  . Cataract   . HTN (hypertension)     takes Altace and Diovan daily  . Sleep apnea     uses CPAP;has been greater than 39yrs since sleep study  . Pneumonia 3/09  . Arthritis   . Joint pain   . Joint swelling   . GERD (gastroesophageal reflux disease)     uses Tums  . History of colon polyps   . Nocturia   . DM (diabetes mellitus)     takes Metformin 1000mg  bid  . Impaired hearing     "getting this checked out on Monday"  . Anxiety     Xanax as needed  . Depression     takes Cymbalta daily  . Insomnia   . Crohn's colitis     Past Surgical History  Procedure Date  . Umbilical hernia repair 5+yrs ago  . Sleep apnea surgery 1997  . Colonoscopy w/ biopsies 2008    Looks normal, biopsy suggested a chronic microscopic colitis with rare granulomas  . Nose surgery 2011  . Colonoscopy w/ biopsies 02/2011      diverticulosis, internal hemorrhoids, patchy chronic colitis on random bxs,   . Tonsillectomy     as a child  . Cholecystectomy 2011  . Cataract surgery 2011  . Penile prosthesis  removal 2012  . Shoulder arthroscopy 08/24/2011    Procedure: ARTHROSCOPY SHOULDER;  Surgeon: Metta Clines Supple;  Location: [facility];  Service: Orthopedics;  Laterality: Right;  RIGHT SHOULDER ARTHROSCOPY WITH SUBACROMINAL DECOMPRESSION AND DISTAL CLAVICAL RESECTION     Family History  Problem Relation Age of Onset  . Esophageal cancer Paternal Grandmother   . Colon cancer Neg Hx     History  Substance Use Topics  . Smoking status: Never Smoker   . Smokeless tobacco: Never Used  . Alcohol Use: No      Review of Systems  Skin: Positive for wound.  All other systems reviewed and are negative.    Allergies  Shellfish allergy  Home Medications   Current Outpatient Rx  Name  Route  Sig  Dispense  Refill  . NIACIN ER (ANTIHYPERLIPIDEMIC) 1000 MG PO TBCR   Oral   Take 1,000 mg by mouth at bedtime.         Rulon Sera 18-103 MCG/ACT IN AERO   Inhalation  Inhale 2 puffs into the lungs as needed.           . ALPRAZOLAM 0.5 MG PO TABS               . ASPIRIN 81 MG PO TABS   Oral   Take 81 mg by mouth daily.           . ATORVASTATIN CALCIUM 10 MG PO TABS   Oral   Take 10 mg by mouth daily.           Marland Kitchen PEPTO-BISMOL PO   Oral   Take by mouth. Liquid will use 3-4 times a day          . TUMS PO   Oral   Take by mouth. 3-4 daily          . CELEBREX 200 MG PO CAPS               . FLUTICASONE-SALMETEROL 500-50 MCG/DOSE IN AEPB   Inhalation   Inhale 1 puff into the lungs 2 (two) times daily.           Marland Kitchen METFORMIN HCL ER (OSM) 1000 MG PO TB24   Oral   Take 1,000 mg by mouth 2 (two) times daily with a meal.           . MULTIVITAMIN PO   Oral   Take by mouth.           Marland Kitchen RAMIPRIL 10 MG PO TABS   Oral   Take 10 mg by mouth daily.           Marland Kitchen  RIFAXIMIN 550 MG PO TABS   Oral   Take 1 tablet (550 mg total) by mouth 2 (two) times daily.   30 tablet   0   . SERTRALINE HCL 100 MG PO TABS   Oral   Take 100 mg by mouth daily.           Marland Kitchen VALSARTAN 80 MG PO TABS   Oral   Take 80 mg by mouth daily.           Marland Kitchen ZOLPIDEM TARTRATE 10 MG PO TABS   Oral   Take 10 mg by mouth as needed.             BP 121/76  Pulse 71  Temp 98.3 F (36.8 C) (Oral)  Resp 18  Ht 5\' 5"  (1.651 m)  Wt 200 lb (90.719 kg)  BMI 33.28 kg/m2  SpO2 96%  Physical Exam  Nursing note and vitals reviewed. Constitutional: He is oriented to person, place, and time. He appears well-developed and well-nourished.  HENT:  Head: Normocephalic.       1cm laceration right forehead  Eyes: Conjunctivae normal and EOM are normal. Pupils are equal, round, and reactive to light.  Cardiovascular: Normal rate.   Pulmonary/Chest: Effort normal.  Neurological: He is alert and oriented to person, place, and time.  Skin: Skin is warm.  Psychiatric: He has a normal mood and affect.    ED Course  LACERATION REPAIR Date/Time: 08/24/2012 11:13 PM Performed by: Fransico Meadow Authorized by: Fransico Meadow Consent: Verbal consent obtained. Consent given by: patient Patient understanding: patient states understanding of the procedure being performed Patient identity confirmed: verbally with patient Time out: Immediately prior to procedure a "time out" was called to verify the correct patient, procedure, equipment, support staff and site/side marked as required. Body area: head/neck Laceration length: 1 cm Foreign bodies: no  foreign bodies Tendon involvement: none Skin closure: glue Technique: simple Patient tolerance: Patient tolerated the procedure well with no immediate complications.   (including critical care time)  Labs Reviewed - No data to display No results found.   1. Laceration of forehead       MDM         Fransico Meadow,  [place] 08/24/12 2316

## 2012-09-04 DIAGNOSIS — H35369 Drusen (degenerative) of macula, unspecified eye: Secondary | ICD-10-CM | POA: Diagnosis not present

## 2012-09-04 DIAGNOSIS — H43399 Other vitreous opacities, unspecified eye: Secondary | ICD-10-CM | POA: Diagnosis not present

## 2012-09-04 DIAGNOSIS — E119 Type 2 diabetes mellitus without complications: Secondary | ICD-10-CM | POA: Diagnosis not present

## 2012-09-05 DIAGNOSIS — M5030 Other cervical disc degeneration, unspecified cervical region: Secondary | ICD-10-CM | POA: Diagnosis not present

## 2012-09-10 DIAGNOSIS — E119 Type 2 diabetes mellitus without complications: Secondary | ICD-10-CM | POA: Diagnosis not present

## 2012-09-10 DIAGNOSIS — E789 Disorder of lipoprotein metabolism, unspecified: Secondary | ICD-10-CM | POA: Diagnosis not present

## 2012-09-10 DIAGNOSIS — I10 Essential (primary) hypertension: Secondary | ICD-10-CM | POA: Diagnosis not present

## 2012-09-11 DIAGNOSIS — Z79899 Other long term (current) drug therapy: Secondary | ICD-10-CM | POA: Diagnosis not present

## 2012-09-11 DIAGNOSIS — N486 Induration penis plastica: Secondary | ICD-10-CM | POA: Diagnosis not present

## 2012-09-11 DIAGNOSIS — N529 Male erectile dysfunction, unspecified: Secondary | ICD-10-CM | POA: Diagnosis not present

## 2012-09-17 DIAGNOSIS — R0989 Other specified symptoms and signs involving the circulatory and respiratory systems: Secondary | ICD-10-CM | POA: Diagnosis not present

## 2012-09-17 DIAGNOSIS — E789 Disorder of lipoprotein metabolism, unspecified: Secondary | ICD-10-CM | POA: Diagnosis not present

## 2012-09-17 DIAGNOSIS — I10 Essential (primary) hypertension: Secondary | ICD-10-CM | POA: Diagnosis not present

## 2012-09-17 DIAGNOSIS — R0609 Other forms of dyspnea: Secondary | ICD-10-CM | POA: Diagnosis not present

## 2012-09-25 DIAGNOSIS — L570 Actinic keratosis: Secondary | ICD-10-CM | POA: Diagnosis not present

## 2012-09-25 DIAGNOSIS — Z85828 Personal history of other malignant neoplasm of skin: Secondary | ICD-10-CM | POA: Diagnosis not present

## 2012-10-02 DIAGNOSIS — H43819 Vitreous degeneration, unspecified eye: Secondary | ICD-10-CM | POA: Diagnosis not present

## 2012-10-02 DIAGNOSIS — H43399 Other vitreous opacities, unspecified eye: Secondary | ICD-10-CM | POA: Diagnosis not present

## 2012-10-02 DIAGNOSIS — H5460 Unqualified visual loss, one eye, unspecified: Secondary | ICD-10-CM | POA: Diagnosis not present

## 2012-10-09 DIAGNOSIS — M5030 Other cervical disc degeneration, unspecified cervical region: Secondary | ICD-10-CM | POA: Diagnosis not present

## 2012-12-18 DIAGNOSIS — M545 Low back pain, unspecified: Secondary | ICD-10-CM | POA: Diagnosis not present

## 2012-12-18 DIAGNOSIS — M25569 Pain in unspecified knee: Secondary | ICD-10-CM | POA: Diagnosis not present

## 2012-12-18 DIAGNOSIS — M542 Cervicalgia: Secondary | ICD-10-CM | POA: Diagnosis not present

## 2013-01-24 DIAGNOSIS — E785 Hyperlipidemia, unspecified: Secondary | ICD-10-CM | POA: Diagnosis not present

## 2013-01-24 DIAGNOSIS — J209 Acute bronchitis, unspecified: Secondary | ICD-10-CM | POA: Diagnosis not present

## 2013-01-24 DIAGNOSIS — J45909 Unspecified asthma, uncomplicated: Secondary | ICD-10-CM | POA: Diagnosis not present

## 2013-01-24 DIAGNOSIS — E119 Type 2 diabetes mellitus without complications: Secondary | ICD-10-CM | POA: Diagnosis not present

## 2013-01-28 DIAGNOSIS — Z6831 Body mass index (BMI) 31.0-31.9, adult: Secondary | ICD-10-CM | POA: Diagnosis not present

## 2013-01-28 DIAGNOSIS — R0602 Shortness of breath: Secondary | ICD-10-CM | POA: Diagnosis not present

## 2013-01-28 DIAGNOSIS — J45901 Unspecified asthma with (acute) exacerbation: Secondary | ICD-10-CM | POA: Diagnosis not present

## 2013-02-17 DIAGNOSIS — R05 Cough: Secondary | ICD-10-CM | POA: Diagnosis not present

## 2013-02-17 DIAGNOSIS — I10 Essential (primary) hypertension: Secondary | ICD-10-CM | POA: Diagnosis not present

## 2013-02-17 DIAGNOSIS — R059 Cough, unspecified: Secondary | ICD-10-CM | POA: Diagnosis not present

## 2013-02-17 DIAGNOSIS — G4733 Obstructive sleep apnea (adult) (pediatric): Secondary | ICD-10-CM | POA: Diagnosis not present

## 2013-02-17 DIAGNOSIS — E669 Obesity, unspecified: Secondary | ICD-10-CM | POA: Diagnosis not present

## 2013-02-17 DIAGNOSIS — J45909 Unspecified asthma, uncomplicated: Secondary | ICD-10-CM | POA: Diagnosis not present

## 2013-02-27 ENCOUNTER — Encounter: Payer: Self-pay | Admitting: Pulmonary Disease

## 2013-02-27 ENCOUNTER — Ambulatory Visit (INDEPENDENT_AMBULATORY_CARE_PROVIDER_SITE_OTHER): Payer: Medicare Other | Admitting: Pulmonary Disease

## 2013-02-27 VITALS — BP 110/64 | HR 67 | Temp 99.1°F | Ht 68.0 in | Wt 209.5 lb

## 2013-02-27 DIAGNOSIS — J45909 Unspecified asthma, uncomplicated: Secondary | ICD-10-CM | POA: Diagnosis not present

## 2013-02-27 DIAGNOSIS — J449 Chronic obstructive pulmonary disease, unspecified: Secondary | ICD-10-CM

## 2013-02-27 MED ORDER — HYDROCOD POLST-CHLORPHEN POLST 10-8 MG/5ML PO LQCR: 5.0000 mL | Freq: Two times a day (BID) | ORAL | Status: DC | PRN

## 2013-02-27 NOTE — Progress Notes (Signed)
Subjective:    Patient ID: [name], male    DOB: 03-May-1941, 72 y.o.   MRN: [id number]  HPI  PCP - Lang Snow  42-year-old never smoker referred for evaluation of dyspnea and cough.  Pt reports had URI 3 weeks ago and took about 2 weeks to get better. He received Levaquin and a prednisone taper. His peak flow was in the 450 range. pt stated he is still SOB, cough w/ occasional yellow phlem, but denies wheezing. Dyspnea is worse with exertion or climbing stairs and with heat and humidity, improves with using Combivent  he reports asthma sinceH. 12 worsened by weather changes and exercise. He requires to 3 prednisone tapers per year, his last hospitalization was in 2006 for pneumonia complicated by Clostridium difficile infection. He is maintained on Advair and Combivent. He takes Advair only once at bedtime. He has been maintained on altace for at least 6 years. He reports a normal chest x-ray with Dr. Brigitte Pulse but this is not available to me at present. He reports a history of obstructive sleep apnea status post UPPP in 1997 and is compliant with his CPAP. 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  Past Medical History  Diagnosis Date  . Microscopic colitis   . Obesity   . HLD (hyperlipidemia)     takes Lipitor daily  . COPD (chronic obstructive pulmonary disease)   . Asthma   . ED (erectile dysfunction)   . Diverticulosis 01/2007  . Internal hemorrhoids 01/2007  . Cataract   . HTN (hypertension)     takes Altace and Diovan daily  . Sleep apnea     uses CPAP;has been greater than 20yrs since sleep study  . Pneumonia 3/09  . Arthritis   . Joint pain   . Joint swelling   . GERD (gastroesophageal reflux disease)     uses Tums  . History of colon polyps   . Nocturia   . DM (diabetes mellitus)     takes  Metformin 1000mg  bid  . Impaired hearing     "getting this checked out on Monday"  . Anxiety     Xanax as needed  . Depression     takes Cymbalta daily  . Insomnia   . Crohn's colitis     Past Surgical History  Procedure Laterality Date  . Umbilical hernia repair  5+yrs ago  . Sleep apnea surgery  1997  . Colonoscopy w/ biopsies  2008    Looks normal, biopsy suggested a chronic microscopic colitis with rare granulomas  . Nose surgery  2011  . Colonoscopy w/ biopsies  02/2011    diverticulosis, internal hemorrhoids, patchy chronic colitis on random bxs,   . Tonsillectomy      as a child  . Cholecystectomy  2011  . Cataract surgery  2011  . Penile prosthesis  removal  2012  . Shoulder arthroscopy  08/24/2011    Procedure: ARTHROSCOPY SHOULDER;  Surgeon: Metta Clines Supple;  Location: [facility];  Service: Orthopedics;  Laterality: Right;  RIGHT SHOULDER ARTHROSCOPY WITH SUBACROMINAL DECOMPRESSION AND DISTAL CLAVICAL RESECTION       Allergies  Allergen Reactions  . Shellfish Allergy     History   Social History  . Marital Status: Single    Spouse Name: N/A  Number of Children: N/A  . Years of Education: N/A   Occupational History  . Not on file.   Social History Main Topics  . Smoking status: Never Smoker   . Smokeless tobacco: Never Used  . Alcohol Use: No  . Drug Use: No  . Sexually Active: Yes   Other Topics Concern  . 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 Esophageal cancer Paternal Grandmother   . Colon cancer Neg Hx   . Heart failure Mother       Review of Systems  Constitutional: Positive for unexpected weight change. Negative for fever.  HENT: Positive for congestion. Negative for ear pain, nosebleeds, sore throat, rhinorrhea, sneezing, trouble swallowing, dental problem, postnasal drip and sinus  pressure.   Eyes: Negative for redness and itching.  Respiratory: Positive for cough and shortness of breath. Negative for chest tightness and wheezing.   Cardiovascular: Negative for palpitations and leg swelling.  Gastrointestinal: Negative for nausea and vomiting.  Genitourinary: Negative for dysuria.  Musculoskeletal: Negative for joint swelling.  Skin: Negative for rash.  Neurological: Negative for headaches.  Hematological: Does not bruise/bleed easily.  Psychiatric/Behavioral: Positive for dysphoric mood. The patient is nervous/anxious.        Objective:   Physical Exam  Gen. Pleasant, well-nourished, in no distress, normal affect ENT - no lesions, no post nasal drip Neck: No JVD, no thyromegaly, no carotid bruits Lungs: no use of accessory muscles, no dullness to percussion, clear without rales or rhonchi  Cardiovascular: Rhythm regular, heart sounds  normal, no murmurs or gallops, no peripheral edema Abdomen: soft and non-tender, no hepatosplenomegaly, BS normal. Musculoskeletal: No deformities, no cyanosis or clubbing Neuro:  alert, non focal        Assessment & Plan:

## 2013-02-27 NOTE — Patient Instructions (Addendum)
Your cough is likely post bronchitic Less likely , it may be related to altace Breathing test shows airway obstruction Increase advair twice daily Tussionex cough syrup 28ml twice daily as needed - may effect driving Take DELSYm daytime If cough persists x 6-8 weeks, may have to stop altace

## 2013-02-28 ENCOUNTER — Telehealth: Payer: Self-pay | Admitting: Pulmonary Disease

## 2013-02-28 DIAGNOSIS — G4733 Obstructive sleep apnea (adult) (pediatric): Secondary | ICD-10-CM

## 2013-02-28 NOTE — Telephone Encounter (Signed)
Returning call can be reached at [phone].[name]

## 2013-02-28 NOTE — Assessment & Plan Note (Signed)
Your cough is likely post bronchitic Less likely , it may be related to altace Breathing test shows airway obstruction Suggestive of persistent asthma Increase advair twice daily Tussionex cough syrup 12ml twice daily as needed - may effect driving Take DELSYm daytime If cough persists x 6-8 weeks, may have to stop altace - I would also repeat post bronchodilator testing in the future.

## 2013-02-28 NOTE — Telephone Encounter (Signed)
lmtcb x1 for pt. 

## 2013-02-28 NOTE — Telephone Encounter (Signed)
Pt stated Dr. Brigitte Pulse did not order sleep test. i have done so. Pt aware [facility] will contact him for appt. Nothing further was needed

## 2013-02-28 NOTE — Telephone Encounter (Signed)
He will need a sleep test - did Dr Brigitte Pulse do one for him already ? If not, we can schedule

## 2013-02-28 NOTE — Telephone Encounter (Signed)
I spoke with pt. He stated he has not had a new cpap machine in 7-8 yrs. He last had sleep study 1998. He does not have a current DME. The doctor that set him up with this was in [place] but is no longer practicing. Please advise Dr. Elsworth Soho thanks

## 2013-03-09 ENCOUNTER — Ambulatory Visit (HOSPITAL_BASED_OUTPATIENT_CLINIC_OR_DEPARTMENT_OTHER): Payer: Medicare Other | Attending: Pulmonary Disease

## 2013-03-09 VITALS — Ht 68.0 in | Wt 205.0 lb

## 2013-03-09 DIAGNOSIS — G4733 Obstructive sleep apnea (adult) (pediatric): Secondary | ICD-10-CM | POA: Insufficient documentation

## 2013-03-17 DIAGNOSIS — I10 Essential (primary) hypertension: Secondary | ICD-10-CM | POA: Diagnosis not present

## 2013-03-17 DIAGNOSIS — R0609 Other forms of dyspnea: Secondary | ICD-10-CM | POA: Diagnosis not present

## 2013-03-17 DIAGNOSIS — E119 Type 2 diabetes mellitus without complications: Secondary | ICD-10-CM | POA: Diagnosis not present

## 2013-03-18 DIAGNOSIS — G4737 Central sleep apnea in conditions classified elsewhere: Secondary | ICD-10-CM

## 2013-03-18 DIAGNOSIS — G4733 Obstructive sleep apnea (adult) (pediatric): Secondary | ICD-10-CM

## 2013-03-19 NOTE — Procedures (Signed)
NAME:  [name], [name] NO.:  [id number]  MEDICAL RECORD NO.:  [id number]          PATIENT TYPE:  OUT  LOCATION:  SLEEP CENTER                 FACILITY:  [facility]  PHYSICIAN:  Rigoberto Noel, MD      DATE OF BIRTH:  25-May-1941  DATE OF STUDY:  03/09/2013                           NOCTURNAL POLYSOMNOGRAM  REFERRING PHYSICIAN:  Rigoberto Noel, MD  INDICATION FOR STUDY:  Mr. Duhn is a 72 year old gentleman with obstructive sleep apnea.  He underwent UPPP in 1997 and is maintained on CPAP.  This study was performed due to change in symptoms and weight.  At the time of this study, he weighed 205 pounds with a height of 5 feet 8 inches, BMI of 31, neck size of 17 inches.  EPWORTH SLEEPINESS SCORE:  13.  At this time, polysomnogram was performed with a sleep technologist in attendance.  EEG, EOG, EMG, EKG, and respiratory parameters were recorded.  Sleep stages, arousals, limb movements, and respiratory data was scored according to criteria laid out by the American Academy of Sleep Medicine.  SLEEP ARCHITECTURE:  Lights out was at 10:56 p.m., lights on was at 5:03 a.m., total sleep time was 206 minutes with a sleep period time of 360 minutes and a sleep efficiency of 56%.  Sleep latency was 7 minutes, latency to REM sleep was 214 minutes, and wake after sleep onset was 153 minutes.  Sleep stages as the percentage of total sleep time was N1 57%, N2 36%, N3 0%, and REM sleep 7.5%.  Supine sleep accounted for 206 minutes and supine REM sleep was noted for 15 minutes.  Longest period of REM sleep was around 3 a.m.  AROUSAL DATA:  There were 91 arousals with an arousal index of 26 events per hour.  Of these, 47 were spontaneous and the rest were associated with limb and respiratory events.  RESPIRATORY DATA:  There were total of 2 obstructive apneas, 60 central apneas, 0 mixed apneas, and 45 hypopneas with an apnea-hypopnea index of 31 events per hour.  Longest hypopnea was  34 seconds.  MOVEMENT-PARASOMNIA:  The PLM index was 23 events per hour, however, the PLM arousal index was only 3 events per hour.  OXYGEN DATA:  The lowest desaturation was 73% during REM sleep.  The desaturation index was 37 events per hour and he spent 10 minutes with a saturation less than 88%.  CARDIAC DATA:  The low heart rate was 30 beats per minute and the high heart rate recorded was an artifact.  No arrhythmias were noted.  IMPRESSIONS: 1. Moderate obstructive sleep apnea with associated central apneas,     causing sleep fragmentation and moderate oxygen desaturation. 2. Few PLMs.  The significance of this is unclear, these were mostly     not associated with arousals. 3. No evidence of cardiac arrhythmias or behavioral disturbance during     sleep.  RECOMMENDATIONS: 1. The treatment options for this degree of sleep-disordered breathing     include weight loss and CPAP therapy.  Would recommend a CPAP     titration in the lab given presence of central apneas, to ensure the  titration is adequate. 2. He should be asked to avoid medications with sedative side effects.     He should be cautioned against driving when sleepy.     Rigoberto Noel, MD    RVA/MEDQ  D:  03/18/2013 12:18:19  T:  03/19/2013 01:48:56  Job:  [id number]

## 2013-03-25 DIAGNOSIS — L570 Actinic keratosis: Secondary | ICD-10-CM | POA: Diagnosis not present

## 2013-03-25 DIAGNOSIS — Z85828 Personal history of other malignant neoplasm of skin: Secondary | ICD-10-CM | POA: Diagnosis not present

## 2013-03-27 ENCOUNTER — Ambulatory Visit (INDEPENDENT_AMBULATORY_CARE_PROVIDER_SITE_OTHER): Payer: Medicare Other | Admitting: Adult Health

## 2013-03-27 ENCOUNTER — Encounter: Payer: Self-pay | Admitting: Adult Health

## 2013-03-27 VITALS — BP 112/66 | HR 80 | Temp 98.2°F | Ht 68.25 in | Wt 209.2 lb

## 2013-03-27 DIAGNOSIS — J45909 Unspecified asthma, uncomplicated: Secondary | ICD-10-CM

## 2013-03-27 NOTE — Patient Instructions (Addendum)
Stop Ramipril.  Would avoid ACE Inhibitors in future.  Continue on Advair 1 puff Twice daily  -brush/rinse/gargle after use.  Check blood pressure 2-3 times a week, call if >140  follow up with family doctor for blood pressure as planned .  Use Combivent As needed  For wheezing/shortness of breath.  Please contact office for sooner follow up if symptoms do not improve or worsen or seek emergency care  follow up Dr. Elsworth Soho  In 2-3 months and As needed

## 2013-03-28 NOTE — Assessment & Plan Note (Signed)
Resolving exacerbation with recurrent cough  On both ACE and ARB , b/p under great control  Will stop ACE -would avoid in future if possible as prone to asthma flares  Plan  Stop Ramipril.  Would avoid ACE Inhibitors in future.  Continue on Advair 1 puff Twice daily  -brush/rinse/gargle after use.  Check blood pressure 2-3 times a week, call if >140  follow up with family doctor for blood pressure as planned .  Use Combivent As needed  For wheezing/shortness of breath.  Please contact office for sooner follow up if symptoms do not improve or worsen or seek emergency care  follow up Dr. Elsworth Soho  In 2-3 months and As needed

## 2013-03-28 NOTE — Progress Notes (Signed)
Subjective:    Patient ID: [name], male    DOB: [birth year], 72 y.o.   MRN: [id number]  HPI  PCP - Lang Snow  43 -year-old never smoker referred for evaluation of dyspnea and cough with initial pulmonary consult 02/27/13   02/27/13 IOV  Pt reports had URI 3 weeks ago and took about 2 weeks to get better. He received Levaquin and a prednisone taper. His peak flow was in the 450 range. pt stated he is still SOB, cough w/ occasional yellow phlem, but denies wheezing. Dyspnea is worse with exertion or climbing stairs and with heat and humidity, improves with using Combivent  he reports asthma sinceH. 12 worsened by weather changes and exercise. He requires to 3 prednisone tapers per year, his last hospitalization was in 2006 for pneumonia complicated by Clostridium difficile infection. He is maintained on Advair and Combivent. He takes Advair only once at bedtime. He has been maintained on altace for at least 6 years. He reports a normal chest x-ray with Dr. Brigitte Pulse but this is not available to me at present. He reports a history of obstructive sleep apnea status post UPPP in 1997 and is compliant with his CPAP. 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 >>Advair increased Twice daily    03/27/13 Follow up  Returns for 1 month follow up - reports cough is 60-70% improved with taking Advair BID. Seen last ov for pulmonary consult for persistent cough and slow to resolve asthma flare . Spirometry showed airflow obstruction. Rec to increase advair Twice daily  .  He is feeling better but cough has not resolved totally and has on/off wheezing.  No fever, discolored mucus, edema or hemoptysis, wt loss or orthopnea. Never smoker.  Remains on ACE and ARB.   Past Medical History  Diagnosis Date  . Microscopic colitis   .  Obesity   . HLD (hyperlipidemia)     takes Lipitor daily  . COPD (chronic obstructive pulmonary disease)   . Asthma   . ED (erectile dysfunction)   . Diverticulosis 01/2007  . Internal hemorrhoids 01/2007  . Cataract   . HTN (hypertension)     takes Altace and Diovan daily  . Sleep apnea     uses CPAP;has been greater than 69yrs since sleep study  . Pneumonia 3/09  . Arthritis   . Joint pain   . Joint swelling   . GERD (gastroesophageal reflux disease)     uses Tums  . History of colon polyps   . Nocturia   . DM (diabetes mellitus)     takes Metformin 1000mg  bid  . Impaired hearing     "getting this checked out on Monday"  . Anxiety     Xanax as needed  . Depression     takes Cymbalta daily  . Insomnia   . Crohn's colitis     Past Surgical History  Procedure Laterality Date  . Umbilical hernia repair  5+yrs ago  . Sleep apnea surgery  1997  . Colonoscopy w/ biopsies  2008    Looks normal, biopsy suggested a chronic microscopic colitis with rare granulomas  . Nose surgery  2011  . Colonoscopy w/ biopsies  02/2011    diverticulosis, internal hemorrhoids, patchy chronic colitis on random bxs,   . Tonsillectomy  as a child  . Cholecystectomy  2011  . Cataract surgery  2011  . Penile prosthesis  removal  2012  . Shoulder arthroscopy  08/24/2011    Procedure: ARTHROSCOPY SHOULDER;  Surgeon: Metta Clines Supple;  Location: [facility];  Service: Orthopedics;  Laterality: Right;  RIGHT SHOULDER ARTHROSCOPY WITH SUBACROMINAL DECOMPRESSION AND DISTAL CLAVICAL RESECTION       Allergies  Allergen Reactions  . Shellfish Allergy     History   Social History  . Marital Status: Single    Spouse Name: N/A    Number of Children: N/A  . Years of Education: N/A   Occupational History  . Not on file.   Social History Main Topics  . Smoking status: Never Smoker   . Smokeless tobacco: Never Used  . Alcohol Use: No  . Drug Use: No  . Sexually Active: Yes   Other Topics  Concern  . 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 Esophageal cancer Paternal Grandmother   . Colon cancer Neg Hx   . Heart failure Mother       Review of Systems  Constitutional:   No  weight loss, night sweats,  Fevers, chills, fatigue, or  lassitude.  HEENT:   No headaches,  Difficulty swallowing,  Tooth/dental problems, or  Sore throat,                No sneezing, itching, ear ache, nasal congestion, post nasal drip,   CV:  No chest pain,  Orthopnea, PND, swelling in lower extremities, anasarca, dizziness, palpitations, syncope.   GI  No heartburn, indigestion, abdominal pain, nausea, vomiting, diarrhea, change in bowel habits, loss of appetite, bloody stools.   Resp:    No chest wall deformity  Skin: no rash or lesions.  GU: no dysuria, change in color of urine, no urgency or frequency.  No flank pain, no hematuria   MS:  No joint pain or swelling.  No decreased range of motion.  No back pain.  Psych:  No change in mood or affect. No depression or anxiety.  No memory loss.          Objective:   Physical Exam  Gen. Pleasant, well-nourished, in no distress, normal affect ENT - no lesions, no post nasal drip Neck: No JVD, no thyromegaly, no carotid bruits Lungs: no use of accessory muscles, no dullness to percussion, clear without rales or rhonchi  Cardiovascular: Rhythm regular, heart sounds  normal, no murmurs or gallops, no peripheral edema Abdomen: soft and non-tender, no hepatosplenomegaly, BS normal. Musculoskeletal: No deformities, no cyanosis or clubbing Neuro:  alert, non focal        Assessment & Plan:

## 2013-04-02 ENCOUNTER — Telehealth: Payer: Self-pay | Admitting: Pulmonary Disease

## 2013-04-02 DIAGNOSIS — G4733 Obstructive sleep apnea (adult) (pediatric): Secondary | ICD-10-CM

## 2013-04-02 NOTE — Telephone Encounter (Signed)
LMOMTCB x1 for pt 

## 2013-04-02 NOTE — Telephone Encounter (Signed)
Pl let him know study showed OSA  -needs CPAP titration study in the lab since central apneas noted. Alternatively Can do home autotitration but may not be as accurate

## 2013-04-02 NOTE — Telephone Encounter (Signed)
I spoke with patient about results and he verbalized understanding and had no questions. Pt agree to have CPAP titration study in lab

## 2013-04-02 NOTE — Telephone Encounter (Signed)
Pt returned triage's call.  Holly D Pryor ° °

## 2013-04-02 NOTE — Telephone Encounter (Signed)
Sleep study done 03/09/13. Dr. Elsworth Soho I have not tried calling pt have you. thanks

## 2013-04-29 DIAGNOSIS — Z23 Encounter for immunization: Secondary | ICD-10-CM | POA: Diagnosis not present

## 2013-05-05 ENCOUNTER — Ambulatory Visit (HOSPITAL_BASED_OUTPATIENT_CLINIC_OR_DEPARTMENT_OTHER): Payer: Medicare Other | Attending: Pulmonary Disease | Admitting: Radiology

## 2013-05-05 VITALS — Ht 68.0 in | Wt 205.0 lb

## 2013-05-05 DIAGNOSIS — G4733 Obstructive sleep apnea (adult) (pediatric): Secondary | ICD-10-CM

## 2013-05-13 DIAGNOSIS — H35369 Drusen (degenerative) of macula, unspecified eye: Secondary | ICD-10-CM | POA: Diagnosis not present

## 2013-05-13 DIAGNOSIS — E119 Type 2 diabetes mellitus without complications: Secondary | ICD-10-CM | POA: Diagnosis not present

## 2013-05-13 DIAGNOSIS — H43399 Other vitreous opacities, unspecified eye: Secondary | ICD-10-CM | POA: Diagnosis not present

## 2013-05-16 ENCOUNTER — Telehealth: Payer: Self-pay | Admitting: Pulmonary Disease

## 2013-05-16 DIAGNOSIS — G4733 Obstructive sleep apnea (adult) (pediatric): Secondary | ICD-10-CM

## 2013-05-16 NOTE — Telephone Encounter (Signed)
Pt came by the office. This has been explained to him.  ROV has been scheduled for 07/09/13 at 1:30p.  Order needs to be sent to [facility].

## 2013-05-16 NOTE — Telephone Encounter (Addendum)
CPAP titration reviewed, needed 15 cm to correct, but had few arousals at this pressure. Hence bilevel tried Suggest auto CPAP 10 - 15 cm, med FF mask, humidity, download & FU in 1 mnth

## 2013-05-16 NOTE — Procedures (Signed)
NAME:  [name], TREON NO.:  [id number]  MEDICAL RECORD NO.:  [id number]          PATIENT TYPE:  OUT  LOCATION:  SLEEP CENTER                 FACILITY:  [facility]  PHYSICIAN:  Rigoberto Noel, MD      DATE OF BIRTH:  1941-07-17  DATE OF STUDY:  05/16/2013                           NOCTURNAL POLYSOMNOGRAM  REFERRING PHYSICIAN:  Rigoberto Noel, MD  INDICATION FOR STUDY:  Mr. [name] is a 72 year old gentleman with obstructive sleep apnea diagnosed many years ago.  He is status post UPPP in 1997, and compliant with CPAP.  This study was performed to the aggressive CPAP need given his weight changes and symptoms.  At the time of this study, he weighed 205 pounds with a height of 5 feet 8 inches, BMI of 31, neck size of 17 inches.  Epworth sleepiness score of 18.  This CPAP titration study was performed with a sleep technologist in attendance.  EEG, EOG, EMG, EKG, and respiratory parameters were recorded.  Sleep stages, arousals, limb movements, and respiratory data were scored according to criteria laid out by the American Academy of Sleep of Medicine.  EPWORTH SLEEPINESS SCORE:  SLEEP ARCHITECTURE:  Lights out was at 9:22 p.m., lights on was at 5:02 a.m.  Total sleep time was 3:52 minutes with a sleep period time of 442 minutes and a sleep efficiency of 77%, sleep latency was 15 minutes. Latency to REM sleep was 282 minutes and wake after sleep onset was 94 minutes.  Sleep stages as the percentage of total sleep time was N1 21%, N2 78%, N3 0%, and REM sleep 0.9%. Supine sleep accounted for 311 minutes.  AROUSAL DATA:  There were 284 arousals with an arousal index of 48 events per hour. Most of the arousals were spontaneous.  RESPIRATORY DATA:  CPAP was initiated at 5 cm with a medium full-face mask and titrated to a final level of 15 cm.  At this level for 105 minutes of sleep including 3 minutes of REM sleep, 2 obstructive apneas were noted with a lowest desaturation  of 93%.  However, frequent arousals were noted.  Hence, he was changed to BiPAP which was titrated to 19 or 15 cm.  At this level for 10 minutes, 2 obstructive apneas and 1 central apneas were noted.  Central apneas seem to emerged at higher pressures on BiPAP.  LIMB MOVEMENT DATA:  The PLM index was 63 events per hour.  The PLM arousal index was 9.5 events per hour.  OXYGEN SATURATION DATA:  The desaturation index was 7 events per hour with the lowest desaturation of 87%.  He spent 0.1 minutes with a saturation less than 88%.  CARDIAC DATA:  The low heart rate was 49 beats per minute.  The high heart rate recorded was an artifact.  No arrhythmias were noted.  DISCUSSION:  He was desensitized with a medium full-face mask.  CPAP was titrated for respiratory events and RERAs. Due to frequent arousals, CPAP was changed to bilevel.   IMPRESSIONS-RECOMMENDATIONS: 1. Moderate-to-severe obstructive sleep apnea with hypopneas causing     sleep, sleep fragmentation and oxygen desaturation. 2. This was corrected by CPAP of 15  cm.  However, frequent arousals     were noted at this level and hence BiPAP was initiated. 3. Frequent PLMs were noted, which were associated with arousals.     Please correlate with clinical history of restless legs syndrome. 4. No evidence of cardiac arrhythmias, or behavioral disturbance     during sleep.  RECOMMEND: 1. Treatment options for this degree of sleep-disordered breathing     include CPAP therapy and weight loss. 2. CPAP can be initiated at 15 cm with a medium full-face mask.     Alternatively, an auto CPAP between 10 and 15 cm can be tried.  If     the patient does not tolerate this, he will qualify for a bilevel     therapy. 3. Consider treatment for restless legs syndrome if he is symptomatic. 4. He should be cautioned against driving when sleepy.  He should be     asked to avoid medications with sedative side effects.     Rigoberto Noel,  MD    RVA/MEDQ  D:  05/16/2013 13:04:00  T:  05/16/2013 23:00:47  Job:  [id number]

## 2013-05-16 NOTE — Telephone Encounter (Signed)
lmomtcb x1 for pt 

## 2013-05-19 DIAGNOSIS — K521 Toxic gastroenteritis and colitis: Secondary | ICD-10-CM | POA: Diagnosis not present

## 2013-05-20 DIAGNOSIS — Z79899 Other long term (current) drug therapy: Secondary | ICD-10-CM | POA: Diagnosis not present

## 2013-05-20 DIAGNOSIS — J449 Chronic obstructive pulmonary disease, unspecified: Secondary | ICD-10-CM | POA: Diagnosis present

## 2013-05-20 DIAGNOSIS — Q619 Cystic kidney disease, unspecified: Secondary | ICD-10-CM | POA: Diagnosis not present

## 2013-05-20 DIAGNOSIS — N179 Acute kidney failure, unspecified: Secondary | ICD-10-CM | POA: Diagnosis not present

## 2013-05-20 DIAGNOSIS — E860 Dehydration: Secondary | ICD-10-CM | POA: Diagnosis not present

## 2013-05-20 DIAGNOSIS — R0789 Other chest pain: Secondary | ICD-10-CM | POA: Diagnosis not present

## 2013-05-20 DIAGNOSIS — R197 Diarrhea, unspecified: Secondary | ICD-10-CM | POA: Diagnosis not present

## 2013-05-20 DIAGNOSIS — Z9889 Other specified postprocedural states: Secondary | ICD-10-CM | POA: Diagnosis not present

## 2013-05-20 DIAGNOSIS — R112 Nausea with vomiting, unspecified: Secondary | ICD-10-CM | POA: Diagnosis not present

## 2013-05-20 DIAGNOSIS — I129 Hypertensive chronic kidney disease with stage 1 through stage 4 chronic kidney disease, or unspecified chronic kidney disease: Secondary | ICD-10-CM | POA: Diagnosis not present

## 2013-05-20 DIAGNOSIS — N320 Bladder-neck obstruction: Secondary | ICD-10-CM | POA: Diagnosis not present

## 2013-05-20 DIAGNOSIS — R918 Other nonspecific abnormal finding of lung field: Secondary | ICD-10-CM | POA: Diagnosis not present

## 2013-05-20 DIAGNOSIS — N329 Bladder disorder, unspecified: Secondary | ICD-10-CM | POA: Diagnosis present

## 2013-05-20 DIAGNOSIS — R109 Unspecified abdominal pain: Secondary | ICD-10-CM | POA: Diagnosis not present

## 2013-05-20 DIAGNOSIS — Z7982 Long term (current) use of aspirin: Secondary | ICD-10-CM | POA: Diagnosis not present

## 2013-05-20 DIAGNOSIS — N189 Chronic kidney disease, unspecified: Secondary | ICD-10-CM | POA: Diagnosis not present

## 2013-05-20 DIAGNOSIS — I10 Essential (primary) hypertension: Secondary | ICD-10-CM | POA: Diagnosis not present

## 2013-05-20 DIAGNOSIS — N19 Unspecified kidney failure: Secondary | ICD-10-CM | POA: Diagnosis not present

## 2013-05-20 DIAGNOSIS — E119 Type 2 diabetes mellitus without complications: Secondary | ICD-10-CM | POA: Diagnosis not present

## 2013-05-29 DIAGNOSIS — E119 Type 2 diabetes mellitus without complications: Secondary | ICD-10-CM | POA: Diagnosis not present

## 2013-05-29 DIAGNOSIS — Z961 Presence of intraocular lens: Secondary | ICD-10-CM | POA: Diagnosis not present

## 2013-06-05 DIAGNOSIS — N179 Acute kidney failure, unspecified: Secondary | ICD-10-CM | POA: Diagnosis not present

## 2013-06-05 DIAGNOSIS — I10 Essential (primary) hypertension: Secondary | ICD-10-CM | POA: Diagnosis not present

## 2013-06-17 DIAGNOSIS — E1129 Type 2 diabetes mellitus with other diabetic kidney complication: Secondary | ICD-10-CM | POA: Diagnosis not present

## 2013-06-17 DIAGNOSIS — Z125 Encounter for screening for malignant neoplasm of prostate: Secondary | ICD-10-CM | POA: Diagnosis not present

## 2013-06-21 DIAGNOSIS — Z6831 Body mass index (BMI) 31.0-31.9, adult: Secondary | ICD-10-CM | POA: Diagnosis not present

## 2013-06-21 DIAGNOSIS — N179 Acute kidney failure, unspecified: Secondary | ICD-10-CM | POA: Diagnosis not present

## 2013-06-21 DIAGNOSIS — I10 Essential (primary) hypertension: Secondary | ICD-10-CM | POA: Diagnosis not present

## 2013-06-21 DIAGNOSIS — E1129 Type 2 diabetes mellitus with other diabetic kidney complication: Secondary | ICD-10-CM | POA: Diagnosis not present

## 2013-07-09 ENCOUNTER — Encounter: Payer: Self-pay | Admitting: Pulmonary Disease

## 2013-07-09 ENCOUNTER — Ambulatory Visit (INDEPENDENT_AMBULATORY_CARE_PROVIDER_SITE_OTHER): Payer: Medicare Other | Admitting: Pulmonary Disease

## 2013-07-09 VITALS — BP 150/100 | HR 72 | Temp 98.1°F | Ht 68.0 in | Wt 200.8 lb

## 2013-07-09 DIAGNOSIS — G4730 Sleep apnea, unspecified: Secondary | ICD-10-CM

## 2013-07-09 DIAGNOSIS — J45909 Unspecified asthma, uncomplicated: Secondary | ICD-10-CM

## 2013-07-09 NOTE — Patient Instructions (Addendum)
Asthma appears better controlled on advair twice daily Stay on current settings CPAP Check with Dr [name] about your BP

## 2013-07-09 NOTE — Progress Notes (Signed)
   Subjective:    Patient ID: [name], male    DOB: May 04, 1941, 72 y.o.   MRN: [id number]  HPI  PCP - Lang Snow   23 -year-old never smoker referred for FU of dyspnea and cough with initial pulmonary consult 02/27/13   02/27/13 IOV  Pt reports had URI 3 weeks ago and took about 2 weeks to get better. He received Levaquin and a prednisone taper.  His peak flow was in the 450 range. pt stated he is still SOB, cough w/ occasional yellow phlem, but denies wheezing.  Dyspnea is worse with exertion or climbing stairs and with heat and humidity, improves with using Combivent  he reports asthma sinceH. 12 worsened by weather changes and exercise. He requires to 3 prednisone tapers per year, his last hospitalization was in 2006 for pneumonia complicated by Clostridium difficile infection. He is maintained on Advair and Combivent. He takes Advair only once at bedtime.  He has been maintained on altace for at least 6 years.  He reports a normal chest x-ray with Dr. Brigitte Pulse . He reports a history of obstructive sleep apnea status post UPPP in 1997 and is compliant with his CPAP.  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  >>Advair increased Twice daily    07/09/2013  Altace stopped last visit Had food poisoning with renal failure & diovan was stopped BP high today - has FU OV with dr Brigitte Pulse on 12/2 PSG (205 lbs) showed severe OSA, predom hypopneas -AHI 31/h -needs CPAP titration study in the lab since central apneas noted CPAP titration reviewed, needed 15 cm to correct, but had few arousals at this pressure. Hence bilevel tried  Suggest auto CPAP 10 - 15 cm, med FF mask, humidity, download showed >>good usage 5.5 h, avg pr 10 cm, mod leak Spirometry (Off altace) nml ratio, FEv1 73%   Review of Systems neg for any  significant sore throat, dysphagia, itching, sneezing, nasal congestion or excess/ purulent secretions, fever, chills, sweats, unintended wt loss, pleuritic or exertional cp, hempoptysis, orthopnea pnd or change in chronic leg swelling. Also denies presyncope, palpitations, heartburn, abdominal pain, nausea, vomiting, diarrhea or change in bowel or urinary habits, dysuria,hematuria, rash, arthralgias, visual complaints, headache, numbness weakness or ataxia.     Objective:   Physical Exam  Gen. Pleasant, well-nourished, in no distress ENT - no lesions, no post nasal drip Neck: No JVD, no thyromegaly, no carotid bruits Lungs: no use of accessory muscles, no dullness to percussion, clear without rales or rhonchi  Cardiovascular: Rhythm regular, heart sounds  normal, no murmurs or gallops, no peripheral edema Musculoskeletal: No deformities, no cyanosis or clubbing        Assessment & Plan:

## 2013-07-09 NOTE — Assessment & Plan Note (Signed)
Change CPAP to 10 cm Ensure better seal on nasal mask Weight loss encouraged, compliance with goal of at least 4-6 hrs every night is the expectation. Advised against medications with sedative side effects Cautioned against driving when sleepy - understanding that sleepiness will vary on a day to day basis

## 2013-07-09 NOTE — Assessment & Plan Note (Signed)
Asthma appears better controlled on advair twice daily Improved lung function Check with Dr Brigitte Pulse about your BP

## 2013-07-15 DIAGNOSIS — E079 Disorder of thyroid, unspecified: Secondary | ICD-10-CM | POA: Diagnosis not present

## 2013-07-15 DIAGNOSIS — Z23 Encounter for immunization: Secondary | ICD-10-CM | POA: Diagnosis not present

## 2013-07-15 DIAGNOSIS — E1129 Type 2 diabetes mellitus with other diabetic kidney complication: Secondary | ICD-10-CM | POA: Diagnosis not present

## 2013-07-15 DIAGNOSIS — N179 Acute kidney failure, unspecified: Secondary | ICD-10-CM | POA: Diagnosis not present

## 2013-07-15 DIAGNOSIS — I10 Essential (primary) hypertension: Secondary | ICD-10-CM | POA: Diagnosis not present

## 2013-07-15 DIAGNOSIS — Z0000 Encounter for general adult medical examination without abnormal findings: Secondary | ICD-10-CM | POA: Diagnosis not present

## 2013-07-15 DIAGNOSIS — Z79899 Other long term (current) drug therapy: Secondary | ICD-10-CM | POA: Diagnosis not present

## 2013-07-15 DIAGNOSIS — R5381 Other malaise: Secondary | ICD-10-CM | POA: Diagnosis not present

## 2013-07-15 DIAGNOSIS — E785 Hyperlipidemia, unspecified: Secondary | ICD-10-CM | POA: Diagnosis not present

## 2013-07-15 DIAGNOSIS — Z1331 Encounter for screening for depression: Secondary | ICD-10-CM | POA: Diagnosis not present

## 2013-07-15 DIAGNOSIS — J45909 Unspecified asthma, uncomplicated: Secondary | ICD-10-CM | POA: Diagnosis not present

## 2013-07-21 DIAGNOSIS — R0609 Other forms of dyspnea: Secondary | ICD-10-CM | POA: Diagnosis not present

## 2013-07-21 DIAGNOSIS — R509 Fever, unspecified: Secondary | ICD-10-CM | POA: Diagnosis not present

## 2013-07-21 DIAGNOSIS — R05 Cough: Secondary | ICD-10-CM | POA: Diagnosis not present

## 2013-07-21 DIAGNOSIS — R5381 Other malaise: Secondary | ICD-10-CM | POA: Diagnosis not present

## 2013-07-21 DIAGNOSIS — R0602 Shortness of breath: Secondary | ICD-10-CM | POA: Diagnosis not present

## 2013-07-21 DIAGNOSIS — R070 Pain in throat: Secondary | ICD-10-CM | POA: Diagnosis not present

## 2013-07-21 DIAGNOSIS — R059 Cough, unspecified: Secondary | ICD-10-CM | POA: Diagnosis not present

## 2013-07-24 DIAGNOSIS — R059 Cough, unspecified: Secondary | ICD-10-CM | POA: Diagnosis not present

## 2013-07-24 DIAGNOSIS — J209 Acute bronchitis, unspecified: Secondary | ICD-10-CM | POA: Diagnosis not present

## 2013-07-24 DIAGNOSIS — R05 Cough: Secondary | ICD-10-CM | POA: Diagnosis not present

## 2013-07-24 DIAGNOSIS — J45909 Unspecified asthma, uncomplicated: Secondary | ICD-10-CM | POA: Diagnosis not present

## 2013-09-10 ENCOUNTER — Other Ambulatory Visit (INDEPENDENT_AMBULATORY_CARE_PROVIDER_SITE_OTHER): Payer: Medicare Other

## 2013-09-10 ENCOUNTER — Ambulatory Visit (INDEPENDENT_AMBULATORY_CARE_PROVIDER_SITE_OTHER): Payer: Medicare Other | Admitting: Internal Medicine

## 2013-09-10 ENCOUNTER — Encounter: Payer: Self-pay | Admitting: Internal Medicine

## 2013-09-10 VITALS — BP 150/70 | HR 76 | Ht 68.0 in | Wt 221.0 lb

## 2013-09-10 DIAGNOSIS — K52839 Microscopic colitis, unspecified: Secondary | ICD-10-CM

## 2013-09-10 DIAGNOSIS — K5289 Other specified noninfective gastroenteritis and colitis: Secondary | ICD-10-CM

## 2013-09-10 DIAGNOSIS — M48061 Spinal stenosis, lumbar region without neurogenic claudication: Secondary | ICD-10-CM | POA: Diagnosis not present

## 2013-09-10 DIAGNOSIS — M25569 Pain in unspecified knee: Secondary | ICD-10-CM | POA: Diagnosis not present

## 2013-09-10 DIAGNOSIS — M5137 Other intervertebral disc degeneration, lumbosacral region: Secondary | ICD-10-CM | POA: Diagnosis not present

## 2013-09-10 LAB — IGA: IgA: 428 mg/dL — ABNORMAL HIGH (ref 68–378)

## 2013-09-10 MED ORDER — DIPHENOXYLATE-ATROPINE 2.5-0.025 MG PO TABS: 1.0000 | ORAL_TABLET | Freq: Four times a day (QID) | ORAL | Status: DC | PRN

## 2013-09-10 NOTE — Progress Notes (Signed)
    Subjective:    Patient ID: [name], male    DOB: June 07, 1941, 73 y.o.   MRN: [id number]  HPI Patient is here for followup of diarrhea and a history of a microscopic colitis. In 2008 the colon looks normal, biopsy suggested a microscopic colitis, in 2012 a repeat colonoscopy raise some question of a patchy colitis consistent with Crohn's disease. I had seen him at that time and expected him to call me back with results of a trial of Xifaxan but he never did. He says now for years and 7 urgent loose bowel movements a day. It sounds like he may be finishing up with a long or protracted divorce. He has a new girlfriend he is happy with, she says she is a grade cook and she insists that he get his diarrhea problem fixed. No fever no significant abdominal pain. He has gained weight. Wt Readings from Last 3 Encounters:  09/10/13 221 lb (100.245 kg)  07/09/13 200 lb 12.8 oz (91.082 kg)  05/05/13 205 lb (92.987 kg)     Review of Systems As above, he has had anxiety over his divorce problems.    Objective:   Physical Exam General:  NAD Eyes:   anicteric Abdomen:  Obese, soft and nontender, BS+      Assessment & Plan:   1. Microscopic colitis    [id number] Nathaneil Canary, MD

## 2013-09-10 NOTE — Patient Instructions (Addendum)
Your physician has requested that you go to the basement for lab work before leaving today. IgA, ttg,IBD serologies  We have given you a printed rx to take to your pharmacy: Lomotil  Please follow up with Dr Carlean Purl in 2 months.  I appreciate the opportunity to care for you.

## 2013-09-10 NOTE — Assessment & Plan Note (Addendum)
?   IBD vsIBS TTG Ab and IBD serology Lomotil when necessary. I was going to try a bile acid sequestrant but none of those appear to be on his formulary. I'm not sure Lomotil as either. I will contact him with the results of the labs above.

## 2013-09-18 LAB — IBD EXPANDED PANEL
ACCA: 30 units (ref 0–90)
ALCA: 17 units (ref 0–60)
AMCA: 89 units (ref 0–100)
Atypical pANCA: NEGATIVE
gASCA: 20 units (ref 0–50)

## 2013-09-18 LAB — T-TRANSGLUTAMINASE (TTG) IGG: Tissue Transglut Ab: <2 U/mL (ref 0–5)

## 2013-09-18 NOTE — Progress Notes (Signed)
Quick Note:  OK I suggest he stick with that and let me know when he needs a refill ______

## 2013-09-18 NOTE — Progress Notes (Signed)
Quick Note:  Lab tests negative for ulcerative colitis or Crohn's How is he on Lomotil? (# of stools/day and consistency) ______

## 2013-09-19 DIAGNOSIS — IMO0002 Reserved for concepts with insufficient information to code with codable children: Secondary | ICD-10-CM | POA: Diagnosis not present

## 2013-09-19 DIAGNOSIS — M545 Low back pain, unspecified: Secondary | ICD-10-CM | POA: Diagnosis not present

## 2013-09-19 DIAGNOSIS — M5137 Other intervertebral disc degeneration, lumbosacral region: Secondary | ICD-10-CM | POA: Diagnosis not present

## 2013-09-19 DIAGNOSIS — M999 Biomechanical lesion, unspecified: Secondary | ICD-10-CM | POA: Diagnosis not present

## 2013-09-22 DIAGNOSIS — M545 Low back pain, unspecified: Secondary | ICD-10-CM | POA: Diagnosis not present

## 2013-09-22 DIAGNOSIS — M5137 Other intervertebral disc degeneration, lumbosacral region: Secondary | ICD-10-CM | POA: Diagnosis not present

## 2013-09-22 DIAGNOSIS — M999 Biomechanical lesion, unspecified: Secondary | ICD-10-CM | POA: Diagnosis not present

## 2013-09-22 DIAGNOSIS — IMO0002 Reserved for concepts with insufficient information to code with codable children: Secondary | ICD-10-CM | POA: Diagnosis not present

## 2013-09-24 DIAGNOSIS — R0609 Other forms of dyspnea: Secondary | ICD-10-CM | POA: Diagnosis not present

## 2013-09-24 DIAGNOSIS — IMO0002 Reserved for concepts with insufficient information to code with codable children: Secondary | ICD-10-CM | POA: Diagnosis not present

## 2013-09-24 DIAGNOSIS — E119 Type 2 diabetes mellitus without complications: Secondary | ICD-10-CM | POA: Diagnosis not present

## 2013-09-24 DIAGNOSIS — M5137 Other intervertebral disc degeneration, lumbosacral region: Secondary | ICD-10-CM | POA: Diagnosis not present

## 2013-09-24 DIAGNOSIS — M999 Biomechanical lesion, unspecified: Secondary | ICD-10-CM | POA: Diagnosis not present

## 2013-09-24 DIAGNOSIS — E789 Disorder of lipoprotein metabolism, unspecified: Secondary | ICD-10-CM | POA: Diagnosis not present

## 2013-09-24 DIAGNOSIS — M545 Low back pain, unspecified: Secondary | ICD-10-CM | POA: Diagnosis not present

## 2013-09-24 DIAGNOSIS — I10 Essential (primary) hypertension: Secondary | ICD-10-CM | POA: Diagnosis not present

## 2013-09-24 DIAGNOSIS — R0989 Other specified symptoms and signs involving the circulatory and respiratory systems: Secondary | ICD-10-CM | POA: Diagnosis not present

## 2013-09-26 DIAGNOSIS — M545 Low back pain, unspecified: Secondary | ICD-10-CM | POA: Diagnosis not present

## 2013-09-26 DIAGNOSIS — M5137 Other intervertebral disc degeneration, lumbosacral region: Secondary | ICD-10-CM | POA: Diagnosis not present

## 2013-09-26 DIAGNOSIS — M999 Biomechanical lesion, unspecified: Secondary | ICD-10-CM | POA: Diagnosis not present

## 2013-09-26 DIAGNOSIS — IMO0002 Reserved for concepts with insufficient information to code with codable children: Secondary | ICD-10-CM | POA: Diagnosis not present

## 2013-09-29 DIAGNOSIS — M545 Low back pain, unspecified: Secondary | ICD-10-CM | POA: Diagnosis not present

## 2013-09-29 DIAGNOSIS — M999 Biomechanical lesion, unspecified: Secondary | ICD-10-CM | POA: Diagnosis not present

## 2013-09-29 DIAGNOSIS — IMO0002 Reserved for concepts with insufficient information to code with codable children: Secondary | ICD-10-CM | POA: Diagnosis not present

## 2013-09-29 DIAGNOSIS — M5137 Other intervertebral disc degeneration, lumbosacral region: Secondary | ICD-10-CM | POA: Diagnosis not present

## 2013-10-01 DIAGNOSIS — IMO0002 Reserved for concepts with insufficient information to code with codable children: Secondary | ICD-10-CM | POA: Diagnosis not present

## 2013-10-01 DIAGNOSIS — M999 Biomechanical lesion, unspecified: Secondary | ICD-10-CM | POA: Diagnosis not present

## 2013-10-01 DIAGNOSIS — M545 Low back pain, unspecified: Secondary | ICD-10-CM | POA: Diagnosis not present

## 2013-10-01 DIAGNOSIS — M5137 Other intervertebral disc degeneration, lumbosacral region: Secondary | ICD-10-CM | POA: Diagnosis not present

## 2013-10-03 DIAGNOSIS — M545 Low back pain, unspecified: Secondary | ICD-10-CM | POA: Diagnosis not present

## 2013-10-03 DIAGNOSIS — M999 Biomechanical lesion, unspecified: Secondary | ICD-10-CM | POA: Diagnosis not present

## 2013-10-03 DIAGNOSIS — M5137 Other intervertebral disc degeneration, lumbosacral region: Secondary | ICD-10-CM | POA: Diagnosis not present

## 2013-10-03 DIAGNOSIS — IMO0002 Reserved for concepts with insufficient information to code with codable children: Secondary | ICD-10-CM | POA: Diagnosis not present

## 2013-10-06 DIAGNOSIS — IMO0002 Reserved for concepts with insufficient information to code with codable children: Secondary | ICD-10-CM | POA: Diagnosis not present

## 2013-10-06 DIAGNOSIS — M999 Biomechanical lesion, unspecified: Secondary | ICD-10-CM | POA: Diagnosis not present

## 2013-10-06 DIAGNOSIS — M5137 Other intervertebral disc degeneration, lumbosacral region: Secondary | ICD-10-CM | POA: Diagnosis not present

## 2013-10-06 DIAGNOSIS — M545 Low back pain, unspecified: Secondary | ICD-10-CM | POA: Diagnosis not present

## 2013-10-08 DIAGNOSIS — M545 Low back pain, unspecified: Secondary | ICD-10-CM | POA: Diagnosis not present

## 2013-10-08 DIAGNOSIS — M999 Biomechanical lesion, unspecified: Secondary | ICD-10-CM | POA: Diagnosis not present

## 2013-10-08 DIAGNOSIS — IMO0002 Reserved for concepts with insufficient information to code with codable children: Secondary | ICD-10-CM | POA: Diagnosis not present

## 2013-10-08 DIAGNOSIS — M5137 Other intervertebral disc degeneration, lumbosacral region: Secondary | ICD-10-CM | POA: Diagnosis not present

## 2013-10-10 DIAGNOSIS — M545 Low back pain, unspecified: Secondary | ICD-10-CM | POA: Diagnosis not present

## 2013-10-10 DIAGNOSIS — M5137 Other intervertebral disc degeneration, lumbosacral region: Secondary | ICD-10-CM | POA: Diagnosis not present

## 2013-10-10 DIAGNOSIS — IMO0002 Reserved for concepts with insufficient information to code with codable children: Secondary | ICD-10-CM | POA: Diagnosis not present

## 2013-10-10 DIAGNOSIS — M999 Biomechanical lesion, unspecified: Secondary | ICD-10-CM | POA: Diagnosis not present

## 2013-10-13 DIAGNOSIS — M545 Low back pain, unspecified: Secondary | ICD-10-CM | POA: Diagnosis not present

## 2013-10-13 DIAGNOSIS — M999 Biomechanical lesion, unspecified: Secondary | ICD-10-CM | POA: Diagnosis not present

## 2013-10-13 DIAGNOSIS — M5137 Other intervertebral disc degeneration, lumbosacral region: Secondary | ICD-10-CM | POA: Diagnosis not present

## 2013-10-13 DIAGNOSIS — IMO0002 Reserved for concepts with insufficient information to code with codable children: Secondary | ICD-10-CM | POA: Diagnosis not present

## 2013-10-15 DIAGNOSIS — IMO0002 Reserved for concepts with insufficient information to code with codable children: Secondary | ICD-10-CM | POA: Diagnosis not present

## 2013-10-15 DIAGNOSIS — M545 Low back pain, unspecified: Secondary | ICD-10-CM | POA: Diagnosis not present

## 2013-10-15 DIAGNOSIS — M5137 Other intervertebral disc degeneration, lumbosacral region: Secondary | ICD-10-CM | POA: Diagnosis not present

## 2013-10-15 DIAGNOSIS — M999 Biomechanical lesion, unspecified: Secondary | ICD-10-CM | POA: Diagnosis not present

## 2013-10-17 DIAGNOSIS — M5137 Other intervertebral disc degeneration, lumbosacral region: Secondary | ICD-10-CM | POA: Diagnosis not present

## 2013-10-17 DIAGNOSIS — M545 Low back pain, unspecified: Secondary | ICD-10-CM | POA: Diagnosis not present

## 2013-10-17 DIAGNOSIS — M999 Biomechanical lesion, unspecified: Secondary | ICD-10-CM | POA: Diagnosis not present

## 2013-10-17 DIAGNOSIS — IMO0002 Reserved for concepts with insufficient information to code with codable children: Secondary | ICD-10-CM | POA: Diagnosis not present

## 2013-10-20 DIAGNOSIS — M999 Biomechanical lesion, unspecified: Secondary | ICD-10-CM | POA: Diagnosis not present

## 2013-10-20 DIAGNOSIS — M545 Low back pain, unspecified: Secondary | ICD-10-CM | POA: Diagnosis not present

## 2013-10-20 DIAGNOSIS — IMO0002 Reserved for concepts with insufficient information to code with codable children: Secondary | ICD-10-CM | POA: Diagnosis not present

## 2013-10-20 DIAGNOSIS — M5137 Other intervertebral disc degeneration, lumbosacral region: Secondary | ICD-10-CM | POA: Diagnosis not present

## 2013-10-22 DIAGNOSIS — M999 Biomechanical lesion, unspecified: Secondary | ICD-10-CM | POA: Diagnosis not present

## 2013-10-22 DIAGNOSIS — M545 Low back pain, unspecified: Secondary | ICD-10-CM | POA: Diagnosis not present

## 2013-10-22 DIAGNOSIS — M5137 Other intervertebral disc degeneration, lumbosacral region: Secondary | ICD-10-CM | POA: Diagnosis not present

## 2013-10-22 DIAGNOSIS — IMO0002 Reserved for concepts with insufficient information to code with codable children: Secondary | ICD-10-CM | POA: Diagnosis not present

## 2013-10-24 DIAGNOSIS — M999 Biomechanical lesion, unspecified: Secondary | ICD-10-CM | POA: Diagnosis not present

## 2013-10-24 DIAGNOSIS — M545 Low back pain, unspecified: Secondary | ICD-10-CM | POA: Diagnosis not present

## 2013-10-24 DIAGNOSIS — IMO0002 Reserved for concepts with insufficient information to code with codable children: Secondary | ICD-10-CM | POA: Diagnosis not present

## 2013-10-24 DIAGNOSIS — M5137 Other intervertebral disc degeneration, lumbosacral region: Secondary | ICD-10-CM | POA: Diagnosis not present

## 2013-11-04 DIAGNOSIS — Z85828 Personal history of other malignant neoplasm of skin: Secondary | ICD-10-CM | POA: Diagnosis not present

## 2013-11-04 DIAGNOSIS — L570 Actinic keratosis: Secondary | ICD-10-CM | POA: Diagnosis not present

## 2013-11-11 DIAGNOSIS — R197 Diarrhea, unspecified: Secondary | ICD-10-CM | POA: Diagnosis not present

## 2013-11-11 DIAGNOSIS — K219 Gastro-esophageal reflux disease without esophagitis: Secondary | ICD-10-CM | POA: Diagnosis not present

## 2013-11-15 DIAGNOSIS — R197 Diarrhea, unspecified: Secondary | ICD-10-CM | POA: Diagnosis not present

## 2013-12-03 DIAGNOSIS — N189 Chronic kidney disease, unspecified: Secondary | ICD-10-CM | POA: Diagnosis not present

## 2013-12-03 DIAGNOSIS — K2270 Barrett's esophagus without dysplasia: Secondary | ICD-10-CM | POA: Diagnosis not present

## 2013-12-03 DIAGNOSIS — G4730 Sleep apnea, unspecified: Secondary | ICD-10-CM | POA: Diagnosis not present

## 2013-12-03 DIAGNOSIS — I129 Hypertensive chronic kidney disease with stage 1 through stage 4 chronic kidney disease, or unspecified chronic kidney disease: Secondary | ICD-10-CM | POA: Diagnosis not present

## 2013-12-03 DIAGNOSIS — R197 Diarrhea, unspecified: Secondary | ICD-10-CM | POA: Diagnosis not present

## 2013-12-03 DIAGNOSIS — R1013 Epigastric pain: Secondary | ICD-10-CM | POA: Diagnosis not present

## 2013-12-03 DIAGNOSIS — Z9989 Dependence on other enabling machines and devices: Secondary | ICD-10-CM | POA: Diagnosis not present

## 2013-12-03 DIAGNOSIS — E039 Hypothyroidism, unspecified: Secondary | ICD-10-CM | POA: Diagnosis not present

## 2013-12-03 DIAGNOSIS — N400 Enlarged prostate without lower urinary tract symptoms: Secondary | ICD-10-CM | POA: Diagnosis not present

## 2013-12-03 DIAGNOSIS — K2990 Gastroduodenitis, unspecified, without bleeding: Secondary | ICD-10-CM | POA: Diagnosis not present

## 2013-12-03 DIAGNOSIS — E119 Type 2 diabetes mellitus without complications: Secondary | ICD-10-CM | POA: Diagnosis not present

## 2013-12-03 DIAGNOSIS — K219 Gastro-esophageal reflux disease without esophagitis: Secondary | ICD-10-CM | POA: Diagnosis not present

## 2013-12-03 DIAGNOSIS — F341 Dysthymic disorder: Secondary | ICD-10-CM | POA: Diagnosis not present

## 2013-12-03 DIAGNOSIS — Z79899 Other long term (current) drug therapy: Secondary | ICD-10-CM | POA: Diagnosis not present

## 2013-12-03 DIAGNOSIS — M1990 Unspecified osteoarthritis, unspecified site: Secondary | ICD-10-CM | POA: Diagnosis not present

## 2013-12-03 DIAGNOSIS — J45909 Unspecified asthma, uncomplicated: Secondary | ICD-10-CM | POA: Diagnosis not present

## 2013-12-03 DIAGNOSIS — E785 Hyperlipidemia, unspecified: Secondary | ICD-10-CM | POA: Diagnosis not present

## 2013-12-03 DIAGNOSIS — K449 Diaphragmatic hernia without obstruction or gangrene: Secondary | ICD-10-CM | POA: Diagnosis not present

## 2013-12-03 DIAGNOSIS — K2970 Gastritis, unspecified, without bleeding: Secondary | ICD-10-CM | POA: Diagnosis not present

## 2013-12-03 DIAGNOSIS — Z7982 Long term (current) use of aspirin: Secondary | ICD-10-CM | POA: Diagnosis not present

## 2014-01-01 DIAGNOSIS — N400 Enlarged prostate without lower urinary tract symptoms: Secondary | ICD-10-CM | POA: Diagnosis not present

## 2014-01-01 DIAGNOSIS — K591 Functional diarrhea: Secondary | ICD-10-CM | POA: Diagnosis not present

## 2014-01-01 DIAGNOSIS — Z79899 Other long term (current) drug therapy: Secondary | ICD-10-CM | POA: Diagnosis not present

## 2014-01-01 DIAGNOSIS — F341 Dysthymic disorder: Secondary | ICD-10-CM | POA: Diagnosis not present

## 2014-01-01 DIAGNOSIS — K219 Gastro-esophageal reflux disease without esophagitis: Secondary | ICD-10-CM | POA: Diagnosis not present

## 2014-01-01 DIAGNOSIS — J45909 Unspecified asthma, uncomplicated: Secondary | ICD-10-CM | POA: Diagnosis not present

## 2014-01-01 DIAGNOSIS — G4730 Sleep apnea, unspecified: Secondary | ICD-10-CM | POA: Diagnosis not present

## 2014-01-01 DIAGNOSIS — K55059 Acute (reversible) ischemia of intestine, part and extent unspecified: Secondary | ICD-10-CM | POA: Diagnosis not present

## 2014-01-01 DIAGNOSIS — R197 Diarrhea, unspecified: Secondary | ICD-10-CM | POA: Diagnosis not present

## 2014-01-01 DIAGNOSIS — E785 Hyperlipidemia, unspecified: Secondary | ICD-10-CM | POA: Diagnosis not present

## 2014-01-01 DIAGNOSIS — E119 Type 2 diabetes mellitus without complications: Secondary | ICD-10-CM | POA: Diagnosis not present

## 2014-01-01 DIAGNOSIS — K648 Other hemorrhoids: Secondary | ICD-10-CM | POA: Diagnosis not present

## 2014-01-01 DIAGNOSIS — Z7982 Long term (current) use of aspirin: Secondary | ICD-10-CM | POA: Diagnosis not present

## 2014-01-01 DIAGNOSIS — E039 Hypothyroidism, unspecified: Secondary | ICD-10-CM | POA: Diagnosis not present

## 2014-01-01 DIAGNOSIS — M1990 Unspecified osteoarthritis, unspecified site: Secondary | ICD-10-CM | POA: Diagnosis not present

## 2014-01-01 DIAGNOSIS — N189 Chronic kidney disease, unspecified: Secondary | ICD-10-CM | POA: Diagnosis not present

## 2014-01-01 DIAGNOSIS — K5289 Other specified noninfective gastroenteritis and colitis: Secondary | ICD-10-CM | POA: Diagnosis not present

## 2014-01-01 DIAGNOSIS — I129 Hypertensive chronic kidney disease with stage 1 through stage 4 chronic kidney disease, or unspecified chronic kidney disease: Secondary | ICD-10-CM | POA: Diagnosis not present

## 2014-01-01 DIAGNOSIS — K5730 Diverticulosis of large intestine without perforation or abscess without bleeding: Secondary | ICD-10-CM | POA: Diagnosis not present

## 2014-01-01 HISTORY — PX: COLONOSCOPY: SHX174

## 2014-01-02 DIAGNOSIS — M25559 Pain in unspecified hip: Secondary | ICD-10-CM | POA: Diagnosis not present

## 2014-01-02 DIAGNOSIS — M5430 Sciatica, unspecified side: Secondary | ICD-10-CM | POA: Diagnosis not present

## 2014-01-03 DIAGNOSIS — M25559 Pain in unspecified hip: Secondary | ICD-10-CM | POA: Diagnosis not present

## 2014-01-06 ENCOUNTER — Ambulatory Visit (INDEPENDENT_AMBULATORY_CARE_PROVIDER_SITE_OTHER): Payer: Medicare Other | Admitting: Adult Health

## 2014-01-06 ENCOUNTER — Encounter (INDEPENDENT_AMBULATORY_CARE_PROVIDER_SITE_OTHER): Payer: Self-pay

## 2014-01-06 ENCOUNTER — Encounter: Payer: Self-pay | Admitting: Adult Health

## 2014-01-06 VITALS — BP 122/74 | HR 64 | Temp 97.7°F | Ht 68.25 in | Wt 212.5 lb

## 2014-01-06 DIAGNOSIS — G4730 Sleep apnea, unspecified: Secondary | ICD-10-CM

## 2014-01-06 DIAGNOSIS — J45909 Unspecified asthma, uncomplicated: Secondary | ICD-10-CM

## 2014-01-06 NOTE — Assessment & Plan Note (Addendum)
Well controlled on rx  Will decrease Advair 250 Immunizatioin UTD   Plan Decrease Advair 250/50 1 puff Twice daily  , rinse after use.   follow up Dr. Elsworth Soho  In 6 months and As needed

## 2014-01-06 NOTE — Patient Instructions (Signed)
Decrease Advair 250/50 1 puff Twice daily  , rinse after use.  Continue on CPAP At bedtime   follow up Dr. Elsworth Soho  In 6 months and As needed

## 2014-01-06 NOTE — Assessment & Plan Note (Signed)
Continue on CPAP At bedtime   Wt loss

## 2014-01-06 NOTE — Progress Notes (Signed)
   Subjective:    Patient ID: [name], male    DOB: 19-Mar-1941, 73 y.o.   MRN: [id number]  HPI   PCP - Lang Snow   81 -year-old never smoker referred for FU of dyspnea and cough with initial pulmonary consult 02/27/13   02/27/13 IOV  Pt reports had URI 3 weeks ago and took about 2 weeks to get better. He received Levaquin and a prednisone taper.  His peak flow was in the 450 range. pt stated he is still SOB, cough w/ occasional yellow phlem, but denies wheezing.  Dyspnea is worse with exertion or climbing stairs and with heat and humidity, improves with using Combivent  he reports asthma sinceH. 10 worsened by weather changes and exercise. He requires to 3 prednisone tapers per year, his last hospitalization was in 2006 for pneumonia complicated by Clostridium difficile infection. He is maintained on Advair and Combivent. He takes Advair only once at bedtime.  He has been maintained on altace for at least 6 years.  He reports a normal chest x-ray with Dr. Brigitte Pulse . He reports a history of obstructive sleep apnea status post UPPP in 1997 and is compliant with his CPAP.  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  >>Advair increased Twice daily    07/09/13  Altace stopped last visit Had food poisoning with renal failure & diovan was stopped BP high today - has FU OV with dr Brigitte Pulse on 12/2 PSG (205 lbs) showed severe OSA, predom hypopneas -AHI 31/h -needs CPAP titration study in the lab since central apneas noted CPAP titration reviewed, needed 15 cm to correct, but had few arousals at this pressure. Hence bilevel tried  Suggest auto CPAP 10 - 15 cm, med FF mask, humidity, download showed >>good usage 5.5 h, avg pr 10 cm, mod leak Spirometry (Off altace) nml ratio, FEv1 73% >>no changes   01/06/2014 Follow up OSA and  Asthma  Returns for  6 month Asthma follow up Reports breathing is doing well overall. No flare of cough or wheezing.  Decreased SABA use. No ER/Hosp visits.   Wearing CPAP At bedtime  . No excessive daytime sleepiness.  Wears at least 6 hr each night . Says he doing well , currently engaged.    Review of Systems  neg for any significant sore throat, dysphagia, itching, sneezing, nasal congestion or excess/ purulent secretions, fever, chills, sweats, unintended wt loss, pleuritic or exertional cp, hempoptysis, orthopnea pnd or change in chronic leg swelling. Also denies presyncope, palpitations, heartburn, abdominal pain, nausea, vomiting, diarrhea or change in bowel or urinary habits, dysuria,hematuria, rash, arthralgias, visual complaints, headache, numbness weakness or ataxia.     Objective:   Physical Exam   Gen. Pleasant, well-nourished, in no distress ENT - no lesions, no post nasal drip Neck: No JVD, no thyromegaly, no carotid bruits Lungs: no use of accessory muscles, no dullness to percussion, clear without rales or rhonchi  Cardiovascular: Rhythm regular, heart sounds  normal, no murmurs or gallops, no peripheral edema Musculoskeletal: No deformities, no cyanosis or clubbing        Assessment & Plan:

## 2014-01-12 NOTE — Progress Notes (Signed)
Reviewed & agree with plan  

## 2014-02-17 DIAGNOSIS — H35369 Drusen (degenerative) of macula, unspecified eye: Secondary | ICD-10-CM | POA: Diagnosis not present

## 2014-02-17 DIAGNOSIS — H33019 Retinal detachment with single break, unspecified eye: Secondary | ICD-10-CM | POA: Diagnosis not present

## 2014-02-17 DIAGNOSIS — E119 Type 2 diabetes mellitus without complications: Secondary | ICD-10-CM | POA: Diagnosis not present

## 2014-02-18 DIAGNOSIS — H33019 Retinal detachment with single break, unspecified eye: Secondary | ICD-10-CM | POA: Diagnosis not present

## 2014-02-18 DIAGNOSIS — Z961 Presence of intraocular lens: Secondary | ICD-10-CM | POA: Diagnosis not present

## 2014-02-18 DIAGNOSIS — H33009 Unspecified retinal detachment with retinal break, unspecified eye: Secondary | ICD-10-CM | POA: Diagnosis not present

## 2014-03-19 DIAGNOSIS — H3340 Traction detachment of retina, unspecified eye: Secondary | ICD-10-CM | POA: Diagnosis not present

## 2014-03-24 DIAGNOSIS — R0989 Other specified symptoms and signs involving the circulatory and respiratory systems: Secondary | ICD-10-CM | POA: Diagnosis not present

## 2014-03-24 DIAGNOSIS — R0609 Other forms of dyspnea: Secondary | ICD-10-CM | POA: Diagnosis not present

## 2014-03-24 DIAGNOSIS — E119 Type 2 diabetes mellitus without complications: Secondary | ICD-10-CM | POA: Diagnosis not present

## 2014-03-24 DIAGNOSIS — I10 Essential (primary) hypertension: Secondary | ICD-10-CM | POA: Diagnosis not present

## 2014-03-25 DIAGNOSIS — H3340 Traction detachment of retina, unspecified eye: Secondary | ICD-10-CM | POA: Diagnosis not present

## 2014-03-25 DIAGNOSIS — H5460 Unqualified visual loss, one eye, unspecified: Secondary | ICD-10-CM | POA: Diagnosis not present

## 2014-03-25 DIAGNOSIS — H3520 Other non-diabetic proliferative retinopathy, unspecified eye: Secondary | ICD-10-CM | POA: Diagnosis not present

## 2014-03-25 DIAGNOSIS — Z9889 Other specified postprocedural states: Secondary | ICD-10-CM | POA: Diagnosis not present

## 2014-04-16 DIAGNOSIS — K591 Functional diarrhea: Secondary | ICD-10-CM | POA: Diagnosis not present

## 2014-04-16 DIAGNOSIS — K2270 Barrett's esophagus without dysplasia: Secondary | ICD-10-CM | POA: Diagnosis not present

## 2014-05-13 DIAGNOSIS — R109 Unspecified abdominal pain: Secondary | ICD-10-CM | POA: Diagnosis not present

## 2014-05-13 DIAGNOSIS — K5289 Other specified noninfective gastroenteritis and colitis: Secondary | ICD-10-CM | POA: Diagnosis not present

## 2014-05-13 DIAGNOSIS — Q619 Cystic kidney disease, unspecified: Secondary | ICD-10-CM | POA: Diagnosis not present

## 2014-05-13 DIAGNOSIS — R197 Diarrhea, unspecified: Secondary | ICD-10-CM | POA: Diagnosis not present

## 2014-05-25 DIAGNOSIS — Z23 Encounter for immunization: Secondary | ICD-10-CM | POA: Diagnosis not present

## 2014-06-17 DIAGNOSIS — K521 Toxic gastroenteritis and colitis: Secondary | ICD-10-CM | POA: Diagnosis not present

## 2014-06-19 DIAGNOSIS — H6123 Impacted cerumen, bilateral: Secondary | ICD-10-CM | POA: Diagnosis not present

## 2014-06-30 DIAGNOSIS — H3342 Traction detachment of retina, left eye: Secondary | ICD-10-CM | POA: Diagnosis not present

## 2014-06-30 DIAGNOSIS — H33012 Retinal detachment with single break, left eye: Secondary | ICD-10-CM | POA: Diagnosis not present

## 2014-07-07 DIAGNOSIS — L821 Other seborrheic keratosis: Secondary | ICD-10-CM | POA: Diagnosis not present

## 2014-07-07 DIAGNOSIS — Z85828 Personal history of other malignant neoplasm of skin: Secondary | ICD-10-CM | POA: Diagnosis not present

## 2014-07-07 DIAGNOSIS — L570 Actinic keratosis: Secondary | ICD-10-CM | POA: Diagnosis not present

## 2014-07-07 DIAGNOSIS — X32XXXA Exposure to sunlight, initial encounter: Secondary | ICD-10-CM | POA: Diagnosis not present

## 2014-07-08 DIAGNOSIS — Z6831 Body mass index (BMI) 31.0-31.9, adult: Secondary | ICD-10-CM | POA: Diagnosis not present

## 2014-07-08 DIAGNOSIS — J45909 Unspecified asthma, uncomplicated: Secondary | ICD-10-CM | POA: Diagnosis not present

## 2014-07-08 DIAGNOSIS — E1129 Type 2 diabetes mellitus with other diabetic kidney complication: Secondary | ICD-10-CM | POA: Diagnosis not present

## 2014-07-08 DIAGNOSIS — I10 Essential (primary) hypertension: Secondary | ICD-10-CM | POA: Diagnosis not present

## 2014-07-08 DIAGNOSIS — Z0000 Encounter for general adult medical examination without abnormal findings: Secondary | ICD-10-CM | POA: Diagnosis not present

## 2014-07-08 DIAGNOSIS — F329 Major depressive disorder, single episode, unspecified: Secondary | ICD-10-CM | POA: Diagnosis not present

## 2014-07-08 DIAGNOSIS — Z021 Encounter for pre-employment examination: Secondary | ICD-10-CM | POA: Diagnosis not present

## 2014-07-08 DIAGNOSIS — E785 Hyperlipidemia, unspecified: Secondary | ICD-10-CM | POA: Diagnosis not present

## 2014-07-08 DIAGNOSIS — N183 Chronic kidney disease, stage 3 (moderate): Secondary | ICD-10-CM | POA: Diagnosis not present

## 2014-07-21 DIAGNOSIS — L570 Actinic keratosis: Secondary | ICD-10-CM | POA: Diagnosis not present

## 2014-07-29 ENCOUNTER — Telehealth: Payer: Self-pay | Admitting: Pulmonary Disease

## 2014-07-29 ENCOUNTER — Ambulatory Visit (INDEPENDENT_AMBULATORY_CARE_PROVIDER_SITE_OTHER): Payer: Medicare Other | Admitting: Pulmonary Disease

## 2014-07-29 ENCOUNTER — Encounter: Payer: Self-pay | Admitting: Pulmonary Disease

## 2014-07-29 VITALS — BP 100/58 | HR 101 | Temp 98.0°F | Ht 68.75 in | Wt 212.0 lb

## 2014-07-29 DIAGNOSIS — R259 Unspecified abnormal involuntary movements: Secondary | ICD-10-CM

## 2014-07-29 DIAGNOSIS — G4730 Sleep apnea, unspecified: Secondary | ICD-10-CM

## 2014-07-29 DIAGNOSIS — J4530 Mild persistent asthma, uncomplicated: Secondary | ICD-10-CM

## 2014-07-29 NOTE — Assessment & Plan Note (Signed)
Continue Symbicort 2 puffs at bedtime Use albuterol as needed only He will call for Z-Pak of sputum changes color

## 2014-07-29 NOTE — Assessment & Plan Note (Signed)
Continue auto C Pap-average pressure of 10 cm Weight loss encouraged, compliance with goal of at least 4-6 hrs every night is the expectation. Advised against medications with sedative side effects Cautioned against driving when sleepy - understanding that sleepiness will vary on a day to day basis

## 2014-07-29 NOTE — Patient Instructions (Signed)
OK to use symbicort twice daily Call for antibiotic if sputum changes color geta ppt with neurology for stiffness & speech changes

## 2014-07-29 NOTE — Telephone Encounter (Signed)
lmomtcb x1 

## 2014-07-29 NOTE — Progress Notes (Signed)
   Subjective:    Patient ID: [name], male    DOB: 1940/10/22, 73 y.o.   MRN: [id number]  HPI  PCP - Lang Snow   60 -year-old never smoker for follow-up of OSA and asthma initial pulmonary consult 02/27/13   he reports asthma since age 45 worsened by weather changes and exercise. He requires 2-3 prednisone tapers per year, his last hospitalization was in 2006 for pneumonia complicated by Clostridium difficile infection. Altace stopped 02/2013   He reports a history of obstructive sleep apnea status post UPPP in 1997 and is compliant with his CPAP.  He retired as a Air cabin crew in 2007   Significant tests/ events  Spirometry 02/2013 surprisingly showed moderate airway obstruction with FEV1 of 54% and FVC of 67% with a ratio 63.  >>Advair increased Twice daily   Spirometry  06/2013 (Off altace) nml ratio, FEv1 73%  PSG 2014 (205 lbs) showed severe OSA, predom hypopneas -AHI 31/h, needed 15 cm to correct, but had few arousals at this pressure. Hence bilevel tried  Placed on auto CPAP 10 - 15 cm, med FF mask, humidity, download showed >>good usage 5.5 h, avg pr 10 cm, mod leak       07/29/2014  Chief Complaint  Patient presents with  . Follow-up    non- productive cough; chest tightness; light wheezing   BP low on valsartan, slight tachy Saw a neurologist 3 y ago- was told he does not have dementia Substitute teaching -starting in [place] Not using advair- takes symbicort from [employer]- gets 'big' discount   Wearing CPAP At bedtime . No excessive daytime sleepiness.  Wears at least 6 hr each night .     Review of Systems neg for any significant sore throat, dysphagia, itching, sneezing, nasal congestion or excess/ purulent secretions, fever, chills, sweats, unintended wt loss, pleuritic or exertional cp, hempoptysis, orthopnea pnd or change in chronic leg swelling. Also denies presyncope, palpitations, heartburn, abdominal pain, nausea, vomiting, diarrhea or  change in bowel or urinary habits, dysuria,hematuria, rash, arthralgias, visual complaints, headache, numbness weakness or ataxia.     Objective:   Physical Exam  Gen. Pleasant, well-nourished, in no distress ENT - no lesions, no post nasal drip Neck: No JVD, no thyromegaly, no carotid bruits Lungs: no use of accessory muscles, no dullness to percussion, clear without rales or rhonchi  Cardiovascular: Rhythm regular, heart sounds  normal, no murmurs or gallops, no peripheral edema Musculoskeletal: No deformities, no cyanosis or clubbing , stiffness in both wrists       Assessment & Plan:

## 2014-07-30 NOTE — Telephone Encounter (Signed)
Pt has decided he wants to go to [facility]. It has been over 3 years since he has been there so will need [place] to call for referral. [facility] # [phone]. Doesn't matter which doctor. Call pt [phone].

## 2014-07-30 NOTE — Telephone Encounter (Signed)
OK for referral , for suspected parkinson's

## 2014-07-30 NOTE — Telephone Encounter (Signed)
Called and spoke to pt. Informed pt of the referral to neurology. Order placed. Pt verbalized understanding and denied any further questions or concerns at this time.

## 2014-07-30 NOTE — Telephone Encounter (Signed)
[phone] calling back

## 2014-07-30 NOTE — Telephone Encounter (Signed)
Spoke with pt, states he wants to see any provider at [facility].  Dr. Elsworth Soho are you ok with this referral?  Thanks!

## 2014-07-30 NOTE — Telephone Encounter (Signed)
Spoke with the pt  He is requesting referral to Dr Metta Clines- Neuro  Please advise if this is okay thanks

## 2014-08-05 DIAGNOSIS — J209 Acute bronchitis, unspecified: Secondary | ICD-10-CM | POA: Diagnosis not present

## 2014-08-05 DIAGNOSIS — J069 Acute upper respiratory infection, unspecified: Secondary | ICD-10-CM | POA: Diagnosis not present

## 2014-08-05 DIAGNOSIS — J45909 Unspecified asthma, uncomplicated: Secondary | ICD-10-CM | POA: Diagnosis not present

## 2014-08-05 DIAGNOSIS — R062 Wheezing: Secondary | ICD-10-CM | POA: Diagnosis not present

## 2014-08-06 ENCOUNTER — Telehealth: Payer: Self-pay | Admitting: Pulmonary Disease

## 2014-08-06 NOTE — Telephone Encounter (Signed)
Download 07/28/14 on auto 10-15 - residuals + AHI 20/h Large leak - can he improve seal ? Needs to improve usage> 4h every night

## 2014-08-10 NOTE — Telephone Encounter (Signed)
Patient notified.  Patient says he is able to improve the seal and he will try to wear over 4 hours nightly.  Nothing further needed.

## 2014-08-11 DIAGNOSIS — E119 Type 2 diabetes mellitus without complications: Secondary | ICD-10-CM | POA: Diagnosis not present

## 2014-08-11 DIAGNOSIS — H33012 Retinal detachment with single break, left eye: Secondary | ICD-10-CM | POA: Diagnosis not present

## 2014-08-11 DIAGNOSIS — H3342 Traction detachment of retina, left eye: Secondary | ICD-10-CM | POA: Diagnosis not present

## 2014-08-12 DIAGNOSIS — H4050X4 Glaucoma secondary to other eye disorders, unspecified eye, indeterminate stage: Secondary | ICD-10-CM | POA: Diagnosis not present

## 2014-08-12 DIAGNOSIS — H4052X0 Glaucoma secondary to other eye disorders, left eye, stage unspecified: Secondary | ICD-10-CM | POA: Diagnosis not present

## 2014-08-12 DIAGNOSIS — H27132 Posterior dislocation of lens, left eye: Secondary | ICD-10-CM | POA: Diagnosis not present

## 2014-08-12 DIAGNOSIS — H33012 Retinal detachment with single break, left eye: Secondary | ICD-10-CM | POA: Diagnosis not present

## 2014-08-12 DIAGNOSIS — H44712 Retained (nonmagnetic) (old) foreign body in anterior chamber, left eye: Secondary | ICD-10-CM | POA: Diagnosis not present

## 2014-08-12 DIAGNOSIS — H44752 Retained (nonmagnetic) (old) foreign body in vitreous body, left eye: Secondary | ICD-10-CM | POA: Diagnosis not present

## 2014-08-19 DIAGNOSIS — H4033X2 Glaucoma secondary to eye trauma, bilateral, moderate stage: Secondary | ICD-10-CM | POA: Diagnosis not present

## 2014-08-28 DIAGNOSIS — L570 Actinic keratosis: Secondary | ICD-10-CM | POA: Diagnosis not present

## 2014-09-17 DIAGNOSIS — E789 Disorder of lipoprotein metabolism, unspecified: Secondary | ICD-10-CM | POA: Diagnosis not present

## 2014-09-17 DIAGNOSIS — E118 Type 2 diabetes mellitus with unspecified complications: Secondary | ICD-10-CM | POA: Diagnosis not present

## 2014-09-24 DIAGNOSIS — R0602 Shortness of breath: Secondary | ICD-10-CM | POA: Diagnosis not present

## 2014-09-24 DIAGNOSIS — E118 Type 2 diabetes mellitus with unspecified complications: Secondary | ICD-10-CM | POA: Diagnosis not present

## 2014-09-24 DIAGNOSIS — R197 Diarrhea, unspecified: Secondary | ICD-10-CM | POA: Diagnosis not present

## 2014-09-24 DIAGNOSIS — I10 Essential (primary) hypertension: Secondary | ICD-10-CM | POA: Diagnosis not present

## 2014-10-03 DIAGNOSIS — M542 Cervicalgia: Secondary | ICD-10-CM | POA: Diagnosis not present

## 2014-10-05 DIAGNOSIS — F331 Major depressive disorder, recurrent, moderate: Secondary | ICD-10-CM | POA: Diagnosis not present

## 2014-10-05 DIAGNOSIS — R413 Other amnesia: Secondary | ICD-10-CM | POA: Diagnosis not present

## 2014-10-06 DIAGNOSIS — R197 Diarrhea, unspecified: Secondary | ICD-10-CM | POA: Diagnosis not present

## 2014-10-07 DIAGNOSIS — H4011X4 Primary open-angle glaucoma, indeterminate stage: Secondary | ICD-10-CM | POA: Diagnosis not present

## 2014-10-12 DIAGNOSIS — N179 Acute kidney failure, unspecified: Secondary | ICD-10-CM | POA: Diagnosis not present

## 2014-10-12 DIAGNOSIS — M5021 Other cervical disc displacement,  high cervical region: Secondary | ICD-10-CM | POA: Diagnosis not present

## 2014-10-12 DIAGNOSIS — R404 Transient alteration of awareness: Secondary | ICD-10-CM | POA: Diagnosis not present

## 2014-10-12 DIAGNOSIS — Z79899 Other long term (current) drug therapy: Secondary | ICD-10-CM | POA: Diagnosis not present

## 2014-10-12 DIAGNOSIS — W010XXA Fall on same level from slipping, tripping and stumbling without subsequent striking against object, initial encounter: Secondary | ICD-10-CM | POA: Diagnosis not present

## 2014-10-12 DIAGNOSIS — G92 Toxic encephalopathy: Secondary | ICD-10-CM | POA: Diagnosis not present

## 2014-10-12 DIAGNOSIS — E119 Type 2 diabetes mellitus without complications: Secondary | ICD-10-CM | POA: Diagnosis not present

## 2014-10-12 DIAGNOSIS — M5031 Other cervical disc degeneration,  high cervical region: Secondary | ICD-10-CM | POA: Diagnosis not present

## 2014-10-12 DIAGNOSIS — D519 Vitamin B12 deficiency anemia, unspecified: Secondary | ICD-10-CM | POA: Diagnosis not present

## 2014-10-12 DIAGNOSIS — M5001 Cervical disc disorder with myelopathy,  high cervical region: Secondary | ICD-10-CM | POA: Diagnosis not present

## 2014-10-12 DIAGNOSIS — F0390 Unspecified dementia without behavioral disturbance: Secondary | ICD-10-CM | POA: Diagnosis not present

## 2014-10-12 DIAGNOSIS — G894 Chronic pain syndrome: Secondary | ICD-10-CM | POA: Diagnosis present

## 2014-10-12 DIAGNOSIS — R42 Dizziness and giddiness: Secondary | ICD-10-CM | POA: Diagnosis not present

## 2014-10-12 DIAGNOSIS — M4802 Spinal stenosis, cervical region: Secondary | ICD-10-CM | POA: Diagnosis not present

## 2014-10-12 DIAGNOSIS — F329 Major depressive disorder, single episode, unspecified: Secondary | ICD-10-CM | POA: Diagnosis present

## 2014-10-12 DIAGNOSIS — G959 Disease of spinal cord, unspecified: Secondary | ICD-10-CM | POA: Diagnosis not present

## 2014-10-12 DIAGNOSIS — I129 Hypertensive chronic kidney disease with stage 1 through stage 4 chronic kidney disease, or unspecified chronic kidney disease: Secondary | ICD-10-CM | POA: Diagnosis present

## 2014-10-12 DIAGNOSIS — G309 Alzheimer's disease, unspecified: Secondary | ICD-10-CM | POA: Diagnosis present

## 2014-10-12 DIAGNOSIS — R51 Headache: Secondary | ICD-10-CM | POA: Diagnosis not present

## 2014-10-12 DIAGNOSIS — K529 Noninfective gastroenteritis and colitis, unspecified: Secondary | ICD-10-CM | POA: Diagnosis not present

## 2014-10-12 DIAGNOSIS — M5126 Other intervertebral disc displacement, lumbar region: Secondary | ICD-10-CM | POA: Diagnosis not present

## 2014-10-12 DIAGNOSIS — F0280 Dementia in other diseases classified elsewhere without behavioral disturbance: Secondary | ICD-10-CM | POA: Diagnosis present

## 2014-10-12 DIAGNOSIS — M5136 Other intervertebral disc degeneration, lumbar region: Secondary | ICD-10-CM | POA: Diagnosis not present

## 2014-10-12 DIAGNOSIS — F419 Anxiety disorder, unspecified: Secondary | ICD-10-CM | POA: Diagnosis present

## 2014-10-12 DIAGNOSIS — Z6832 Body mass index (BMI) 32.0-32.9, adult: Secondary | ICD-10-CM | POA: Diagnosis not present

## 2014-10-12 DIAGNOSIS — R531 Weakness: Secondary | ICD-10-CM | POA: Diagnosis not present

## 2014-10-12 DIAGNOSIS — M47812 Spondylosis without myelopathy or radiculopathy, cervical region: Secondary | ICD-10-CM | POA: Diagnosis not present

## 2014-10-12 DIAGNOSIS — M6281 Muscle weakness (generalized): Secondary | ICD-10-CM | POA: Diagnosis not present

## 2014-10-12 DIAGNOSIS — E860 Dehydration: Secondary | ICD-10-CM | POA: Diagnosis present

## 2014-10-12 DIAGNOSIS — J45909 Unspecified asthma, uncomplicated: Secondary | ICD-10-CM | POA: Diagnosis present

## 2014-10-12 DIAGNOSIS — R269 Unspecified abnormalities of gait and mobility: Secondary | ICD-10-CM | POA: Diagnosis not present

## 2014-10-12 DIAGNOSIS — Z9889 Other specified postprocedural states: Secondary | ICD-10-CM | POA: Diagnosis not present

## 2014-10-12 DIAGNOSIS — G9529 Other cord compression: Secondary | ICD-10-CM | POA: Diagnosis not present

## 2014-10-12 DIAGNOSIS — R05 Cough: Secondary | ICD-10-CM | POA: Diagnosis not present

## 2014-10-12 DIAGNOSIS — R4182 Altered mental status, unspecified: Secondary | ICD-10-CM | POA: Diagnosis not present

## 2014-10-12 DIAGNOSIS — E669 Obesity, unspecified: Secondary | ICD-10-CM | POA: Diagnosis present

## 2014-10-12 DIAGNOSIS — N182 Chronic kidney disease, stage 2 (mild): Secondary | ICD-10-CM | POA: Diagnosis present

## 2014-10-12 DIAGNOSIS — R54 Age-related physical debility: Secondary | ICD-10-CM | POA: Diagnosis not present

## 2014-10-12 DIAGNOSIS — E538 Deficiency of other specified B group vitamins: Secondary | ICD-10-CM | POA: Diagnosis not present

## 2014-10-12 DIAGNOSIS — R296 Repeated falls: Secondary | ICD-10-CM | POA: Diagnosis not present

## 2014-10-12 DIAGNOSIS — R944 Abnormal results of kidney function studies: Secondary | ICD-10-CM | POA: Diagnosis not present

## 2014-10-21 DIAGNOSIS — E119 Type 2 diabetes mellitus without complications: Secondary | ICD-10-CM | POA: Diagnosis not present

## 2014-10-21 DIAGNOSIS — K529 Noninfective gastroenteritis and colitis, unspecified: Secondary | ICD-10-CM | POA: Diagnosis not present

## 2014-10-21 DIAGNOSIS — K219 Gastro-esophageal reflux disease without esophagitis: Secondary | ICD-10-CM | POA: Diagnosis not present

## 2014-10-21 DIAGNOSIS — M4802 Spinal stenosis, cervical region: Secondary | ICD-10-CM | POA: Diagnosis not present

## 2014-10-21 DIAGNOSIS — R54 Age-related physical debility: Secondary | ICD-10-CM | POA: Diagnosis not present

## 2014-10-21 DIAGNOSIS — F419 Anxiety disorder, unspecified: Secondary | ICD-10-CM | POA: Diagnosis not present

## 2014-10-21 DIAGNOSIS — F0390 Unspecified dementia without behavioral disturbance: Secondary | ICD-10-CM | POA: Diagnosis not present

## 2014-10-21 DIAGNOSIS — I129 Hypertensive chronic kidney disease with stage 1 through stage 4 chronic kidney disease, or unspecified chronic kidney disease: Secondary | ICD-10-CM | POA: Diagnosis not present

## 2014-10-21 DIAGNOSIS — E669 Obesity, unspecified: Secondary | ICD-10-CM | POA: Diagnosis not present

## 2014-10-21 DIAGNOSIS — J45909 Unspecified asthma, uncomplicated: Secondary | ICD-10-CM | POA: Diagnosis not present

## 2014-10-21 DIAGNOSIS — F329 Major depressive disorder, single episode, unspecified: Secondary | ICD-10-CM | POA: Diagnosis not present

## 2014-10-21 DIAGNOSIS — Z79899 Other long term (current) drug therapy: Secondary | ICD-10-CM | POA: Diagnosis not present

## 2014-10-21 DIAGNOSIS — G959 Disease of spinal cord, unspecified: Secondary | ICD-10-CM | POA: Diagnosis not present

## 2014-10-21 DIAGNOSIS — N182 Chronic kidney disease, stage 2 (mild): Secondary | ICD-10-CM | POA: Diagnosis not present

## 2014-10-21 DIAGNOSIS — Z6832 Body mass index (BMI) 32.0-32.9, adult: Secondary | ICD-10-CM | POA: Diagnosis not present

## 2014-11-04 DIAGNOSIS — M6281 Muscle weakness (generalized): Secondary | ICD-10-CM | POA: Diagnosis not present

## 2014-11-04 DIAGNOSIS — Z4889 Encounter for other specified surgical aftercare: Secondary | ICD-10-CM | POA: Diagnosis not present

## 2014-11-04 DIAGNOSIS — R262 Difficulty in walking, not elsewhere classified: Secondary | ICD-10-CM | POA: Diagnosis not present

## 2014-11-09 DIAGNOSIS — R262 Difficulty in walking, not elsewhere classified: Secondary | ICD-10-CM | POA: Diagnosis not present

## 2014-11-09 DIAGNOSIS — M6281 Muscle weakness (generalized): Secondary | ICD-10-CM | POA: Diagnosis not present

## 2014-11-09 DIAGNOSIS — Z4889 Encounter for other specified surgical aftercare: Secondary | ICD-10-CM | POA: Diagnosis not present

## 2014-11-11 DIAGNOSIS — Z6831 Body mass index (BMI) 31.0-31.9, adult: Secondary | ICD-10-CM | POA: Diagnosis not present

## 2014-11-11 DIAGNOSIS — G4700 Insomnia, unspecified: Secondary | ICD-10-CM | POA: Insufficient documentation

## 2014-11-11 DIAGNOSIS — Z9889 Other specified postprocedural states: Secondary | ICD-10-CM | POA: Diagnosis not present

## 2014-11-11 DIAGNOSIS — F513 Sleepwalking [somnambulism]: Secondary | ICD-10-CM | POA: Diagnosis not present

## 2014-11-12 DIAGNOSIS — Z4889 Encounter for other specified surgical aftercare: Secondary | ICD-10-CM | POA: Diagnosis not present

## 2014-11-12 DIAGNOSIS — M6281 Muscle weakness (generalized): Secondary | ICD-10-CM | POA: Diagnosis not present

## 2014-11-12 DIAGNOSIS — R262 Difficulty in walking, not elsewhere classified: Secondary | ICD-10-CM | POA: Diagnosis not present

## 2014-11-16 DIAGNOSIS — M6281 Muscle weakness (generalized): Secondary | ICD-10-CM | POA: Diagnosis not present

## 2014-11-16 DIAGNOSIS — R262 Difficulty in walking, not elsewhere classified: Secondary | ICD-10-CM | POA: Diagnosis not present

## 2014-11-16 DIAGNOSIS — Z4889 Encounter for other specified surgical aftercare: Secondary | ICD-10-CM | POA: Diagnosis not present

## 2014-11-18 DIAGNOSIS — E118 Type 2 diabetes mellitus with unspecified complications: Secondary | ICD-10-CM | POA: Diagnosis not present

## 2014-11-18 DIAGNOSIS — M542 Cervicalgia: Secondary | ICD-10-CM | POA: Diagnosis not present

## 2014-11-18 DIAGNOSIS — M6281 Muscle weakness (generalized): Secondary | ICD-10-CM | POA: Diagnosis not present

## 2014-11-18 DIAGNOSIS — I10 Essential (primary) hypertension: Secondary | ICD-10-CM | POA: Diagnosis not present

## 2014-11-18 DIAGNOSIS — Z4889 Encounter for other specified surgical aftercare: Secondary | ICD-10-CM | POA: Diagnosis not present

## 2014-11-18 DIAGNOSIS — R262 Difficulty in walking, not elsewhere classified: Secondary | ICD-10-CM | POA: Diagnosis not present

## 2014-11-23 DIAGNOSIS — R262 Difficulty in walking, not elsewhere classified: Secondary | ICD-10-CM | POA: Diagnosis not present

## 2014-11-23 DIAGNOSIS — E538 Deficiency of other specified B group vitamins: Secondary | ICD-10-CM | POA: Diagnosis not present

## 2014-11-23 DIAGNOSIS — G309 Alzheimer's disease, unspecified: Secondary | ICD-10-CM | POA: Diagnosis not present

## 2014-11-23 DIAGNOSIS — Z4889 Encounter for other specified surgical aftercare: Secondary | ICD-10-CM | POA: Diagnosis not present

## 2014-11-23 DIAGNOSIS — F05 Delirium due to known physiological condition: Secondary | ICD-10-CM | POA: Diagnosis not present

## 2014-11-23 DIAGNOSIS — M6281 Muscle weakness (generalized): Secondary | ICD-10-CM | POA: Diagnosis not present

## 2014-11-24 DIAGNOSIS — N183 Chronic kidney disease, stage 3 (moderate): Secondary | ICD-10-CM | POA: Diagnosis present

## 2014-11-24 DIAGNOSIS — R5381 Other malaise: Secondary | ICD-10-CM | POA: Diagnosis not present

## 2014-11-24 DIAGNOSIS — G4733 Obstructive sleep apnea (adult) (pediatric): Secondary | ICD-10-CM | POA: Diagnosis present

## 2014-11-24 DIAGNOSIS — I10 Essential (primary) hypertension: Secondary | ICD-10-CM | POA: Diagnosis not present

## 2014-11-24 DIAGNOSIS — I517 Cardiomegaly: Secondary | ICD-10-CM | POA: Diagnosis not present

## 2014-11-24 DIAGNOSIS — R001 Bradycardia, unspecified: Secondary | ICD-10-CM | POA: Diagnosis not present

## 2014-11-24 DIAGNOSIS — I951 Orthostatic hypotension: Secondary | ICD-10-CM | POA: Diagnosis not present

## 2014-11-24 DIAGNOSIS — F0390 Unspecified dementia without behavioral disturbance: Secondary | ICD-10-CM | POA: Diagnosis not present

## 2014-11-24 DIAGNOSIS — Z6832 Body mass index (BMI) 32.0-32.9, adult: Secondary | ICD-10-CM | POA: Diagnosis not present

## 2014-11-24 DIAGNOSIS — R05 Cough: Secondary | ICD-10-CM | POA: Diagnosis not present

## 2014-11-24 DIAGNOSIS — R404 Transient alteration of awareness: Secondary | ICD-10-CM | POA: Diagnosis not present

## 2014-11-24 DIAGNOSIS — I959 Hypotension, unspecified: Secondary | ICD-10-CM | POA: Diagnosis not present

## 2014-11-24 DIAGNOSIS — Z79899 Other long term (current) drug therapy: Secondary | ICD-10-CM | POA: Diagnosis not present

## 2014-11-24 DIAGNOSIS — R531 Weakness: Secondary | ICD-10-CM | POA: Diagnosis not present

## 2014-11-24 DIAGNOSIS — E669 Obesity, unspecified: Secondary | ICD-10-CM | POA: Diagnosis present

## 2014-11-24 DIAGNOSIS — E119 Type 2 diabetes mellitus without complications: Secondary | ICD-10-CM | POA: Diagnosis not present

## 2014-11-24 DIAGNOSIS — R42 Dizziness and giddiness: Secondary | ICD-10-CM | POA: Diagnosis not present

## 2014-11-24 DIAGNOSIS — I129 Hypertensive chronic kidney disease with stage 1 through stage 4 chronic kidney disease, or unspecified chronic kidney disease: Secondary | ICD-10-CM | POA: Diagnosis present

## 2014-11-24 DIAGNOSIS — K219 Gastro-esophageal reflux disease without esophagitis: Secondary | ICD-10-CM | POA: Diagnosis present

## 2014-11-24 DIAGNOSIS — F419 Anxiety disorder, unspecified: Secondary | ICD-10-CM | POA: Diagnosis present

## 2014-11-24 DIAGNOSIS — F329 Major depressive disorder, single episode, unspecified: Secondary | ICD-10-CM | POA: Diagnosis present

## 2014-11-25 DIAGNOSIS — I959 Hypotension, unspecified: Secondary | ICD-10-CM | POA: Diagnosis not present

## 2014-11-25 DIAGNOSIS — E119 Type 2 diabetes mellitus without complications: Secondary | ICD-10-CM | POA: Diagnosis not present

## 2014-11-25 DIAGNOSIS — R5381 Other malaise: Secondary | ICD-10-CM | POA: Diagnosis not present

## 2014-11-25 DIAGNOSIS — I951 Orthostatic hypotension: Secondary | ICD-10-CM | POA: Diagnosis not present

## 2014-11-25 DIAGNOSIS — I129 Hypertensive chronic kidney disease with stage 1 through stage 4 chronic kidney disease, or unspecified chronic kidney disease: Secondary | ICD-10-CM | POA: Diagnosis not present

## 2014-11-25 DIAGNOSIS — R001 Bradycardia, unspecified: Secondary | ICD-10-CM | POA: Diagnosis not present

## 2014-11-25 DIAGNOSIS — N183 Chronic kidney disease, stage 3 (moderate): Secondary | ICD-10-CM | POA: Diagnosis not present

## 2014-11-25 DIAGNOSIS — F0390 Unspecified dementia without behavioral disturbance: Secondary | ICD-10-CM | POA: Diagnosis not present

## 2014-11-25 DIAGNOSIS — I10 Essential (primary) hypertension: Secondary | ICD-10-CM | POA: Diagnosis not present

## 2014-11-28 DIAGNOSIS — Z4889 Encounter for other specified surgical aftercare: Secondary | ICD-10-CM | POA: Diagnosis not present

## 2014-11-28 DIAGNOSIS — R262 Difficulty in walking, not elsewhere classified: Secondary | ICD-10-CM | POA: Diagnosis not present

## 2014-11-28 DIAGNOSIS — M6281 Muscle weakness (generalized): Secondary | ICD-10-CM | POA: Diagnosis not present

## 2014-11-29 DIAGNOSIS — Z4889 Encounter for other specified surgical aftercare: Secondary | ICD-10-CM | POA: Diagnosis not present

## 2014-11-29 DIAGNOSIS — M6281 Muscle weakness (generalized): Secondary | ICD-10-CM | POA: Diagnosis not present

## 2014-11-29 DIAGNOSIS — R262 Difficulty in walking, not elsewhere classified: Secondary | ICD-10-CM | POA: Diagnosis not present

## 2014-11-30 DIAGNOSIS — H4011X4 Primary open-angle glaucoma, indeterminate stage: Secondary | ICD-10-CM | POA: Diagnosis not present

## 2014-11-30 DIAGNOSIS — E119 Type 2 diabetes mellitus without complications: Secondary | ICD-10-CM | POA: Diagnosis not present

## 2014-11-30 DIAGNOSIS — H35362 Drusen (degenerative) of macula, left eye: Secondary | ICD-10-CM | POA: Diagnosis not present

## 2014-12-01 DIAGNOSIS — R262 Difficulty in walking, not elsewhere classified: Secondary | ICD-10-CM | POA: Diagnosis not present

## 2014-12-01 DIAGNOSIS — Z4889 Encounter for other specified surgical aftercare: Secondary | ICD-10-CM | POA: Diagnosis not present

## 2014-12-01 DIAGNOSIS — M6281 Muscle weakness (generalized): Secondary | ICD-10-CM | POA: Diagnosis not present

## 2014-12-03 DIAGNOSIS — R262 Difficulty in walking, not elsewhere classified: Secondary | ICD-10-CM | POA: Diagnosis not present

## 2014-12-03 DIAGNOSIS — F0280 Dementia in other diseases classified elsewhere without behavioral disturbance: Secondary | ICD-10-CM | POA: Insufficient documentation

## 2014-12-03 DIAGNOSIS — Z4889 Encounter for other specified surgical aftercare: Secondary | ICD-10-CM | POA: Diagnosis not present

## 2014-12-03 DIAGNOSIS — M6281 Muscle weakness (generalized): Secondary | ICD-10-CM | POA: Diagnosis not present

## 2014-12-07 DIAGNOSIS — M6281 Muscle weakness (generalized): Secondary | ICD-10-CM | POA: Diagnosis not present

## 2014-12-07 DIAGNOSIS — R262 Difficulty in walking, not elsewhere classified: Secondary | ICD-10-CM | POA: Diagnosis not present

## 2014-12-07 DIAGNOSIS — Z4889 Encounter for other specified surgical aftercare: Secondary | ICD-10-CM | POA: Diagnosis not present

## 2014-12-10 ENCOUNTER — Encounter: Payer: Self-pay | Admitting: Internal Medicine

## 2014-12-10 DIAGNOSIS — Z4889 Encounter for other specified surgical aftercare: Secondary | ICD-10-CM | POA: Diagnosis not present

## 2014-12-10 DIAGNOSIS — R262 Difficulty in walking, not elsewhere classified: Secondary | ICD-10-CM | POA: Diagnosis not present

## 2014-12-10 DIAGNOSIS — M6281 Muscle weakness (generalized): Secondary | ICD-10-CM | POA: Diagnosis not present

## 2014-12-16 DIAGNOSIS — E785 Hyperlipidemia, unspecified: Secondary | ICD-10-CM | POA: Diagnosis not present

## 2014-12-16 DIAGNOSIS — E1129 Type 2 diabetes mellitus with other diabetic kidney complication: Secondary | ICD-10-CM | POA: Diagnosis not present

## 2014-12-16 DIAGNOSIS — N183 Chronic kidney disease, stage 3 (moderate): Secondary | ICD-10-CM | POA: Diagnosis not present

## 2014-12-16 DIAGNOSIS — E118 Type 2 diabetes mellitus with unspecified complications: Secondary | ICD-10-CM | POA: Diagnosis not present

## 2014-12-16 DIAGNOSIS — E538 Deficiency of other specified B group vitamins: Secondary | ICD-10-CM | POA: Diagnosis not present

## 2014-12-16 DIAGNOSIS — G3184 Mild cognitive impairment, so stated: Secondary | ICD-10-CM | POA: Diagnosis not present

## 2014-12-16 DIAGNOSIS — Z6832 Body mass index (BMI) 32.0-32.9, adult: Secondary | ICD-10-CM | POA: Diagnosis not present

## 2014-12-16 DIAGNOSIS — G4700 Insomnia, unspecified: Secondary | ICD-10-CM | POA: Diagnosis not present

## 2014-12-16 DIAGNOSIS — I10 Essential (primary) hypertension: Secondary | ICD-10-CM | POA: Diagnosis not present

## 2014-12-17 DIAGNOSIS — R262 Difficulty in walking, not elsewhere classified: Secondary | ICD-10-CM | POA: Diagnosis not present

## 2014-12-17 DIAGNOSIS — M6281 Muscle weakness (generalized): Secondary | ICD-10-CM | POA: Diagnosis not present

## 2014-12-17 DIAGNOSIS — Z4889 Encounter for other specified surgical aftercare: Secondary | ICD-10-CM | POA: Diagnosis not present

## 2014-12-21 DIAGNOSIS — E789 Disorder of lipoprotein metabolism, unspecified: Secondary | ICD-10-CM | POA: Diagnosis not present

## 2014-12-21 DIAGNOSIS — I10 Essential (primary) hypertension: Secondary | ICD-10-CM | POA: Diagnosis not present

## 2014-12-21 DIAGNOSIS — E118 Type 2 diabetes mellitus with unspecified complications: Secondary | ICD-10-CM | POA: Diagnosis not present

## 2014-12-24 DIAGNOSIS — Z961 Presence of intraocular lens: Secondary | ICD-10-CM | POA: Diagnosis not present

## 2014-12-24 DIAGNOSIS — H4011X1 Primary open-angle glaucoma, mild stage: Secondary | ICD-10-CM | POA: Diagnosis not present

## 2015-01-05 DIAGNOSIS — R197 Diarrhea, unspecified: Secondary | ICD-10-CM | POA: Diagnosis not present

## 2015-01-06 DIAGNOSIS — M4322 Fusion of spine, cervical region: Secondary | ICD-10-CM | POA: Diagnosis not present

## 2015-01-06 DIAGNOSIS — Z981 Arthrodesis status: Secondary | ICD-10-CM | POA: Diagnosis not present

## 2015-01-06 DIAGNOSIS — M5001 Cervical disc disorder with myelopathy,  high cervical region: Secondary | ICD-10-CM | POA: Diagnosis not present

## 2015-01-07 DIAGNOSIS — Z881 Allergy status to other antibiotic agents status: Secondary | ICD-10-CM | POA: Diagnosis not present

## 2015-01-07 DIAGNOSIS — G4733 Obstructive sleep apnea (adult) (pediatric): Secondary | ICD-10-CM | POA: Diagnosis present

## 2015-01-07 DIAGNOSIS — Z91013 Allergy to seafood: Secondary | ICD-10-CM | POA: Diagnosis not present

## 2015-01-07 DIAGNOSIS — F329 Major depressive disorder, single episode, unspecified: Secondary | ICD-10-CM | POA: Diagnosis present

## 2015-01-07 DIAGNOSIS — R531 Weakness: Secondary | ICD-10-CM | POA: Diagnosis not present

## 2015-01-07 DIAGNOSIS — K219 Gastro-esophageal reflux disease without esophagitis: Secondary | ICD-10-CM | POA: Diagnosis present

## 2015-01-07 DIAGNOSIS — E039 Hypothyroidism, unspecified: Secondary | ICD-10-CM | POA: Diagnosis present

## 2015-01-07 DIAGNOSIS — J45909 Unspecified asthma, uncomplicated: Secondary | ICD-10-CM | POA: Diagnosis not present

## 2015-01-07 DIAGNOSIS — Z7951 Long term (current) use of inhaled steroids: Secondary | ICD-10-CM | POA: Diagnosis not present

## 2015-01-07 DIAGNOSIS — E119 Type 2 diabetes mellitus without complications: Secondary | ICD-10-CM | POA: Diagnosis not present

## 2015-01-07 DIAGNOSIS — I129 Hypertensive chronic kidney disease with stage 1 through stage 4 chronic kidney disease, or unspecified chronic kidney disease: Secondary | ICD-10-CM | POA: Diagnosis present

## 2015-01-07 DIAGNOSIS — M1990 Unspecified osteoarthritis, unspecified site: Secondary | ICD-10-CM | POA: Diagnosis present

## 2015-01-07 DIAGNOSIS — R569 Unspecified convulsions: Secondary | ICD-10-CM | POA: Diagnosis not present

## 2015-01-07 DIAGNOSIS — I10 Essential (primary) hypertension: Secondary | ICD-10-CM | POA: Diagnosis not present

## 2015-01-07 DIAGNOSIS — Z7982 Long term (current) use of aspirin: Secondary | ICD-10-CM | POA: Diagnosis not present

## 2015-01-07 DIAGNOSIS — G311 Senile degeneration of brain, not elsewhere classified: Secondary | ICD-10-CM | POA: Diagnosis not present

## 2015-01-07 DIAGNOSIS — R4182 Altered mental status, unspecified: Secondary | ICD-10-CM | POA: Diagnosis not present

## 2015-01-07 DIAGNOSIS — G40901 Epilepsy, unspecified, not intractable, with status epilepticus: Secondary | ICD-10-CM | POA: Diagnosis not present

## 2015-01-07 DIAGNOSIS — E780 Pure hypercholesterolemia: Secondary | ICD-10-CM | POA: Diagnosis present

## 2015-01-07 DIAGNOSIS — F419 Anxiety disorder, unspecified: Secondary | ICD-10-CM | POA: Diagnosis present

## 2015-01-07 DIAGNOSIS — N189 Chronic kidney disease, unspecified: Secondary | ICD-10-CM | POA: Diagnosis not present

## 2015-01-10 DIAGNOSIS — F0280 Dementia in other diseases classified elsewhere without behavioral disturbance: Secondary | ICD-10-CM | POA: Diagnosis not present

## 2015-01-10 DIAGNOSIS — I129 Hypertensive chronic kidney disease with stage 1 through stage 4 chronic kidney disease, or unspecified chronic kidney disease: Secondary | ICD-10-CM | POA: Diagnosis not present

## 2015-01-10 DIAGNOSIS — R259 Unspecified abnormal involuntary movements: Secondary | ICD-10-CM | POA: Diagnosis not present

## 2015-01-10 DIAGNOSIS — N189 Chronic kidney disease, unspecified: Secondary | ICD-10-CM | POA: Diagnosis not present

## 2015-01-10 DIAGNOSIS — G9389 Other specified disorders of brain: Secondary | ICD-10-CM | POA: Diagnosis present

## 2015-01-10 DIAGNOSIS — Z7982 Long term (current) use of aspirin: Secondary | ICD-10-CM | POA: Diagnosis not present

## 2015-01-10 DIAGNOSIS — E785 Hyperlipidemia, unspecified: Secondary | ICD-10-CM | POA: Diagnosis not present

## 2015-01-10 DIAGNOSIS — Z66 Do not resuscitate: Secondary | ICD-10-CM | POA: Diagnosis present

## 2015-01-10 DIAGNOSIS — R4789 Other speech disturbances: Secondary | ICD-10-CM | POA: Diagnosis not present

## 2015-01-10 DIAGNOSIS — I10 Essential (primary) hypertension: Secondary | ICD-10-CM | POA: Diagnosis not present

## 2015-01-10 DIAGNOSIS — G4733 Obstructive sleep apnea (adult) (pediatric): Secondary | ICD-10-CM | POA: Diagnosis present

## 2015-01-10 DIAGNOSIS — G4089 Other seizures: Secondary | ICD-10-CM | POA: Diagnosis present

## 2015-01-10 DIAGNOSIS — G40901 Epilepsy, unspecified, not intractable, with status epilepticus: Secondary | ICD-10-CM | POA: Diagnosis not present

## 2015-01-10 DIAGNOSIS — G309 Alzheimer's disease, unspecified: Secondary | ICD-10-CM | POA: Diagnosis not present

## 2015-01-10 DIAGNOSIS — N183 Chronic kidney disease, stage 3 (moderate): Secondary | ICD-10-CM | POA: Diagnosis present

## 2015-01-10 DIAGNOSIS — E119 Type 2 diabetes mellitus without complications: Secondary | ICD-10-CM | POA: Diagnosis not present

## 2015-01-10 DIAGNOSIS — R251 Tremor, unspecified: Secondary | ICD-10-CM | POA: Diagnosis not present

## 2015-01-10 DIAGNOSIS — J45909 Unspecified asthma, uncomplicated: Secondary | ICD-10-CM | POA: Diagnosis not present

## 2015-01-10 DIAGNOSIS — R569 Unspecified convulsions: Secondary | ICD-10-CM | POA: Diagnosis not present

## 2015-01-10 DIAGNOSIS — R064 Hyperventilation: Secondary | ICD-10-CM | POA: Diagnosis present

## 2015-01-13 DIAGNOSIS — R251 Tremor, unspecified: Secondary | ICD-10-CM | POA: Insufficient documentation

## 2015-01-21 DIAGNOSIS — Z0000 Encounter for general adult medical examination without abnormal findings: Secondary | ICD-10-CM | POA: Diagnosis not present

## 2015-01-21 DIAGNOSIS — G309 Alzheimer's disease, unspecified: Secondary | ICD-10-CM | POA: Diagnosis not present

## 2015-01-21 DIAGNOSIS — J45909 Unspecified asthma, uncomplicated: Secondary | ICD-10-CM | POA: Diagnosis not present

## 2015-01-21 DIAGNOSIS — J449 Chronic obstructive pulmonary disease, unspecified: Secondary | ICD-10-CM | POA: Diagnosis not present

## 2015-01-21 DIAGNOSIS — E039 Hypothyroidism, unspecified: Secondary | ICD-10-CM | POA: Diagnosis not present

## 2015-01-21 DIAGNOSIS — N289 Disorder of kidney and ureter, unspecified: Secondary | ICD-10-CM | POA: Diagnosis not present

## 2015-01-21 DIAGNOSIS — Z79899 Other long term (current) drug therapy: Secondary | ICD-10-CM | POA: Diagnosis not present

## 2015-01-21 DIAGNOSIS — E780 Pure hypercholesterolemia: Secondary | ICD-10-CM | POA: Diagnosis not present

## 2015-01-21 DIAGNOSIS — I10 Essential (primary) hypertension: Secondary | ICD-10-CM | POA: Diagnosis not present

## 2015-01-21 DIAGNOSIS — Z7982 Long term (current) use of aspirin: Secondary | ICD-10-CM | POA: Diagnosis not present

## 2015-01-21 DIAGNOSIS — F0391 Unspecified dementia with behavioral disturbance: Secondary | ICD-10-CM | POA: Diagnosis not present

## 2015-01-21 DIAGNOSIS — F0390 Unspecified dementia without behavioral disturbance: Secondary | ICD-10-CM | POA: Diagnosis not present

## 2015-01-21 DIAGNOSIS — E119 Type 2 diabetes mellitus without complications: Secondary | ICD-10-CM | POA: Diagnosis not present

## 2015-01-22 DIAGNOSIS — F0390 Unspecified dementia without behavioral disturbance: Secondary | ICD-10-CM | POA: Diagnosis not present

## 2015-01-22 DIAGNOSIS — E1122 Type 2 diabetes mellitus with diabetic chronic kidney disease: Secondary | ICD-10-CM | POA: Diagnosis not present

## 2015-01-22 DIAGNOSIS — N183 Chronic kidney disease, stage 3 (moderate): Secondary | ICD-10-CM | POA: Diagnosis not present

## 2015-01-22 DIAGNOSIS — Z0000 Encounter for general adult medical examination without abnormal findings: Secondary | ICD-10-CM | POA: Diagnosis not present

## 2015-01-22 DIAGNOSIS — F0391 Unspecified dementia with behavioral disturbance: Secondary | ICD-10-CM | POA: Diagnosis not present

## 2015-01-22 DIAGNOSIS — J45909 Unspecified asthma, uncomplicated: Secondary | ICD-10-CM | POA: Diagnosis not present

## 2015-01-22 DIAGNOSIS — F319 Bipolar disorder, unspecified: Secondary | ICD-10-CM | POA: Diagnosis not present

## 2015-01-22 DIAGNOSIS — N179 Acute kidney failure, unspecified: Secondary | ICD-10-CM | POA: Diagnosis not present

## 2015-01-24 DIAGNOSIS — F310 Bipolar disorder, current episode hypomanic: Secondary | ICD-10-CM | POA: Diagnosis not present

## 2015-01-24 DIAGNOSIS — Z79899 Other long term (current) drug therapy: Secondary | ICD-10-CM | POA: Diagnosis not present

## 2015-01-24 DIAGNOSIS — T433X5A Adverse effect of phenothiazine antipsychotics and neuroleptics, initial encounter: Secondary | ICD-10-CM | POA: Diagnosis not present

## 2015-01-24 DIAGNOSIS — Z91013 Allergy to seafood: Secondary | ICD-10-CM | POA: Diagnosis not present

## 2015-01-24 DIAGNOSIS — R05 Cough: Secondary | ICD-10-CM | POA: Diagnosis not present

## 2015-01-24 DIAGNOSIS — J45909 Unspecified asthma, uncomplicated: Secondary | ICD-10-CM | POA: Diagnosis present

## 2015-01-24 DIAGNOSIS — E860 Dehydration: Secondary | ICD-10-CM | POA: Diagnosis not present

## 2015-01-24 DIAGNOSIS — N183 Chronic kidney disease, stage 3 (moderate): Secondary | ICD-10-CM | POA: Diagnosis not present

## 2015-01-24 DIAGNOSIS — F319 Bipolar disorder, unspecified: Secondary | ICD-10-CM | POA: Insufficient documentation

## 2015-01-24 DIAGNOSIS — N179 Acute kidney failure, unspecified: Secondary | ICD-10-CM | POA: Diagnosis not present

## 2015-01-24 DIAGNOSIS — E1122 Type 2 diabetes mellitus with diabetic chronic kidney disease: Secondary | ICD-10-CM | POA: Diagnosis present

## 2015-01-24 DIAGNOSIS — G4733 Obstructive sleep apnea (adult) (pediatric): Secondary | ICD-10-CM | POA: Diagnosis not present

## 2015-01-24 DIAGNOSIS — I129 Hypertensive chronic kidney disease with stage 1 through stage 4 chronic kidney disease, or unspecified chronic kidney disease: Secondary | ICD-10-CM | POA: Diagnosis present

## 2015-01-24 DIAGNOSIS — E785 Hyperlipidemia, unspecified: Secondary | ICD-10-CM | POA: Diagnosis not present

## 2015-01-24 DIAGNOSIS — Z7982 Long term (current) use of aspirin: Secondary | ICD-10-CM | POA: Diagnosis not present

## 2015-01-24 DIAGNOSIS — J449 Chronic obstructive pulmonary disease, unspecified: Secondary | ICD-10-CM | POA: Diagnosis present

## 2015-01-24 DIAGNOSIS — I10 Essential (primary) hypertension: Secondary | ICD-10-CM | POA: Diagnosis not present

## 2015-01-24 DIAGNOSIS — Z9689 Presence of other specified functional implants: Secondary | ICD-10-CM | POA: Diagnosis present

## 2015-01-24 DIAGNOSIS — E119 Type 2 diabetes mellitus without complications: Secondary | ICD-10-CM | POA: Diagnosis not present

## 2015-01-24 DIAGNOSIS — N400 Enlarged prostate without lower urinary tract symptoms: Secondary | ICD-10-CM | POA: Diagnosis present

## 2015-01-24 DIAGNOSIS — F0390 Unspecified dementia without behavioral disturbance: Secondary | ICD-10-CM | POA: Diagnosis present

## 2015-01-24 DIAGNOSIS — Z9119 Patient's noncompliance with other medical treatment and regimen: Secondary | ICD-10-CM | POA: Diagnosis not present

## 2015-01-24 DIAGNOSIS — R4182 Altered mental status, unspecified: Secondary | ICD-10-CM | POA: Diagnosis not present

## 2015-01-24 DIAGNOSIS — F29 Unspecified psychosis not due to a substance or known physiological condition: Secondary | ICD-10-CM | POA: Diagnosis not present

## 2015-01-25 DIAGNOSIS — G4733 Obstructive sleep apnea (adult) (pediatric): Secondary | ICD-10-CM | POA: Insufficient documentation

## 2015-01-25 DIAGNOSIS — N400 Enlarged prostate without lower urinary tract symptoms: Secondary | ICD-10-CM | POA: Insufficient documentation

## 2015-01-25 DIAGNOSIS — E119 Type 2 diabetes mellitus without complications: Secondary | ICD-10-CM | POA: Insufficient documentation

## 2015-01-26 DIAGNOSIS — E119 Type 2 diabetes mellitus without complications: Secondary | ICD-10-CM | POA: Insufficient documentation

## 2015-01-26 DIAGNOSIS — N183 Chronic kidney disease, stage 3 unspecified: Secondary | ICD-10-CM | POA: Insufficient documentation

## 2015-01-26 DIAGNOSIS — R001 Bradycardia, unspecified: Secondary | ICD-10-CM | POA: Insufficient documentation

## 2015-01-28 ENCOUNTER — Ambulatory Visit: Payer: Medicare Other | Admitting: Adult Health

## 2015-02-08 ENCOUNTER — Telehealth: Payer: Self-pay | Admitting: Pulmonary Disease

## 2015-02-08 NOTE — Telephone Encounter (Signed)
Received request for CPAP supplies (Chin Strap) from [employer].   Per Dr. Elsworth Soho, Patient will need to schedule an appointment. Called and left detailed message on voicemail advising patient to call to schedule appointment. Nothing further needed.

## 2015-02-25 DIAGNOSIS — I129 Hypertensive chronic kidney disease with stage 1 through stage 4 chronic kidney disease, or unspecified chronic kidney disease: Secondary | ICD-10-CM | POA: Diagnosis not present

## 2015-02-25 DIAGNOSIS — N183 Chronic kidney disease, stage 3 (moderate): Secondary | ICD-10-CM | POA: Diagnosis not present

## 2015-02-25 DIAGNOSIS — F319 Bipolar disorder, unspecified: Secondary | ICD-10-CM | POA: Diagnosis not present

## 2015-02-25 DIAGNOSIS — M150 Primary generalized (osteo)arthritis: Secondary | ICD-10-CM | POA: Diagnosis not present

## 2015-02-25 DIAGNOSIS — R2689 Other abnormalities of gait and mobility: Secondary | ICD-10-CM | POA: Diagnosis not present

## 2015-02-25 DIAGNOSIS — E119 Type 2 diabetes mellitus without complications: Secondary | ICD-10-CM | POA: Diagnosis not present

## 2015-02-25 DIAGNOSIS — E785 Hyperlipidemia, unspecified: Secondary | ICD-10-CM | POA: Diagnosis not present

## 2015-03-01 DIAGNOSIS — R2689 Other abnormalities of gait and mobility: Secondary | ICD-10-CM | POA: Diagnosis not present

## 2015-03-01 DIAGNOSIS — I129 Hypertensive chronic kidney disease with stage 1 through stage 4 chronic kidney disease, or unspecified chronic kidney disease: Secondary | ICD-10-CM | POA: Diagnosis not present

## 2015-03-01 DIAGNOSIS — E119 Type 2 diabetes mellitus without complications: Secondary | ICD-10-CM | POA: Diagnosis not present

## 2015-03-01 DIAGNOSIS — M150 Primary generalized (osteo)arthritis: Secondary | ICD-10-CM | POA: Diagnosis not present

## 2015-03-01 DIAGNOSIS — F319 Bipolar disorder, unspecified: Secondary | ICD-10-CM | POA: Diagnosis not present

## 2015-03-01 DIAGNOSIS — N183 Chronic kidney disease, stage 3 (moderate): Secondary | ICD-10-CM | POA: Diagnosis not present

## 2015-03-03 DIAGNOSIS — F3110 Bipolar disorder, current episode manic without psychotic features, unspecified: Secondary | ICD-10-CM | POA: Diagnosis not present

## 2015-03-03 DIAGNOSIS — F319 Bipolar disorder, unspecified: Secondary | ICD-10-CM | POA: Diagnosis not present

## 2015-03-03 DIAGNOSIS — E538 Deficiency of other specified B group vitamins: Secondary | ICD-10-CM | POA: Diagnosis not present

## 2015-03-03 DIAGNOSIS — G3184 Mild cognitive impairment, so stated: Secondary | ICD-10-CM | POA: Diagnosis not present

## 2015-03-03 DIAGNOSIS — I129 Hypertensive chronic kidney disease with stage 1 through stage 4 chronic kidney disease, or unspecified chronic kidney disease: Secondary | ICD-10-CM | POA: Diagnosis not present

## 2015-03-03 DIAGNOSIS — R2689 Other abnormalities of gait and mobility: Secondary | ICD-10-CM | POA: Diagnosis not present

## 2015-03-03 DIAGNOSIS — N183 Chronic kidney disease, stage 3 (moderate): Secondary | ICD-10-CM | POA: Diagnosis not present

## 2015-03-03 DIAGNOSIS — Z6832 Body mass index (BMI) 32.0-32.9, adult: Secondary | ICD-10-CM | POA: Diagnosis not present

## 2015-03-03 DIAGNOSIS — M150 Primary generalized (osteo)arthritis: Secondary | ICD-10-CM | POA: Diagnosis not present

## 2015-03-03 DIAGNOSIS — G4700 Insomnia, unspecified: Secondary | ICD-10-CM | POA: Diagnosis not present

## 2015-03-03 DIAGNOSIS — E119 Type 2 diabetes mellitus without complications: Secondary | ICD-10-CM | POA: Diagnosis not present

## 2015-03-04 DIAGNOSIS — M150 Primary generalized (osteo)arthritis: Secondary | ICD-10-CM | POA: Diagnosis not present

## 2015-03-04 DIAGNOSIS — I129 Hypertensive chronic kidney disease with stage 1 through stage 4 chronic kidney disease, or unspecified chronic kidney disease: Secondary | ICD-10-CM | POA: Diagnosis not present

## 2015-03-04 DIAGNOSIS — F319 Bipolar disorder, unspecified: Secondary | ICD-10-CM | POA: Diagnosis not present

## 2015-03-04 DIAGNOSIS — E119 Type 2 diabetes mellitus without complications: Secondary | ICD-10-CM | POA: Diagnosis not present

## 2015-03-04 DIAGNOSIS — R2689 Other abnormalities of gait and mobility: Secondary | ICD-10-CM | POA: Diagnosis not present

## 2015-03-04 DIAGNOSIS — N183 Chronic kidney disease, stage 3 (moderate): Secondary | ICD-10-CM | POA: Diagnosis not present

## 2015-03-08 DIAGNOSIS — E119 Type 2 diabetes mellitus without complications: Secondary | ICD-10-CM | POA: Diagnosis not present

## 2015-03-08 DIAGNOSIS — R2689 Other abnormalities of gait and mobility: Secondary | ICD-10-CM | POA: Diagnosis not present

## 2015-03-08 DIAGNOSIS — M150 Primary generalized (osteo)arthritis: Secondary | ICD-10-CM | POA: Diagnosis not present

## 2015-03-08 DIAGNOSIS — F319 Bipolar disorder, unspecified: Secondary | ICD-10-CM | POA: Diagnosis not present

## 2015-03-08 DIAGNOSIS — I129 Hypertensive chronic kidney disease with stage 1 through stage 4 chronic kidney disease, or unspecified chronic kidney disease: Secondary | ICD-10-CM | POA: Diagnosis not present

## 2015-03-08 DIAGNOSIS — N183 Chronic kidney disease, stage 3 (moderate): Secondary | ICD-10-CM | POA: Diagnosis not present

## 2015-03-10 DIAGNOSIS — M150 Primary generalized (osteo)arthritis: Secondary | ICD-10-CM | POA: Diagnosis not present

## 2015-03-10 DIAGNOSIS — E119 Type 2 diabetes mellitus without complications: Secondary | ICD-10-CM | POA: Diagnosis not present

## 2015-03-10 DIAGNOSIS — R2689 Other abnormalities of gait and mobility: Secondary | ICD-10-CM | POA: Diagnosis not present

## 2015-03-10 DIAGNOSIS — F319 Bipolar disorder, unspecified: Secondary | ICD-10-CM | POA: Diagnosis not present

## 2015-03-10 DIAGNOSIS — I129 Hypertensive chronic kidney disease with stage 1 through stage 4 chronic kidney disease, or unspecified chronic kidney disease: Secondary | ICD-10-CM | POA: Diagnosis not present

## 2015-03-10 DIAGNOSIS — N183 Chronic kidney disease, stage 3 (moderate): Secondary | ICD-10-CM | POA: Diagnosis not present

## 2015-03-12 DIAGNOSIS — N183 Chronic kidney disease, stage 3 (moderate): Secondary | ICD-10-CM | POA: Diagnosis not present

## 2015-03-12 DIAGNOSIS — F319 Bipolar disorder, unspecified: Secondary | ICD-10-CM | POA: Diagnosis not present

## 2015-03-12 DIAGNOSIS — E119 Type 2 diabetes mellitus without complications: Secondary | ICD-10-CM | POA: Diagnosis not present

## 2015-03-12 DIAGNOSIS — I129 Hypertensive chronic kidney disease with stage 1 through stage 4 chronic kidney disease, or unspecified chronic kidney disease: Secondary | ICD-10-CM | POA: Diagnosis not present

## 2015-03-12 DIAGNOSIS — M150 Primary generalized (osteo)arthritis: Secondary | ICD-10-CM | POA: Diagnosis not present

## 2015-03-12 DIAGNOSIS — R2689 Other abnormalities of gait and mobility: Secondary | ICD-10-CM | POA: Diagnosis not present

## 2015-03-16 DIAGNOSIS — I129 Hypertensive chronic kidney disease with stage 1 through stage 4 chronic kidney disease, or unspecified chronic kidney disease: Secondary | ICD-10-CM | POA: Diagnosis not present

## 2015-03-16 DIAGNOSIS — N183 Chronic kidney disease, stage 3 (moderate): Secondary | ICD-10-CM | POA: Diagnosis not present

## 2015-03-16 DIAGNOSIS — E119 Type 2 diabetes mellitus without complications: Secondary | ICD-10-CM | POA: Diagnosis not present

## 2015-03-16 DIAGNOSIS — M150 Primary generalized (osteo)arthritis: Secondary | ICD-10-CM | POA: Diagnosis not present

## 2015-03-16 DIAGNOSIS — F319 Bipolar disorder, unspecified: Secondary | ICD-10-CM | POA: Diagnosis not present

## 2015-03-16 DIAGNOSIS — R2689 Other abnormalities of gait and mobility: Secondary | ICD-10-CM | POA: Diagnosis not present

## 2015-03-18 DIAGNOSIS — M150 Primary generalized (osteo)arthritis: Secondary | ICD-10-CM | POA: Diagnosis not present

## 2015-03-18 DIAGNOSIS — I129 Hypertensive chronic kidney disease with stage 1 through stage 4 chronic kidney disease, or unspecified chronic kidney disease: Secondary | ICD-10-CM | POA: Diagnosis not present

## 2015-03-18 DIAGNOSIS — F319 Bipolar disorder, unspecified: Secondary | ICD-10-CM | POA: Diagnosis not present

## 2015-03-18 DIAGNOSIS — E119 Type 2 diabetes mellitus without complications: Secondary | ICD-10-CM | POA: Diagnosis not present

## 2015-03-18 DIAGNOSIS — N183 Chronic kidney disease, stage 3 (moderate): Secondary | ICD-10-CM | POA: Diagnosis not present

## 2015-03-18 DIAGNOSIS — L600 Ingrowing nail: Secondary | ICD-10-CM | POA: Diagnosis not present

## 2015-03-18 DIAGNOSIS — R2689 Other abnormalities of gait and mobility: Secondary | ICD-10-CM | POA: Diagnosis not present

## 2015-04-14 DIAGNOSIS — N183 Chronic kidney disease, stage 3 (moderate): Secondary | ICD-10-CM | POA: Diagnosis not present

## 2015-04-14 DIAGNOSIS — E1129 Type 2 diabetes mellitus with other diabetic kidney complication: Secondary | ICD-10-CM | POA: Diagnosis not present

## 2015-04-14 DIAGNOSIS — E785 Hyperlipidemia, unspecified: Secondary | ICD-10-CM | POA: Diagnosis not present

## 2015-04-14 DIAGNOSIS — I10 Essential (primary) hypertension: Secondary | ICD-10-CM | POA: Diagnosis not present

## 2015-04-14 DIAGNOSIS — Z23 Encounter for immunization: Secondary | ICD-10-CM | POA: Diagnosis not present

## 2015-04-14 DIAGNOSIS — F3110 Bipolar disorder, current episode manic without psychotic features, unspecified: Secondary | ICD-10-CM | POA: Diagnosis not present

## 2015-04-14 DIAGNOSIS — J45909 Unspecified asthma, uncomplicated: Secondary | ICD-10-CM | POA: Diagnosis not present

## 2015-04-14 DIAGNOSIS — Z6832 Body mass index (BMI) 32.0-32.9, adult: Secondary | ICD-10-CM | POA: Diagnosis not present

## 2015-04-14 DIAGNOSIS — R0600 Dyspnea, unspecified: Secondary | ICD-10-CM | POA: Diagnosis not present

## 2015-04-14 DIAGNOSIS — G3184 Mild cognitive impairment, so stated: Secondary | ICD-10-CM | POA: Diagnosis not present

## 2015-04-14 DIAGNOSIS — F329 Major depressive disorder, single episode, unspecified: Secondary | ICD-10-CM | POA: Diagnosis not present

## 2015-04-14 DIAGNOSIS — E538 Deficiency of other specified B group vitamins: Secondary | ICD-10-CM | POA: Diagnosis not present

## 2015-04-21 DIAGNOSIS — R569 Unspecified convulsions: Secondary | ICD-10-CM | POA: Diagnosis not present

## 2015-04-21 DIAGNOSIS — R251 Tremor, unspecified: Secondary | ICD-10-CM | POA: Diagnosis not present

## 2015-04-21 DIAGNOSIS — G3184 Mild cognitive impairment, so stated: Secondary | ICD-10-CM | POA: Diagnosis not present

## 2015-04-22 DIAGNOSIS — H6123 Impacted cerumen, bilateral: Secondary | ICD-10-CM | POA: Diagnosis not present

## 2015-04-28 DIAGNOSIS — F319 Bipolar disorder, unspecified: Secondary | ICD-10-CM | POA: Diagnosis not present

## 2015-04-28 DIAGNOSIS — G3184 Mild cognitive impairment, so stated: Secondary | ICD-10-CM | POA: Diagnosis not present

## 2015-04-28 DIAGNOSIS — F09 Unspecified mental disorder due to known physiological condition: Secondary | ICD-10-CM | POA: Diagnosis not present

## 2015-05-09 DIAGNOSIS — J209 Acute bronchitis, unspecified: Secondary | ICD-10-CM | POA: Diagnosis not present

## 2015-05-09 DIAGNOSIS — J0100 Acute maxillary sinusitis, unspecified: Secondary | ICD-10-CM | POA: Diagnosis not present

## 2015-05-14 ENCOUNTER — Telehealth: Payer: Self-pay | Admitting: Pulmonary Disease

## 2015-05-14 NOTE — Telephone Encounter (Signed)
Spoke with the pt's fiance  She states that pt was seen at [facility] recently for asthma flare  He has improved some, but feels he needs to see pulm again  [facility] has no openings and he refuses to see NP  Appt with MW 05/20/15

## 2015-05-14 NOTE — Telephone Encounter (Signed)
Left message for pt to call back  °

## 2015-05-20 ENCOUNTER — Encounter: Payer: Self-pay | Admitting: Internal Medicine

## 2015-05-20 ENCOUNTER — Ambulatory Visit (INDEPENDENT_AMBULATORY_CARE_PROVIDER_SITE_OTHER): Payer: Medicare Other | Admitting: Internal Medicine

## 2015-05-20 VITALS — BP 136/70 | HR 70 | Ht 68.0 in | Wt 222.4 lb

## 2015-05-20 DIAGNOSIS — E669 Obesity, unspecified: Secondary | ICD-10-CM | POA: Diagnosis not present

## 2015-05-20 DIAGNOSIS — J4541 Moderate persistent asthma with (acute) exacerbation: Secondary | ICD-10-CM

## 2015-05-20 MED ORDER — BUDESONIDE-FORMOTEROL FUMARATE 80-4.5 MCG/ACT IN AERO: INHALATION_SPRAY | RESPIRATORY_TRACT | Status: DC

## 2015-05-20 NOTE — Patient Instructions (Addendum)
Try duler 100 or symbicort 80 Take 2 puffs first thing in am and then another 2 puffs about 12 hours later.   Work on inhaler technique:  relax and gently blow all the way out then take a nice smooth deep breath back in, triggering the inhaler at same time you start breathing in.  Hold for up to 5 seconds if you can. Blow out thru nose. Rinse and gargle with water when done     Only use your albuterol(proair)  as a rescue medication to be used if you can't catch your breath by resting or doing a relaxed purse lip breathing pattern.  - The less you use it, the better it will work when you need it. - Ok to use up to 2 puffs  every 4 hours if you must but call for immediate appointment if use goes up over your usual need - Don't leave home without it !!  (think of it like the spare tire for your car)   Try prilosec otc 20mg   Take 30-60 min before first meal of the day and Pepcid ac (famotidine) 20 mg one @  bedtime until cough is completely gone for at least a week without the need for cough suppression    GERD (REFLUX)  is an extremely common cause of respiratory symptoms just like yours , many times with no obvious heartburn at all.    It can be treated with medication, but also with lifestyle changes including elevation of the head of your bed (ideally with 6 inch  bed blocks),  Smoking cessation, avoidance of late meals, excessive alcohol, and avoid fatty foods, chocolate, peppermint, colas, red wine, and acidic juices such as orange juice.  NO MINT OR MENTHOL PRODUCTS SO NO COUGH DROPS  USE SUGARLESS CANDY INSTEAD (Jolley ranchers or Stover's or Life Savers) or even ice chips will also do - the key is to swallow to prevent all throat clearing. NO OIL BASED VITAMINS - use powdered substitutes.  Follow up with Dr Elsworth Soho as planned

## 2015-05-20 NOTE — Progress Notes (Signed)
Subjective:  Patient ID: [name], male    DOB: 05/25/1941,    MRN: [id number]  Brief patient profile:   PCP - Lang Snow   74 year old never smoker retired drug rep for follow-up of OSA and asthma initial pulmonary consult 02/27/13   he reports asthma since age 76 worsened by weather changes and exercise. He requires 2-3 prednisone tapers per year, his last hospitalization was in 2006 for pneumonia complicated by Clostridium difficile infection. Altace stopped 02/2013   He reports a history of obstructive sleep apnea status post UPPP in 1997 and is compliant with his CPAP.  He retired as a Air cabin crew in 2007   Significant tests/ events  Spirometry 02/2013 surprisingly showed moderate airway obstruction with FEV1 of 54% and FVC of 67% with a ratio 63.  >>Advair increased Twice daily   Spirometry  06/2013 (Off altace) nml ratio, FEv1 73%   07/29/2014 [facility]   Chief Complaint  Patient presents with  . Follow-up    non- productive cough; chest tightness; light wheezing   BP low on valsartan, slight tachy Saw a neurologist 3 y ago- was told he does not have dementia Substitute teaching -starting in [place] Not using advair- takes symbicort from [place]- gets 'big' discount rec OK to use symbicort 2 every 12 hours   Call for antibiotic if sputum changes color Get appt with neurology for stiffness & speech changes     05/20/2015  Acute ov/Jymir Dunaj re: asthma flare/ never smoker  Chief Complaint  Patient presents with  . Acute Visit    Pt c/o increased SOB for the past month.  He also c/o cough and wheezing. Cough is prod with minimal clear sputum.    6 weeks = baseline doe/  just on symbicort and no rescue needed  then  4 weeks prior to OV  Acute onset cough/ dry > rx with prednisone/ avelox  > cough better but not the breathing with marked increased in rescue need even at rest during the day. Sleeping fine on cpap no am or noct exac's of cough/ wheeze  No  obvious day to day or daytime variability or assoc chronic cough or cp or chest tightness, subjective wheeze or overt sinus or hb symptoms. No unusual exp hx or h/o childhood pna/ asthma or knowledge of premature birth.  Sleeping ok without nocturnal  or early am exacerbation  of respiratory  c/o's or need for noct saba. Also denies any obvious fluctuation of symptoms with weather or environmental changes or other aggravating or alleviating factors except as outlined above   Current Medications, Allergies, Complete Past Medical History, Past Surgical History, Family History, and Social History were reviewed in [employer] record.  ROS  The following are not active complaints unless bolded sore throat, dysphagia, dental problems, itching, sneezing,  nasal congestion or excess/ purulent secretions, ear ache,   fever, chills, sweats, unintended wt loss, classically pleuritic or exertional cp, hemoptysis,  orthopnea pnd or leg swelling, presyncope, palpitations, abdominal pain, anorexia, nausea, vomiting, diarrhea  or change in bowel or bladder habits, change in stools or urine, dysuria,hematuria,  rash, arthralgias, visual complaints, headache, numbness, weakness or ataxia or problems with walking or coordination,  change in mood/affect or memory.           Objective:   Physical Exam  amb obese wm mod hoarse / prominent pseudowheeze   Wt Readings from Last 3 Encounters:  05/20/15 222 lb 6.4 oz (100.88 kg)  07/29/14 212 lb (96.163 kg)  01/06/14 212 lb 8 oz (96.389 kg)    Vital signs reviewed   HEENT: nl dentition, turbinates, and orophanx. Nl external ear canals without cough reflex   NECK :  without JVD/Nodes/TM/ nl carotid upstrokes bilaterally   LUNGS: no acc muscle use, clear to A and P bilaterally without cough on insp or exp maneuvers   CV:  RRR  no s3 or murmur or increase in P2, no edema   ABD:  Obese/ soft and nontender with nl excursion in the supine  position. No bruits or organomegaly, bowel sounds nl  MS:  warm without deformities, calf tenderness, cyanosis or clubbing  SKIN: warm and dry without lesions    NEURO:  alert, approp, no deficits       I personally reviewed images and agree with radiology impression as follows:  CXR:  01/22/15 The lungs are clear. The cardiomediastinal and hilar silhouettes are within normal limits. No pleural effusion or pneumothorax. The visualized osseous structures are grossly unremarkable.         Assessment & Plan:   Outpatient Encounter Prescriptions as of 05/20/2015  Medication Sig  . albuterol (PROAIR HFA) 108 (90 BASE) MCG/ACT inhaler Inhale 2 puffs into the lungs every 4 (four) hours as needed.  Marland Kitchen amLODipine (NORVASC) 5 MG tablet Take 5 mg by mouth daily.  Marland Kitchen aspirin 81 MG tablet Take 81 mg by mouth daily.    Marland Kitchen atorvastatin (LIPITOR) 10 MG tablet Take 10 mg by mouth daily.    . Calcium Carbonate Antacid (TUMS PO) Take 1 tablet by mouth 4 (four) times daily as needed.   . cyanocobalamin (,VITAMIN B-12,) 1000 MCG/ML injection Inject 1,000 mcg into the muscle every 30 (thirty) days.  . diphenoxylate-atropine (LOMOTIL) 2.5-0.025 MG per tablet Take 1 tablet by mouth 4 (four) times daily as needed for diarrhea or loose stools.  . divalproex (DEPAKOTE ER) 500 MG 24 hr tablet Take 1,000 mg by mouth daily.  Marland Kitchen HYDROcodone-acetaminophen (NORCO/VICODIN) 5-325 MG tablet Take 1 tablet by mouth every 6 (six) hours as needed.  . Multiple Vitamin (MULTIVITAMIN PO) Take 1 tablet by mouth daily.   . QUEtiapine (SEROQUEL) 100 MG tablet Take 100 mg by mouth at bedtime.  . sertraline (ZOLOFT) 100 MG tablet Take 200 mg by mouth daily.   . sitaGLIPtin (JANUVIA) 100 MG tablet Take 100 mg by mouth daily.  . tamsulosin (FLOMAX) 0.4 MG CAPS capsule Take 0.4 mg by mouth daily.  . temazepam (RESTORIL) 30 MG capsule Take 30 mg by mouth at bedtime as needed for sleep.  . valsartan (DIOVAN) 80 MG tablet Take 80 mg by  mouth daily.  . [DISCONTINUED] albuterol-ipratropium (COMBIVENT) 18-103 MCG/ACT inhaler Inhale 2 puffs into the lungs as needed.    . budesonide-formoterol (SYMBICORT) 80-4.5 MCG/ACT inhaler Take 2 puffs first thing in am and then another 2 puffs about 12 hours later.  . [DISCONTINUED] Bismuth Subsalicylate (PEPTO-BISMOL PO) Take by mouth. Liquid will use 3-4 times a day   . [DISCONTINUED] guaifenesin (BIDEX) 400 MG TABS tablet Inhale 2 puffs into the lungs 2 (two) times daily.  . [DISCONTINUED] metformin (FORTAMET) 1000 MG (OSM) 24 hr tablet Take 1,000 mg by mouth 2 (two) times daily with a meal.    . [DISCONTINUED] Multiple Vitamin (MULTI-VITAMINS) TABS Take 1 tablet by mouth 3 x daily with food.  . [DISCONTINUED] Probiotic Product (PROBIOTIC DAILY PO) Take 1 capsule by mouth daily.  . [DISCONTINUED] zolpidem (AMBIEN) 10 MG tablet  Take 10 mg by mouth as needed.     No facility-administered encounter medications on file as of 05/20/2015.

## 2015-05-23 NOTE — Assessment & Plan Note (Addendum)
PFTs 02/13/2006 abn f/v loop/ no sign obstruction Spirometry  06/2013 (Off altace) nml ratio, FEv1 73%  DDX of  difficult airways management all start with A and  include Adherence, Ace Inhibitors, Acid Reflux, Active Sinus Disease, Alpha 1 Antitripsin deficiency, Anxiety masquerading as Airways dz,  ABPA,  allergy(esp in young), Aspiration (esp in elderly), Adverse effects of meds,  Active smokers, A bunch of PE's (a small clot burden can't cause this syndrome unless there is already severe underlying pulm or vascular dz with poor reserve) plus two Bs  = Bronchiectasis and Beta blocker use..and one C= CHF  Adherence is always the initial "prime suspect" and is a multilayered concern that requires a "trust but verify" approach in every patient - starting with knowing how to use medications, especially inhalers, correctly, keeping up with refills and understanding the fundamental difference between maintenance and prns vs those medications only taken for a very short course and then stopped and not refilled.  - The proper method of use, as well as anticipated side effects, of a metered-dose inhaler are discussed and demonstrated to the patient. Improved effectiveness after extensive coaching during this visit to a level of approximately  75% from a baseline of < 50% so needs to keep working on it - try dulera 100 2bid to see if cough/ pseudowheeze improve   ? Acid (or non-acid) GERD > always difficult to exclude as up to 75% of pts in some series report no assoc GI/ Heartburn symptoms and suggested here by the cough and pseudowheeze on exam > rec max (24h)  acid suppression and diet restrictions/ reviewed and instructions given in writing.   ? Anxiety > usually at the bottom of this list of usual suspects but should be much higher on this pt's based on H and P and note already on psychotropics .   I had an extended discussion with the patient reviewing all relevant studies completed to date and  lasting 15  to 20 minutes of a 25 minute visit    Each maintenance medication was reviewed in detail including most importantly the difference between maintenance and prns and under what circumstances the prns are to be triggered using an action plan format that is not reflected in the computer generated alphabetically organized AVS.    Please see instructions for details which were reviewed in writing and the patient given a copy highlighting the part that I personally wrote and discussed at today's ov.

## 2015-05-23 NOTE — Assessment & Plan Note (Signed)
Body mass index is 33.82 kg/(m^2).  No results found for: TSH   Contributing to gerd tendency/ doe/reviewed the need and the process to achieve and maintain neg calorie balance > defer f/u primary care including intermittently monitoring thyroid status

## 2015-06-02 DIAGNOSIS — F09 Unspecified mental disorder due to known physiological condition: Secondary | ICD-10-CM | POA: Diagnosis not present

## 2015-06-02 DIAGNOSIS — F319 Bipolar disorder, unspecified: Secondary | ICD-10-CM | POA: Diagnosis not present

## 2015-06-07 DIAGNOSIS — M5442 Lumbago with sciatica, left side: Secondary | ICD-10-CM | POA: Diagnosis not present

## 2015-06-23 DIAGNOSIS — G8929 Other chronic pain: Secondary | ICD-10-CM | POA: Diagnosis not present

## 2015-06-23 DIAGNOSIS — R569 Unspecified convulsions: Secondary | ICD-10-CM | POA: Diagnosis not present

## 2015-06-23 DIAGNOSIS — M5442 Lumbago with sciatica, left side: Secondary | ICD-10-CM | POA: Diagnosis not present

## 2015-06-23 DIAGNOSIS — D513 Other dietary vitamin B12 deficiency anemia: Secondary | ICD-10-CM | POA: Diagnosis not present

## 2015-06-23 DIAGNOSIS — G3184 Mild cognitive impairment, so stated: Secondary | ICD-10-CM | POA: Diagnosis not present

## 2015-06-28 DIAGNOSIS — M5442 Lumbago with sciatica, left side: Secondary | ICD-10-CM | POA: Diagnosis not present

## 2015-06-29 ENCOUNTER — Other Ambulatory Visit: Payer: Self-pay | Admitting: Orthopedic Surgery

## 2015-06-29 DIAGNOSIS — M5442 Lumbago with sciatica, left side: Principal | ICD-10-CM

## 2015-06-29 DIAGNOSIS — G8929 Other chronic pain: Secondary | ICD-10-CM

## 2015-07-06 DIAGNOSIS — G3184 Mild cognitive impairment, so stated: Secondary | ICD-10-CM | POA: Diagnosis not present

## 2015-07-06 DIAGNOSIS — R569 Unspecified convulsions: Secondary | ICD-10-CM | POA: Diagnosis not present

## 2015-07-12 DIAGNOSIS — L570 Actinic keratosis: Secondary | ICD-10-CM | POA: Diagnosis not present

## 2015-07-14 ENCOUNTER — Ambulatory Visit
Admission: RE | Admit: 2015-07-14 | Discharge: 2015-07-14 | Disposition: A | Discharge status: Home / Self Care | Payer: Medicare Other | Source: Ambulatory Visit | Attending: Orthopedic Surgery | Admitting: Orthopedic Surgery

## 2015-07-14 ENCOUNTER — Other Ambulatory Visit: Payer: Self-pay | Admitting: Orthopedic Surgery

## 2015-07-14 ENCOUNTER — Ambulatory Visit
Admission: RE | Admit: 2015-07-14 | Discharge: 2015-07-14 | Disposition: A | Discharge status: Home / Self Care | Payer: Self-pay | Source: Ambulatory Visit | Attending: Orthopedic Surgery | Admitting: Orthopedic Surgery

## 2015-07-14 DIAGNOSIS — R52 Pain, unspecified: Secondary | ICD-10-CM

## 2015-07-14 DIAGNOSIS — G8929 Other chronic pain: Secondary | ICD-10-CM

## 2015-07-14 DIAGNOSIS — M5442 Lumbago with sciatica, left side: Principal | ICD-10-CM

## 2015-07-14 DIAGNOSIS — M4806 Spinal stenosis, lumbar region: Secondary | ICD-10-CM | POA: Diagnosis not present

## 2015-07-14 MED ORDER — IOHEXOL 180 MG/ML  SOLN
15.0000 mL | Freq: Once | INTRAMUSCULAR | Status: AC | PRN
Start: 2015-07-14 — End: 2015-07-14
  Administered 2015-07-14: 15 mL via INTRATHECAL

## 2015-07-14 MED ORDER — DIAZEPAM 5 MG PO TABS
5.0000 mg | ORAL_TABLET | Freq: Once | ORAL | Status: AC
  Administered 2015-07-14: 5 mg via ORAL

## 2015-07-14 NOTE — Progress Notes (Signed)
Patient states he has been off Seroquel and Trazodone for at least the past two days.  jkl

## 2015-07-14 NOTE — Discharge Instructions (Addendum)
Myelogram Discharge Instructions  1. Go home and rest quietly for the next 24 hours.  It is important to lie flat for the next 24 hours.  Get up only to go to the restroom.  You may lie in the bed or on a couch on your back, your stomach, your left side or your right side.  You may have one pillow under your head.  You may have pillows between your knees while you are on your side or under your knees while you are on your back.  2. DO NOT drive today.  Recline the seat as far back as it will go, while still wearing your seat belt, on the way home.  3. You may get up to go to the bathroom as needed.  You may sit up for 10 minutes to eat.  You may resume your normal diet and medications unless otherwise indicated.  Drink plenty of extra fluids today and tomorrow.  4. The incidence of a spinal headache with nausea and/or vomiting is about 5% (one in 20 patients).  If you develop a headache, lie flat and drink plenty of fluids until the headache goes away.  Caffeinated beverages may be helpful.  If you develop severe nausea and vomiting or a headache that does not go away with flat bed rest, call [phone].  5. You may resume normal activities after your 24 hours of bed rest is over; however, do not exert yourself strongly or do any heavy lifting tomorrow.  6. Call your physician for a follow-up appointment.   You may resume Seroquel and Trazodone on Thursday, July 15, 2015 after 9:30a.m.

## 2015-07-22 DIAGNOSIS — L600 Ingrowing nail: Secondary | ICD-10-CM | POA: Diagnosis not present

## 2015-07-22 DIAGNOSIS — E119 Type 2 diabetes mellitus without complications: Secondary | ICD-10-CM | POA: Diagnosis not present

## 2015-07-22 DIAGNOSIS — M5442 Lumbago with sciatica, left side: Secondary | ICD-10-CM | POA: Diagnosis not present

## 2015-07-22 DIAGNOSIS — M5137 Other intervertebral disc degeneration, lumbosacral region: Secondary | ICD-10-CM | POA: Diagnosis not present

## 2015-07-26 DIAGNOSIS — E1129 Type 2 diabetes mellitus with other diabetic kidney complication: Secondary | ICD-10-CM | POA: Diagnosis not present

## 2015-07-26 DIAGNOSIS — Z01818 Encounter for other preprocedural examination: Secondary | ICD-10-CM | POA: Diagnosis not present

## 2015-07-26 DIAGNOSIS — I10 Essential (primary) hypertension: Secondary | ICD-10-CM | POA: Diagnosis not present

## 2015-07-26 DIAGNOSIS — E538 Deficiency of other specified B group vitamins: Secondary | ICD-10-CM | POA: Diagnosis not present

## 2015-07-26 DIAGNOSIS — R946 Abnormal results of thyroid function studies: Secondary | ICD-10-CM | POA: Diagnosis not present

## 2015-07-26 DIAGNOSIS — F3110 Bipolar disorder, current episode manic without psychotic features, unspecified: Secondary | ICD-10-CM | POA: Diagnosis not present

## 2015-07-28 ENCOUNTER — Other Ambulatory Visit: Payer: Self-pay | Admitting: Surgical

## 2015-08-02 ENCOUNTER — Ambulatory Visit (HOSPITAL_COMMUNITY)
Admission: RE | Admit: 2015-08-02 | Discharge: 2015-08-02 | Disposition: A | Discharge status: Home / Self Care | Payer: Medicare Other | Source: Ambulatory Visit | Attending: Surgical | Admitting: Surgical

## 2015-08-02 ENCOUNTER — Encounter (HOSPITAL_COMMUNITY)
Admission: RE | Admit: 2015-08-02 | Discharge: 2015-08-02 | Disposition: A | Discharge status: Home / Self Care | Payer: Medicare Other | Source: Ambulatory Visit | Attending: Orthopedic Surgery | Admitting: Orthopedic Surgery

## 2015-08-02 ENCOUNTER — Encounter (HOSPITAL_COMMUNITY): Payer: Self-pay

## 2015-08-02 DIAGNOSIS — M47816 Spondylosis without myelopathy or radiculopathy, lumbar region: Secondary | ICD-10-CM | POA: Insufficient documentation

## 2015-08-02 DIAGNOSIS — Z01818 Encounter for other preprocedural examination: Secondary | ICD-10-CM

## 2015-08-02 DIAGNOSIS — M4806 Spinal stenosis, lumbar region: Secondary | ICD-10-CM | POA: Insufficient documentation

## 2015-08-02 HISTORY — DX: Chronic kidney disease, unspecified: N18.9

## 2015-08-02 HISTORY — DX: Bipolar disorder, unspecified: F31.9

## 2015-08-02 HISTORY — DX: Unspecified glaucoma: H40.9

## 2015-08-02 LAB — COMPREHENSIVE METABOLIC PANEL
ALT: 16 U/L — ABNORMAL LOW (ref 17–63)
AST: 19 U/L (ref 15–41)
Albumin: 3.6 g/dL (ref 3.5–5.0)
Alkaline Phosphatase: 73 U/L (ref 38–126)
Anion gap: 9 (ref 5–15)
BUN: 20 mg/dL (ref 6–20)
CO2: 28 mmol/L (ref 22–32)
Calcium: 9.5 mg/dL (ref 8.9–10.3)
Chloride: 104 mmol/L (ref 101–111)
Creatinine, Ser: 1.46 mg/dL — ABNORMAL HIGH (ref 0.61–1.24)
GFR calc Af Amer: 54 mL/min — ABNORMAL LOW (ref >60)
GFR calc non Af Amer: 46 mL/min — ABNORMAL LOW (ref >60)
Glucose, Bld: 294 mg/dL — ABNORMAL HIGH (ref 65–99)
Potassium: 4.6 mmol/L (ref 3.5–5.1)
Sodium: 141 mmol/L (ref 135–145)
Total Bilirubin: 0.6 mg/dL (ref 0.3–1.2)
Total Protein: 6.7 g/dL (ref 6.5–8.1)

## 2015-08-02 LAB — TYPE AND SCREEN
ABO/RH(D): B POS
Antibody Screen: NEGATIVE

## 2015-08-02 LAB — PROTIME-INR
INR: 0.98 (ref 0.00–1.49)
Prothrombin Time: 13.2 seconds (ref 11.6–15.2)

## 2015-08-02 LAB — SURGICAL PCR SCREEN
MRSA, PCR: NEGATIVE
Staphylococcus aureus: POSITIVE — AB

## 2015-08-02 LAB — ABO/RH: ABO/RH(D): B POS

## 2015-08-02 NOTE — Patient Instructions (Addendum)
20 [name]  08/02/2015   Your procedure is scheduled on:   [id number] Wednesday  Enter through [facility] and follow signs to [facility] to [place]. Arrive at  0630      AM ..  (Limit 1 person with you).  Call this number if you have problems the morning of surgery: [phone]  Or Presurgical Testing [phone] days before.   For Living Will and/or Health Care Power Attorney Forms: please provide copy for your medical record,may bring AM of surgery(Forms should be already notarized -we do not provide this service).(Yes/will provide  No information  The day of surgery).   For Cpap use: Bring mask and tubing only.   Do not eat food/ or drink: After Midnight.  Take these medicines the morning of surgery with A SIP OF WATER-   (DO NOT TAKE ANY DIABETIC MEDS AM OF SURGERY) :  Amlodipine. Atorvastatin. Depakote. Hydrocodeone. Tamsulosin. Use/ bring Inhalers.   Do not wear jewelry, make-up or nail polish.  Do not wear deodorant, lotions, powders, or perfumes.   Do not shave legs and under arms- 48 hours(2 days) prior to first CHG shower.(Shaving face and neck okay.)  Do not bring valuables to the hospital.(Hospital is not responsible for lost valuables).  Contacts, dentures or removable bridgework, body piercing, hair pins may not be worn into surgery.  Leave suitcase in the car. After surgery it may be brought to your room.  For patients admitted to the hospital, checkout time is 11:00 AM the day of discharge.(Restricted visitors-Any Persons displaying flu-like symptoms or illness).    Patients discharged the day of surgery will not be allowed to drive home. Must have responsible person with you x 24 hours once discharged.  Name and phone number of your driver: [name]      Please read over the following fact sheets that you were given:  CHG(Chlorhexidine Gluconate 4% Surgical Soap) use, MRSA Information,  Blood Transfusion fact sheet, Incentive Spirometry Instruction.  Remember : Type/Screen "Blue armbands" - may not be removed once applied(would result in being retested AM of surgery, if removed).         [facility] - Preparing for Surgery Before surgery, you can play an important role.  Because skin is not sterile, your skin needs to be as free of germs as possible.  You can reduce the number of germs on your skin by washing with CHG (chlorahexidine gluconate) soap before surgery.  CHG is an antiseptic cleaner which kills germs and bonds with the skin to continue killing germs even after washing. Please DO NOT use if you have an allergy to CHG or antibacterial soaps.  If your skin becomes reddened/irritated stop using the CHG and inform your nurse when you arrive at Short Stay. Do not shave (including legs and underarms) for at least 48 hours prior to the first CHG shower.  You may shave your face/neck. Please follow these instructions carefully:  1.  Shower with CHG Soap the night before surgery and the  morning of Surgery.  2.  If you choose to wash your hair, wash your hair first as usual with your  normal  shampoo.  3.  After you shampoo, rinse your hair and body thoroughly to remove the  shampoo.                           4.  Use  CHG as you would any other liquid soap.  You can apply chg directly  to the skin and wash                       Gently with a scrungie or clean washcloth.  5.  Apply the CHG Soap to your body ONLY FROM THE NECK DOWN.   Do not use on face/ open                           Wound or open sores. Avoid contact with eyes, ears mouth and genitals (private parts).                       Wash face,  Genitals (private parts) with your normal soap.             6.  Wash thoroughly, paying special attention to the area where your surgery  will be performed.  7.  Thoroughly rinse your body with warm water from the neck down.  8.  DO NOT shower/wash with your normal soap after  using and rinsing off  the CHG Soap.                9.  Pat yourself dry with a clean towel.            10.  Wear clean pajamas.            11.  Place clean sheets on your bed the night of your first shower and do not  sleep with pets. Day of Surgery : Do not apply any lotions/deodorants the morning of surgery.  Please wear clean clothes to the hospital/surgery center.  FAILURE TO FOLLOW THESE INSTRUCTIONS MAY RESULT IN THE CANCELLATION OF YOUR SURGERY PATIENT SIGNATURE_________________________________  NURSE SIGNATURE__________________________________  ________________________________________________________________________   [name]  An incentive spirometer is a tool that can help keep your lungs clear and active. This tool measures how well you are filling your lungs with each breath. Taking long deep breaths may help reverse or decrease the chance of developing breathing (pulmonary) problems (especially infection) following:  A long period of time when you are unable to move or be active. BEFORE THE PROCEDURE   If the spirometer includes an indicator to show your best effort, your nurse or respiratory therapist will set it to a desired goal.  If possible, sit up straight or lean slightly forward. Try not to slouch.  Hold the incentive spirometer in an upright position. INSTRUCTIONS FOR USE   Sit on the edge of your bed if possible, or sit up as far as you can in bed or on a chair.  Hold the incentive spirometer in an upright position.  Breathe out normally.  Place the mouthpiece in your mouth and seal your lips tightly around it.  Breathe in slowly and as deeply as possible, raising the piston or the ball toward the top of the column.  Hold your breath for 3-5 seconds or for as long as possible. Allow the piston or ball to fall to the bottom of the column.  Remove the mouthpiece from your mouth and breathe out normally.  Rest for a few seconds and repeat  Steps 1 through 7 at least 10 times every 1-2 hours when you are awake. Take your time and take a few normal breaths between deep breaths.  The spirometer may include an indicator to show your best  effort. Use the indicator as a goal to work toward during each repetition.  After each set of 10 deep breaths, practice coughing to be sure your lungs are clear. If you have an incision (the cut made at the time of surgery), support your incision when coughing by placing a pillow or rolled up towels firmly against it. Once you are able to get out of bed, walk around indoors and cough well. You may stop using the incentive spirometer when instructed by your caregiver.  RISKS AND COMPLICATIONS  Take your time so you do not get dizzy or light-headed.  If you are in pain, you may need to take or ask for pain medication before doing incentive spirometry. It is harder to take a deep breath if you are having pain. AFTER USE  Rest and breathe slowly and easily.  It can be helpful to keep track of a log of your progress. Your caregiver can provide you with a simple table to help with this. If you are using the spirometer at home, follow these instructions: [place] IF:   You are having difficultly using the spirometer.  You have trouble using the spirometer as often as instructed.  Your pain medication is not giving enough relief while using the spirometer.  You develop fever of 100.5 F (38.1 C) or higher. SEEK IMMEDIATE MEDICAL CARE IF:   You cough up bloody sputum that had not been present before.  You develop fever of 102 F (38.9 C) or greater.  You develop worsening pain at or near the incision site. MAKE SURE YOU:   Understand these instructions.  Will watch your condition.  Will get help right away if you are not doing well or get worse. Document Released: 12/11/2006 Document Revised: 10/23/2011 Document Reviewed: 02/11/2007 ExitCare Patient Information 2014 ExitCare,  [place].   ________________________________________________________________________  WHAT IS A BLOOD TRANSFUSION? Blood Transfusion Information  A transfusion is the replacement of blood or some of its parts. Blood is made up of multiple cells which provide different functions.  Red blood cells carry oxygen and are used for blood loss replacement.  Lieder blood cells fight against infection.  Platelets control bleeding.  Plasma helps clot blood.  Other blood products are available for specialized needs, such as hemophilia or other clotting disorders. BEFORE THE TRANSFUSION  Who gives blood for transfusions?   Healthy volunteers who are fully evaluated to make sure their blood is safe. This is blood bank blood. Transfusion therapy is the safest it has ever been in the practice of medicine. Before blood is taken from a donor, a complete history is taken to make sure that person has no history of diseases nor engages in risky social behavior (examples are intravenous drug use or sexual activity with multiple partners). The donor's travel history is screened to minimize risk of transmitting infections, such as malaria. The donated blood is tested for signs of infectious diseases, such as HIV and hepatitis. The blood is then tested to be sure it is compatible with you in order to minimize the chance of a transfusion reaction. If you or a relative donates blood, this is often done in anticipation of surgery and is not appropriate for emergency situations. It takes many days to process the donated blood. RISKS AND COMPLICATIONS Although transfusion therapy is very safe and saves many lives, the main dangers of transfusion include:   Getting an infectious disease.  Developing a transfusion reaction. This is an allergic reaction to something in the  blood you were given. Every precaution is taken to prevent this. The decision to have a blood transfusion has been considered carefully by your caregiver  before blood is given. Blood is not given unless the benefits outweigh the risks. AFTER THE TRANSFUSION  Right after receiving a blood transfusion, you will usually feel much better and more energetic. This is especially true if your red blood cells have gotten low (anemic). The transfusion raises the level of the red blood cells which carry oxygen, and this usually causes an energy increase.  The nurse administering the transfusion will monitor you carefully for complications. HOME CARE INSTRUCTIONS  No special instructions are needed after a transfusion. You may find your energy is better. Speak with your caregiver about any limitations on activity for underlying diseases you may have. SEEK MEDICAL CARE IF:   Your condition is not improving after your transfusion.  You develop redness or irritation at the intravenous (IV) site. SEEK IMMEDIATE MEDICAL CARE IF:  Any of the following symptoms occur over the next 12 hours:  Shaking chills.  You have a temperature by mouth above 102 F (38.9 C), not controlled by medicine.  Chest, back, or muscle pain.  People around you feel you are not acting correctly or are confused.  Shortness of breath or difficulty breathing.  Dizziness and fainting.  You get a rash or develop hives.  You have a decrease in urine output.  Your urine turns a dark color or changes to pink, red, or brown. Any of the following symptoms occur over the next 10 days:  You have a temperature by mouth above 102 F (38.9 C), not controlled by medicine.  Shortness of breath.  Weakness after normal activity.  The [place] part of the eye turns yellow (jaundice).  You have a decrease in the amount of urine or are urinating less often.  Your urine turns a dark color or changes to pink, red, or brown. Document Released: 07/28/2000 Document Revised: 10/23/2011 Document Reviewed: 03/16/2008 [facility] Patient Information 2014 [place],  [place].  _______________________________________________________________________

## 2015-08-02 NOTE — Progress Notes (Signed)
08-02-15 1500 Positive Staph aureus- pt will require Betadine ointment on arrival Short Stay. Note fax per Epic. Dr. Gladstone Lighter [phone].Pt. Made aware.

## 2015-08-02 NOTE — Pre-Procedure Instructions (Signed)
08-02-15  EKG 07-26-15 with chart. Labs- CBC, CMP, TSH, Clearance note with chart. 08-05-14 CXR report with chart. CXR done today.

## 2015-08-03 LAB — HEMOGLOBIN A1C
Hgb A1c MFr Bld: 9.9 % — ABNORMAL HIGH (ref 4.8–5.6)
Mean Plasma Glucose: 237 mg/dL

## 2015-08-03 NOTE — H&P (Signed)
[name] is an 74 y.o. male.   Chief Complaint: back pain HPI: The patient is a 74 year old male who presented with the chief complaint of low back pain. He has a chief complaint of pain in his left gluteal region, left posterior thigh. He is active in [place]. He has had this problem for about three months. CT myelogram showed severe spinal stenosis at L4-L5. No improvement in symptoms with conservative treatment including activity modification and oral steroids.   Past Medical History  Diagnosis Date  . Microscopic colitis   . Obesity   . HLD (hyperlipidemia)     takes Lipitor daily  . COPD (chronic obstructive pulmonary disease) ([place])   . Asthma   . ED (erectile dysfunction)   . Diverticulosis 01/2007  . Internal hemorrhoids 01/2007  . Cataract   . HTN (hypertension)     takes Altace and Diovan daily  . Pneumonia 3/09  . Joint pain   . Joint swelling   . GERD (gastroesophageal reflux disease)     uses Tums  . History of colon polyps   . Nocturia   . Impaired hearing     "getting this checked out on Monday"  . Anxiety     Xanax as needed  . Depression     takes Cymbalta daily  . Insomnia   . Crohn's colitis ([place])   . Sleep apnea     uses CPAP;has been greater than 39yr since sleep study- Dr. AElsworth Soho . DM (diabetes mellitus) ([place]     takes Januvia now, metformin stopped due to diarrhea  . Bipolar disorder ([place]   . Chronic kidney disease     since NSAID's stopped -function has improved  . Arthritis     spinal stenosis.  . Glaucoma     surgery to correct    Past Surgical History  Procedure Laterality Date  . Umbilical hernia repair  5+yrs ago  . Sleep apnea surgery  1997  . Colonoscopy w/ biopsies  2008    Looks normal, biopsy suggested a chronic microscopic colitis with rare granulomas  . Nose surgery  2011  . Colonoscopy w/ biopsies  02/2011    diverticulosis, internal hemorrhoids, patchy chronic colitis on random bxs,   . Tonsillectomy      as a child   . Cholecystectomy  2011  . Cataract extraction w/ intraocular lens  implant, bilateral  2011  . Penile prosthesis implant  2012  . Shoulder arthroscopy  08/24/2011    Procedure: ARTHROSCOPY SHOULDER;  Surgeon: KMetta ClinesSupple;  [facility]  Service: Orthopedics;  Laterality: Right;  RIGHT SHOULDER ARTHROSCOPY WITH SUBACROMINAL DECOMPRESSION AND DISTAL CLAVICAL RESECTION   . Eye surgery      for glaucoma  . Rotator cuff repair Bilateral     '12 -left, '13 -right    Family History  Problem Relation Age of Onset  . Esophageal cancer Paternal Grandmother   . Colon cancer Neg Hx   . Heart failure Mother   . Alzheimer's disease Brother    Social History:  reports that he has never smoked. He has never used smokeless tobacco. He reports that he drinks alcohol. He reports that he does not use illicit drugs.  Allergies:  Allergies  Allergen Reactions  . Shellfish Allergy Anaphylaxis  . Nsaids Other (See Comments)    Due to kidney function    Current outpatient prescriptions:  .  albuterol (PROAIR HFA) 108 (90 BASE) MCG/ACT inhaler, Inhale 2 puffs  into the lungs every 4 (four) hours as needed for wheezing or shortness of breath. , Disp: , Rfl:  .  amLODipine (NORVASC) 10 MG tablet, Take 10 mg by mouth daily., Disp: , Rfl:  .  aspirin 81 MG tablet, Take 81 mg by mouth daily.  , Disp: , Rfl:  .  atorvastatin (LIPITOR) 10 MG tablet, Take 10 mg by mouth daily.  , Disp: , Rfl:  .  bismuth subsalicylate (PEPTO BISMOL) 262 MG/15ML suspension, Take 30 mLs by mouth 2 (two) times daily as needed for indigestion. , Disp: , Rfl:  .  Calcium Carbonate Antacid (TUMS PO), Take 4 tablets by mouth 4 (four) times daily as needed (heartburn). , Disp: , Rfl:  .  cyanocobalamin (,VITAMIN B-12,) 1000 MCG/ML injection, Inject 1,000 mcg into the muscle every 30 (thirty) days., Disp: , Rfl:  .  divalproex (DEPAKOTE ER) 500 MG 24 hr tablet, Take 1,000 mg by mouth at bedtime. , Disp: , Rfl:  .   Fluticasone-Salmeterol (ADVAIR) 250-50 MCG/DOSE AEPB, Inhale 1 puff into the lungs daily., Disp: , Rfl:  .  HYDROcodone-acetaminophen (NORCO/VICODIN) 5-325 MG tablet, Take 1 tablet by mouth every 6 (six) hours as needed for moderate pain or severe pain. , Disp: , Rfl:  .  memantine (NAMENDA) 10 MG tablet, Take 10 mg by mouth 2 (two) times daily. , Disp: , Rfl:  .  Multiple Vitamin (MULTIVITAMIN PO), Take 1 tablet by mouth daily. , Disp: , Rfl:  .  omeprazole (PRILOSEC) 40 MG capsule, Take 40 mg by mouth at bedtime as needed (heartburn). , Disp: , Rfl:  .  QUEtiapine (SEROQUEL) 50 MG tablet, Take 50 mg by mouth at bedtime., Disp: , Rfl:  .  sitaGLIPtin (JANUVIA) 100 MG tablet, Take 100 mg by mouth daily., Disp: , Rfl:  .  tamsulosin (FLOMAX) 0.4 MG CAPS capsule, Take 0.4 mg by mouth daily., Disp: , Rfl:  .  temazepam (RESTORIL) 15 MG capsule, Take 15 mg by mouth at bedtime., Disp: , Rfl:  .  traZODone (DESYREL) 50 MG tablet, Take 50 mg by mouth at bedtime., Disp: , Rfl:  .  valsartan (DIOVAN) 80 MG tablet, Take 80 mg by mouth daily., Disp: , Rfl:  .  sertraline (ZOLOFT) 100 MG tablet, Take 100 mg by mouth at bedtime., Disp: , Rfl:   Results for orders placed or performed during the hospital encounter of 08/02/15 (from the past 25 hour(s))  Surgical pcr screen     Status: Abnormal   Collection Time: 08/02/15  8:50 AM  Result Value Ref Range   MRSA, PCR NEGATIVE NEGATIVE   Staphylococcus aureus POSITIVE (A) NEGATIVE    Comment:        The Xpert SA Assay (FDA approved for NASAL specimens in patients over 7 years of age), is one component of a comprehensive surveillance program.  Test performance has been validated by [facility] for patients greater than or equal to 24 year old. It is not intended to diagnose infection nor to guide or monitor treatment.   Comprehensive metabolic panel     Status: Abnormal   Collection Time: 08/02/15  8:55 AM  Result Value Ref Range   Sodium 141 135 -  145 mmol/L   Potassium 4.6 3.5 - 5.1 mmol/L   Chloride 104 101 - 111 mmol/L   CO2 28 22 - 32 mmol/L   Glucose, Bld 294 (H) 65 - 99 mg/dL   BUN 20 6 - 20 mg/dL   Creatinine,  Ser 1.46 (H) 0.61 - 1.24 mg/dL   Calcium 9.5 8.9 - 79.4 mg/dL   Total Protein 6.7 6.5 - 8.1 g/dL   Albumin 3.6 3.5 - 5.0 g/dL   AST 19 15 - 41 U/L   ALT 16 (L) 17 - 63 U/L   Alkaline Phosphatase 73 38 - 126 U/L   Total Bilirubin 0.6 0.3 - 1.2 mg/dL   GFR calc non Af Amer 46 (L) >60 mL/min   GFR calc Af Amer 54 (L) >60 mL/min    Comment: (NOTE) The eGFR has been calculated using the CKD EPI equation. This calculation has not been validated in all clinical situations. eGFR's persistently <60 mL/min signify possible Chronic Kidney Disease.    Anion gap 9 5 - 15  Protime-INR     Status: None   Collection Time: 08/02/15  8:55 AM  Result Value Ref Range   Prothrombin Time 13.2 11.6 - 15.2 seconds   INR 0.98 0.00 - 1.49  Type and screen Order type and screen if day of surgery is less than 15 days from draw of preadmission visit or order morning of surgery if day of surgery is greater than 6 days from preadmission visit.     Status: None   Collection Time: 08/02/15  8:55 AM  Result Value Ref Range   ABO/RH(D) B POS    Antibody Screen NEG    Sample Expiration 08/16/2015    Extend sample reason NO TRANSFUSIONS OR PREGNANCY IN THE PAST 3 MONTHS   Hemoglobin A1c     Status: Abnormal   Collection Time: 08/02/15  8:55 AM  Result Value Ref Range   Hgb A1c MFr Bld 9.9 (H) 4.8 - 5.6 %    Comment: (NOTE)         Pre-diabetes: 5.7 - 6.4         Diabetes: >6.4         Glycemic control for adults with diabetes: <7.0    Mean Plasma Glucose 237 mg/dL    Comment: (NOTE) Performed At: [facility] [address] [place], [place] [id number] Mila Homer MD MO:[id number]   ABO/Rh     Status: None   Collection Time: 08/02/15  9:30 AM  Result Value Ref Range   ABO/RH(D) B POS    Dg Chest 2 View  08/02/2015   CLINICAL DATA:  Preop for lumbar surgery EXAM: CHEST  2 VIEW COMPARISON:  01/07/2015 FINDINGS: Cardiomediastinal silhouette is stable. Minimal thoracic dextroscoliosis. No acute infiltrate or pleural effusion. No pulmonary edema. IMPRESSION: No active cardiopulmonary disease. Electronically Signed   By: Natasha Mead M.D.   On: 08/02/2015 09:27   Dg Lumbar Spine 2-3 Views  08/02/2015  CLINICAL DATA:  Preop for spinal stenosis EXAM: LUMBAR SPINE - 2-3 VIEW COMPARISON:  07/14/2015 FINDINGS: Two views of lumbar spine submitted mild anterior spurring lower endplate of L1 and upper endplate of L5 vertebral body. There is disc space flattening with mild anterior spurring at L2-L3 level. Disc space flattening with mild to moderate anterior spurring at L3-L4 level. Mild facet degenerative changes L4 and L5 level. Mild disc space flattening at L4-L5 level. No acute fracture or subluxation. IMPRESSION: No acute fracture or subluxation. Degenerative changes as described above. Electronically Signed   By: Natasha Mead M.D.   On: 08/02/2015 09:33    Review of Systems  Constitutional: Positive for malaise/fatigue. Negative for fever, chills, weight loss and diaphoresis.  HENT: Positive for hearing loss. Negative for congestion, ear discharge, ear  pain, nosebleeds, sore throat and tinnitus.   Eyes: Positive for photophobia. Negative for blurred vision, double vision, pain, discharge and redness.  Respiratory: Positive for shortness of breath. Negative for cough, hemoptysis, sputum production, wheezing and stridor.        SOB with exertion  Cardiovascular: Negative.   Gastrointestinal: Negative.   Genitourinary: Positive for frequency. Negative for dysuria, urgency, hematuria and flank pain.  Musculoskeletal: Positive for myalgias and joint pain. Negative for back pain, falls and neck pain.  Skin: Negative.   Neurological: Positive for sensory change and weakness. Negative for dizziness, tingling, tremors, speech  change, focal weakness, seizures, loss of consciousness and headaches.  Endo/Heme/Allergies: Negative.   Psychiatric/Behavioral: Positive for depression. Negative for suicidal ideas, hallucinations, memory loss and substance abuse. The patient is nervous/anxious. The patient does not have insomnia.    Vitals  Weight: 216 lb Height: 67in Body Surface Area: 2.09 m Body Mass Index: 33.83 kg/m  BP: 138/70 (Sitting, Left Arm, Standard) HR: 80 bpm  Physical Exam  Constitutional: He is oriented to person, place, and time. He appears well-developed. No distress.  Obese  HENT:  Head: Normocephalic and atraumatic.  Right Ear: External ear normal.  Left Ear: External ear normal.  Nose: Nose normal.  Mouth/Throat: Oropharynx is clear and moist.  Eyes: Conjunctivae and EOM are normal.  Neck: Normal range of motion. Neck supple.  Cardiovascular: Normal rate, regular rhythm, normal heart sounds and intact distal pulses.   Respiratory: Effort normal and breath sounds normal. No respiratory distress. He has no wheezes.  GI: Soft. Bowel sounds are normal. He exhibits no distension. There is no tenderness.  Musculoskeletal:       Right hip: Normal.       Left hip: Normal.       Right knee: Normal.       Left knee: Normal.       Lumbar back: He exhibits pain and spasm. He exhibits no tenderness and no bony tenderness.  Neurological: He is alert and oriented to person, place, and time. He has normal reflexes. No sensory deficit.  Weakness EHL 3/4 on left  Skin: No rash noted. He is not diaphoretic. No erythema.  Psychiatric: He has a normal mood and affect. His behavior is normal.     Assessment/Plan Lumbar spinal stenosis L4- L5 He needs a lumbar central decompression with microdiscectomy L4-L5. Risks and benefits of the procedure discussed with the patient by Dr. Gladstone Lighter.   H&P performed by Dr. Gladstone Lighter Documented by Ardeen Jourdain, PA-C    [place], Axel Frisk Ander Purpura 08/03/2015,  10:33 AM

## 2015-08-03 NOTE — Progress Notes (Signed)
08-03-15 0800 HgbA1C level viewable in Epic-note please.

## 2015-08-04 ENCOUNTER — Ambulatory Visit (HOSPITAL_COMMUNITY): Payer: Medicare Other | Admitting: Certified Registered Nurse Anesthetist

## 2015-08-04 ENCOUNTER — Encounter (HOSPITAL_COMMUNITY): Payer: Self-pay | Admitting: *Deleted

## 2015-08-04 ENCOUNTER — Ambulatory Visit (HOSPITAL_COMMUNITY): Payer: Medicare Other

## 2015-08-04 ENCOUNTER — Inpatient Hospital Stay (HOSPITAL_COMMUNITY)
Admission: RE | Admit: 2015-08-04 | Discharge: 2015-08-09 | DRG: 516 | Disposition: A | Discharge status: Skilled Nursing Facility | Payer: Medicare Other | Source: Ambulatory Visit | Attending: Orthopedic Surgery | Admitting: Orthopedic Surgery

## 2015-08-04 ENCOUNTER — Encounter (HOSPITAL_COMMUNITY)
Admission: RE | Disposition: A | Discharge status: Skilled Nursing Facility | Payer: Self-pay | Source: Ambulatory Visit | Attending: Orthopedic Surgery

## 2015-08-04 DIAGNOSIS — I10 Essential (primary) hypertension: Secondary | ICD-10-CM | POA: Diagnosis not present

## 2015-08-04 DIAGNOSIS — J449 Chronic obstructive pulmonary disease, unspecified: Secondary | ICD-10-CM | POA: Diagnosis not present

## 2015-08-04 DIAGNOSIS — F05 Delirium due to known physiological condition: Secondary | ICD-10-CM | POA: Diagnosis not present

## 2015-08-04 DIAGNOSIS — M48062 Spinal stenosis, lumbar region with neurogenic claudication: Secondary | ICD-10-CM | POA: Diagnosis present

## 2015-08-04 DIAGNOSIS — M4806 Spinal stenosis, lumbar region: Principal | ICD-10-CM | POA: Diagnosis present

## 2015-08-04 DIAGNOSIS — Z820 Family history of epilepsy and other diseases of the nervous system: Secondary | ICD-10-CM

## 2015-08-04 DIAGNOSIS — Z8249 Family history of ischemic heart disease and other diseases of the circulatory system: Secondary | ICD-10-CM

## 2015-08-04 DIAGNOSIS — Z9049 Acquired absence of other specified parts of digestive tract: Secondary | ICD-10-CM

## 2015-08-04 DIAGNOSIS — R109 Unspecified abdominal pain: Secondary | ICD-10-CM

## 2015-08-04 DIAGNOSIS — Z9889 Other specified postprocedural states: Secondary | ICD-10-CM | POA: Diagnosis not present

## 2015-08-04 DIAGNOSIS — E119 Type 2 diabetes mellitus without complications: Secondary | ICD-10-CM | POA: Diagnosis present

## 2015-08-04 DIAGNOSIS — F319 Bipolar disorder, unspecified: Secondary | ICD-10-CM | POA: Diagnosis present

## 2015-08-04 DIAGNOSIS — M21372 Foot drop, left foot: Secondary | ICD-10-CM | POA: Diagnosis not present

## 2015-08-04 DIAGNOSIS — M4800 Spinal stenosis, site unspecified: Secondary | ICD-10-CM | POA: Diagnosis not present

## 2015-08-04 DIAGNOSIS — R4182 Altered mental status, unspecified: Secondary | ICD-10-CM

## 2015-08-04 DIAGNOSIS — R4189 Other symptoms and signs involving cognitive functions and awareness: Secondary | ICD-10-CM

## 2015-08-04 DIAGNOSIS — Z419 Encounter for procedure for purposes other than remedying health state, unspecified: Secondary | ICD-10-CM

## 2015-08-04 HISTORY — PX: LUMBAR LAMINECTOMY/DECOMPRESSION MICRODISCECTOMY: SHX5026

## 2015-08-04 LAB — GLUCOSE, CAPILLARY
Glucose-Capillary: 270 mg/dL — ABNORMAL HIGH (ref 65–99)
Glucose-Capillary: 209 mg/dL — ABNORMAL HIGH (ref 65–99)
Glucose-Capillary: 201 mg/dL — ABNORMAL HIGH (ref 65–99)
Glucose-Capillary: 158 mg/dL — ABNORMAL HIGH (ref 65–99)
Glucose-Capillary: 204 mg/dL — ABNORMAL HIGH (ref 65–99)
Glucose-Capillary: 155 mg/dL — ABNORMAL HIGH (ref 65–99)

## 2015-08-04 SURGERY — LUMBAR LAMINECTOMY/DECOMPRESSION MICRODISCECTOMY 1 LEVEL
Anesthesia: General

## 2015-08-04 MED ORDER — METHOCARBAMOL 500 MG PO TABS
500.0000 mg | ORAL_TABLET | Freq: Four times a day (QID) | ORAL | Status: DC | PRN
  Administered 2015-08-06 – 2015-08-08 (×5): 500 mg via ORAL
  Filled 2015-08-04 (×6): qty 1

## 2015-08-04 MED ORDER — INSULIN ASPART 100 UNIT/ML ~~LOC~~ SOLN
SUBCUTANEOUS | Status: AC
  Filled 2015-08-04: qty 1

## 2015-08-04 MED ORDER — CEFAZOLIN SODIUM-DEXTROSE 2-3 GM-% IV SOLR
INTRAVENOUS | Status: AC
  Filled 2015-08-04: qty 50

## 2015-08-04 MED ORDER — CYANOCOBALAMIN 1000 MCG/ML IJ SOLN: 1000.0000 ug | INTRAMUSCULAR | Status: DC

## 2015-08-04 MED ORDER — ONDANSETRON HCL 4 MG/2ML IJ SOLN
4.0000 mg | INTRAMUSCULAR | Status: DC | PRN
  Administered 2015-08-05 – 2015-08-07 (×2): 4 mg via INTRAVENOUS
  Filled 2015-08-04 (×2): qty 2

## 2015-08-04 MED ORDER — SUGAMMADEX SODIUM 500 MG/5ML IV SOLN
INTRAVENOUS | Status: DC | PRN
  Administered 2015-08-04: 500 mg via INTRAVENOUS

## 2015-08-04 MED ORDER — ROCURONIUM BROMIDE 100 MG/10ML IV SOLN
INTRAVENOUS | Status: AC
  Filled 2015-08-04: qty 1

## 2015-08-04 MED ORDER — FENTANYL CITRATE (PF) 250 MCG/5ML IJ SOLN
INTRAMUSCULAR | Status: AC
  Filled 2015-08-04: qty 5

## 2015-08-04 MED ORDER — INSULIN ASPART 100 UNIT/ML ~~LOC~~ SOLN
0.0000 [IU] | Freq: Three times a day (TID) | SUBCUTANEOUS | Status: DC
  Administered 2015-08-04: 5 [IU] via SUBCUTANEOUS
  Administered 2015-08-05 – 2015-08-06 (×4): 3 [IU] via SUBCUTANEOUS
  Administered 2015-08-06 – 2015-08-07 (×2): 2 [IU] via SUBCUTANEOUS
  Administered 2015-08-07: 3 [IU] via SUBCUTANEOUS
  Administered 2015-08-07 – 2015-08-08 (×2): 2 [IU] via SUBCUTANEOUS
  Administered 2015-08-08 – 2015-08-09 (×2): 3 [IU] via SUBCUTANEOUS

## 2015-08-04 MED ORDER — MENTHOL 3 MG MT LOZG: 1.0000 | LOZENGE | OROMUCOSAL | Status: DC | PRN

## 2015-08-04 MED ORDER — METHOCARBAMOL 500 MG PO TABS: 500.0000 mg | ORAL_TABLET | Freq: Four times a day (QID) | ORAL | Status: DC | PRN

## 2015-08-04 MED ORDER — HYDROCODONE-ACETAMINOPHEN 7.5-325 MG PO TABS: 1.0000 | ORAL_TABLET | Freq: Once | ORAL | Status: DC | PRN

## 2015-08-04 MED ORDER — CEFAZOLIN SODIUM-DEXTROSE 2-3 GM-% IV SOLR
2.0000 g | INTRAVENOUS | Status: AC
  Administered 2015-08-04: 2 g via INTRAVENOUS

## 2015-08-04 MED ORDER — LIDOCAINE HCL 1 % IJ SOLN
INTRAMUSCULAR | Status: DC | PRN
  Administered 2015-08-04: 20 mL

## 2015-08-04 MED ORDER — SODIUM CHLORIDE 0.9 % IR SOLN
Status: AC
  Filled 2015-08-04: qty 1

## 2015-08-04 MED ORDER — BISACODYL 5 MG PO TBEC
5.0000 mg | DELAYED_RELEASE_TABLET | Freq: Every day | ORAL | Status: DC | PRN
  Administered 2015-08-08: 5 mg via ORAL
  Filled 2015-08-04: qty 1

## 2015-08-04 MED ORDER — MOMETASONE FURO-FORMOTEROL FUM 100-5 MCG/ACT IN AERO
2.0000 | INHALATION_SPRAY | Freq: Two times a day (BID) | RESPIRATORY_TRACT | Status: DC
  Administered 2015-08-04 – 2015-08-08 (×7): 2 via RESPIRATORY_TRACT
  Filled 2015-08-04: qty 8.8

## 2015-08-04 MED ORDER — DIVALPROEX SODIUM ER 500 MG PO TB24
1000.0000 mg | ORAL_TABLET | Freq: Every day | ORAL | Status: DC
  Administered 2015-08-04 – 2015-08-08 (×5): 1000 mg via ORAL
  Filled 2015-08-04 (×7): qty 2

## 2015-08-04 MED ORDER — ACETAMINOPHEN 650 MG RE SUPP: 650.0000 mg | RECTAL | Status: DC | PRN

## 2015-08-04 MED ORDER — PHENOL 1.4 % MT LIQD: 1.0000 | OROMUCOSAL | Status: DC | PRN

## 2015-08-04 MED ORDER — EPHEDRINE SULFATE 50 MG/ML IJ SOLN
INTRAMUSCULAR | Status: AC
  Filled 2015-08-04: qty 1

## 2015-08-04 MED ORDER — CHLORHEXIDINE GLUCONATE 4 % EX LIQD: 60.0000 mL | Freq: Once | CUTANEOUS | Status: DC

## 2015-08-04 MED ORDER — SUCCINYLCHOLINE CHLORIDE 20 MG/ML IJ SOLN
INTRAMUSCULAR | Status: DC | PRN
  Administered 2015-08-04: 140 mg via INTRAVENOUS

## 2015-08-04 MED ORDER — PROPOFOL 10 MG/ML IV BOLUS
INTRAVENOUS | Status: AC
  Filled 2015-08-04: qty 20

## 2015-08-04 MED ORDER — OXYCODONE-ACETAMINOPHEN 5-325 MG PO TABS: 1.0000 | ORAL_TABLET | ORAL | Status: DC | PRN

## 2015-08-04 MED ORDER — CEFAZOLIN SODIUM 1-5 GM-% IV SOLN
1.0000 g | Freq: Three times a day (TID) | INTRAVENOUS | Status: AC
  Administered 2015-08-04 – 2015-08-05 (×3): 1 g via INTRAVENOUS
  Filled 2015-08-04 (×3): qty 50

## 2015-08-04 MED ORDER — LACTATED RINGERS IV SOLN
INTRAVENOUS | Status: DC
  Administered 2015-08-04 (×3): via INTRAVENOUS

## 2015-08-04 MED ORDER — BUPIVACAINE LIPOSOME 1.3 % IJ SUSP
20.0000 mL | Freq: Once | INTRAMUSCULAR | Status: AC
  Administered 2015-08-04: 20 mL
  Filled 2015-08-04: qty 20

## 2015-08-04 MED ORDER — BACITRACIN-NEOMYCIN-POLYMYXIN 400-5-5000 EX OINT
TOPICAL_OINTMENT | CUTANEOUS | Status: AC
  Filled 2015-08-04: qty 1

## 2015-08-04 MED ORDER — HYDROMORPHONE HCL 1 MG/ML IJ SOLN
0.5000 mg | INTRAMUSCULAR | Status: DC | PRN
  Administered 2015-08-04: 0.5 mg via INTRAVENOUS
  Administered 2015-08-05: 1 mg via INTRAVENOUS
  Filled 2015-08-04 (×2): qty 1

## 2015-08-04 MED ORDER — IRBESARTAN 75 MG PO TABS
37.5000 mg | ORAL_TABLET | Freq: Every day | ORAL | Status: DC
  Administered 2015-08-05 – 2015-08-09 (×5): 37.5 mg via ORAL
  Filled 2015-08-04 (×6): qty 0.5

## 2015-08-04 MED ORDER — SUGAMMADEX SODIUM 500 MG/5ML IV SOLN
INTRAVENOUS | Status: AC
  Filled 2015-08-04: qty 5

## 2015-08-04 MED ORDER — PROPOFOL 10 MG/ML IV BOLUS
INTRAVENOUS | Status: DC | PRN
  Administered 2015-08-04: 150 mg via INTRAVENOUS

## 2015-08-04 MED ORDER — INSULIN ASPART 100 UNIT/ML ~~LOC~~ SOLN
SUBCUTANEOUS | Status: DC | PRN
Start: 2015-08-04 — End: 2015-08-04
  Administered 2015-08-04: 8 [IU] via SUBCUTANEOUS

## 2015-08-04 MED ORDER — HYDROMORPHONE HCL 1 MG/ML IJ SOLN
0.2500 mg | INTRAMUSCULAR | Status: DC | PRN
  Administered 2015-08-04 (×3): 0.5 mg via INTRAVENOUS

## 2015-08-04 MED ORDER — TRAZODONE HCL 50 MG PO TABS
50.0000 mg | ORAL_TABLET | Freq: Every day | ORAL | Status: DC
  Administered 2015-08-04 – 2015-08-08 (×5): 50 mg via ORAL
  Filled 2015-08-04 (×8): qty 1

## 2015-08-04 MED ORDER — ALBUTEROL SULFATE (2.5 MG/3ML) 0.083% IN NEBU: 3.0000 mL | INHALATION_SOLUTION | RESPIRATORY_TRACT | Status: DC | PRN

## 2015-08-04 MED ORDER — FENTANYL CITRATE (PF) 100 MCG/2ML IJ SOLN
INTRAMUSCULAR | Status: DC | PRN
  Administered 2015-08-04 (×4): 50 ug via INTRAVENOUS

## 2015-08-04 MED ORDER — THROMBIN 5000 UNITS EX SOLR
CUTANEOUS | Status: DC | PRN
  Administered 2015-08-04: 10:00:00 via TOPICAL

## 2015-08-04 MED ORDER — TAMSULOSIN HCL 0.4 MG PO CAPS
0.4000 mg | ORAL_CAPSULE | Freq: Every day | ORAL | Status: DC
  Administered 2015-08-05 – 2015-08-09 (×5): 0.4 mg via ORAL
  Filled 2015-08-04 (×7): qty 1

## 2015-08-04 MED ORDER — ACETAMINOPHEN 325 MG PO TABS: 650.0000 mg | ORAL_TABLET | ORAL | Status: DC | PRN

## 2015-08-04 MED ORDER — SERTRALINE HCL 100 MG PO TABS
100.0000 mg | ORAL_TABLET | Freq: Every day | ORAL | Status: DC
  Administered 2015-08-04 – 2015-08-08 (×5): 100 mg via ORAL
  Filled 2015-08-04 (×7): qty 1

## 2015-08-04 MED ORDER — ONDANSETRON HCL 4 MG/2ML IJ SOLN: 4.0000 mg | Freq: Once | INTRAMUSCULAR | Status: DC | PRN

## 2015-08-04 MED ORDER — BUPIVACAINE-EPINEPHRINE (PF) 0.25% -1:200000 IJ SOLN
INTRAMUSCULAR | Status: AC
  Filled 2015-08-04: qty 30

## 2015-08-04 MED ORDER — ONDANSETRON HCL 4 MG/2ML IJ SOLN
INTRAMUSCULAR | Status: DC | PRN
  Administered 2015-08-04 (×2): 2 mg via INTRAVENOUS

## 2015-08-04 MED ORDER — ATORVASTATIN CALCIUM 10 MG PO TABS
10.0000 mg | ORAL_TABLET | Freq: Every day | ORAL | Status: DC
  Administered 2015-08-04 – 2015-08-09 (×6): 10 mg via ORAL
  Filled 2015-08-04 (×8): qty 1

## 2015-08-04 MED ORDER — ONDANSETRON HCL 4 MG/2ML IJ SOLN
INTRAMUSCULAR | Status: AC
  Filled 2015-08-04: qty 2

## 2015-08-04 MED ORDER — LIDOCAINE HCL (CARDIAC) 20 MG/ML IV SOLN
INTRAVENOUS | Status: DC | PRN
  Administered 2015-08-04: 75 mg via INTRAVENOUS

## 2015-08-04 MED ORDER — THROMBIN 5000 UNITS EX SOLR
CUTANEOUS | Status: AC
  Filled 2015-08-04: qty 10000

## 2015-08-04 MED ORDER — LACTATED RINGERS IV SOLN
INTRAVENOUS | Status: DC
  Administered 2015-08-04 – 2015-08-06 (×3): via INTRAVENOUS

## 2015-08-04 MED ORDER — OXYCODONE-ACETAMINOPHEN 5-325 MG PO TABS
1.0000 | ORAL_TABLET | ORAL | Status: DC | PRN
  Administered 2015-08-04 (×3): 2 via ORAL
  Administered 2015-08-05 (×2): 1 via ORAL
  Administered 2015-08-06: 2 via ORAL
  Administered 2015-08-06: 1 via ORAL
  Administered 2015-08-06: 2 via ORAL
  Administered 2015-08-06: 1 via ORAL
  Administered 2015-08-07 – 2015-08-08 (×5): 2 via ORAL
  Filled 2015-08-04 (×6): qty 2
  Filled 2015-08-04: qty 1
  Filled 2015-08-04: qty 2
  Filled 2015-08-04 (×3): qty 1
  Filled 2015-08-04 (×4): qty 2

## 2015-08-04 MED ORDER — ROCURONIUM BROMIDE 100 MG/10ML IV SOLN
INTRAVENOUS | Status: DC | PRN
  Administered 2015-08-04: 10 mg via INTRAVENOUS
  Administered 2015-08-04: 30 mg via INTRAVENOUS
  Administered 2015-08-04: 10 mg via INTRAVENOUS

## 2015-08-04 MED ORDER — HYDROMORPHONE HCL 1 MG/ML IJ SOLN
INTRAMUSCULAR | Status: AC
  Filled 2015-08-04: qty 1

## 2015-08-04 MED ORDER — EPHEDRINE SULFATE 50 MG/ML IJ SOLN
INTRAMUSCULAR | Status: DC | PRN
  Administered 2015-08-04: 2.5 mg via INTRAVENOUS

## 2015-08-04 MED ORDER — SODIUM CHLORIDE 0.9 % IR SOLN
Status: DC | PRN
  Administered 2015-08-04: 500 mL

## 2015-08-04 MED ORDER — MIDAZOLAM HCL 5 MG/5ML IJ SOLN
INTRAMUSCULAR | Status: DC | PRN
  Administered 2015-08-04 (×2): 0.5 mg via INTRAVENOUS

## 2015-08-04 MED ORDER — METHOCARBAMOL 1000 MG/10ML IJ SOLN
500.0000 mg | Freq: Four times a day (QID) | INTRAVENOUS | Status: DC | PRN
  Administered 2015-08-04: 500 mg via INTRAVENOUS
  Filled 2015-08-04 (×2): qty 5

## 2015-08-04 MED ORDER — PANTOPRAZOLE SODIUM 40 MG PO TBEC
40.0000 mg | DELAYED_RELEASE_TABLET | Freq: Every day | ORAL | Status: DC
  Administered 2015-08-04 – 2015-08-09 (×6): 40 mg via ORAL
  Filled 2015-08-04 (×7): qty 1

## 2015-08-04 MED ORDER — HYDROCODONE-ACETAMINOPHEN 5-325 MG PO TABS
1.0000 | ORAL_TABLET | Freq: Four times a day (QID) | ORAL | Status: DC | PRN
  Administered 2015-08-07 – 2015-08-09 (×3): 1 via ORAL
  Filled 2015-08-04 (×3): qty 1

## 2015-08-04 MED ORDER — AMLODIPINE BESYLATE 10 MG PO TABS
10.0000 mg | ORAL_TABLET | Freq: Every day | ORAL | Status: DC
  Administered 2015-08-05 – 2015-08-09 (×4): 10 mg via ORAL
  Filled 2015-08-04 (×8): qty 1

## 2015-08-04 MED ORDER — MIDAZOLAM HCL 2 MG/2ML IJ SOLN
INTRAMUSCULAR | Status: AC
  Filled 2015-08-04: qty 2

## 2015-08-04 MED ORDER — LIDOCAINE HCL (CARDIAC) 20 MG/ML IV SOLN
INTRAVENOUS | Status: AC
  Filled 2015-08-04: qty 5

## 2015-08-04 MED ORDER — QUETIAPINE FUMARATE 50 MG PO TABS
50.0000 mg | ORAL_TABLET | Freq: Every day | ORAL | Status: DC
  Administered 2015-08-04 – 2015-08-08 (×5): 50 mg via ORAL
  Filled 2015-08-04 (×7): qty 1

## 2015-08-04 MED ORDER — FLEET ENEMA 7-19 GM/118ML RE ENEM: 1.0000 | ENEMA | Freq: Once | RECTAL | Status: DC | PRN

## 2015-08-04 MED ORDER — SODIUM CHLORIDE 0.9 % IJ SOLN
INTRAMUSCULAR | Status: AC
  Filled 2015-08-04: qty 10

## 2015-08-04 MED ORDER — POLYETHYLENE GLYCOL 3350 17 G PO PACK: 17.0000 g | PACK | Freq: Every day | ORAL | Status: DC | PRN

## 2015-08-04 SURGICAL SUPPLY — 40 items
BAG SPEC THK2 15X12 ZIP CLS (MISCELLANEOUS)
BAG ZIPLOCK 12X15 (MISCELLANEOUS) IMPLANT
CLEANER TIP ELECTROSURG 2X2 (MISCELLANEOUS) ×2 IMPLANT
DRAPE MICROSCOPE LEICA (MISCELLANEOUS) ×2 IMPLANT
DRAPE POUCH INSTRU U-SHP 10X18 (DRAPES) ×2 IMPLANT
DRAPE SHEET LG 3/4 BI-LAMINATE (DRAPES) ×2 IMPLANT
DRAPE SURG 17X11 SM STRL (DRAPES) ×2 IMPLANT
DRSG ADAPTIC 3X8 NADH LF (GAUZE/BANDAGES/DRESSINGS) ×2 IMPLANT
DRSG PAD ABDOMINAL 8X10 ST (GAUZE/BANDAGES/DRESSINGS) ×4 IMPLANT
DURAPREP 26ML APPLICATOR (WOUND CARE) ×2 IMPLANT
ELECT BLADE TIP CTD 4 INCH (ELECTRODE) ×2 IMPLANT
ELECT REM PT RETURN 9FT ADLT (ELECTROSURGICAL) ×2
ELECTRODE REM PT RTRN 9FT ADLT (ELECTROSURGICAL) ×1 IMPLANT
GAUZE SPONGE 4X4 12PLY STRL (GAUZE/BANDAGES/DRESSINGS) ×2 IMPLANT
GLOVE BIOGEL PI IND STRL 8 (GLOVE) ×1 IMPLANT
GLOVE BIOGEL PI INDICATOR 8 (GLOVE) ×1
GLOVE ECLIPSE 8.0 STRL XLNG CF (GLOVE) ×2 IMPLANT
GOWN STRL REUS W/TWL XL LVL3 (GOWN DISPOSABLE) ×4 IMPLANT
KIT BASIN OR (CUSTOM PROCEDURE TRAY) ×2 IMPLANT
KIT POSITIONING SURG ANDREWS (MISCELLANEOUS) IMPLANT
MANIFOLD NEPTUNE II (INSTRUMENTS) ×2 IMPLANT
MARKER SKIN DUAL TIP RULER LAB (MISCELLANEOUS) ×2 IMPLANT
NEEDLE HYPO 22GX1.5 SAFETY (NEEDLE) ×2 IMPLANT
NEEDLE SPNL 18GX3.5 QUINCKE PK (NEEDLE) ×4 IMPLANT
PACK LAMINECTOMY ORTHO (CUSTOM PROCEDURE TRAY) ×2 IMPLANT
PAD ABD 8X10 STRL (GAUZE/BANDAGES/DRESSINGS) ×2 IMPLANT
PATTIES SURGICAL .5 X.5 (GAUZE/BANDAGES/DRESSINGS) ×1 IMPLANT
PATTIES SURGICAL .75X.75 (GAUZE/BANDAGES/DRESSINGS) ×4 IMPLANT
PATTIES SURGICAL 1X1 (DISPOSABLE) ×2 IMPLANT
SPONGE LAP 4X18 X RAY DECT (DISPOSABLE) ×6 IMPLANT
SPONGE SURGIFOAM ABS GEL 100 (HEMOSTASIS) ×2 IMPLANT
STAPLER VISISTAT 35W (STAPLE) ×2 IMPLANT
SUT VIC AB 0 CT1 27 (SUTURE) ×2
SUT VIC AB 0 CT1 27XBRD ANTBC (SUTURE) ×1 IMPLANT
SUT VIC AB 1 CT1 27 (SUTURE) ×8
SUT VIC AB 1 CT1 27XBRD ANTBC (SUTURE) ×3 IMPLANT
SYR 20CC LL (SYRINGE) ×4 IMPLANT
TAPE CLOTH SURG 6X10 WHT LF (GAUZE/BANDAGES/DRESSINGS) ×2 IMPLANT
TOWEL OR 17X26 10 PK STRL BLUE (TOWEL DISPOSABLE) ×2 IMPLANT
TRAY FOLEY CATH 16FRSI W/METER (SET/KITS/TRAYS/PACK) ×2 IMPLANT

## 2015-08-04 NOTE — Progress Notes (Signed)
Unable to get Dr. Linna Caprice (who is covering for Dr. Lajuan Lines notify patient CBG is 270 and patient took Januvia 50mg  this am at 5:30. Note on front of chart.

## 2015-08-04 NOTE — Anesthesia Postprocedure Evaluation (Signed)
Anesthesia Post Note  Patient: [name]  Procedure(s) Performed: Procedure(s) (LRB): CENTRAL DECOMPRESSION  LUMBAR LAMINECTOMY L4-L5  (N/A)  Patient location during evaluation: PACU Anesthesia Type: General Level of consciousness: awake and awake and alert Pain management: pain level controlled Vital Signs Assessment: post-procedure vital signs reviewed and stable Respiratory status: spontaneous breathing Anesthetic complications: no    Last Vitals:  Filed Vitals:   08/04/15 1510 08/04/15 1603  BP: 123/62 131/69  Pulse: 55 56  Temp: 36.7 C 36.8 C  Resp: 16 16    Last Pain:  Filed Vitals:   08/04/15 1720  PainSc: 9                  Keontre Defino COKER

## 2015-08-04 NOTE — Discharge Instructions (Addendum)
No lifting or bending For the first few days, remove your dressing, tape a piece of saran wrap over your incision.  Take your shower, then remove the saran wrap and put a clean dressing on. After three days you can shower without the saran wrap.  Take aspirin 325mg  daily to prevent blood clots Call Dr. Gladstone Lighter if any wound complications or temperature of 101 degrees F or over.  Call the office for an appointment to see Dr. Gladstone Lighter in two weeks: [phone] and ask for Dr. Charlestine Night nurse, Brunilda Payor.

## 2015-08-04 NOTE — Anesthesia Postprocedure Evaluation (Signed)
Anesthesia Post Note  Patient: NARESH TRIVINO  Procedure(s) Performed: Procedure(s) (LRB): CENTRAL DECOMPRESSION  LUMBAR LAMINECTOMY L4-L5  (N/A)  Patient location during evaluation: PACU Anesthesia Type: General Level of consciousness: awake and awake and alert Pain management: pain level controlled Vital Signs Assessment: post-procedure vital signs reviewed and stable Anesthetic complications: no    Last Vitals:  Filed Vitals:   08/04/15 1238 08/04/15 1249  BP:  113/60  Pulse: 58 62  Temp:  36.4 C  Resp:      Last Pain:  Filed Vitals:   08/04/15 1301  PainSc: Asleep                 Jamaria Amborn COKER

## 2015-08-04 NOTE — Interval H&P Note (Signed)
History and Physical Interval Note:  08/04/2015 8:21 AM  [name]  has presented today for surgery, with the diagnosis of SPINAL STENOSIS, PARTIAL LEFT FOOT DROP  The various methods of treatment have been discussed with the patient and family. After consideration of risks, benefits and other options for treatment, the patient has consented to  Procedure(s): CENTRAL DECOMPRESSION  LUMBAR LAMINECTOMY L4-L5  (N/A) as a surgical intervention .  The patient's history has been reviewed, patient examined, no change in status, stable for surgery.  I have reviewed the patient's chart and labs.  Questions were answered to the patient's satisfaction.     Adonnis Salceda A

## 2015-08-04 NOTE — Brief Op Note (Signed)
08/04/2015  11:10 AM  PATIENT:  [name]  74 y.o. male  PRE-OPERATIVE DIAGNOSIS:  SPINAL STENOSIS,L-4-L-5 and a PARTIAL LEFT FOOT DROP.Foraminal Stenosis involving the L-4 and L-5 nerve Roots,Bilaterally.  POST-OPERATIVE DIAGNOSIS: Same as Pre-Op  PROCEDURE:  Procedure(s): CENTRAL DECOMPRESSION  LUMBAR LAMINECTOMY L4-L5  (N/A) for Spinal Stenosis and BILATERAL,TWO LEVEL foraminotomies for the L-4-L-5 Nerve Roots.  SURGEON:  Surgeon(s) and Role:    * Latanya Maudlin, MD - Primary    * Magnus Sinning, MD - Assisting    ASSISTANTS:James Aplington MD   ANESTHESIA:   general  EBL:  Total I/O In: 1500 [I.V.:1500] Out: 200 [Urine:200]  BLOOD ADMINISTERED:none  DRAINS: none   LOCAL MEDICATIONS USED:  MARCAINE  20cc of 0.25% with Epinephrine at start of the case and 20cc of Exparel at the end of the case.   SPECIMEN:  No Specimen  DISPOSITION OF SPECIMEN:  N/A  COUNTS:  YES  TOURNIQUET:  * No tourniquets in log *  DICTATION: .Other Dictation: Dictation Number [id number]  PLAN OF CARE: Admit for overnight observation  PATIENT DISPOSITION:  Stable in OR   Delay start of Pharmacological VTE agent (>24hrs) due to surgical blood loss or risk of bleeding: yes

## 2015-08-04 NOTE — Anesthesia Procedure Notes (Signed)
Procedure Name: Intubation Date/Time: 08/04/2015 8:51 AM Performed by: Ofilia Neas Pre-anesthesia Checklist: Patient identified, Timeout performed, Emergency Drugs available, Suction available and Patient being monitored Patient Re-evaluated:Patient Re-evaluated prior to inductionOxygen Delivery Method: Circle system utilized Preoxygenation: Pre-oxygenation with 100% oxygen Intubation Type: IV induction and Cricoid Pressure applied Ventilation: Mask ventilation without difficulty Laryngoscope Size: Miller and 3 Grade View: Grade III Tube type: Oral Tube size: 7.5 mm Number of attempts: 2 Airway Equipment and Method: Stylet Placement Confirmation: positive ETCO2 and breath sounds checked- equal and bilateral Secured at: 21 cm Tube secured with: Tape Dental Injury: Teeth and Oropharynx as per pre-operative assessment and Injury to lip  Difficulty Due To: Difficulty was anticipated, Difficult Airway- due to large tongue, Difficult Airway- due to reduced neck mobility, Difficult Airway- due to dentition, Difficult Airway- due to limited oral opening, Difficult Airway- due to anterior larynx and Difficult Airway- due to immobile epiglottis Future Recommendations: Recommend- induction with short-acting agent, and alternative techniques readily available Comments: Arytenoids visualized with cricoid pressure.  Limited oral opening, very limited neck extension.  Intubated by Dr. Linna Caprice.

## 2015-08-04 NOTE — Transfer of Care (Signed)
Immediate Anesthesia Transfer of Care Note  Patient: [name]  Procedure(s) Performed: Procedure(s): CENTRAL DECOMPRESSION  LUMBAR LAMINECTOMY L4-L5  (N/A)  Patient Location: PACU  Anesthesia Type:General  Level of Consciousness: awake, oriented, patient cooperative, lethargic and responds to stimulation  Airway & Oxygen Therapy: Patient Spontanous Breathing and Patient connected to face mask oxygen  Post-op Assessment: Report given to RN, Post -op Vital signs reviewed and stable and Patient moving all extremities  Post vital signs: Reviewed and stable  Last Vitals:  Filed Vitals:   08/04/15 0638 08/04/15 1142  BP: 133/73 128/71  Pulse: 49 68  Temp: 36.3 C   Resp: 20 14    Complications: No apparent anesthesia complications

## 2015-08-04 NOTE — Op Note (Signed)
NAME:  [name], [name]                ACCOUNT NO.:  [id number]  MEDICAL RECORD NO.:  [id number]  LOCATION:  [facility]                         FACILITY:  [facility]  PHYSICIAN:  Kipp Brood. Courtnee Myer, M.D.DATE OF BIRTH:  [birth year]  DATE OF PROCEDURE: DATE OF DISCHARGE:                              OPERATIVE REPORT   SURGEON:  Dantonio Justen A. Gladstone Lighter, M.D.  ASSISTANT:  Tarri Glenn, M.D.  PREOPERATIVE DIAGNOSES: 1. Footdrop on the left secondary to spinal stenosis. 2. Spinal stenosis with a severe block at L4-5. 3. Foraminal stenosis for the L4 root on the right. 4. Foraminal stenosis for the L5 root on the right. 5. Foraminal stenosis for the L4 root on the left. 6. Foraminal stenosis for the L5 root on the left.  POSTOPERATIVE DIAGNOSES: 1. Footdrop on the left secondary to spinal stenosis. 2. Spinal stenosis with a severe block at L4-5. 3. Foraminal stenosis for the L4 root on the right. 4. Foraminal stenosis for the L5 root on the right. 5. Foraminal stenosis for the L4 root on the left. 6. Foraminal stenosis for the L5 root on the left.  OPERATION: 1. Complete decompressive lumbar laminectomy at L4-5 for spinal     stenosis. 2. Foraminotomy for the L4 root on the left. 3. Foraminotomy for the L5 root on the left. 4. Foraminotomy for the L4 root on the right. 5. Foraminotomy for the L5 root on the right.  DESCRIPTION OF PROCEDURE:  Under general anesthesia with the patient on spinal frame, routine orthopedic prep and draping of the lower back was carried out.  The appropriate time-out was carried out.  I did not have to mark the back because of the central decompression we carried out. After the time-out, 2 needles were placed in the back for localization purposes.  X-ray was taken.  At this time, an incision was made over the L4-5 interspace and extended proximally and distally.  He had 2 g of IV Ancef preop.  The muscle then was stripped from the lamina and spinous processes  bilaterally at 2 levels.  Another x-ray was taken with a Kocher clamps in place.  Following that, the self-retaining McCullough retractors were inserted.  I then removed the spinous process of L4, part of L3 above part of L5 below.  I then proceeded to complete my decompression laterally.  Great care was taken to protect the underlying dura.  Once this was done, the microscope was brought in.  We then left the ligamentum flavum in place as we did our dissection out to the lateral recesses bilaterally.  We finally removed the ligamentum flavum. We protected the dura at all times with cottonoids, irrigated out the wound during the procedure.  I went out further to decompress both lateral recesses and it was extremely tight 4-5.  After this was done, we did foraminotomies at the L4 root and the L5 root on the left, then in the same with the L4 root and L5 root on the right.  Two instruments were placed in the back, another x-ray was taken to verify the position. Now, we dissected proximally as far up until we had complete freedom of the stenosis.  We dissected distally  the same until we had complete decompression for the spinal stenosis distal.  The recesses were thoroughly decompressed.  We thoroughly irrigated out the area and we were able easily pass a hockey-stick out the foramina for both sides, both nerve roots as well.  We then loosely applied thrombin-soaked Gelfoam, closed the wound layers in usual fashion except I left a small distal deep and proximal deep part of the wound open for drainage purposes.  At the beginning of the case, I injected 20 mL of 0.25% Marcaine with epinephrine into the soft tissues to prevent bleeding.  At the end of the case, I injected 20 mL of Exparel into the soft tissue. Then, the subcu was closed with #1 Vicryl, skin with metal staples and a sterile Neosporin dressing was applied.          ______________________________ Kipp Brood Gladstone Lighter,  M.D.     RAG/MEDQ  D:  08/04/2015  T:  08/04/2015  Job:  [id number]

## 2015-08-04 NOTE — Anesthesia Preprocedure Evaluation (Signed)
Anesthesia Evaluation  Patient identified by MRN, date of birth, ID band Patient awake    Reviewed: Allergy & Precautions, NPO status , Patient's Chart, lab work & pertinent test results  Airway Mallampati: II  TM Distance: >3 FB Neck ROM: Full    Dental  (+) Teeth Intact, Dental Advisory Given   Pulmonary    breath sounds clear to auscultation       Cardiovascular hypertension,  Rhythm:Regular     Neuro/Psych    GI/Hepatic   Endo/Other  diabetes  Renal/GU      Musculoskeletal   Abdominal   Peds  Hematology   Anesthesia Other Findings   Reproductive/Obstetrics                             Anesthesia Physical Anesthesia Plan  ASA: III  Anesthesia Plan: General   Post-op Pain Management:    Induction: Intravenous  Airway Management Planned: Oral ETT  Additional Equipment:   Intra-op Plan:   Post-operative Plan: Extubation in OR  Informed Consent: I have reviewed the patients History and Physical, chart, labs and discussed the procedure including the risks, benefits and alternatives for the proposed anesthesia with the patient or authorized representative who has indicated his/her understanding and acceptance.   Dental advisory given  Plan Discussed with: CRNA and Anesthesiologist  Anesthesia Plan Comments:         Anesthesia Quick Evaluation

## 2015-08-05 ENCOUNTER — Encounter (HOSPITAL_COMMUNITY): Payer: Self-pay | Admitting: Radiology

## 2015-08-05 ENCOUNTER — Observation Stay (HOSPITAL_COMMUNITY): Payer: Medicare Other

## 2015-08-05 ENCOUNTER — Observation Stay (HOSPITAL_BASED_OUTPATIENT_CLINIC_OR_DEPARTMENT_OTHER): Payer: Medicare Other

## 2015-08-05 DIAGNOSIS — R1110 Vomiting, unspecified: Secondary | ICD-10-CM | POA: Diagnosis not present

## 2015-08-05 DIAGNOSIS — R5381 Other malaise: Secondary | ICD-10-CM | POA: Diagnosis not present

## 2015-08-05 DIAGNOSIS — M4800 Spinal stenosis, site unspecified: Secondary | ICD-10-CM | POA: Diagnosis not present

## 2015-08-05 DIAGNOSIS — G9340 Encephalopathy, unspecified: Secondary | ICD-10-CM | POA: Diagnosis not present

## 2015-08-05 DIAGNOSIS — I6789 Other cerebrovascular disease: Secondary | ICD-10-CM | POA: Diagnosis not present

## 2015-08-05 DIAGNOSIS — J449 Chronic obstructive pulmonary disease, unspecified: Secondary | ICD-10-CM | POA: Diagnosis present

## 2015-08-05 DIAGNOSIS — F05 Delirium due to known physiological condition: Secondary | ICD-10-CM | POA: Diagnosis not present

## 2015-08-05 DIAGNOSIS — R262 Difficulty in walking, not elsewhere classified: Secondary | ICD-10-CM | POA: Diagnosis not present

## 2015-08-05 DIAGNOSIS — Z820 Family history of epilepsy and other diseases of the nervous system: Secondary | ICD-10-CM | POA: Diagnosis not present

## 2015-08-05 DIAGNOSIS — I10 Essential (primary) hypertension: Secondary | ICD-10-CM | POA: Diagnosis present

## 2015-08-05 DIAGNOSIS — M21372 Foot drop, left foot: Secondary | ICD-10-CM | POA: Diagnosis present

## 2015-08-05 DIAGNOSIS — R531 Weakness: Secondary | ICD-10-CM | POA: Diagnosis not present

## 2015-08-05 DIAGNOSIS — M4806 Spinal stenosis, lumbar region: Secondary | ICD-10-CM | POA: Diagnosis present

## 2015-08-05 DIAGNOSIS — E119 Type 2 diabetes mellitus without complications: Secondary | ICD-10-CM | POA: Diagnosis present

## 2015-08-05 DIAGNOSIS — E669 Obesity, unspecified: Secondary | ICD-10-CM | POA: Diagnosis not present

## 2015-08-05 DIAGNOSIS — N400 Enlarged prostate without lower urinary tract symptoms: Secondary | ICD-10-CM | POA: Diagnosis not present

## 2015-08-05 DIAGNOSIS — R410 Disorientation, unspecified: Secondary | ICD-10-CM | POA: Diagnosis not present

## 2015-08-05 DIAGNOSIS — N183 Chronic kidney disease, stage 3 (moderate): Secondary | ICD-10-CM | POA: Diagnosis not present

## 2015-08-05 DIAGNOSIS — R4701 Aphasia: Secondary | ICD-10-CM | POA: Diagnosis not present

## 2015-08-05 DIAGNOSIS — Z9049 Acquired absence of other specified parts of digestive tract: Secondary | ICD-10-CM | POA: Diagnosis not present

## 2015-08-05 DIAGNOSIS — F319 Bipolar disorder, unspecified: Secondary | ICD-10-CM | POA: Diagnosis present

## 2015-08-05 DIAGNOSIS — Z8249 Family history of ischemic heart disease and other diseases of the circulatory system: Secondary | ICD-10-CM | POA: Diagnosis not present

## 2015-08-05 DIAGNOSIS — M545 Low back pain: Secondary | ICD-10-CM | POA: Diagnosis not present

## 2015-08-05 LAB — COMPREHENSIVE METABOLIC PANEL
ALT: 18 U/L (ref 17–63)
AST: 25 U/L (ref 15–41)
Albumin: 3.2 g/dL — ABNORMAL LOW (ref 3.5–5.0)
Alkaline Phosphatase: 59 U/L (ref 38–126)
Anion gap: 9 (ref 5–15)
BUN: 20 mg/dL (ref 6–20)
CO2: 27 mmol/L (ref 22–32)
Calcium: 8.7 mg/dL — ABNORMAL LOW (ref 8.9–10.3)
Chloride: 104 mmol/L (ref 101–111)
Creatinine, Ser: 1.45 mg/dL — ABNORMAL HIGH (ref 0.61–1.24)
GFR calc Af Amer: 54 mL/min — ABNORMAL LOW (ref >60)
GFR calc non Af Amer: 47 mL/min — ABNORMAL LOW (ref >60)
Glucose, Bld: 187 mg/dL — ABNORMAL HIGH (ref 65–99)
Potassium: 4.1 mmol/L (ref 3.5–5.1)
Sodium: 140 mmol/L (ref 135–145)
Total Bilirubin: 1 mg/dL (ref 0.3–1.2)
Total Protein: 6 g/dL — ABNORMAL LOW (ref 6.5–8.1)

## 2015-08-05 LAB — GLUCOSE, CAPILLARY
Glucose-Capillary: 190 mg/dL — ABNORMAL HIGH (ref 65–99)
Glucose-Capillary: 164 mg/dL — ABNORMAL HIGH (ref 65–99)
Glucose-Capillary: 182 mg/dL — ABNORMAL HIGH (ref 65–99)
Glucose-Capillary: 163 mg/dL — ABNORMAL HIGH (ref 65–99)
Glucose-Capillary: 161 mg/dL — ABNORMAL HIGH (ref 65–99)

## 2015-08-05 LAB — TSH: TSH: 0.466 u[IU]/mL (ref 0.350–4.500)

## 2015-08-05 LAB — VALPROIC ACID LEVEL: Valproic Acid Lvl: 35 ug/mL — ABNORMAL LOW (ref 50.0–100.0)

## 2015-08-05 MED ORDER — CETYLPYRIDINIUM CHLORIDE 0.05 % MT LIQD
7.0000 mL | Freq: Two times a day (BID) | OROMUCOSAL | Status: DC
  Administered 2015-08-05 – 2015-08-09 (×7): 7 mL via OROMUCOSAL

## 2015-08-05 MED ORDER — SODIUM CHLORIDE 0.9 % IV BOLUS (SEPSIS)
1000.0000 mL | Freq: Once | INTRAVENOUS | Status: AC
  Administered 2015-08-05: 1000 mL via INTRAVENOUS

## 2015-08-05 MED ORDER — STROKE: EARLY STAGES OF RECOVERY BOOK
Freq: Once | Status: AC
  Administered 2015-08-05: 11:00:00
  Filled 2015-08-05: qty 1

## 2015-08-05 NOTE — Progress Notes (Signed)
MRI negative for stroke. I suspect that this represents post-operative delirium. I would continue with PT/OT, keeping lights on during day, minimizing stimulation at night.   His depakote is used for bipolar, not seizures, so would not adjust dose just because of his level.   Please call if any further issues arise or there are concerns with his progress.   Roland Rack, MD [facility] [phone]  If 7pm- 7am, please page neurology on call as listed in [place].

## 2015-08-05 NOTE — Progress Notes (Signed)
PT Cancellation Note  Patient Details Name: [name] MRN: [id number] DOB: 1940/09/01   Cancelled Treatment:    Reason Eval/Treat Not Completed: Medical issues which prohibited therapy (transferred to ICU with AMS. check back later today.)   Marcelino Freestone PT [id number]  08/05/2015, 7:53 AM

## 2015-08-05 NOTE — Progress Notes (Signed)
*  PRELIMINARY RESULTS* Echocardiogram 2D Echocardiogram has been performed.  Leavy Cella 08/05/2015, 1:43 PM

## 2015-08-05 NOTE — Progress Notes (Signed)
Subjective: 1 Day Post-Op Procedure(s) (LRB): CENTRAL DECOMPRESSION  LUMBAR LAMINECTOMY L4-L5  (N/A) Patient reports pain as 2 on 0-10 scale. Cleared by Neurology in regards to a CVA. He is much better today. I spoke with Loretta,his friend and we both agree that,when more stable that we plan on SNF the first of the week.He still has some Pre-Op weakness in his Left Foot.Dressing changed and wound looks fine.Will have PT evaluate and transfer to Ortho floor.   Objective: Vital signs in last 24 hours: Temp:  [97.5 F (36.4 C)-99 F (37.2 C)] 99 F (37.2 C) (12/22 0418) Pulse Rate:  [52-78] 78 (12/22 0418) Resp:  [9-18] 18 (12/22 0418) BP: (109-136)/(60-79) 136/71 mmHg (12/22 0418) SpO2:  [93 %-100 %] 95 % (12/22 0418)  Intake/Output from previous day: 12/21 0701 - 12/22 0700 In: 3555 [P.O.:580; I.V.:2925; IV Piggyback:50] Out: 350 [Urine:350] Intake/Output this shift:    No results for input(s): HGB in the last 72 hours. No results for input(s): WBC, RBC, HCT, PLT in the last 72 hours.  Recent Labs  08/02/15 0855  NA 141  K 4.6  CL 104  CO2 28  BUN 20  CREATININE 1.46*  GLUCOSE 294*  CALCIUM 9.5    Recent Labs  08/02/15 0855  INR 0.98    Weakness of Left foot as he had Pre-Op  Assessment/Plan: 1 Day Post-Op Procedure(s) (LRB): CENTRAL DECOMPRESSION  LUMBAR LAMINECTOMY L4-L5  (N/A) Up with therapy. Plan is SNF  Jaydian Santana A 08/05/2015, 7:19 AM

## 2015-08-05 NOTE — Significant Event (Signed)
Rapid Response Event Note  Overview: Time Called: 0635 Arrival Time: 0642 Event Type: NeurologicInitial Focused Assessment:PATIENT POST OP LUMBAR LAMINECTOMY WITH INCREASED WEAKNESS AND EXPRESSIVE APHASIA. HE IS RESTING IN BED AND ABLE TO SAY HIS NAME BUT ONLY HIS NAME TO ALL QUESTIONS. HE FOLLOWS COMMANDS AND MAE. HE IS WEAK WITH LEFT ARM AND LEG MORE WEAK. PUPILS 2 BILATERALLY AND SLUGGISH.   Interventions:GLUCOSE 190, CT HEAD WITHOUT CONTRAST, CODE STROKE CALLED, NS BOLUS, NEURO CONSULT  Event Summary: Name of Physician Notified: CARMEN MAYO PA at 0620--DR. GIOFFRE SAW PATIENT UPON ARRIVAL IN ICU  Name of Consulting Physician Notified: DR. Gari Crown at 0730  Outcome: Transferred (Comment) (TO CT AND THEN 1230)  Event End Time: 0740  Pricilla Riffle

## 2015-08-05 NOTE — Consult Note (Addendum)
Neurology Consultation Reason for Consult: Altered mental status Referring Physician: Gioffre, R  CC: Altered mental status  History is obtained from:patient, fiance  HPI: [name] is a 74 y.o. 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 He was diagnosed with bipolar disorder and started on depakote and seroquel. There is question if he truly has MCI or if his underlying disorder is more psychiatric in nature.   This morning, on awakening, he appeared to be confused and a code stroke was activated. After discussing with staff here, however, it was deemed that he was not a candidate for intervention given that he was last seen well prior to bed, and recently underwent significant surgery.  Of note, he states that he has become confused with previous hospitalizations in the past.    LKW: 12 am tpa given?: no, recent surgery.  ROS: A 14 point ROS was performed and is negative except as noted in the HPI.   Past Medical History  Diagnosis Date  . Microscopic colitis   . Obesity   . HLD (hyperlipidemia)     takes Lipitor daily  . COPD (chronic obstructive pulmonary disease) ([place])   . Asthma   . ED (erectile dysfunction)   . Diverticulosis 01/2007  . Internal hemorrhoids 01/2007  . Cataract   . HTN (hypertension)     takes Altace and Diovan daily  . Pneumonia 3/09  . Joint pain   . Joint swelling   . GERD (gastroesophageal reflux disease)     uses Tums  . History of colon polyps   . Nocturia   . Impaired hearing     "getting this checked out on Monday"  . Anxiety     Xanax as needed  . Depression     takes Cymbalta daily  . Insomnia   . Crohn's colitis ([place])   . Sleep apnea     uses CPAP;has been greater than 41yrs since sleep study- Dr. Elsworth Soho  . DM (diabetes mellitus) ([place])     takes Januvia now, metformin stopped due to diarrhea  . Bipolar disorder ([place])    . Chronic kidney disease     since NSAID's stopped -function has improved  . Arthritis     spinal stenosis.  . Glaucoma     surgery to correct     Family History  Problem Relation Age of Onset  . Esophageal cancer Paternal Grandmother   . Colon cancer Neg Hx   . Heart failure Mother   . Alzheimer's disease Brother      Social History:  reports that he has never smoked. He has never used smokeless tobacco. He reports that he drinks alcohol. He reports that he does not use illicit drugs.   Exam: Current vital signs: BP 140/80 mmHg  Pulse 85  Temp(Src) 98 F (36.7 C) (Oral)  Resp 18  Ht 5\' 7"  (1.702 m)  Wt 109.8 kg (242 lb 1 oz)  BMI 37.90 kg/m2  SpO2 97% Vital signs in last 24 hours: Temp:  [97.5 F (36.4 C)-99.8 F (37.7 C)] 98 F (36.7 C) (12/22 0800) Pulse Rate:  [52-85] 85 (12/22 0800) Resp:  [9-19] 18 (12/22 0800) BP: (109-152)/(60-94) 140/80 mmHg (12/22 0800) SpO2:  [93 %-100 %] 97 % (12/22 0800) Weight:  [109.8 kg (242 lb 1 oz)] 109.8 kg (242 lb 1 oz) (12/22 0800)  Physical Exam  Constitutional: Appears well-developed and well-nourished.  Psych: Affect appropriate to situation Eyes: No scleral injection HENT: No OP obstrucion Head: Normocephalic.  Cardiovascular: Normal rate and regular rhythm.  Respiratory: Effort normal and breath sounds normal to anterior ascultation GI: Soft.  No distension. There is no tenderness.  Skin: WDI  Neuro: Mental Status: Patient is awake, alert, interactive and appropriate. He does appear to have some word finding difficulties and is able to give me his age, but has not been oriented per nursing. He has some mild perseveration. (When I ask about weakness, he responds "I have been a bit confused" which is the question I asked him prior to discussing weakness.  Cranial Nerves: II: Visual Fields are full. No extinction to VF testing. Pupils are equal, round, and reactive to light.   III,IV, VI: EOMI without ptosis or  diploplia.  V: Facial sensation is symmetric to temperature VII: Facial movement is ? Right facial weakness.  VIII: hearing is intact to voice X: Uvula elevates symmetrically XI: Shoulder shrug is symmetric. XII: tongue is midline without atrophy or fasciculations.  Motor: Tone is normal. Bulk is normal. 5/5 strength was present on the right arm, he will not keep either leg raised against gravity at the hip, but will keep them extended at the knee with the knee supported. He may have some left arm weakness as well, but level of effort is not clear. He does have left dorsiflexion weakness, but this is baseline from prior to surgery.  Sensory: Sensation is symmetric to temperature, inconsistent answers to light touch.  Cerebellar: No clear ataxia.    I have reviewed labs in epic and the results pertinent to this consultation are: Mildly elevated creatinine on arrival.    I have reviewed the images obtained:CT head - no acute findings.   Dx:  Acute encephalopathy Left arm weakness Aphasia  Impression: 74 yo M POD#1 from spinal stenosis surgery who awoke with altered mental status and question of left sided weakness. I am concerned that this may represent acute infarct, but do not feel that this is definite. An MRI would be invaluable.   Recommendations: 1) MRI/ MRA head 2) Will pursue stroke workup if positive.    Roland Rack, MD [facility] [phone]  If 7pm- 7am, please page neurology on call as listed in [place].

## 2015-08-05 NOTE — Progress Notes (Signed)
Notified Rapid response RN about pt having difficulty speaking and increased weakness. Notified [facility] PA, obtained order for stat head CT. Vital signs stable. Blood glucose 190. Will continue to monitor.

## 2015-08-05 NOTE — Progress Notes (Signed)
OT Cancellation Note  Patient Details Name: ZARAK CATON MRN: [id number] DOB: 1940/12/31   Cancelled Treatment:    Reason Eval/Treat Not Completed: Medical issues which prohibited therapy Will check on pt later in day or next day.   Betsy Pries 08/05/2015, 1:17 PM

## 2015-08-05 NOTE — Progress Notes (Signed)
Subjective: 1 Day Post-Op Procedure(s) (LRB): CENTRAL DECOMPRESSION  LUMBAR LAMINECTOMY L4-L5  (N/A) Patient reports pain as 1 on 0-10 scale.  Awakened this morning confused. CT Head was Normal.Neurologist will see him.Transferred  To Stepdown.He is stable but exhibits confusion to where he is but knows what surgery he had and what my name is.Vitals are stable.  Objective: Vital signs in last 24 hours: Temp:  [97.5 F (36.4 C)-99 F (37.2 C)] 99 F (37.2 C) (12/22 0418) Pulse Rate:  [52-78] 78 (12/22 0418) Resp:  [9-18] 18 (12/22 0418) BP: (109-136)/(60-79) 136/71 mmHg (12/22 0418) SpO2:  [93 %-100 %] 95 % (12/22 0418)  Intake/Output from previous day: 12/21 0701 - 12/22 0700 In: 3555 [P.O.:580; I.V.:2925; IV Piggyback:50] Out: 350 [Urine:350] Intake/Output this shift:    No results for input(s): HGB in the last 72 hours. No results for input(s): WBC, RBC, HCT, PLT in the last 72 hours.  Recent Labs  08/02/15 0855  NA 141  K 4.6  CL 104  CO2 28  BUN 20  CREATININE 1.46*  GLUCOSE 294*  CALCIUM 9.5    Recent Labs  08/02/15 0855  INR 0.98    Confused this morning.  Assessment/Plan: 1 Day Post-Op Procedure(s) (LRB): CENTRAL DECOMPRESSION  LUMBAR LAMINECTOMY L4-L5  (N/A) Up with therapy when he clears medically. Neuro will see him.  Cythina Mickelsen A 08/05/2015, 7:31 AM

## 2015-08-06 LAB — GLUCOSE, CAPILLARY
Glucose-Capillary: 148 mg/dL — ABNORMAL HIGH (ref 65–99)
Glucose-Capillary: 219 mg/dL — ABNORMAL HIGH (ref 65–99)
Glucose-Capillary: 152 mg/dL — ABNORMAL HIGH (ref 65–99)
Glucose-Capillary: 137 mg/dL — ABNORMAL HIGH (ref 65–99)

## 2015-08-06 MED ORDER — ASPIRIN 325 MG PO TABS
325.0000 mg | ORAL_TABLET | Freq: Every day | ORAL | Status: DC
Start: 2015-08-06 — End: 2015-08-09
  Administered 2015-08-06 – 2015-08-09 (×4): 325 mg via ORAL
  Filled 2015-08-06 (×4): qty 1

## 2015-08-06 NOTE — Clinical Social Work Placement (Signed)
   CLINICAL SOCIAL WORK PLACEMENT  NOTE  Date:  08/06/2015  Patient Details  Name: [name] MRN: [id number] Date of Birth: 08-01-1941  Clinical Social Work is seeking post-discharge placement for this patient at the [place] level of care (*CSW will initial, date and re-position this form in  chart as items are completed):  Yes   Patient/family provided with [place] Work Department's list of facilities offering this level of care within the geographic area requested by the patient (or if unable, by the patient's family).      Patient/family informed of their freedom to choose among providers that offer the needed level of care, that participate in Medicare, Medicaid or managed care program needed by the patient, have an available bed and are willing to accept the patient.  Yes   Patient/family informed of 's ownership interest in [facility] and [facility], as well as of the fact that they are under no obligation to receive care at these facilities.  PASRR submitted to EDS on 08/06/15     PASRR number received on       Existing PASRR number confirmed on       FL2 transmitted to all facilities in geographic area requested by pt/family on       FL2 transmitted to all facilities within larger geographic area on       Patient informed that his/her managed care company has contracts with or will negotiate with certain facilities, including the following:            Patient/family informed of bed offers received.  Patient chooses bed at       Physician recommends and patient chooses bed at      Patient to be transferred to   on  .  Patient to be transferred to facility by       Patient family notified on   of transfer.  Name of family member notified:        PHYSICIAN       Additional Comment:    _______________________________________________ Luretha Rued, [place]  [phone] 08/06/2015, 10:48 AM

## 2015-08-06 NOTE — Progress Notes (Signed)
CSW assisting with d/c planning to SNF. PASRR submitted this am and requires " manual review ".  PASRR is closed today / Monday due to holiday, and will not open until Tuesday 12/27. Pt is unable to be d/c to SNF without a PASRR #. CSW will continue to check for PASRR # and assist with d/c planning to SNF. Pt is hoping for a rehab bed at [facility] or [place].  Werner Lean LCSW [phone]

## 2015-08-06 NOTE — Clinical Social Work Note (Signed)
Clinical Social Work Assessment  Patient Details  Name: [name] MRN: [id number] Date of Birth: [birth year]  Date of referral:  08/06/15               Reason for consult:  Facility Placement, Discharge Planning                Permission sought to share information with:  Chartered certified accountant granted to share information::  Yes, Verbal Permission Granted  Name::        Agency::     Relationship::     Contact Information:     Housing/Transportation Living arrangements for the past 2 months:  Single Family Home Source of Information:  Patient Patient Interpreter Needed:  None Criminal Activity/Legal Involvement Pertinent to Current Situation/Hospitalization:  No - Comment as needed Significant Relationships:  Adult Children, Significant Other Lives with:  Self Do you feel safe going back to the place where you live?   (ST Rehab needed.) Need for family participation in patient care:  No (Coment)  Care giving concerns:  Pt's care cannot be managed at home following hospital d/c.   Social Worker assessment / plan:  Pt hospitalized on 08/04/15 for pre planned surgery for spinal stenosis . CSW met with pt to assist with d/c planning. MD has recommended ST Rehab at d/c. Pt is in agreement with this plan. SNF search has been initiated and bed offers are pending. CSW will meet with pt / significant other to review bed offers this afternoon.  Employment status:  Retired Forensic scientist:  Medicare PT Recommendations:  [place] / Referral to community resources:  [place]  Patient/Family's Response to care: Pt feels ST rehab is needed.  Patient/Family's Understanding of and Emotional Response to Diagnosis, Current Treatment, and Prognosis:  Pt is aware of his medical status. Pt is motivated to work with therapy and is hopeful that [facility] or [facility] will have an opening for him at d/c  Emotional  Assessment Appearance:  Appears stated age Attitude/Demeanor/Rapport:  Other (cooperative) Affect (typically observed):  Calm, Pleasant Orientation:  Oriented to Self, Oriented to Place, Oriented to  Time, Oriented to Situation Alcohol / Substance use:  Not Applicable Psych involvement (Current and /or in the community):  No (Comment)  Discharge Needs  Concerns to be addressed:  Discharge Planning Concerns Readmission within the last 30 days:  No Current discharge risk:  None Barriers to Discharge:  No Barriers Identified   Loraine Maple  [phone] 08/06/2015, 10:37 AM

## 2015-08-06 NOTE — Evaluation (Signed)
Occupational Therapy Evaluation Patient Details Name: [name] MRN: [id number] DOB: [birth year] Today's Date: 08/06/2015    History of Present Illness pt is s/p L4-5 decompression.  Post sx, he was confused and worked up for CVA, which was negative.   Clinical Impression   This 74 year old man was admitted for the above surgery.  He will benefit from continued OT to increase safety and independence with adls.  Goals in acute are for min +2 assistance to ambulate to bathroom for 3:1, reinforcement of back precautions and education to use AE for ADLs to observe these precautions.    Follow Up Recommendations  SNF    Equipment Recommendations  3 in 1 bedside comode    Recommendations for Other Services       Precautions / Restrictions Precautions Precautions: Fall;Back Restrictions Weight Bearing Restrictions: No      Mobility Bed Mobility Overal bed mobility: Needs Assistance;+ 2 for safety/equipment;+2 for physical assistance Bed Mobility: Sit to Sidelying;Rolling Rolling: +2 for physical assistance;Mod assist       Sit to sidelying: Mod assist General bed mobility comments: increased time; cues for back precautions; assist for legs and trunk  Transfers Overall transfer level: Needs assistance Equipment used: Rolling walker (2 wheeled) Transfers: Sit to/from Stand Sit to Stand: Mod assist +2 physical assistance; max A +2 to control descent sitting         General transfer comment: cues for hand placement and to use LEs to power up    Balance Overall balance assessment: Needs assistance   Sitting balance-Leahy Scale: Poor                                      ADL Overall ADL's : Needs assistance/impaired Eating/Feeding: Set up;Sitting   Grooming: Set up;Supervision/safety;Sitting   Upper Body Bathing: Set up;Supervision/ safety;Sitting   Lower Body Bathing: Maximal assistance;+2 for physical assistance;+2 for safety/equipment;Sit  to/from stand   Upper Body Dressing : Sitting;Minimal assistance   Lower Body Dressing: Total assistance;+2 for physical assistance;+2 for safety/equipment;Sit to/from stand   Toilet Transfer: Moderate assistance;+2 for physical assistance;+2 for safety/equipment;RW (to recliner)             General ADL Comments: Pt needed cues to hold balance when sitting EOB; in chair, he can participate in UB adls with set up.  Pt needs extensive assistance for LB adls:  did not educate on AE this session but did review back precautions and ADLs     Vision     Perception     Praxis      Pertinent Vitals/Pain Pain Assessment: 0-10 Pain Score: 9  Pain Location: back  Pain Descriptors / Indicators: Sore Pain Intervention(s): Limited activity within patient's tolerance;Monitored during session;Premedicated before session;Repositioned     Hand Dominance     Extremity/Trunk Assessment Upper Extremity Assessment Upper Extremity Assessment: general weakness           Communication Communication Communication: No difficulties   Cognition Arousal/Alertness: Awake/alert Behavior During Therapy: WFL for tasks assessed/performed Overall Cognitive Status: Impaired/Different from baseline Area of Impairment: Problem solving;Orientation Orientation Level: Time   Memory: Decreased recall of precautions       Problem Solving: Slow processing;Difficulty sequencing;Requires verbal cues     General Comments       Exercises       Shoulder Instructions      Home Living Family/patient expects to be  discharged to:: Skilled nursing facility Living Arrangements: Spouse/significant other                               Additional Comments: pt lives with or "rents a room" from his fiance      Prior Functioning/Environment Level of Independence: Independent             OT Diagnosis: Generalized weakness;Acute pain   OT Problem List: Decreased strength;Decreased  activity tolerance;Impaired balance (sitting and/or standing);Decreased knowledge of use of DME or AE;Decreased knowledge of precautions;Pain;Decreased cognition;Decreased safety awareness   OT Treatment/Interventions: Self-care/ADL training;DME and/or AE instruction;Patient/family education;Balance training;Therapeutic activities;Cognitive remediation/compensation    OT Goals(Current goals can be found in the care plan section) Acute Rehab OT Goals Patient Stated Goal: less pain, get back to normal OT Goal Formulation: With patient Time For Goal Achievement: 08/13/15 Potential to Achieve Goals: Good ADL Goals Pt Will Transfer to Toilet: with min assist;with +2 assist;ambulating;bedside commode Additional ADL Goal #1: pt will recall 3/3 back precautions Additional ADL Goal #2: pt will complete UB adls with supervision from EOB without LOB/cues for posture Additional ADL Goal #3: Pt will use AE for LB adls with min cues to follow back precautions  OT Frequency: Min 2X/week   Barriers to D/C:            Co-evaluation              End of Session Nurse Communication:  (RN assisted)  Activity Tolerance: Patient tolerated treatment well Patient left: in chair;with call bell/phone within reach;with chair alarm set   Time: 0940-1004 OT Time Calculation (min): 24 min Charges:  OT General Charges $OT Visit: 1 Procedure OT Evaluation $Initial OT Evaluation Tier I: 1 Procedure OT Treatments $Therapeutic Activity: 8-22 mins G-Codes:    Raywood Wailes 2015-08-25, 12:00 PM  Lesle Chris, OTR/L [phone] Aug 25, 2015

## 2015-08-06 NOTE — Evaluation (Signed)
Physical Therapy Evaluation Patient Details Name: [name] MRN: [id number] DOB: 12-22-1940 Today's Date: 08/06/2015   History of Present Illness  pt is s/p L4-5 decompression.  Post sx, he was confused and worked up for CVA, which was negative.  Clinical Impression  Pt admitted with above diagnosis. Pt currently with functional limitations due to the deficits listed below (see PT Problem List).  Pt will benefit from skilled PT to increase their independence and safety with mobility to allow discharge to the venue listed below.  Pt will need SNF, will follow while in acute to progress mobility     Follow Up Recommendations SNF;Supervision/Assistance - 24 hour    Equipment Recommendations  None recommended by PT    Recommendations for Other Services       Precautions / Restrictions Precautions Precautions: Fall;Back Restrictions Weight Bearing Restrictions: No      Mobility  Bed Mobility Overal bed mobility: Needs Assistance;+ 2 for safety/equipment;+2 for physical assistance Bed Mobility: Sit to Sidelying;Rolling Rolling: +2 for physical assistance;Mod assist       Sit to sidelying: Min assist General bed mobility comments: incr time, cues for technique and back precautions, +2 to lower trunk and bring LEs onto bed  Transfers Overall transfer level: Needs assistance Equipment used: Rolling walker (2 wheeled) Transfers: Sit to/from Stand Sit to Stand: +2 safety/equipment;Mod assist         General transfer comment: cues for hand placement and to use LEs to power up  Ambulation/Gait Ambulation/Gait assistance: +2 safety/equipment;Mod assist Ambulation Distance (Feet): 6 Feet Assistive device: Rolling walker (2 wheeled) Gait Pattern/deviations: Wide base of support;Shuffle;Trunk flexed;Decreased step length - left;Decreased step length - right     General Gait Details: +2 for safety, assist with balance, maneuvering RW; multi-modal cues for posture, step  length adn safety  Stairs            Wheelchair Mobility    Modified Rankin (Stroke Patients Only)       Balance Overall balance assessment: Needs assistance   Sitting balance-Leahy Scale: Poor                                       Pertinent Vitals/Pain Pain Assessment: 0-10 Pain Score: 9  Pain Location: back  Pain Descriptors / Indicators: Sore Pain Intervention(s): Limited activity within patient's tolerance;Monitored during session;Premedicated before session;Repositioned    Home Living Family/patient expects to be discharged to:: Skilled nursing facility Living Arrangements: Spouse/significant other               Additional Comments: pt lives with or "rents a room" from his fiance    Prior Function Level of Independence: Independent               Hand Dominance        Extremity/Trunk Assessment   Upper Extremity Assessment: Defer to OT evaluation           Lower Extremity Assessment: Generalized weakness         Communication   Communication: No difficulties  Cognition Arousal/Alertness: Awake/alert Behavior During Therapy: WFL for tasks assessed/performed Overall Cognitive Status: Impaired/Different from baseline Area of Impairment: Problem solving;Orientation Orientation Level: Time   Memory: Decreased recall of precautions       Problem Solving: Slow processing;Difficulty sequencing;Requires verbal cues      General Comments      Exercises  Assessment/Plan    PT Assessment Patient needs continued PT services  PT Diagnosis Difficulty walking   PT Problem List Decreased activity tolerance;Decreased mobility;Decreased range of motion;Decreased strength;Decreased balance;Decreased knowledge of use of DME;Decreased knowledge of precautions  PT Treatment Interventions DME instruction;Gait training;Functional mobility training;Therapeutic activities;Patient/family education;Therapeutic exercise    PT Goals (Current goals can be found in the Care Plan section) Acute Rehab PT Goals Patient Stated Goal: less pain, get back to normal PT Goal Formulation: With patient Time For Goal Achievement: 08/13/15 Potential to Achieve Goals: Good    Frequency Min 4X/week   Barriers to discharge        Co-evaluation               End of Session Equipment Utilized During Treatment: Gait belt Activity Tolerance: Patient limited by fatigue;Patient limited by pain Patient left: in bed;with call bell/phone within reach;with family/visitor present;with bed alarm set           Time: [id number] PT Time Calculation (min) (ACUTE ONLY): 19 min   Charges:   PT Evaluation $Initial PT Evaluation Tier I: 1 Procedure     PT G CodesKenyon Ana 2015-08-10, 11:38 AM

## 2015-08-06 NOTE — NC FL2 (Signed)
[place] LEVEL OF CARE SCREENING TOOL     IDENTIFICATION  Patient Name: [name] Birthdate: 19-Dec-1940 Sex: male Admission Date (Current Location): 08/04/2015  [facility] and [place] Number:  [place] and Address:  [facility],  [place] [address] [place]      Provider Number: [id number]  Attending Physician Name and Address:  Latanya Maudlin, MD  Relative Name and Phone Number:       Current Level of Care: Hospital Recommended Level of Care: [place] Prior Approval Number:    Date Approved/Denied:   PASRR Number:    Discharge Plan: SNF    Current Diagnoses: Patient Active Problem List   Diagnosis Date Noted  . Spinal stenosis, lumbar region, with neurogenic claudication 08/04/2015  . Calcium blood increased 02/18/2015  . Bradycardia 01/26/2015  . Chronic kidney disease (CKD), stage III (moderate) 01/26/2015  . Diabetes mellitus ([place]) 01/26/2015  . Benign prostatic hypertrophy without urinary obstruction 01/25/2015  . Obstructive apnea 01/25/2015  . Type 2 diabetes mellitus ([place]) 01/25/2015  . Bipolar I disorder ([place]) 01/24/2015  . Has a tremor 01/13/2015  . ED (erectile dysfunction) of organic origin 03/19/2011  . Microscopic colitis 01/15/2008    Class: Question of  . DIABETES MELLITUS 10/23/2007  . HYPERLIPIDEMIA 10/23/2007  . OBESITY 10/23/2007  . ANXIETY 10/23/2007  . DEPRESSION 10/23/2007  . HYPERTENSION 10/23/2007  . Asthma 10/23/2007  . Sleep apnea 10/23/2007  . Adiposity 10/23/2007  . Anxiety state 10/23/2007    Orientation RESPIRATION BLADDER Height & Weight    Self, Time, Situation, Place  Normal Continent 5\' 7"  (170.2 cm) 242 lbs.  BEHAVIORAL SYMPTOMS/MOOD NEUROLOGICAL BOWEL NUTRITION STATUS  Other (Comment) (no behaviors)   Continent Diet (carb modified)  AMBULATORY STATUS COMMUNICATION OF NEEDS Skin   Extensive Assist Verbally Surgical wounds                        Personal Care Assistance Level of Assistance  Bathing, Feeding, Dressing Bathing Assistance: Limited assistance Feeding assistance: Independent Dressing Assistance: Limited assistance     Functional Limitations Info  Sight, Hearing, Speech Sight Info: Adequate Hearing Info: Adequate Speech Info: Adequate    SPECIAL CARE FACTORS FREQUENCY  PT (By licensed PT), OT (By licensed OT)     PT Frequency: 5 x wk OT Frequency: 5 x wk            Contractures Contractures Info: Not present    Additional Factors Info  Code Status, Psychotropic, Insulin Sliding Scale Code Status Info: Full Code             Current Medications (08/06/2015):  This is the current hospital active medication list Current Facility-Administered Medications  Medication Dose Route Frequency Provider Last Rate Last Dose  . acetaminophen (TYLENOL) tablet 650 mg  650 mg Oral Q4H PRN Latanya Maudlin, MD       Or  . acetaminophen (TYLENOL) suppository 650 mg  650 mg Rectal Q4H PRN Latanya Maudlin, MD      . albuterol (PROVENTIL) (2.5 MG/3ML) 0.083% nebulizer solution 3 mL  3 mL Inhalation Q4H PRN Latanya Maudlin, MD      . amLODipine (NORVASC) tablet 10 mg  10 mg Oral Daily Latanya Maudlin, MD   10 mg at 08/05/15 1658  . antiseptic oral rinse (CPC / CETYLPYRIDINIUM CHLORIDE 0.05%) solution 7 mL  7 mL Mouth Rinse BID Latanya Maudlin, MD   7 mL at 08/06/15 0924  .  aspirin tablet 325 mg  325 mg Oral Daily Latanya Maudlin, MD   325 mg at 08/06/15 [id number]  . atorvastatin (LIPITOR) tablet 10 mg  10 mg Oral Daily Latanya Maudlin, MD   10 mg at 08/05/15 1142  . bisacodyl (DULCOLAX) EC tablet 5 mg  5 mg Oral Daily PRN Latanya Maudlin, MD      . Derrill Memo ON 08/13/2015] cyanocobalamin ((VITAMIN B-12)) injection 1,000 mcg  1,000 mcg Intramuscular Q30 days Latanya Maudlin, MD      . divalproex (DEPAKOTE ER) 24 hr tablet 1,000 mg  1,000 mg Oral QHS Latanya Maudlin, MD   1,000 mg at 08/05/15 2310  . HYDROcodone-acetaminophen  (NORCO/VICODIN) 5-325 MG per tablet 1 tablet  1 tablet Oral Q6H PRN Latanya Maudlin, MD      . HYDROmorphone (DILAUDID) injection 0.5-1 mg  0.5-1 mg Intravenous Q2H PRN Latanya Maudlin, MD   1 mg at 08/05/15 1310  . insulin aspart (novoLOG) injection 0-15 Units  0-15 Units Subcutaneous TID WC Latanya Maudlin, MD   2 Units at 08/06/15 [phone]  . irbesartan (AVAPRO) tablet 37.5 mg  37.5 mg Oral Daily Latanya Maudlin, MD   37.5 mg at 08/06/15 [id number]  . lactated ringers infusion   Intravenous Continuous Latanya Maudlin, MD 100 mL/hr at 08/06/15 0600    . menthol-cetylpyridinium (CEPACOL) lozenge 3 mg  1 lozenge Oral PRN Latanya Maudlin, MD       Or  . phenol (CHLORASEPTIC) mouth spray 1 spray  1 spray Mouth/Throat PRN Latanya Maudlin, MD      . methocarbamol (ROBAXIN) tablet 500 mg  500 mg Oral Q6H PRN Latanya Maudlin, MD   500 mg at 08/06/15 [id number]   Or  . methocarbamol (ROBAXIN) 500 mg in dextrose 5 % 50 mL IVPB  500 mg Intravenous Q6H PRN Latanya Maudlin, MD   500 mg at 08/04/15 1236  . mometasone-formoterol (DULERA) 100-5 MCG/ACT inhaler 2 puff  2 puff Inhalation BID Latanya Maudlin, MD   2 puff at 08/05/15 1944  . ondansetron (ZOFRAN) injection 4 mg  4 mg Intravenous Q4H PRN Latanya Maudlin, MD   4 mg at 08/05/15 1321  . oxyCODONE-acetaminophen (PERCOCET/ROXICET) 5-325 MG per tablet 1-2 tablet  1-2 tablet Oral Q4H PRN Latanya Maudlin, MD   1 tablet at 08/06/15 [phone]  . pantoprazole (PROTONIX) EC tablet 40 mg  40 mg Oral Daily Latanya Maudlin, MD   40 mg at 08/06/15 [id number]  . polyethylene glycol (MIRALAX / GLYCOLAX) packet 17 g  17 g Oral Daily PRN Latanya Maudlin, MD      . QUEtiapine (SEROQUEL) tablet 50 mg  50 mg Oral QHS Latanya Maudlin, MD   50 mg at 08/05/15 2102  . sertraline (ZOLOFT) tablet 100 mg  100 mg Oral QHS Latanya Maudlin, MD   100 mg at 08/05/15 2102  . sodium phosphate (FLEET) 7-19 GM/118ML enema 1 enema  1 enema Rectal Once PRN Latanya Maudlin, MD      . tamsulosin [facility]) capsule 0.4 mg  0.4 mg Oral  Daily Latanya Maudlin, MD   0.4 mg at 08/05/15 1142  . traZODone (DESYREL) tablet 50 mg  50 mg Oral QHS Latanya Maudlin, MD   50 mg at 08/05/15 2102     Discharge Medications: Please see discharge summary for a list of discharge medications.  Relevant Imaging Results:  Relevant Lab Results:   Additional Information SS # [id number]  Yardley Beltran, Randall An, LCSW

## 2015-08-07 ENCOUNTER — Inpatient Hospital Stay (HOSPITAL_COMMUNITY): Payer: Medicare Other

## 2015-08-07 LAB — GLUCOSE, CAPILLARY
Glucose-Capillary: 138 mg/dL — ABNORMAL HIGH (ref 65–99)
Glucose-Capillary: 168 mg/dL — ABNORMAL HIGH (ref 65–99)
Glucose-Capillary: 140 mg/dL — ABNORMAL HIGH (ref 65–99)
Glucose-Capillary: 120 mg/dL — ABNORMAL HIGH (ref 65–99)

## 2015-08-07 NOTE — Progress Notes (Signed)
Physical Therapy Treatment Patient Details Name: [name] MRN: [id number] DOB: 1941/07/23 Today's Date: 08/07/2015    History of Present Illness pt is s/p L4-5 decompression.  Post sx, he was confused and worked up for CVA, which was negative.    PT Comments    Patient  Improved in mobility but does require extensive assistance for mobility. Recommend SNF for rehab.   Follow Up Recommendations  SNF;Supervision/Assistance - 24 hour     Equipment Recommendations  None recommended by PT    Recommendations for Other Services       Precautions / Restrictions Precautions Precautions: Fall;Back    Mobility  Bed Mobility Overal bed mobility: Needs Assistance;+ 2 for safety/equipment;+2 for physical assistance Bed Mobility: Rolling;Supine to Sit Rolling: +2 for physical assistance;Max assist;+2 for safety/equipment       Sit to sidelying: Max assist;+2 for physical assistance;+2 for safety/equipment;HOB elevated General bed mobility comments: used HOB to assist sitting upright after rolling  Transfers Overall transfer level: Needs assistance Equipment used: Rolling walker (2 wheeled) Transfers: Sit to/from Stand Sit to Stand: Mod assist;From elevated surface;+2 physical assistance;+2 safety/equipment         General transfer comment: cues for hand placement and to use LEs to power up  Ambulation/Gait Ambulation/Gait assistance: +2 physical assistance;+2 safety/equipment;Mod assist Ambulation Distance (Feet): 12 Feet Assistive device: Rolling walker (2 wheeled) Gait Pattern/deviations: Step-to pattern;Step-through pattern;Trunk flexed;Shuffle     General Gait Details: Improveed balance today, assist with  maneuvering RW; multi-modal cues for posture, step length and safety   Stairs            Wheelchair Mobility    Modified Rankin (Stroke Patients Only)       Balance   Sitting-balance support: Feet supported;Bilateral upper extremity  supported Sitting balance-Leahy Scale: Fair Sitting balance - Comments: improved sitting up   Standing balance support: During functional activity;Bilateral upper extremity supported Standing balance-Leahy Scale: Poor                      Cognition Arousal/Alertness: Awake/alert Behavior During Therapy: WFL for tasks assessed/performed Overall Cognitive Status: Impaired/Different from baseline Area of Impairment: Problem solving;Orientation     Memory: Decreased recall of precautions       Problem Solving: Slow processing;Difficulty sequencing;Requires verbal cues      Exercises      General Comments        Pertinent Vitals/Pain Pain Score: 8  Pain Location: back Pain Descriptors / Indicators: Aching;Discomfort Pain Intervention(s): Limited activity within patient's tolerance;Monitored during session;Repositioned;Premedicated before session    Home Living                      Prior Function            PT Goals (current goals can now be found in the care plan section) Progress towards PT goals: Progressing toward goals    Frequency  Min 4X/week    PT Plan Current plan remains appropriate    Co-evaluation             End of Session Equipment Utilized During Treatment: Gait belt Activity Tolerance: Patient tolerated treatment well Patient left: in chair;with call bell/phone within reach;with chair alarm set;with family/visitor present     Time: [id number] PT Time Calculation (min) (ACUTE ONLY): 17 min  Charges:  $Gait Training: 8-22 mins                    G  Codes:      [name] 08/07/2015, 12:58 PM

## 2015-08-07 NOTE — Progress Notes (Signed)
Physical Therapy Treatment Patient Details Name: [name] MRN: [id number] DOB: [birth year] Today's Date: 08/07/2015    History of Present Illness pt is s/p L4-5 decompression.  Post sx, he was confused and worked up for CVA, which was negative.    PT Comments    Continues to require extensive assistance for mobility.  Follow Up Recommendations  SNF;Supervision/Assistance - 24 hour     Equipment Recommendations  None recommended by PT    Recommendations for Other Services       Precautions / Restrictions Precautions Precautions: Fall;Back    Mobility  Bed Mobility Overal bed mobility: Needs Assistance;+ 2 for safety/equipment;+2 for physical assistance Bed Mobility: Sit to Sidelying Rolling: +2 for physical assistance;Max assist;+2 for safety/equipment       Sit to sidelying: +2 for physical assistance;+2 for safety/equipment;Max assist General bed mobility comments: multimodal cues for  arm position, assist for trunk and for legs back onto bed.  Transfers Overall transfer level: Needs assistance Equipment used: Rolling walker (2 wheeled) Transfers: Sit to/from Stand Sit to Stand: Mod assist;+2 physical assistance;+2 safety/equipment         General transfer comment: multimodal cues for hand placement., Hand over hand guidance to push up, pivot steps back to the bed, requires assist to scoot to edge of the chair prior to standing up.  Ambulation/Gait Ambulation/Gait assistance: +2 physical assistance;+2 safety/equipment;Mod assist Ambulation Distance (Feet): 12 Feet Assistive device: Rolling walker (2 wheeled) Gait Pattern/deviations: Step-to pattern;Step-through pattern;Trunk flexed;Shuffle     General Gait Details: Improveed balance today, assist with  maneuvering RW; multi-modal cues for posture, step length and safety   Stairs            Wheelchair Mobility    Modified Rankin (Stroke Patients Only)       Balance   Sitting-balance  support: Feet supported;Bilateral upper extremity supported Sitting balance-Leahy Scale: Fair Sitting balance - Comments: improved sitting up   Standing balance support: During functional activity;Bilateral upper extremity supported Standing balance-Leahy Scale: Poor                      Cognition Arousal/Alertness: Awake/alert Behavior During Therapy: WFL for tasks assessed/performed Overall Cognitive Status: Impaired/Different from baseline Area of Impairment: Problem solving;Orientation     Memory: Decreased recall of precautions       Problem Solving: Slow processing;Difficulty sequencing;Requires verbal cues      Exercises      General Comments        Pertinent Vitals/Pain Pain Score: 8  Pain Location: back Pain Descriptors / Indicators: Aching;Grimacing;Guarding Pain Intervention(s): Limited activity within patient's tolerance;Monitored during session;Repositioned    Home Living                      Prior Function            PT Goals (current goals can now be found in the care plan section) Progress towards PT goals: Progressing toward goals    Frequency  Min 4X/week    PT Plan Current plan remains appropriate    Co-evaluation             End of Session Equipment Utilized During Treatment: Gait belt Activity Tolerance: Patient tolerated treatment well Patient left: in bed;with call bell/phone within reach;with bed alarm set     Time: [id number] PT Time Calculation (min) (ACUTE ONLY): 13 min  Charges:  $Gait Training: 8-22 mins $Therapeutic Activity: 8-22 mins  G Codes:      [name] 08/07/2015, 2:05 PM

## 2015-08-07 NOTE — Progress Notes (Signed)
CSW assisting with d/c planning. PASRR # received. Planning for [place] when stable for d/c. CSW will know on Monday if [facility] has availability. Pt has additional bed offers if needed.  Werner Lean LCSW [phone]

## 2015-08-07 NOTE — Progress Notes (Signed)
Subjective: 3 Days Post-Op Procedure(s) (LRB): CENTRAL DECOMPRESSION  LUMBAR LAMINECTOMY L4-L5  (N/A) Patient reports pain as 2 on 0-10 scale.  Vomited some Bile-like material this morning. Abdomen is soft. Will get Abdominal films.Up with therapy and SNF planned for Monday.  Objective: Vital signs in last 24 hours: Temp:  [97.8 F (36.6 C)-98.8 F (37.1 C)] 98.8 F (37.1 C) (12/24 0607) Pulse Rate:  [63-77] 77 (12/24 0607) Resp:  [14-18] 18 (12/24 0607) BP: (114-143)/(61-74) 143/74 mmHg (12/24 0607) SpO2:  [92 %-95 %] 92 % (12/24 0607)  Intake/Output from previous day: 12/23 0701 - 12/24 0700 In: 680 [P.O.:480; I.V.:200] Out: 100 [Urine:100] Intake/Output this shift:    No results for input(s): HGB in the last 72 hours. No results for input(s): WBC, RBC, HCT, PLT in the last 72 hours.  Recent Labs  08/05/15 1114  NA 140  K 4.1  CL 104  CO2 27  BUN 20  CREATININE 1.45*  GLUCOSE 187*  CALCIUM 8.7*   No results for input(s): LABPT, INR in the last 72 hours.  Dorsiflexion/Plantar flexion intact  Assessment/Plan: 3 Days Post-Op Procedure(s) (LRB): CENTRAL DECOMPRESSION  LUMBAR LAMINECTOMY L4-L5  (N/A) Up with therapy.Xray Abdomen ordered.   [name] A 08/07/2015, 7:23 AM

## 2015-08-08 LAB — GLUCOSE, CAPILLARY
Glucose-Capillary: 149 mg/dL — ABNORMAL HIGH (ref 65–99)
Glucose-Capillary: 112 mg/dL — ABNORMAL HIGH (ref 65–99)
Glucose-Capillary: 176 mg/dL — ABNORMAL HIGH (ref 65–99)
Glucose-Capillary: 138 mg/dL — ABNORMAL HIGH (ref 65–99)

## 2015-08-08 NOTE — Progress Notes (Signed)
Bladder scanned for 900 cc.had voided 100-200 cc earlier and incont large amt at 2300.Foley inserted and clamped after 1000 cc drained.Report given to Ardae RN at 0700.Pt tol procedure well.Has not been able to stand to void,and is difficult to turn in bed. Aggie Moats D

## 2015-08-08 NOTE — Progress Notes (Signed)
   Subjective: 4 Days Post-Op Procedure(s) (LRB): CENTRAL DECOMPRESSION  LUMBAR LAMINECTOMY L4-L5  (N/A)  Pt feeling a little better today abd feels much better after some BM activity yesterday X-rays were negative yesterday Still c/o mild to moderate pain/stiffness in the lumbar region Patient reports pain as moderate.  Objective:   VITALS:   Filed Vitals:   08/07/15 2145 08/08/15 0525  BP: 136/65 153/74  Pulse: 67 75  Temp: 98.1 F (36.7 C) 98.9 F (37.2 C)  Resp: 18 18    Lumbar incision healing well nv intact distally bilaterally No rashes or edema  LABS No results for input(s): HGB, HCT, WBC, PLT in the last 72 hours.   Recent Labs  08/05/15 1114  NA 140  K 4.1  BUN 20  CREATININE 1.45*  GLUCOSE 187*     Assessment/Plan: 4 Days Post-Op Procedure(s) (LRB): CENTRAL DECOMPRESSION  LUMBAR LAMINECTOMY L4-L5  (N/A) Continue therapy Plan for SNF placement hopefully tomorrow Pain management as needed    Merla Riches, MPAS, PA-C  08/08/2015, 8:10 AM

## 2015-08-08 NOTE — Progress Notes (Signed)
Needs much enc to move at all in the bed.has to be turned and unable to get up even on side of bed.Turning q 2hrs and re-positioning.Condom cath applied for comfort d/t incontinence. Aggie Moats D

## 2015-08-09 DIAGNOSIS — R5381 Other malaise: Secondary | ICD-10-CM | POA: Diagnosis not present

## 2015-08-09 DIAGNOSIS — R262 Difficulty in walking, not elsewhere classified: Secondary | ICD-10-CM | POA: Diagnosis not present

## 2015-08-09 DIAGNOSIS — E119 Type 2 diabetes mellitus without complications: Secondary | ICD-10-CM | POA: Diagnosis not present

## 2015-08-09 DIAGNOSIS — N183 Chronic kidney disease, stage 3 (moderate): Secondary | ICD-10-CM | POA: Diagnosis not present

## 2015-08-09 DIAGNOSIS — G8918 Other acute postprocedural pain: Secondary | ICD-10-CM | POA: Diagnosis not present

## 2015-08-09 DIAGNOSIS — M4800 Spinal stenosis, site unspecified: Secondary | ICD-10-CM | POA: Diagnosis not present

## 2015-08-09 DIAGNOSIS — F0390 Unspecified dementia without behavioral disturbance: Secondary | ICD-10-CM | POA: Diagnosis not present

## 2015-08-09 DIAGNOSIS — Z4789 Encounter for other orthopedic aftercare: Secondary | ICD-10-CM | POA: Diagnosis not present

## 2015-08-09 DIAGNOSIS — E669 Obesity, unspecified: Secondary | ICD-10-CM | POA: Diagnosis not present

## 2015-08-09 DIAGNOSIS — M4806 Spinal stenosis, lumbar region: Secondary | ICD-10-CM | POA: Diagnosis not present

## 2015-08-09 DIAGNOSIS — M5442 Lumbago with sciatica, left side: Secondary | ICD-10-CM | POA: Diagnosis not present

## 2015-08-09 DIAGNOSIS — N400 Enlarged prostate without lower urinary tract symptoms: Secondary | ICD-10-CM | POA: Diagnosis not present

## 2015-08-09 LAB — GLUCOSE, CAPILLARY
Glucose-Capillary: 118 mg/dL — ABNORMAL HIGH (ref 65–99)
Glucose-Capillary: 193 mg/dL — ABNORMAL HIGH (ref 65–99)

## 2015-08-09 MED ORDER — ASPIRIN 325 MG PO TABS: 325.0000 mg | ORAL_TABLET | Freq: Every day | ORAL | Status: DC

## 2015-08-09 NOTE — Clinical Social Work Placement (Signed)
   CLINICAL SOCIAL WORK PLACEMENT  NOTE  Date:  08/09/2015  Patient Details  Name: [name] MRN: [id number] Date of Birth: 31-May-1941  Clinical Social Work is seeking post-discharge placement for this patient at the [place] level of care (*CSW will initial, date and re-position this form in  chart as items are completed):  Yes   Patient/family provided with [place] Work Department's list of facilities offering this level of care within the geographic area requested by the patient (or if unable, by the patient's family).      Patient/family informed of their freedom to choose among providers that offer the needed level of care, that participate in Medicare, Medicaid or managed care program needed by the patient, have an available bed and are willing to accept the patient.  Yes   Patient/family informed of [facility] [place]'s ownership interest in [facility] and [facility], as well as of the fact that they are under no obligation to receive care at these facilities.  PASRR submitted to [facility] on 08/06/15     PASRR number received on 08/08/15     Existing PASRR number confirmed on       FL2 transmitted to all facilities in geographic area requested by pt/family on 08/08/15     FL2 transmitted to all facilities within larger geographic area on       Patient informed that his/her managed care company has contracts with or will negotiate with certain facilities, including the following:        Yes   Patient/family informed of bed offers received.  Patient chooses bed at [facility], [facility]     Physician recommends and patient chooses bed at      Patient to be transferred to   on 08/09/15.  Patient to be transferred to facility by ambulance [name])     Patient family notified on 08/09/15 of transfer.  Name of family member notified:  pt notified at beside and pt fiance, [name] notified via telehpone.     PHYSICIAN       Additional  Comment:    _______________________________________________ Ladell Pier, LCSW 08/09/2015, 11:17 AM

## 2015-08-09 NOTE — Progress Notes (Signed)
Report called to Dorian Heckle LPN at [facility]. Questions answered

## 2015-08-09 NOTE — Progress Notes (Signed)
Physical Therapy Treatment Patient Details Name: NED FIORETTI MRN: [id number] DOB: 1941-02-28 Today's Date: 08/09/2015    History of Present Illness pt is s/p L4-5 decompression.  Post sx, he was confused and worked up for CVA, which was negative.    PT Comments    POD # 5 Assisted OOB + 2 assist and increased time.  Difficulty "log rolling" and performing sidelying to sit.  Requires + 2 assist for safety.  Limited amb distance due to max c/o faitgue.  Pt reports low activity level prior to surgery.  Positioned in recliner upright with ICE pack to incision.    Follow Up Recommendations  SNF     Equipment Recommendations       Recommendations for Other Services       Precautions / Restrictions Precautions Precautions: Fall;Back Precaution Comments: back Restrictions Weight Bearing Restrictions: No    Mobility  Bed Mobility Overal bed mobility: Needs Assistance;+ 2 for safety/equipment;+2 for physical assistance Bed Mobility: Rolling;Sidelying to Sit Rolling: +2 for physical assistance;Max assist;+2 for safety/equipment Sidelying to sit: Max assist;+2 for physical assistance;+2 for safety/equipment       General bed mobility comments: 75% VC's on proper "log roll" tech and physical assist esp with upper body.    Transfers Overall transfer level: Needs assistance Equipment used: Rolling walker (2 wheeled) Transfers: Sit to/from Stand Sit to Stand: Mod assist;+2 physical assistance;+2 safety/equipment         General transfer comment: 75% VC's on proper tech esp with hand placement.  Required increased time.  difficulty self performing due to ABD BMI.    Ambulation/Gait Ambulation/Gait assistance: Min assist;Mod assist;+2 safety/equipment Ambulation Distance (Feet): 9 Feet Assistive device: Rolling walker (2 wheeled) Gait Pattern/deviations: Step-to pattern;Decreased step length - left;Decreased stance time - right Gait velocity: decreased   General Gait  Details: initial posterior lean plus very slow gait with small shuffled steps.  Limited activity tolerance with max c/o fatigue from pt.     Stairs            Wheelchair Mobility    Modified Rankin (Stroke Patients Only)       Balance                                    Cognition Arousal/Alertness: Awake/alert Behavior During Therapy: WFL for tasks assessed/performed Overall Cognitive Status: Within Functional Limits for tasks assessed                      Exercises      General Comments        Pertinent Vitals/Pain Pain Assessment: 0-10 Pain Score: 8  Pain Location: back Pain Descriptors / Indicators: Discomfort;Grimacing;Sore;Stabbing Pain Intervention(s): Monitored during session;Repositioned;Ice applied    Home Living                      Prior Function            PT Goals (current goals can now be found in the care plan section) Progress towards PT goals: Progressing toward goals    Frequency  Min 4X/week    PT Plan Current plan remains appropriate    Co-evaluation             End of Session Equipment Utilized During Treatment: Gait belt Activity Tolerance: Patient limited by fatigue;Patient limited by pain Patient left: in chair;with call bell/phone within reach  Time: 1030-1050 PT Time Calculation (min) (ACUTE ONLY): 20 min  Charges:  $Gait Training: 8-22 mins                    G Codes:      Rica Koyanagi  PTA [facility]  Acute  Rehab Pager      [phone]

## 2015-08-09 NOTE — Discharge Summary (Signed)
Physician Discharge Summary  Patient ID: ABOU PRICHETT MRN: [id number] DOB/AGE: 01-14-1941 74 y.o.  Admit date: 08/04/2015 Discharge date: 08/09/2015  Admission Diagnoses:Lumbar Spinal Stenosis  Discharge Diagnoses: Lumbar Spinal Stenosis Active Problems:   Spinal stenosis, lumbar region, with neurogenic claudication   Discharged Condition: good  Hospital Course: He became very confused and was evaluated for a CVA. Neurology said this was not a stroke. He had a Normal CT Brain scan and a Normal MRI of his Brain.  Consults: neurology  Significant Diagnostic Studies: radiology: MRI: MRI and CT oof his Brain. and CT scan: Normal  Treatments: antibiotics: Ancef  Discharge Exam: Blood pressure 110/56, pulse 55, temperature 97.6 F (36.4 C), temperature source Oral, resp. rate 18, height 5\' 7"  (1.702 m), weight 109.8 kg (242 lb 1 oz), SpO2 99 %. Extremities: weakness of his Left Lowwer,Pre-Op.  Disposition: 01-Home or Self Care  Discharge Instructions    Call MD / Call 911    Complete by:  As directed   If you experience chest pain or shortness of breath, CALL 911 and be transported to the hospital emergency room.  If you develope a fever above 101 F, pus ([place] drainage) or increased drainage or redness at the wound, or calf pain, call your surgeon's office.     Constipation Prevention    Complete by:  As directed   Drink plenty of fluids.  Prune juice may be helpful.  You may use a stool softener, such as Colace (over the counter) 100 mg twice a day.  Use MiraLax (over the counter) for constipation as needed.     Diet - low sodium heart healthy    Complete by:  As directed      Diet Carb Modified    Complete by:  As directed      Discharge instructions    Complete by:  As directed   No lifting or bending For the first few days, remove your dressing, tape a piece of saran wrap over your incision.  Take your shower, then remove the saran wrap and put a clean dressing  on. After three days you can shower without the saran wrap.  Take aspirin 325mg  daily to prevent blood clots Call Dr. Gladstone Lighter if any wound complications or temperature of 101 degrees F or over.  Call the office for an appointment to see Dr. Gladstone Lighter in two weeks: [phone] and ask for Dr. Charlestine Night nurse, Brunilda Payor.     Driving restrictions    Complete by:  As directed   No driving while taking pain medications     Increase activity slowly as tolerated    Complete by:  As directed      Lifting restrictions    Complete by:  As directed   No lifting            Medication List    STOP taking these medications        aspirin 81 MG tablet     HYDROcodone-acetaminophen 5-325 MG tablet  Commonly known as:  NORCO/VICODIN      TAKE these medications        amLODipine 10 MG tablet  Commonly known as:  NORVASC  Take 10 mg by mouth daily.     atorvastatin 10 MG tablet  Commonly known as:  LIPITOR  Take 10 mg by mouth daily.     bismuth subsalicylate 99991111 [id number] suspension  Commonly known as:  PEPTO BISMOL  Take 30 mLs by mouth 2 (two) times daily  as needed for indigestion.     cyanocobalamin 1000 MCG/ML injection  Commonly known as:  (VITAMIN B-12)  Inject 1,000 mcg into the muscle every 30 (thirty) days.     divalproex 500 MG 24 hr tablet  Commonly known as:  DEPAKOTE ER  Take 1,000 mg by mouth at bedtime.     Fluticasone-Salmeterol 250-50 MCG/DOSE Aepb  Commonly known as:  ADVAIR  Inhale 1 puff into the lungs daily.     memantine 10 MG tablet  Commonly known as:  NAMENDA  Take 10 mg by mouth 2 (two) times daily.     methocarbamol 500 MG tablet  Commonly known as:  ROBAXIN  Take 1 tablet (500 mg total) by mouth every 6 (six) hours as needed for muscle spasms.     MULTIVITAMIN PO  Take 1 tablet by mouth daily.     omeprazole 40 MG capsule  Commonly known as:  PRILOSEC  Take 40 mg by mouth at bedtime as needed (heartburn).     oxyCODONE-acetaminophen  5-325 MG tablet  Commonly known as:  PERCOCET/ROXICET  Take 1-2 tablets by mouth every 4 (four) hours as needed for moderate pain.     PROAIR HFA 108 (90 BASE) MCG/ACT inhaler  Generic drug:  albuterol  Inhale 2 puffs into the lungs every 4 (four) hours as needed for wheezing or shortness of breath.     QUEtiapine 50 MG tablet  Commonly known as:  SEROQUEL  Take 50 mg by mouth at bedtime.     sertraline 100 MG tablet  Commonly known as:  ZOLOFT  Take 100 mg by mouth at bedtime.     sitaGLIPtin 100 MG tablet  Commonly known as:  JANUVIA  Take 100 mg by mouth daily. Takes 1/2 tab     tamsulosin 0.4 MG Caps capsule  Commonly known as:  FLOMAX  Take 0.4 mg by mouth daily.     temazepam 15 MG capsule  Commonly known as:  RESTORIL  Take 15 mg by mouth at bedtime.     traZODone 50 MG tablet  Commonly known as:  DESYREL  Take 50 mg by mouth at bedtime.     TUMS PO  Take 4 tablets by mouth 4 (four) times daily as needed (heartburn).     valsartan 80 MG tablet  Commonly known as:  DIOVAN  Take 80 mg by mouth daily.           Follow-up Information    Follow up with [facility].   Contact information:   [address] [place] [address] [phone]       Follow up with Lexi Conaty A, MD. Schedule an appointment as soon as possible for a visit in 2 weeks.   Specialty:  Orthopedic Surgery   Contact information:   [address] [place] [address] [id number]       Signed: Tobi Bastos 08/09/2015, 9:01 AM  Lumbar Spinal Stenosis

## 2015-08-09 NOTE — Progress Notes (Addendum)
CSW continuing to follow.   CSW received handoff that pt requesting [facility].   Per MD, pt medically ready today.  CSW contacted [facility] and confirmed that facility could accept pt today.  CSW discussed with pt at bedside and pt fiance, [name] via telephone who are both in agreement to [employer].   CSW facilitated pt discharge needs including contacting facility, faxing pt discharge information via [facility], discussing with pt at bedside and pt fiance via telephone, providing RN phone number to call report,and arranged ambulance transport for pt to [facility].  No further social work needs identified at this time.  CSW signing off.   Alison Murray, MSW, LCSW Clinical Social Work Coverage for [employer], [place]

## 2015-08-09 NOTE — Care Management Note (Signed)
Case Management Note  Patient Details  Name: NIMIT MELNIKOV MRN: [id number] Date of Birth: October 14, 1940  Subjective/Objective:    S/p decompressive lumbar laminectomy at L4-5 for spinal stenosis.                Action/Plan: Discharge planning per CSW  Expected Discharge Date:  08/05/15               Expected Discharge Plan:  [place]  In-House Referral:  Clinical Social Work  Discharge planning Services  CM Consult  Post Acute Care Choice:  NA Choice offered to:  NA  DME Arranged:  N/A DME Agency:  NA  HH Arranged:  NA HH Agency:  NA  Status of Service:  Completed, signed off  Medicare Important Message Given:    Date Medicare IM Given:    Medicare IM give by:    Date Additional Medicare IM Given:    Additional Medicare Important Message give by:     If discussed at [place] of Stay Meetings, dates discussed:    Additional Comments:  Guadalupe Maple, RN 08/09/2015, 10:51 AM

## 2015-08-09 NOTE — Progress Notes (Signed)
Patient ID: [name], male   DOB: [birth year], 74 y.o.   MRN: [id number]    Subjective: 5 Days Post-Op Procedure(s) (LRB): CENTRAL DECOMPRESSION  LUMBAR LAMINECTOMY L4-L5  (N/A) Patient reports pain as 9 on 0-10 scale.   Denies CP or SOB.  Voiding without difficulty. Positive flatus. Objective: Vital signs in last 24 hours: Temp:  [97.6 F (36.4 C)-98.7 F (37.1 C)] 97.6 F (36.4 C) (12/26 0554) Pulse Rate:  [53-72] 55 (12/26 0554) Resp:  [18] 18 (12/26 0554) BP: (101-142)/(48-66) 110/56 mmHg (12/26 0554) SpO2:  [93 %-99 %] 99 % (12/26 0554)  Intake/Output from previous day: 12/25 0701 - 12/26 0700 In: 840 [P.O.:840] Out: 2250 [Urine:2250] Intake/Output this shift:    Labs: No results for input(s): HGB in the last 72 hours. No results for input(s): WBC, RBC, HCT, PLT in the last 72 hours. No results for input(s): NA, K, CL, CO2, BUN, CREATININE, GLUCOSE, CALCIUM in the last 72 hours. No results for input(s): LABPT, INR in the last 72 hours.  Physical Exam: Neurologically intact ABD soft Sensation intact distally Incision: dressing C/D/I Compartment soft  Assessment/Plan: 5 Days Post-Op Procedure(s) (LRB): CENTRAL DECOMPRESSION  LUMBAR LAMINECTOMY L4-L5  (N/A) Up with therapy  Plan is for the pt to go to a SNF Dr. Darnell Level called the floor and will be in to check on pt today Pt was just given pain medication  [place], Darla Lesches for Dr. Melina Schools [facility] [phone] 08/09/2015, 8:29 AM

## 2015-08-11 DIAGNOSIS — F0390 Unspecified dementia without behavioral disturbance: Secondary | ICD-10-CM | POA: Diagnosis not present

## 2015-08-11 DIAGNOSIS — G8918 Other acute postprocedural pain: Secondary | ICD-10-CM | POA: Diagnosis not present

## 2015-08-11 DIAGNOSIS — R262 Difficulty in walking, not elsewhere classified: Secondary | ICD-10-CM | POA: Diagnosis not present

## 2015-08-11 DIAGNOSIS — M4806 Spinal stenosis, lumbar region: Secondary | ICD-10-CM | POA: Diagnosis not present

## 2015-08-19 DIAGNOSIS — M5442 Lumbago with sciatica, left side: Secondary | ICD-10-CM | POA: Diagnosis not present

## 2015-08-19 DIAGNOSIS — Z4789 Encounter for other orthopedic aftercare: Secondary | ICD-10-CM | POA: Diagnosis not present

## 2015-08-29 DIAGNOSIS — Z4789 Encounter for other orthopedic aftercare: Secondary | ICD-10-CM | POA: Diagnosis not present

## 2015-08-29 DIAGNOSIS — I129 Hypertensive chronic kidney disease with stage 1 through stage 4 chronic kidney disease, or unspecified chronic kidney disease: Secondary | ICD-10-CM | POA: Diagnosis not present

## 2015-08-29 DIAGNOSIS — Z7984 Long term (current) use of oral hypoglycemic drugs: Secondary | ICD-10-CM | POA: Diagnosis not present

## 2015-08-29 DIAGNOSIS — M1990 Unspecified osteoarthritis, unspecified site: Secondary | ICD-10-CM | POA: Diagnosis not present

## 2015-08-29 DIAGNOSIS — E669 Obesity, unspecified: Secondary | ICD-10-CM | POA: Diagnosis not present

## 2015-08-29 DIAGNOSIS — Z79891 Long term (current) use of opiate analgesic: Secondary | ICD-10-CM | POA: Diagnosis not present

## 2015-08-29 DIAGNOSIS — M519 Unspecified thoracic, thoracolumbar and lumbosacral intervertebral disc disorder: Secondary | ICD-10-CM | POA: Diagnosis not present

## 2015-08-29 DIAGNOSIS — E1122 Type 2 diabetes mellitus with diabetic chronic kidney disease: Secondary | ICD-10-CM | POA: Diagnosis not present

## 2015-08-29 DIAGNOSIS — Z79899 Other long term (current) drug therapy: Secondary | ICD-10-CM | POA: Diagnosis not present

## 2015-08-29 DIAGNOSIS — N189 Chronic kidney disease, unspecified: Secondary | ICD-10-CM | POA: Diagnosis not present

## 2015-08-29 DIAGNOSIS — F0390 Unspecified dementia without behavioral disturbance: Secondary | ICD-10-CM | POA: Diagnosis not present

## 2015-08-29 DIAGNOSIS — F329 Major depressive disorder, single episode, unspecified: Secondary | ICD-10-CM | POA: Diagnosis not present

## 2015-08-29 DIAGNOSIS — R2689 Other abnormalities of gait and mobility: Secondary | ICD-10-CM | POA: Diagnosis not present

## 2015-08-29 DIAGNOSIS — J45909 Unspecified asthma, uncomplicated: Secondary | ICD-10-CM | POA: Diagnosis not present

## 2015-09-01 DIAGNOSIS — G3184 Mild cognitive impairment, so stated: Secondary | ICD-10-CM | POA: Diagnosis not present

## 2015-09-01 DIAGNOSIS — M1990 Unspecified osteoarthritis, unspecified site: Secondary | ICD-10-CM | POA: Diagnosis not present

## 2015-09-01 DIAGNOSIS — M519 Unspecified thoracic, thoracolumbar and lumbosacral intervertebral disc disorder: Secondary | ICD-10-CM | POA: Diagnosis not present

## 2015-09-01 DIAGNOSIS — R569 Unspecified convulsions: Secondary | ICD-10-CM | POA: Diagnosis not present

## 2015-09-01 DIAGNOSIS — R251 Tremor, unspecified: Secondary | ICD-10-CM | POA: Diagnosis not present

## 2015-09-01 DIAGNOSIS — Z4789 Encounter for other orthopedic aftercare: Secondary | ICD-10-CM | POA: Diagnosis not present

## 2015-09-01 DIAGNOSIS — D513 Other dietary vitamin B12 deficiency anemia: Secondary | ICD-10-CM | POA: Diagnosis not present

## 2015-09-01 DIAGNOSIS — I129 Hypertensive chronic kidney disease with stage 1 through stage 4 chronic kidney disease, or unspecified chronic kidney disease: Secondary | ICD-10-CM | POA: Diagnosis not present

## 2015-09-01 DIAGNOSIS — R2689 Other abnormalities of gait and mobility: Secondary | ICD-10-CM | POA: Diagnosis not present

## 2015-09-01 DIAGNOSIS — E1122 Type 2 diabetes mellitus with diabetic chronic kidney disease: Secondary | ICD-10-CM | POA: Diagnosis not present

## 2015-09-02 DIAGNOSIS — M519 Unspecified thoracic, thoracolumbar and lumbosacral intervertebral disc disorder: Secondary | ICD-10-CM | POA: Diagnosis not present

## 2015-09-02 DIAGNOSIS — R2689 Other abnormalities of gait and mobility: Secondary | ICD-10-CM | POA: Diagnosis not present

## 2015-09-02 DIAGNOSIS — M1990 Unspecified osteoarthritis, unspecified site: Secondary | ICD-10-CM | POA: Diagnosis not present

## 2015-09-02 DIAGNOSIS — I129 Hypertensive chronic kidney disease with stage 1 through stage 4 chronic kidney disease, or unspecified chronic kidney disease: Secondary | ICD-10-CM | POA: Diagnosis not present

## 2015-09-02 DIAGNOSIS — E1122 Type 2 diabetes mellitus with diabetic chronic kidney disease: Secondary | ICD-10-CM | POA: Diagnosis not present

## 2015-09-02 DIAGNOSIS — Z4789 Encounter for other orthopedic aftercare: Secondary | ICD-10-CM | POA: Diagnosis not present

## 2015-09-05 DIAGNOSIS — M519 Unspecified thoracic, thoracolumbar and lumbosacral intervertebral disc disorder: Secondary | ICD-10-CM | POA: Diagnosis not present

## 2015-09-05 DIAGNOSIS — E1122 Type 2 diabetes mellitus with diabetic chronic kidney disease: Secondary | ICD-10-CM | POA: Diagnosis not present

## 2015-09-05 DIAGNOSIS — I129 Hypertensive chronic kidney disease with stage 1 through stage 4 chronic kidney disease, or unspecified chronic kidney disease: Secondary | ICD-10-CM | POA: Diagnosis not present

## 2015-09-05 DIAGNOSIS — R2689 Other abnormalities of gait and mobility: Secondary | ICD-10-CM | POA: Diagnosis not present

## 2015-09-05 DIAGNOSIS — Z4789 Encounter for other orthopedic aftercare: Secondary | ICD-10-CM | POA: Diagnosis not present

## 2015-09-05 DIAGNOSIS — M1990 Unspecified osteoarthritis, unspecified site: Secondary | ICD-10-CM | POA: Diagnosis not present

## 2015-09-06 DIAGNOSIS — R2689 Other abnormalities of gait and mobility: Secondary | ICD-10-CM | POA: Diagnosis not present

## 2015-09-06 DIAGNOSIS — Z4789 Encounter for other orthopedic aftercare: Secondary | ICD-10-CM | POA: Diagnosis not present

## 2015-09-06 DIAGNOSIS — E1122 Type 2 diabetes mellitus with diabetic chronic kidney disease: Secondary | ICD-10-CM | POA: Diagnosis not present

## 2015-09-06 DIAGNOSIS — M519 Unspecified thoracic, thoracolumbar and lumbosacral intervertebral disc disorder: Secondary | ICD-10-CM | POA: Diagnosis not present

## 2015-09-06 DIAGNOSIS — M1990 Unspecified osteoarthritis, unspecified site: Secondary | ICD-10-CM | POA: Diagnosis not present

## 2015-09-06 DIAGNOSIS — I129 Hypertensive chronic kidney disease with stage 1 through stage 4 chronic kidney disease, or unspecified chronic kidney disease: Secondary | ICD-10-CM | POA: Diagnosis not present

## 2015-09-08 DIAGNOSIS — Z4789 Encounter for other orthopedic aftercare: Secondary | ICD-10-CM | POA: Diagnosis not present

## 2015-09-08 DIAGNOSIS — I129 Hypertensive chronic kidney disease with stage 1 through stage 4 chronic kidney disease, or unspecified chronic kidney disease: Secondary | ICD-10-CM | POA: Diagnosis not present

## 2015-09-08 DIAGNOSIS — R2689 Other abnormalities of gait and mobility: Secondary | ICD-10-CM | POA: Diagnosis not present

## 2015-09-08 DIAGNOSIS — E1122 Type 2 diabetes mellitus with diabetic chronic kidney disease: Secondary | ICD-10-CM | POA: Diagnosis not present

## 2015-09-08 DIAGNOSIS — M1990 Unspecified osteoarthritis, unspecified site: Secondary | ICD-10-CM | POA: Diagnosis not present

## 2015-09-08 DIAGNOSIS — M519 Unspecified thoracic, thoracolumbar and lumbosacral intervertebral disc disorder: Secondary | ICD-10-CM | POA: Diagnosis not present

## 2015-09-13 DIAGNOSIS — Z4789 Encounter for other orthopedic aftercare: Secondary | ICD-10-CM | POA: Diagnosis not present

## 2015-09-16 DIAGNOSIS — R2689 Other abnormalities of gait and mobility: Secondary | ICD-10-CM | POA: Diagnosis not present

## 2015-09-16 DIAGNOSIS — M1990 Unspecified osteoarthritis, unspecified site: Secondary | ICD-10-CM | POA: Diagnosis not present

## 2015-09-16 DIAGNOSIS — I129 Hypertensive chronic kidney disease with stage 1 through stage 4 chronic kidney disease, or unspecified chronic kidney disease: Secondary | ICD-10-CM | POA: Diagnosis not present

## 2015-09-16 DIAGNOSIS — E1122 Type 2 diabetes mellitus with diabetic chronic kidney disease: Secondary | ICD-10-CM | POA: Diagnosis not present

## 2015-09-16 DIAGNOSIS — M519 Unspecified thoracic, thoracolumbar and lumbosacral intervertebral disc disorder: Secondary | ICD-10-CM | POA: Diagnosis not present

## 2015-09-16 DIAGNOSIS — Z4789 Encounter for other orthopedic aftercare: Secondary | ICD-10-CM | POA: Diagnosis not present

## 2015-09-18 DIAGNOSIS — R402411 Glasgow coma scale score 13-15, in the field [EMT or ambulance]: Secondary | ICD-10-CM | POA: Diagnosis not present

## 2015-09-18 DIAGNOSIS — R4182 Altered mental status, unspecified: Secondary | ICD-10-CM | POA: Diagnosis not present

## 2015-09-18 DIAGNOSIS — I129 Hypertensive chronic kidney disease with stage 1 through stage 4 chronic kidney disease, or unspecified chronic kidney disease: Secondary | ICD-10-CM | POA: Diagnosis not present

## 2015-09-18 DIAGNOSIS — E1122 Type 2 diabetes mellitus with diabetic chronic kidney disease: Secondary | ICD-10-CM | POA: Diagnosis not present

## 2015-09-18 DIAGNOSIS — Z4789 Encounter for other orthopedic aftercare: Secondary | ICD-10-CM | POA: Diagnosis not present

## 2015-09-18 DIAGNOSIS — M1990 Unspecified osteoarthritis, unspecified site: Secondary | ICD-10-CM | POA: Diagnosis not present

## 2015-09-18 DIAGNOSIS — R0602 Shortness of breath: Secondary | ICD-10-CM | POA: Diagnosis not present

## 2015-09-18 DIAGNOSIS — M519 Unspecified thoracic, thoracolumbar and lumbosacral intervertebral disc disorder: Secondary | ICD-10-CM | POA: Diagnosis not present

## 2015-09-18 DIAGNOSIS — R2689 Other abnormalities of gait and mobility: Secondary | ICD-10-CM | POA: Diagnosis not present

## 2015-09-19 DIAGNOSIS — R0602 Shortness of breath: Secondary | ICD-10-CM | POA: Diagnosis not present

## 2015-09-21 DIAGNOSIS — R2689 Other abnormalities of gait and mobility: Secondary | ICD-10-CM | POA: Diagnosis not present

## 2015-09-21 DIAGNOSIS — I129 Hypertensive chronic kidney disease with stage 1 through stage 4 chronic kidney disease, or unspecified chronic kidney disease: Secondary | ICD-10-CM | POA: Diagnosis not present

## 2015-09-21 DIAGNOSIS — Z4789 Encounter for other orthopedic aftercare: Secondary | ICD-10-CM | POA: Diagnosis not present

## 2015-09-21 DIAGNOSIS — E1122 Type 2 diabetes mellitus with diabetic chronic kidney disease: Secondary | ICD-10-CM | POA: Diagnosis not present

## 2015-09-21 DIAGNOSIS — M1990 Unspecified osteoarthritis, unspecified site: Secondary | ICD-10-CM | POA: Diagnosis not present

## 2015-09-21 DIAGNOSIS — M519 Unspecified thoracic, thoracolumbar and lumbosacral intervertebral disc disorder: Secondary | ICD-10-CM | POA: Diagnosis not present

## 2015-09-23 DIAGNOSIS — I129 Hypertensive chronic kidney disease with stage 1 through stage 4 chronic kidney disease, or unspecified chronic kidney disease: Secondary | ICD-10-CM | POA: Diagnosis not present

## 2015-09-23 DIAGNOSIS — F3110 Bipolar disorder, current episode manic without psychotic features, unspecified: Secondary | ICD-10-CM | POA: Diagnosis not present

## 2015-09-23 DIAGNOSIS — E538 Deficiency of other specified B group vitamins: Secondary | ICD-10-CM | POA: Diagnosis not present

## 2015-09-23 DIAGNOSIS — Z4789 Encounter for other orthopedic aftercare: Secondary | ICD-10-CM | POA: Diagnosis not present

## 2015-09-23 DIAGNOSIS — R2689 Other abnormalities of gait and mobility: Secondary | ICD-10-CM | POA: Diagnosis not present

## 2015-09-23 DIAGNOSIS — R8290 Unspecified abnormal findings in urine: Secondary | ICD-10-CM | POA: Diagnosis not present

## 2015-09-23 DIAGNOSIS — R410 Disorientation, unspecified: Secondary | ICD-10-CM | POA: Diagnosis not present

## 2015-09-23 DIAGNOSIS — Z6834 Body mass index (BMI) 34.0-34.9, adult: Secondary | ICD-10-CM | POA: Diagnosis not present

## 2015-09-23 DIAGNOSIS — G3184 Mild cognitive impairment, so stated: Secondary | ICD-10-CM | POA: Diagnosis not present

## 2015-09-23 DIAGNOSIS — M519 Unspecified thoracic, thoracolumbar and lumbosacral intervertebral disc disorder: Secondary | ICD-10-CM | POA: Diagnosis not present

## 2015-09-23 DIAGNOSIS — E1122 Type 2 diabetes mellitus with diabetic chronic kidney disease: Secondary | ICD-10-CM | POA: Diagnosis not present

## 2015-09-23 DIAGNOSIS — M1990 Unspecified osteoarthritis, unspecified site: Secondary | ICD-10-CM | POA: Diagnosis not present

## 2015-09-30 DIAGNOSIS — E1122 Type 2 diabetes mellitus with diabetic chronic kidney disease: Secondary | ICD-10-CM | POA: Diagnosis not present

## 2015-09-30 DIAGNOSIS — I129 Hypertensive chronic kidney disease with stage 1 through stage 4 chronic kidney disease, or unspecified chronic kidney disease: Secondary | ICD-10-CM | POA: Diagnosis not present

## 2015-09-30 DIAGNOSIS — M519 Unspecified thoracic, thoracolumbar and lumbosacral intervertebral disc disorder: Secondary | ICD-10-CM | POA: Diagnosis not present

## 2015-09-30 DIAGNOSIS — M1990 Unspecified osteoarthritis, unspecified site: Secondary | ICD-10-CM | POA: Diagnosis not present

## 2015-09-30 DIAGNOSIS — Z4789 Encounter for other orthopedic aftercare: Secondary | ICD-10-CM | POA: Diagnosis not present

## 2015-09-30 DIAGNOSIS — R2689 Other abnormalities of gait and mobility: Secondary | ICD-10-CM | POA: Diagnosis not present

## 2015-10-06 ENCOUNTER — Encounter: Payer: Self-pay | Admitting: Acute Care

## 2015-10-06 ENCOUNTER — Ambulatory Visit (INDEPENDENT_AMBULATORY_CARE_PROVIDER_SITE_OTHER): Payer: Medicare Other | Admitting: Acute Care

## 2015-10-06 VITALS — BP 130/72 | HR 72 | Ht 68.0 in | Wt 226.2 lb

## 2015-10-06 DIAGNOSIS — G4733 Obstructive sleep apnea (adult) (pediatric): Secondary | ICD-10-CM | POA: Diagnosis not present

## 2015-10-06 NOTE — Patient Instructions (Addendum)
It is nice to meet you today. Your download shows that you are having significant leaks around your CPAP  Mask. You are continuing to have a significant number of apneic events . We place an order for a new machine. We will order a new mask and supplies We can increase the pressure to 16 cm H2O. Continue on CPAP at bedtime.  Goal is to wear for at least 4-6 hours each night for maximal clinical benefit. Continue to work on weight loss, as the link between excess weight  and sleep apnea is well established.  Do not drive if sleepy. Follow up with Dr. Elsworth Soho In 6 weeks or before as needed.  Please contact office for sooner follow up if symptoms do not improve or worsen or seek emergency care

## 2015-10-06 NOTE — Progress Notes (Signed)
Subjective:    Patient ID: [name], male    DOB: Oct 16, 1940, 75 y.o.   MRN: [id number]  HPI 74 year old never smoker retired drug rep seen for   OSA and asthma ,initial pulmonary consult 02/27/13 . CPAP since 1997. Maintained on Advair BID with Pro Air as rescue  Tests/Significant Events: Spirometry 06/2013 (Off altace) nml ratio, FEv1 73% 08/03/16: Back Surgery/ Anesthesia Event 08/09/15-09/23/15 Patient in hospital for surgeries/rehabilitation: CPAP mask does not use during this time  08/2015: Recent diagnosis of Bipolar Depression with new medication management  10/06/15:  Office Visit  Follow up  OSA: Down Load : Pt. Is experiencing significant leaks Worn 42/90 days :47%( Hospitalization/Rehab was not worn) AHI: 43.3 Pressure 10-15 cm H2O  Pt. States that his machine is old and he wants a new machine.He is waking fatigued and is very sleepy, sleeping about 16 hours daily per his wife.She appears very frustrated with his slow recovery from surgery 08/04/15.  His wife is unable to determine if it is related to the new medication regimen for his newly diagnosed  Bipolar Depression.These medications have recently been reduced. They are able to use the CPAP nightly now that the patient is at home, although there is a problem with the mask leaking.They are requesting new machine and supplies.I discussed this patient with Clemetine Marker NP, as she knows his history.    Current outpatient prescriptions:  .  albuterol (PROAIR HFA) 108 (90 BASE) MCG/ACT inhaler, Inhale 2 puffs into the lungs every 4 (four) hours as needed for wheezing or shortness of breath. , Disp: , Rfl:  .  amLODipine (NORVASC) 10 MG tablet, Take 10 mg by mouth daily., Disp: , Rfl:  .  aspirin 325 MG tablet, Take 1 tablet (325 mg total) by mouth daily., Disp: 30 tablet, Rfl: 0 .  atorvastatin (LIPITOR) 10 MG tablet, Take 10 mg by mouth daily.  , Disp: , Rfl:  .  bismuth subsalicylate (PEPTO BISMOL) 262 MG/15ML  suspension, Take 30 mLs by mouth 2 (two) times daily as needed for indigestion. , Disp: , Rfl:  .  cyanocobalamin (,VITAMIN B-12,) 1000 MCG/ML injection, Inject 1,000 mcg into the muscle every 30 (thirty) days., Disp: , Rfl:  .  divalproex (DEPAKOTE ER) 500 MG 24 hr tablet, Take 1,000 mg by mouth at bedtime. , Disp: , Rfl:  .  Fluticasone-Salmeterol (ADVAIR) 250-50 MCG/DOSE AEPB, Inhale 1 puff into the lungs daily., Disp: , Rfl:  .  Multiple Vitamin (MULTIVITAMIN PO), Take 1 tablet by mouth daily. , Disp: , Rfl:  .  omeprazole (PRILOSEC) 40 MG capsule, Take 40 mg by mouth at bedtime as needed (heartburn). , Disp: , Rfl:  .  oxyCODONE-acetaminophen (PERCOCET/ROXICET) 5-325 MG tablet, Take 1-2 tablets by mouth every 4 (four) hours as needed for moderate pain., Disp: 60 tablet, Rfl: 0 .  sertraline (ZOLOFT) 100 MG tablet, Take 100 mg by mouth at bedtime., Disp: , Rfl:  .  sitaGLIPtin (JANUVIA) 100 MG tablet, Take 100 mg by mouth daily. Takes 1/2 tab, Disp: , Rfl:  .  tamsulosin (FLOMAX) 0.4 MG CAPS capsule, Take 0.4 mg by mouth daily., Disp: , Rfl:  .  traZODone (DESYREL) 50 MG tablet, Take 50 mg by mouth at bedtime. Reported on 10/06/2015, Disp: , Rfl:  .  Calcium Carbonate Antacid (TUMS PO), Take 4 tablets by mouth 4 (four) times daily as needed (heartburn). Reported on 10/06/2015, Disp: , Rfl:  .  memantine (NAMENDA) 10 MG tablet, Take 10 mg  by mouth 2 (two) times daily. Reported on 10/06/2015, Disp: , Rfl:  .  methocarbamol (ROBAXIN) 500 MG tablet, Take 1 tablet (500 mg total) by mouth every 6 (six) hours as needed for muscle spasms. (Patient not taking: Reported on 10/06/2015), Disp: 40 tablet, Rfl: 1 .  QUEtiapine (SEROQUEL) 50 MG tablet, Take 50 mg by mouth at bedtime. Reported on 10/06/2015, Disp: , Rfl:  .  temazepam (RESTORIL) 15 MG capsule, Take 15 mg by mouth at bedtime. Reported on 10/06/2015, Disp: , Rfl:  .  valsartan (DIOVAN) 80 MG tablet, Take 80 mg by mouth daily. Reported on 10/06/2015,  Disp: , Rfl:    Past Medical History  Diagnosis Date  . Microscopic colitis   . Obesity   . HLD (hyperlipidemia)     takes Lipitor daily  . COPD (chronic obstructive pulmonary disease) ([place])   . Asthma   . ED (erectile dysfunction)   . Diverticulosis 01/2007  . Internal hemorrhoids 01/2007  . Cataract   . HTN (hypertension)     takes Altace and Diovan daily  . Pneumonia 3/09  . Joint pain   . Joint swelling   . GERD (gastroesophageal reflux disease)     uses Tums  . History of colon polyps   . Nocturia   . Impaired hearing     "getting this checked out on Monday"  . Anxiety     Xanax as needed  . Depression     takes Cymbalta daily  . Insomnia   . Crohn's colitis ([place])   . Sleep apnea     uses CPAP;has been greater than 29yrs since sleep study- Dr. Elsworth Soho  . DM (diabetes mellitus) ([place])     takes Januvia now, metformin stopped due to diarrhea  . Bipolar disorder ([place])   . Chronic kidney disease     since NSAID's stopped -function has improved  . Arthritis     spinal stenosis.  . Glaucoma     surgery to correct   Allergies  Allergen Reactions  . Shellfish Allergy Anaphylaxis    Patient informed RN no problem with Betadine 08/04/2015   . Nsaids Other (See Comments)    Due to kidney function     Review of Systems Constitutional:   No  weight loss, night sweats,  Fevers, chills, fatigue, or  lassitude.  HEENT:   No headaches,  Difficulty swallowing,  Tooth/dental problems, or  Sore throat,                No sneezing, itching, ear ache, nasal congestion, post nasal drip,   CV:  No chest pain,  Orthopnea, PND, swelling in lower extremities, anasarca, dizziness, palpitations, syncope.   GI  No heartburn, indigestion, abdominal pain, nausea, vomiting, diarrhea, change in bowel habits, loss of appetite, bloody stools.   Resp: No shortness of breath with exertion or at rest.  No excess mucus, no productive cough,  No non-productive cough,  No coughing up of blood.  No  change in color of mucus.  No wheezing.  No chest wall deformity  Skin: no rash or lesions.  GU: no dysuria, change in color of urine, no urgency or frequency.  No flank pain, no hematuria   MS:  No joint pain or swelling.  No decreased range of motion.  No back pain.  Psych:  +change in mood or affect( per wife). + depression or anxiety.  + memory loss.        Objective:   Physical Exam  BP 130/72 mmHg  Pulse 72  Ht 5\' 8"  (1.727 m)  Wt 226 lb 3.2 oz (102.604 kg)  BMI 34.40 kg/m2  SpO2 95%  Physical Exam:  General- No distress,  A&Ox3, lethargic ENT: No sinus tenderness, TM clear, pale nasal mucosa, no oral exudate,no post nasal drip, no LAN Cardiac: S1, S2, regular rate and rhythm, no murmur Chest: No wheeze/ rales/ dullness; no accessory muscle use, no nasal flaring, no sternal retractions Abd.: Soft Non-tender Ext: No clubbing cyanosis, edema Neuro:  normal strength Skin: No rashes, warm and dry Psych: flat affect and behavior        Assessment & Plan:

## 2015-10-07 DIAGNOSIS — I129 Hypertensive chronic kidney disease with stage 1 through stage 4 chronic kidney disease, or unspecified chronic kidney disease: Secondary | ICD-10-CM | POA: Diagnosis not present

## 2015-10-07 DIAGNOSIS — Z4789 Encounter for other orthopedic aftercare: Secondary | ICD-10-CM | POA: Diagnosis not present

## 2015-10-07 DIAGNOSIS — R2689 Other abnormalities of gait and mobility: Secondary | ICD-10-CM | POA: Diagnosis not present

## 2015-10-07 DIAGNOSIS — M1990 Unspecified osteoarthritis, unspecified site: Secondary | ICD-10-CM | POA: Diagnosis not present

## 2015-10-07 DIAGNOSIS — E1122 Type 2 diabetes mellitus with diabetic chronic kidney disease: Secondary | ICD-10-CM | POA: Diagnosis not present

## 2015-10-07 DIAGNOSIS — M519 Unspecified thoracic, thoracolumbar and lumbosacral intervertebral disc disorder: Secondary | ICD-10-CM | POA: Diagnosis not present

## 2015-10-08 DIAGNOSIS — G219 Secondary parkinsonism, unspecified: Secondary | ICD-10-CM | POA: Insufficient documentation

## 2015-10-11 DIAGNOSIS — Z4789 Encounter for other orthopedic aftercare: Secondary | ICD-10-CM | POA: Diagnosis not present

## 2015-10-13 ENCOUNTER — Other Ambulatory Visit: Payer: Self-pay | Admitting: Pulmonary Disease

## 2015-10-13 DIAGNOSIS — G4733 Obstructive sleep apnea (adult) (pediatric): Secondary | ICD-10-CM

## 2015-10-13 NOTE — Assessment & Plan Note (Signed)
Remains sleepy during the day May be 2/2 new medications for newly diagnosed Bipolar Depression Usage of CPAP has been interrupted by hospitalization and rehab for surgery. Worn 42 of 90 days AHI: 43.3 Mask leaks are frequent Plan: We place an order for a new machine. We will order a new mask and supplies until the new machine arrives. We can increase the pressure to 16 cm H2O. Continue on CPAP at bedtime.  Goal is to wear for at least 4-6 hours each night for maximal clinical benefit. Continue to work on weight loss, as the link between excess weight  and sleep apnea is well established.  Do not drive if sleepy. Follow up with Dr. Elsworth Soho In 6 weeks or before as needed.  Please contact office for sooner follow up if symptoms do not improve or worsen or seek emergency care

## 2015-10-13 NOTE — Progress Notes (Addendum)
Place him on auto 10-20 cm, get download in 4 weeks please  This is a late entry on 10/13/2015 as assessment and plan did not attach at time of visit.   Plan for Obstructive Sleep Apnea  as established 10/06/15:  Remains sleepy during the day May be 2/2 new medications for newly diagnosed Bipolar Depression Usage of CPAP has been interrupted by hospitalization and rehab for surgery. Worn 42 of 90 days AHI: 43.3 Mask leaks are frequent Plan: We place an order for a new machine. We will order a new mask and supplies until the new machine arrives. We can increase the pressure to 16 cm H2O. Continue on CPAP at bedtime.  Goal is to wear for at least 4-6 hours each night for maximal clinical benefit. Continue to work on weight loss, as the link between excess weight  and sleep apnea is well established.  Do not drive if sleepy. Follow up with Dr. Elsworth Soho In 6 weeks or before as needed.  Please contact office for sooner follow up if symptoms do not improve or worsen or seek emergency care

## 2015-10-21 DIAGNOSIS — R197 Diarrhea, unspecified: Secondary | ICD-10-CM | POA: Diagnosis not present

## 2015-10-21 DIAGNOSIS — R1084 Generalized abdominal pain: Secondary | ICD-10-CM | POA: Diagnosis not present

## 2015-10-21 DIAGNOSIS — R259 Unspecified abnormal involuntary movements: Secondary | ICD-10-CM | POA: Diagnosis not present

## 2015-10-21 DIAGNOSIS — E860 Dehydration: Secondary | ICD-10-CM | POA: Diagnosis not present

## 2015-10-21 DIAGNOSIS — R109 Unspecified abdominal pain: Secondary | ICD-10-CM | POA: Diagnosis not present

## 2015-10-21 DIAGNOSIS — A419 Sepsis, unspecified organism: Secondary | ICD-10-CM | POA: Diagnosis not present

## 2015-10-22 DIAGNOSIS — J45909 Unspecified asthma, uncomplicated: Secondary | ICD-10-CM | POA: Diagnosis not present

## 2015-10-22 DIAGNOSIS — Z742 Need for assistance at home and no other household member able to render care: Secondary | ICD-10-CM | POA: Diagnosis not present

## 2015-10-22 DIAGNOSIS — E119 Type 2 diabetes mellitus without complications: Secondary | ICD-10-CM | POA: Diagnosis not present

## 2015-10-22 DIAGNOSIS — F0390 Unspecified dementia without behavioral disturbance: Secondary | ICD-10-CM | POA: Diagnosis not present

## 2015-10-22 DIAGNOSIS — E039 Hypothyroidism, unspecified: Secondary | ICD-10-CM | POA: Diagnosis not present

## 2015-10-22 DIAGNOSIS — J449 Chronic obstructive pulmonary disease, unspecified: Secondary | ICD-10-CM | POA: Diagnosis not present

## 2015-10-22 DIAGNOSIS — R197 Diarrhea, unspecified: Secondary | ICD-10-CM | POA: Diagnosis not present

## 2015-10-22 DIAGNOSIS — I10 Essential (primary) hypertension: Secondary | ICD-10-CM | POA: Diagnosis not present

## 2015-10-22 DIAGNOSIS — Z7982 Long term (current) use of aspirin: Secondary | ICD-10-CM | POA: Diagnosis not present

## 2015-10-22 DIAGNOSIS — Z751 Person awaiting admission to adequate facility elsewhere: Secondary | ICD-10-CM | POA: Diagnosis not present

## 2015-11-01 DIAGNOSIS — Z79891 Long term (current) use of opiate analgesic: Secondary | ICD-10-CM | POA: Diagnosis not present

## 2015-11-01 DIAGNOSIS — R030 Elevated blood-pressure reading, without diagnosis of hypertension: Secondary | ICD-10-CM | POA: Diagnosis not present

## 2015-11-01 DIAGNOSIS — S0990XA Unspecified injury of head, initial encounter: Secondary | ICD-10-CM | POA: Diagnosis not present

## 2015-11-01 DIAGNOSIS — M25552 Pain in left hip: Secondary | ICD-10-CM | POA: Diagnosis not present

## 2015-11-01 DIAGNOSIS — R2689 Other abnormalities of gait and mobility: Secondary | ICD-10-CM | POA: Diagnosis not present

## 2015-11-01 DIAGNOSIS — E1122 Type 2 diabetes mellitus with diabetic chronic kidney disease: Secondary | ICD-10-CM | POA: Diagnosis not present

## 2015-11-01 DIAGNOSIS — S7002XA Contusion of left hip, initial encounter: Secondary | ICD-10-CM | POA: Diagnosis not present

## 2015-11-01 DIAGNOSIS — M1990 Unspecified osteoarthritis, unspecified site: Secondary | ICD-10-CM | POA: Diagnosis not present

## 2015-11-01 DIAGNOSIS — F0390 Unspecified dementia without behavioral disturbance: Secondary | ICD-10-CM | POA: Diagnosis not present

## 2015-11-01 DIAGNOSIS — F329 Major depressive disorder, single episode, unspecified: Secondary | ICD-10-CM | POA: Diagnosis not present

## 2015-11-01 DIAGNOSIS — I129 Hypertensive chronic kidney disease with stage 1 through stage 4 chronic kidney disease, or unspecified chronic kidney disease: Secondary | ICD-10-CM | POA: Diagnosis not present

## 2015-11-01 DIAGNOSIS — G319 Degenerative disease of nervous system, unspecified: Secondary | ICD-10-CM | POA: Diagnosis not present

## 2015-11-01 DIAGNOSIS — M519 Unspecified thoracic, thoracolumbar and lumbosacral intervertebral disc disorder: Secondary | ICD-10-CM | POA: Diagnosis not present

## 2015-11-01 DIAGNOSIS — M47812 Spondylosis without myelopathy or radiculopathy, cervical region: Secondary | ICD-10-CM | POA: Diagnosis not present

## 2015-11-01 DIAGNOSIS — S3993XA Unspecified injury of pelvis, initial encounter: Secondary | ICD-10-CM | POA: Diagnosis not present

## 2015-11-01 DIAGNOSIS — M542 Cervicalgia: Secondary | ICD-10-CM | POA: Diagnosis not present

## 2015-11-01 DIAGNOSIS — S161XXA Strain of muscle, fascia and tendon at neck level, initial encounter: Secondary | ICD-10-CM | POA: Diagnosis not present

## 2015-11-01 DIAGNOSIS — Z7984 Long term (current) use of oral hypoglycemic drugs: Secondary | ICD-10-CM | POA: Diagnosis not present

## 2015-11-01 DIAGNOSIS — E669 Obesity, unspecified: Secondary | ICD-10-CM | POA: Diagnosis not present

## 2015-11-01 DIAGNOSIS — M160 Bilateral primary osteoarthritis of hip: Secondary | ICD-10-CM | POA: Diagnosis not present

## 2015-11-01 DIAGNOSIS — Z043 Encounter for examination and observation following other accident: Secondary | ICD-10-CM | POA: Diagnosis not present

## 2015-11-01 DIAGNOSIS — S0093XA Contusion of unspecified part of head, initial encounter: Secondary | ICD-10-CM | POA: Diagnosis not present

## 2015-11-01 DIAGNOSIS — N189 Chronic kidney disease, unspecified: Secondary | ICD-10-CM | POA: Diagnosis not present

## 2015-11-01 DIAGNOSIS — J45909 Unspecified asthma, uncomplicated: Secondary | ICD-10-CM | POA: Diagnosis not present

## 2015-11-01 DIAGNOSIS — R918 Other nonspecific abnormal finding of lung field: Secondary | ICD-10-CM | POA: Diagnosis not present

## 2015-11-01 DIAGNOSIS — K4090 Unilateral inguinal hernia, without obstruction or gangrene, not specified as recurrent: Secondary | ICD-10-CM | POA: Diagnosis not present

## 2015-11-01 DIAGNOSIS — N400 Enlarged prostate without lower urinary tract symptoms: Secondary | ICD-10-CM | POA: Diagnosis not present

## 2015-11-01 DIAGNOSIS — R296 Repeated falls: Secondary | ICD-10-CM | POA: Diagnosis not present

## 2015-11-01 DIAGNOSIS — Z79899 Other long term (current) drug therapy: Secondary | ICD-10-CM | POA: Diagnosis not present

## 2015-11-03 ENCOUNTER — Encounter: Payer: Self-pay | Admitting: Pulmonary Disease

## 2015-11-05 DIAGNOSIS — F0390 Unspecified dementia without behavioral disturbance: Secondary | ICD-10-CM | POA: Diagnosis not present

## 2015-11-05 DIAGNOSIS — R2689 Other abnormalities of gait and mobility: Secondary | ICD-10-CM | POA: Diagnosis not present

## 2015-11-05 DIAGNOSIS — R296 Repeated falls: Secondary | ICD-10-CM | POA: Diagnosis not present

## 2015-11-05 DIAGNOSIS — M1990 Unspecified osteoarthritis, unspecified site: Secondary | ICD-10-CM | POA: Diagnosis not present

## 2015-11-05 DIAGNOSIS — M519 Unspecified thoracic, thoracolumbar and lumbosacral intervertebral disc disorder: Secondary | ICD-10-CM | POA: Diagnosis not present

## 2015-11-05 DIAGNOSIS — I129 Hypertensive chronic kidney disease with stage 1 through stage 4 chronic kidney disease, or unspecified chronic kidney disease: Secondary | ICD-10-CM | POA: Diagnosis not present

## 2015-11-08 DIAGNOSIS — F0390 Unspecified dementia without behavioral disturbance: Secondary | ICD-10-CM | POA: Diagnosis not present

## 2015-11-08 DIAGNOSIS — M519 Unspecified thoracic, thoracolumbar and lumbosacral intervertebral disc disorder: Secondary | ICD-10-CM | POA: Diagnosis not present

## 2015-11-08 DIAGNOSIS — I129 Hypertensive chronic kidney disease with stage 1 through stage 4 chronic kidney disease, or unspecified chronic kidney disease: Secondary | ICD-10-CM | POA: Diagnosis not present

## 2015-11-08 DIAGNOSIS — R296 Repeated falls: Secondary | ICD-10-CM | POA: Diagnosis not present

## 2015-11-08 DIAGNOSIS — R2689 Other abnormalities of gait and mobility: Secondary | ICD-10-CM | POA: Diagnosis not present

## 2015-11-08 DIAGNOSIS — M1990 Unspecified osteoarthritis, unspecified site: Secondary | ICD-10-CM | POA: Diagnosis not present

## 2015-11-09 DIAGNOSIS — M519 Unspecified thoracic, thoracolumbar and lumbosacral intervertebral disc disorder: Secondary | ICD-10-CM | POA: Diagnosis not present

## 2015-11-09 DIAGNOSIS — M1990 Unspecified osteoarthritis, unspecified site: Secondary | ICD-10-CM | POA: Diagnosis not present

## 2015-11-09 DIAGNOSIS — R2689 Other abnormalities of gait and mobility: Secondary | ICD-10-CM | POA: Diagnosis not present

## 2015-11-09 DIAGNOSIS — F0390 Unspecified dementia without behavioral disturbance: Secondary | ICD-10-CM | POA: Diagnosis not present

## 2015-11-09 DIAGNOSIS — R296 Repeated falls: Secondary | ICD-10-CM | POA: Diagnosis not present

## 2015-11-09 DIAGNOSIS — I129 Hypertensive chronic kidney disease with stage 1 through stage 4 chronic kidney disease, or unspecified chronic kidney disease: Secondary | ICD-10-CM | POA: Diagnosis not present

## 2015-11-11 DIAGNOSIS — M1990 Unspecified osteoarthritis, unspecified site: Secondary | ICD-10-CM | POA: Diagnosis not present

## 2015-11-11 DIAGNOSIS — I129 Hypertensive chronic kidney disease with stage 1 through stage 4 chronic kidney disease, or unspecified chronic kidney disease: Secondary | ICD-10-CM | POA: Diagnosis not present

## 2015-11-11 DIAGNOSIS — R2689 Other abnormalities of gait and mobility: Secondary | ICD-10-CM | POA: Diagnosis not present

## 2015-11-11 DIAGNOSIS — R296 Repeated falls: Secondary | ICD-10-CM | POA: Diagnosis not present

## 2015-11-11 DIAGNOSIS — F0390 Unspecified dementia without behavioral disturbance: Secondary | ICD-10-CM | POA: Diagnosis not present

## 2015-11-11 DIAGNOSIS — M519 Unspecified thoracic, thoracolumbar and lumbosacral intervertebral disc disorder: Secondary | ICD-10-CM | POA: Diagnosis not present

## 2015-11-16 DIAGNOSIS — R296 Repeated falls: Secondary | ICD-10-CM | POA: Diagnosis not present

## 2015-11-16 DIAGNOSIS — I129 Hypertensive chronic kidney disease with stage 1 through stage 4 chronic kidney disease, or unspecified chronic kidney disease: Secondary | ICD-10-CM | POA: Diagnosis not present

## 2015-11-16 DIAGNOSIS — F0390 Unspecified dementia without behavioral disturbance: Secondary | ICD-10-CM | POA: Diagnosis not present

## 2015-11-16 DIAGNOSIS — M519 Unspecified thoracic, thoracolumbar and lumbosacral intervertebral disc disorder: Secondary | ICD-10-CM | POA: Diagnosis not present

## 2015-11-16 DIAGNOSIS — M1990 Unspecified osteoarthritis, unspecified site: Secondary | ICD-10-CM | POA: Diagnosis not present

## 2015-11-16 DIAGNOSIS — R2689 Other abnormalities of gait and mobility: Secondary | ICD-10-CM | POA: Diagnosis not present

## 2015-11-17 DIAGNOSIS — R296 Repeated falls: Secondary | ICD-10-CM | POA: Diagnosis not present

## 2015-11-17 DIAGNOSIS — F0390 Unspecified dementia without behavioral disturbance: Secondary | ICD-10-CM | POA: Diagnosis not present

## 2015-11-17 DIAGNOSIS — I129 Hypertensive chronic kidney disease with stage 1 through stage 4 chronic kidney disease, or unspecified chronic kidney disease: Secondary | ICD-10-CM | POA: Diagnosis not present

## 2015-11-19 DIAGNOSIS — M519 Unspecified thoracic, thoracolumbar and lumbosacral intervertebral disc disorder: Secondary | ICD-10-CM | POA: Diagnosis not present

## 2015-11-19 DIAGNOSIS — F0390 Unspecified dementia without behavioral disturbance: Secondary | ICD-10-CM | POA: Diagnosis not present

## 2015-11-19 DIAGNOSIS — I129 Hypertensive chronic kidney disease with stage 1 through stage 4 chronic kidney disease, or unspecified chronic kidney disease: Secondary | ICD-10-CM | POA: Diagnosis not present

## 2015-11-19 DIAGNOSIS — R2689 Other abnormalities of gait and mobility: Secondary | ICD-10-CM | POA: Diagnosis not present

## 2015-11-19 DIAGNOSIS — R296 Repeated falls: Secondary | ICD-10-CM | POA: Diagnosis not present

## 2015-11-19 DIAGNOSIS — M1990 Unspecified osteoarthritis, unspecified site: Secondary | ICD-10-CM | POA: Diagnosis not present

## 2015-11-22 DIAGNOSIS — F0390 Unspecified dementia without behavioral disturbance: Secondary | ICD-10-CM | POA: Diagnosis not present

## 2015-11-22 DIAGNOSIS — R296 Repeated falls: Secondary | ICD-10-CM | POA: Diagnosis not present

## 2015-11-22 DIAGNOSIS — M1990 Unspecified osteoarthritis, unspecified site: Secondary | ICD-10-CM | POA: Diagnosis not present

## 2015-11-22 DIAGNOSIS — M519 Unspecified thoracic, thoracolumbar and lumbosacral intervertebral disc disorder: Secondary | ICD-10-CM | POA: Diagnosis not present

## 2015-11-22 DIAGNOSIS — R2689 Other abnormalities of gait and mobility: Secondary | ICD-10-CM | POA: Diagnosis not present

## 2015-11-22 DIAGNOSIS — I129 Hypertensive chronic kidney disease with stage 1 through stage 4 chronic kidney disease, or unspecified chronic kidney disease: Secondary | ICD-10-CM | POA: Diagnosis not present

## 2015-11-23 DIAGNOSIS — R296 Repeated falls: Secondary | ICD-10-CM | POA: Diagnosis not present

## 2015-11-23 DIAGNOSIS — M519 Unspecified thoracic, thoracolumbar and lumbosacral intervertebral disc disorder: Secondary | ICD-10-CM | POA: Diagnosis not present

## 2015-11-23 DIAGNOSIS — F0390 Unspecified dementia without behavioral disturbance: Secondary | ICD-10-CM | POA: Diagnosis not present

## 2015-11-23 DIAGNOSIS — M1990 Unspecified osteoarthritis, unspecified site: Secondary | ICD-10-CM | POA: Diagnosis not present

## 2015-11-23 DIAGNOSIS — I129 Hypertensive chronic kidney disease with stage 1 through stage 4 chronic kidney disease, or unspecified chronic kidney disease: Secondary | ICD-10-CM | POA: Diagnosis not present

## 2015-11-23 DIAGNOSIS — R2689 Other abnormalities of gait and mobility: Secondary | ICD-10-CM | POA: Diagnosis not present

## 2015-11-24 DIAGNOSIS — R2689 Other abnormalities of gait and mobility: Secondary | ICD-10-CM | POA: Diagnosis not present

## 2015-11-24 DIAGNOSIS — R296 Repeated falls: Secondary | ICD-10-CM | POA: Diagnosis not present

## 2015-11-24 DIAGNOSIS — M519 Unspecified thoracic, thoracolumbar and lumbosacral intervertebral disc disorder: Secondary | ICD-10-CM | POA: Diagnosis not present

## 2015-11-24 DIAGNOSIS — I129 Hypertensive chronic kidney disease with stage 1 through stage 4 chronic kidney disease, or unspecified chronic kidney disease: Secondary | ICD-10-CM | POA: Diagnosis not present

## 2015-11-24 DIAGNOSIS — M1990 Unspecified osteoarthritis, unspecified site: Secondary | ICD-10-CM | POA: Diagnosis not present

## 2015-11-24 DIAGNOSIS — F0390 Unspecified dementia without behavioral disturbance: Secondary | ICD-10-CM | POA: Diagnosis not present

## 2015-11-30 DIAGNOSIS — R296 Repeated falls: Secondary | ICD-10-CM | POA: Diagnosis not present

## 2015-11-30 DIAGNOSIS — I129 Hypertensive chronic kidney disease with stage 1 through stage 4 chronic kidney disease, or unspecified chronic kidney disease: Secondary | ICD-10-CM | POA: Diagnosis not present

## 2015-11-30 DIAGNOSIS — F0390 Unspecified dementia without behavioral disturbance: Secondary | ICD-10-CM | POA: Diagnosis not present

## 2015-11-30 DIAGNOSIS — R2689 Other abnormalities of gait and mobility: Secondary | ICD-10-CM | POA: Diagnosis not present

## 2015-11-30 DIAGNOSIS — M519 Unspecified thoracic, thoracolumbar and lumbosacral intervertebral disc disorder: Secondary | ICD-10-CM | POA: Diagnosis not present

## 2015-11-30 DIAGNOSIS — M1990 Unspecified osteoarthritis, unspecified site: Secondary | ICD-10-CM | POA: Diagnosis not present

## 2015-12-07 DIAGNOSIS — M1990 Unspecified osteoarthritis, unspecified site: Secondary | ICD-10-CM | POA: Diagnosis not present

## 2015-12-07 DIAGNOSIS — R296 Repeated falls: Secondary | ICD-10-CM | POA: Diagnosis not present

## 2015-12-07 DIAGNOSIS — I129 Hypertensive chronic kidney disease with stage 1 through stage 4 chronic kidney disease, or unspecified chronic kidney disease: Secondary | ICD-10-CM | POA: Diagnosis not present

## 2015-12-07 DIAGNOSIS — R2689 Other abnormalities of gait and mobility: Secondary | ICD-10-CM | POA: Diagnosis not present

## 2015-12-07 DIAGNOSIS — M519 Unspecified thoracic, thoracolumbar and lumbosacral intervertebral disc disorder: Secondary | ICD-10-CM | POA: Diagnosis not present

## 2015-12-07 DIAGNOSIS — F0390 Unspecified dementia without behavioral disturbance: Secondary | ICD-10-CM | POA: Diagnosis not present

## 2015-12-10 DIAGNOSIS — I129 Hypertensive chronic kidney disease with stage 1 through stage 4 chronic kidney disease, or unspecified chronic kidney disease: Secondary | ICD-10-CM | POA: Diagnosis not present

## 2015-12-10 DIAGNOSIS — F0390 Unspecified dementia without behavioral disturbance: Secondary | ICD-10-CM | POA: Diagnosis not present

## 2015-12-10 DIAGNOSIS — R296 Repeated falls: Secondary | ICD-10-CM | POA: Diagnosis not present

## 2015-12-10 DIAGNOSIS — M519 Unspecified thoracic, thoracolumbar and lumbosacral intervertebral disc disorder: Secondary | ICD-10-CM | POA: Diagnosis not present

## 2015-12-10 DIAGNOSIS — M1990 Unspecified osteoarthritis, unspecified site: Secondary | ICD-10-CM | POA: Diagnosis not present

## 2015-12-10 DIAGNOSIS — R2689 Other abnormalities of gait and mobility: Secondary | ICD-10-CM | POA: Diagnosis not present

## 2015-12-14 DIAGNOSIS — R2689 Other abnormalities of gait and mobility: Secondary | ICD-10-CM | POA: Diagnosis not present

## 2015-12-14 DIAGNOSIS — F0390 Unspecified dementia without behavioral disturbance: Secondary | ICD-10-CM | POA: Diagnosis not present

## 2015-12-14 DIAGNOSIS — R296 Repeated falls: Secondary | ICD-10-CM | POA: Diagnosis not present

## 2015-12-14 DIAGNOSIS — I129 Hypertensive chronic kidney disease with stage 1 through stage 4 chronic kidney disease, or unspecified chronic kidney disease: Secondary | ICD-10-CM | POA: Diagnosis not present

## 2015-12-14 DIAGNOSIS — K219 Gastro-esophageal reflux disease without esophagitis: Secondary | ICD-10-CM | POA: Diagnosis not present

## 2015-12-14 DIAGNOSIS — M1990 Unspecified osteoarthritis, unspecified site: Secondary | ICD-10-CM | POA: Diagnosis not present

## 2015-12-14 DIAGNOSIS — M519 Unspecified thoracic, thoracolumbar and lumbosacral intervertebral disc disorder: Secondary | ICD-10-CM | POA: Diagnosis not present

## 2015-12-16 DIAGNOSIS — I129 Hypertensive chronic kidney disease with stage 1 through stage 4 chronic kidney disease, or unspecified chronic kidney disease: Secondary | ICD-10-CM | POA: Diagnosis not present

## 2015-12-16 DIAGNOSIS — R296 Repeated falls: Secondary | ICD-10-CM | POA: Diagnosis not present

## 2015-12-16 DIAGNOSIS — R2689 Other abnormalities of gait and mobility: Secondary | ICD-10-CM | POA: Diagnosis not present

## 2015-12-16 DIAGNOSIS — M519 Unspecified thoracic, thoracolumbar and lumbosacral intervertebral disc disorder: Secondary | ICD-10-CM | POA: Diagnosis not present

## 2015-12-16 DIAGNOSIS — M1990 Unspecified osteoarthritis, unspecified site: Secondary | ICD-10-CM | POA: Diagnosis not present

## 2015-12-16 DIAGNOSIS — F0390 Unspecified dementia without behavioral disturbance: Secondary | ICD-10-CM | POA: Diagnosis not present

## 2015-12-20 DIAGNOSIS — R2689 Other abnormalities of gait and mobility: Secondary | ICD-10-CM | POA: Diagnosis not present

## 2015-12-20 DIAGNOSIS — E119 Type 2 diabetes mellitus without complications: Secondary | ICD-10-CM | POA: Diagnosis not present

## 2015-12-20 DIAGNOSIS — I10 Essential (primary) hypertension: Secondary | ICD-10-CM | POA: Diagnosis not present

## 2015-12-20 DIAGNOSIS — M4800 Spinal stenosis, site unspecified: Secondary | ICD-10-CM | POA: Diagnosis not present

## 2015-12-21 DIAGNOSIS — R2689 Other abnormalities of gait and mobility: Secondary | ICD-10-CM | POA: Diagnosis not present

## 2015-12-21 DIAGNOSIS — F0390 Unspecified dementia without behavioral disturbance: Secondary | ICD-10-CM | POA: Diagnosis not present

## 2015-12-21 DIAGNOSIS — R296 Repeated falls: Secondary | ICD-10-CM | POA: Diagnosis not present

## 2015-12-21 DIAGNOSIS — M519 Unspecified thoracic, thoracolumbar and lumbosacral intervertebral disc disorder: Secondary | ICD-10-CM | POA: Diagnosis not present

## 2015-12-21 DIAGNOSIS — I129 Hypertensive chronic kidney disease with stage 1 through stage 4 chronic kidney disease, or unspecified chronic kidney disease: Secondary | ICD-10-CM | POA: Diagnosis not present

## 2015-12-21 DIAGNOSIS — M1990 Unspecified osteoarthritis, unspecified site: Secondary | ICD-10-CM | POA: Diagnosis not present

## 2015-12-27 ENCOUNTER — Ambulatory Visit: Payer: Medicare Other | Admitting: Pulmonary Disease

## 2015-12-27 DIAGNOSIS — K219 Gastro-esophageal reflux disease without esophagitis: Secondary | ICD-10-CM | POA: Diagnosis not present

## 2015-12-28 DIAGNOSIS — M519 Unspecified thoracic, thoracolumbar and lumbosacral intervertebral disc disorder: Secondary | ICD-10-CM | POA: Diagnosis not present

## 2015-12-28 DIAGNOSIS — I129 Hypertensive chronic kidney disease with stage 1 through stage 4 chronic kidney disease, or unspecified chronic kidney disease: Secondary | ICD-10-CM | POA: Diagnosis not present

## 2015-12-28 DIAGNOSIS — F0390 Unspecified dementia without behavioral disturbance: Secondary | ICD-10-CM | POA: Diagnosis not present

## 2015-12-28 DIAGNOSIS — R2689 Other abnormalities of gait and mobility: Secondary | ICD-10-CM | POA: Diagnosis not present

## 2015-12-28 DIAGNOSIS — M1990 Unspecified osteoarthritis, unspecified site: Secondary | ICD-10-CM | POA: Diagnosis not present

## 2015-12-28 DIAGNOSIS — R296 Repeated falls: Secondary | ICD-10-CM | POA: Diagnosis not present

## 2015-12-31 DIAGNOSIS — Z79891 Long term (current) use of opiate analgesic: Secondary | ICD-10-CM | POA: Diagnosis not present

## 2015-12-31 DIAGNOSIS — R2689 Other abnormalities of gait and mobility: Secondary | ICD-10-CM | POA: Diagnosis not present

## 2015-12-31 DIAGNOSIS — E1122 Type 2 diabetes mellitus with diabetic chronic kidney disease: Secondary | ICD-10-CM | POA: Diagnosis not present

## 2015-12-31 DIAGNOSIS — Z7984 Long term (current) use of oral hypoglycemic drugs: Secondary | ICD-10-CM | POA: Diagnosis not present

## 2015-12-31 DIAGNOSIS — I129 Hypertensive chronic kidney disease with stage 1 through stage 4 chronic kidney disease, or unspecified chronic kidney disease: Secondary | ICD-10-CM | POA: Diagnosis not present

## 2015-12-31 DIAGNOSIS — N189 Chronic kidney disease, unspecified: Secondary | ICD-10-CM | POA: Diagnosis not present

## 2015-12-31 DIAGNOSIS — J45909 Unspecified asthma, uncomplicated: Secondary | ICD-10-CM | POA: Diagnosis not present

## 2015-12-31 DIAGNOSIS — M1990 Unspecified osteoarthritis, unspecified site: Secondary | ICD-10-CM | POA: Diagnosis not present

## 2015-12-31 DIAGNOSIS — F329 Major depressive disorder, single episode, unspecified: Secondary | ICD-10-CM | POA: Diagnosis not present

## 2015-12-31 DIAGNOSIS — E669 Obesity, unspecified: Secondary | ICD-10-CM | POA: Diagnosis not present

## 2015-12-31 DIAGNOSIS — D519 Vitamin B12 deficiency anemia, unspecified: Secondary | ICD-10-CM | POA: Diagnosis not present

## 2015-12-31 DIAGNOSIS — Z79899 Other long term (current) drug therapy: Secondary | ICD-10-CM | POA: Diagnosis not present

## 2015-12-31 DIAGNOSIS — M519 Unspecified thoracic, thoracolumbar and lumbosacral intervertebral disc disorder: Secondary | ICD-10-CM | POA: Diagnosis not present

## 2015-12-31 DIAGNOSIS — F0390 Unspecified dementia without behavioral disturbance: Secondary | ICD-10-CM | POA: Diagnosis not present

## 2015-12-31 DIAGNOSIS — R296 Repeated falls: Secondary | ICD-10-CM | POA: Diagnosis not present

## 2016-01-03 DIAGNOSIS — M79671 Pain in right foot: Secondary | ICD-10-CM | POA: Diagnosis not present

## 2016-01-03 DIAGNOSIS — B351 Tinea unguium: Secondary | ICD-10-CM | POA: Diagnosis not present

## 2016-01-03 DIAGNOSIS — M79672 Pain in left foot: Secondary | ICD-10-CM | POA: Diagnosis not present

## 2016-01-03 DIAGNOSIS — E119 Type 2 diabetes mellitus without complications: Secondary | ICD-10-CM | POA: Diagnosis not present

## 2016-01-04 DIAGNOSIS — R296 Repeated falls: Secondary | ICD-10-CM | POA: Diagnosis not present

## 2016-01-04 DIAGNOSIS — D519 Vitamin B12 deficiency anemia, unspecified: Secondary | ICD-10-CM | POA: Diagnosis not present

## 2016-01-04 DIAGNOSIS — M1990 Unspecified osteoarthritis, unspecified site: Secondary | ICD-10-CM | POA: Diagnosis not present

## 2016-01-04 DIAGNOSIS — I129 Hypertensive chronic kidney disease with stage 1 through stage 4 chronic kidney disease, or unspecified chronic kidney disease: Secondary | ICD-10-CM | POA: Diagnosis not present

## 2016-01-04 DIAGNOSIS — R2689 Other abnormalities of gait and mobility: Secondary | ICD-10-CM | POA: Diagnosis not present

## 2016-01-04 DIAGNOSIS — I10 Essential (primary) hypertension: Secondary | ICD-10-CM | POA: Diagnosis not present

## 2016-01-04 DIAGNOSIS — M519 Unspecified thoracic, thoracolumbar and lumbosacral intervertebral disc disorder: Secondary | ICD-10-CM | POA: Diagnosis not present

## 2016-01-04 DIAGNOSIS — F0390 Unspecified dementia without behavioral disturbance: Secondary | ICD-10-CM | POA: Diagnosis not present

## 2016-01-05 ENCOUNTER — Encounter: Payer: Self-pay | Admitting: Pulmonary Disease

## 2016-01-11 DIAGNOSIS — F0390 Unspecified dementia without behavioral disturbance: Secondary | ICD-10-CM | POA: Diagnosis not present

## 2016-01-11 DIAGNOSIS — D519 Vitamin B12 deficiency anemia, unspecified: Secondary | ICD-10-CM | POA: Diagnosis not present

## 2016-01-11 DIAGNOSIS — M519 Unspecified thoracic, thoracolumbar and lumbosacral intervertebral disc disorder: Secondary | ICD-10-CM | POA: Diagnosis not present

## 2016-01-11 DIAGNOSIS — M1990 Unspecified osteoarthritis, unspecified site: Secondary | ICD-10-CM | POA: Diagnosis not present

## 2016-01-11 DIAGNOSIS — I129 Hypertensive chronic kidney disease with stage 1 through stage 4 chronic kidney disease, or unspecified chronic kidney disease: Secondary | ICD-10-CM | POA: Diagnosis not present

## 2016-01-11 DIAGNOSIS — R296 Repeated falls: Secondary | ICD-10-CM | POA: Diagnosis not present

## 2016-01-13 DIAGNOSIS — M5442 Lumbago with sciatica, left side: Secondary | ICD-10-CM | POA: Diagnosis not present

## 2016-01-18 DIAGNOSIS — D519 Vitamin B12 deficiency anemia, unspecified: Secondary | ICD-10-CM | POA: Diagnosis not present

## 2016-01-18 DIAGNOSIS — F0390 Unspecified dementia without behavioral disturbance: Secondary | ICD-10-CM | POA: Diagnosis not present

## 2016-01-18 DIAGNOSIS — I129 Hypertensive chronic kidney disease with stage 1 through stage 4 chronic kidney disease, or unspecified chronic kidney disease: Secondary | ICD-10-CM | POA: Diagnosis not present

## 2016-01-18 DIAGNOSIS — M519 Unspecified thoracic, thoracolumbar and lumbosacral intervertebral disc disorder: Secondary | ICD-10-CM | POA: Diagnosis not present

## 2016-01-18 DIAGNOSIS — R296 Repeated falls: Secondary | ICD-10-CM | POA: Diagnosis not present

## 2016-01-18 DIAGNOSIS — M1990 Unspecified osteoarthritis, unspecified site: Secondary | ICD-10-CM | POA: Diagnosis not present

## 2016-01-20 DIAGNOSIS — F0390 Unspecified dementia without behavioral disturbance: Secondary | ICD-10-CM | POA: Diagnosis not present

## 2016-01-20 DIAGNOSIS — M1990 Unspecified osteoarthritis, unspecified site: Secondary | ICD-10-CM | POA: Diagnosis not present

## 2016-01-20 DIAGNOSIS — M519 Unspecified thoracic, thoracolumbar and lumbosacral intervertebral disc disorder: Secondary | ICD-10-CM | POA: Diagnosis not present

## 2016-01-20 DIAGNOSIS — R296 Repeated falls: Secondary | ICD-10-CM | POA: Diagnosis not present

## 2016-01-20 DIAGNOSIS — D519 Vitamin B12 deficiency anemia, unspecified: Secondary | ICD-10-CM | POA: Diagnosis not present

## 2016-01-20 DIAGNOSIS — I129 Hypertensive chronic kidney disease with stage 1 through stage 4 chronic kidney disease, or unspecified chronic kidney disease: Secondary | ICD-10-CM | POA: Diagnosis not present

## 2016-01-24 DIAGNOSIS — M1990 Unspecified osteoarthritis, unspecified site: Secondary | ICD-10-CM | POA: Diagnosis not present

## 2016-01-24 DIAGNOSIS — I129 Hypertensive chronic kidney disease with stage 1 through stage 4 chronic kidney disease, or unspecified chronic kidney disease: Secondary | ICD-10-CM | POA: Diagnosis not present

## 2016-01-24 DIAGNOSIS — D519 Vitamin B12 deficiency anemia, unspecified: Secondary | ICD-10-CM | POA: Diagnosis not present

## 2016-01-24 DIAGNOSIS — R296 Repeated falls: Secondary | ICD-10-CM | POA: Diagnosis not present

## 2016-01-24 DIAGNOSIS — F0390 Unspecified dementia without behavioral disturbance: Secondary | ICD-10-CM | POA: Diagnosis not present

## 2016-01-24 DIAGNOSIS — M519 Unspecified thoracic, thoracolumbar and lumbosacral intervertebral disc disorder: Secondary | ICD-10-CM | POA: Diagnosis not present

## 2016-01-25 DIAGNOSIS — D519 Vitamin B12 deficiency anemia, unspecified: Secondary | ICD-10-CM | POA: Diagnosis not present

## 2016-01-25 DIAGNOSIS — R296 Repeated falls: Secondary | ICD-10-CM | POA: Diagnosis not present

## 2016-01-25 DIAGNOSIS — M519 Unspecified thoracic, thoracolumbar and lumbosacral intervertebral disc disorder: Secondary | ICD-10-CM | POA: Diagnosis not present

## 2016-01-25 DIAGNOSIS — F0390 Unspecified dementia without behavioral disturbance: Secondary | ICD-10-CM | POA: Diagnosis not present

## 2016-01-25 DIAGNOSIS — I129 Hypertensive chronic kidney disease with stage 1 through stage 4 chronic kidney disease, or unspecified chronic kidney disease: Secondary | ICD-10-CM | POA: Diagnosis not present

## 2016-01-25 DIAGNOSIS — M1990 Unspecified osteoarthritis, unspecified site: Secondary | ICD-10-CM | POA: Diagnosis not present

## 2016-01-26 DIAGNOSIS — M519 Unspecified thoracic, thoracolumbar and lumbosacral intervertebral disc disorder: Secondary | ICD-10-CM | POA: Diagnosis not present

## 2016-01-26 DIAGNOSIS — R296 Repeated falls: Secondary | ICD-10-CM | POA: Diagnosis not present

## 2016-01-26 DIAGNOSIS — F0390 Unspecified dementia without behavioral disturbance: Secondary | ICD-10-CM | POA: Diagnosis not present

## 2016-01-26 DIAGNOSIS — I129 Hypertensive chronic kidney disease with stage 1 through stage 4 chronic kidney disease, or unspecified chronic kidney disease: Secondary | ICD-10-CM | POA: Diagnosis not present

## 2016-01-26 DIAGNOSIS — D519 Vitamin B12 deficiency anemia, unspecified: Secondary | ICD-10-CM | POA: Diagnosis not present

## 2016-01-26 DIAGNOSIS — M1990 Unspecified osteoarthritis, unspecified site: Secondary | ICD-10-CM | POA: Diagnosis not present

## 2016-01-28 DIAGNOSIS — M519 Unspecified thoracic, thoracolumbar and lumbosacral intervertebral disc disorder: Secondary | ICD-10-CM | POA: Diagnosis not present

## 2016-01-28 DIAGNOSIS — M1990 Unspecified osteoarthritis, unspecified site: Secondary | ICD-10-CM | POA: Diagnosis not present

## 2016-01-28 DIAGNOSIS — F0390 Unspecified dementia without behavioral disturbance: Secondary | ICD-10-CM | POA: Diagnosis not present

## 2016-01-28 DIAGNOSIS — D519 Vitamin B12 deficiency anemia, unspecified: Secondary | ICD-10-CM | POA: Diagnosis not present

## 2016-01-28 DIAGNOSIS — R296 Repeated falls: Secondary | ICD-10-CM | POA: Diagnosis not present

## 2016-01-28 DIAGNOSIS — I129 Hypertensive chronic kidney disease with stage 1 through stage 4 chronic kidney disease, or unspecified chronic kidney disease: Secondary | ICD-10-CM | POA: Diagnosis not present

## 2016-02-01 DIAGNOSIS — M519 Unspecified thoracic, thoracolumbar and lumbosacral intervertebral disc disorder: Secondary | ICD-10-CM | POA: Diagnosis not present

## 2016-02-01 DIAGNOSIS — R296 Repeated falls: Secondary | ICD-10-CM | POA: Diagnosis not present

## 2016-02-01 DIAGNOSIS — M1990 Unspecified osteoarthritis, unspecified site: Secondary | ICD-10-CM | POA: Diagnosis not present

## 2016-02-01 DIAGNOSIS — F0390 Unspecified dementia without behavioral disturbance: Secondary | ICD-10-CM | POA: Diagnosis not present

## 2016-02-01 DIAGNOSIS — I129 Hypertensive chronic kidney disease with stage 1 through stage 4 chronic kidney disease, or unspecified chronic kidney disease: Secondary | ICD-10-CM | POA: Diagnosis not present

## 2016-02-01 DIAGNOSIS — D519 Vitamin B12 deficiency anemia, unspecified: Secondary | ICD-10-CM | POA: Diagnosis not present

## 2016-02-02 DIAGNOSIS — Z0289 Encounter for other administrative examinations: Secondary | ICD-10-CM | POA: Diagnosis not present

## 2016-02-02 DIAGNOSIS — Z6832 Body mass index (BMI) 32.0-32.9, adult: Secondary | ICD-10-CM | POA: Diagnosis not present

## 2016-02-02 DIAGNOSIS — F3110 Bipolar disorder, current episode manic without psychotic features, unspecified: Secondary | ICD-10-CM | POA: Diagnosis not present

## 2016-02-02 DIAGNOSIS — E1129 Type 2 diabetes mellitus with other diabetic kidney complication: Secondary | ICD-10-CM | POA: Diagnosis not present

## 2016-02-08 DIAGNOSIS — I129 Hypertensive chronic kidney disease with stage 1 through stage 4 chronic kidney disease, or unspecified chronic kidney disease: Secondary | ICD-10-CM | POA: Diagnosis not present

## 2016-02-08 DIAGNOSIS — F0390 Unspecified dementia without behavioral disturbance: Secondary | ICD-10-CM | POA: Diagnosis not present

## 2016-02-08 DIAGNOSIS — D519 Vitamin B12 deficiency anemia, unspecified: Secondary | ICD-10-CM | POA: Diagnosis not present

## 2016-02-08 DIAGNOSIS — M1990 Unspecified osteoarthritis, unspecified site: Secondary | ICD-10-CM | POA: Diagnosis not present

## 2016-02-08 DIAGNOSIS — M519 Unspecified thoracic, thoracolumbar and lumbosacral intervertebral disc disorder: Secondary | ICD-10-CM | POA: Diagnosis not present

## 2016-02-08 DIAGNOSIS — R296 Repeated falls: Secondary | ICD-10-CM | POA: Diagnosis not present

## 2016-02-11 DIAGNOSIS — F329 Major depressive disorder, single episode, unspecified: Secondary | ICD-10-CM | POA: Diagnosis not present

## 2016-02-11 DIAGNOSIS — I129 Hypertensive chronic kidney disease with stage 1 through stage 4 chronic kidney disease, or unspecified chronic kidney disease: Secondary | ICD-10-CM | POA: Diagnosis not present

## 2016-02-11 DIAGNOSIS — K5901 Slow transit constipation: Secondary | ICD-10-CM | POA: Diagnosis not present

## 2016-02-11 DIAGNOSIS — F319 Bipolar disorder, unspecified: Secondary | ICD-10-CM | POA: Diagnosis not present

## 2016-02-11 DIAGNOSIS — N183 Chronic kidney disease, stage 3 (moderate): Secondary | ICD-10-CM | POA: Diagnosis not present

## 2016-02-11 DIAGNOSIS — E1122 Type 2 diabetes mellitus with diabetic chronic kidney disease: Secondary | ICD-10-CM | POA: Diagnosis not present

## 2016-02-11 DIAGNOSIS — F0390 Unspecified dementia without behavioral disturbance: Secondary | ICD-10-CM | POA: Diagnosis not present

## 2016-02-11 DIAGNOSIS — R45851 Suicidal ideations: Secondary | ICD-10-CM | POA: Diagnosis not present

## 2016-02-12 DIAGNOSIS — N400 Enlarged prostate without lower urinary tract symptoms: Secondary | ICD-10-CM | POA: Diagnosis present

## 2016-02-12 DIAGNOSIS — F0390 Unspecified dementia without behavioral disturbance: Secondary | ICD-10-CM | POA: Diagnosis present

## 2016-02-12 DIAGNOSIS — Z881 Allergy status to other antibiotic agents status: Secondary | ICD-10-CM | POA: Diagnosis not present

## 2016-02-12 DIAGNOSIS — Z7984 Long term (current) use of oral hypoglycemic drugs: Secondary | ICD-10-CM | POA: Diagnosis not present

## 2016-02-12 DIAGNOSIS — F319 Bipolar disorder, unspecified: Secondary | ICD-10-CM | POA: Diagnosis not present

## 2016-02-12 DIAGNOSIS — E785 Hyperlipidemia, unspecified: Secondary | ICD-10-CM | POA: Diagnosis present

## 2016-02-12 DIAGNOSIS — Z9119 Patient's noncompliance with other medical treatment and regimen: Secondary | ICD-10-CM | POA: Diagnosis not present

## 2016-02-12 DIAGNOSIS — Z91013 Allergy to seafood: Secondary | ICD-10-CM | POA: Diagnosis not present

## 2016-02-12 DIAGNOSIS — E119 Type 2 diabetes mellitus without complications: Secondary | ICD-10-CM | POA: Diagnosis not present

## 2016-02-12 DIAGNOSIS — Z9689 Presence of other specified functional implants: Secondary | ICD-10-CM | POA: Diagnosis present

## 2016-02-12 DIAGNOSIS — Z9049 Acquired absence of other specified parts of digestive tract: Secondary | ICD-10-CM | POA: Diagnosis not present

## 2016-02-12 DIAGNOSIS — I10 Essential (primary) hypertension: Secondary | ICD-10-CM | POA: Diagnosis not present

## 2016-02-12 DIAGNOSIS — J4520 Mild intermittent asthma, uncomplicated: Secondary | ICD-10-CM | POA: Diagnosis not present

## 2016-02-12 DIAGNOSIS — N183 Chronic kidney disease, stage 3 (moderate): Secondary | ICD-10-CM | POA: Diagnosis not present

## 2016-02-12 DIAGNOSIS — K5901 Slow transit constipation: Secondary | ICD-10-CM | POA: Diagnosis not present

## 2016-02-12 DIAGNOSIS — Z79899 Other long term (current) drug therapy: Secondary | ICD-10-CM | POA: Diagnosis not present

## 2016-02-12 DIAGNOSIS — Z7951 Long term (current) use of inhaled steroids: Secondary | ICD-10-CM | POA: Diagnosis not present

## 2016-02-12 DIAGNOSIS — E1122 Type 2 diabetes mellitus with diabetic chronic kidney disease: Secondary | ICD-10-CM | POA: Diagnosis present

## 2016-02-12 DIAGNOSIS — Z7982 Long term (current) use of aspirin: Secondary | ICD-10-CM | POA: Diagnosis not present

## 2016-02-12 DIAGNOSIS — I129 Hypertensive chronic kidney disease with stage 1 through stage 4 chronic kidney disease, or unspecified chronic kidney disease: Secondary | ICD-10-CM | POA: Diagnosis present

## 2016-02-12 DIAGNOSIS — G4733 Obstructive sleep apnea (adult) (pediatric): Secondary | ICD-10-CM | POA: Diagnosis present

## 2016-03-01 DIAGNOSIS — I10 Essential (primary) hypertension: Secondary | ICD-10-CM | POA: Diagnosis not present

## 2016-03-01 DIAGNOSIS — Z6832 Body mass index (BMI) 32.0-32.9, adult: Secondary | ICD-10-CM | POA: Diagnosis not present

## 2016-03-01 DIAGNOSIS — G3184 Mild cognitive impairment, so stated: Secondary | ICD-10-CM | POA: Diagnosis not present

## 2016-03-01 DIAGNOSIS — N183 Chronic kidney disease, stage 3 (moderate): Secondary | ICD-10-CM | POA: Diagnosis not present

## 2016-03-01 DIAGNOSIS — E1129 Type 2 diabetes mellitus with other diabetic kidney complication: Secondary | ICD-10-CM | POA: Diagnosis not present

## 2016-03-01 DIAGNOSIS — E784 Other hyperlipidemia: Secondary | ICD-10-CM | POA: Diagnosis not present

## 2016-03-01 DIAGNOSIS — F3110 Bipolar disorder, current episode manic without psychotic features, unspecified: Secondary | ICD-10-CM | POA: Diagnosis not present

## 2016-03-01 DIAGNOSIS — E538 Deficiency of other specified B group vitamins: Secondary | ICD-10-CM | POA: Diagnosis not present

## 2016-03-05 DIAGNOSIS — J209 Acute bronchitis, unspecified: Secondary | ICD-10-CM | POA: Diagnosis not present

## 2016-03-05 DIAGNOSIS — J0100 Acute maxillary sinusitis, unspecified: Secondary | ICD-10-CM | POA: Diagnosis not present

## 2016-03-06 DIAGNOSIS — E789 Disorder of lipoprotein metabolism, unspecified: Secondary | ICD-10-CM | POA: Diagnosis not present

## 2016-03-06 DIAGNOSIS — E118 Type 2 diabetes mellitus with unspecified complications: Secondary | ICD-10-CM | POA: Diagnosis not present

## 2016-03-06 DIAGNOSIS — I10 Essential (primary) hypertension: Secondary | ICD-10-CM | POA: Diagnosis not present

## 2016-03-09 DIAGNOSIS — L570 Actinic keratosis: Secondary | ICD-10-CM | POA: Diagnosis not present

## 2016-03-14 ENCOUNTER — Encounter: Payer: Self-pay | Admitting: Internal Medicine

## 2016-03-15 DIAGNOSIS — J209 Acute bronchitis, unspecified: Secondary | ICD-10-CM | POA: Diagnosis not present

## 2016-03-15 DIAGNOSIS — J45909 Unspecified asthma, uncomplicated: Secondary | ICD-10-CM | POA: Diagnosis not present

## 2016-03-22 DIAGNOSIS — J45909 Unspecified asthma, uncomplicated: Secondary | ICD-10-CM | POA: Diagnosis not present

## 2016-03-22 DIAGNOSIS — F0390 Unspecified dementia without behavioral disturbance: Secondary | ICD-10-CM | POA: Diagnosis not present

## 2016-03-22 DIAGNOSIS — J9811 Atelectasis: Secondary | ICD-10-CM | POA: Diagnosis not present

## 2016-03-22 DIAGNOSIS — F319 Bipolar disorder, unspecified: Secondary | ICD-10-CM | POA: Diagnosis not present

## 2016-03-22 DIAGNOSIS — E039 Hypothyroidism, unspecified: Secondary | ICD-10-CM | POA: Diagnosis not present

## 2016-03-22 DIAGNOSIS — G4733 Obstructive sleep apnea (adult) (pediatric): Secondary | ICD-10-CM | POA: Diagnosis not present

## 2016-03-22 DIAGNOSIS — E7800 Pure hypercholesterolemia, unspecified: Secondary | ICD-10-CM | POA: Diagnosis not present

## 2016-03-22 DIAGNOSIS — R29818 Other symptoms and signs involving the nervous system: Secondary | ICD-10-CM | POA: Diagnosis not present

## 2016-03-22 DIAGNOSIS — J449 Chronic obstructive pulmonary disease, unspecified: Secondary | ICD-10-CM | POA: Diagnosis not present

## 2016-03-22 DIAGNOSIS — R569 Unspecified convulsions: Secondary | ICD-10-CM | POA: Diagnosis not present

## 2016-03-22 DIAGNOSIS — I129 Hypertensive chronic kidney disease with stage 1 through stage 4 chronic kidney disease, or unspecified chronic kidney disease: Secondary | ICD-10-CM | POA: Diagnosis not present

## 2016-03-22 DIAGNOSIS — Z7982 Long term (current) use of aspirin: Secondary | ICD-10-CM | POA: Diagnosis not present

## 2016-03-22 DIAGNOSIS — I639 Cerebral infarction, unspecified: Secondary | ICD-10-CM | POA: Diagnosis not present

## 2016-03-22 DIAGNOSIS — Z79899 Other long term (current) drug therapy: Secondary | ICD-10-CM | POA: Diagnosis not present

## 2016-03-22 DIAGNOSIS — K219 Gastro-esophageal reflux disease without esophagitis: Secondary | ICD-10-CM | POA: Diagnosis not present

## 2016-03-22 DIAGNOSIS — N189 Chronic kidney disease, unspecified: Secondary | ICD-10-CM | POA: Diagnosis not present

## 2016-03-22 DIAGNOSIS — E785 Hyperlipidemia, unspecified: Secondary | ICD-10-CM | POA: Diagnosis not present

## 2016-03-22 DIAGNOSIS — I6789 Other cerebrovascular disease: Secondary | ICD-10-CM | POA: Diagnosis not present

## 2016-03-22 DIAGNOSIS — F419 Anxiety disorder, unspecified: Secondary | ICD-10-CM | POA: Diagnosis not present

## 2016-03-22 DIAGNOSIS — E1122 Type 2 diabetes mellitus with diabetic chronic kidney disease: Secondary | ICD-10-CM | POA: Diagnosis not present

## 2016-03-22 DIAGNOSIS — G459 Transient cerebral ischemic attack, unspecified: Secondary | ICD-10-CM | POA: Diagnosis not present

## 2016-03-22 DIAGNOSIS — N400 Enlarged prostate without lower urinary tract symptoms: Secondary | ICD-10-CM | POA: Diagnosis not present

## 2016-03-22 DIAGNOSIS — R29810 Facial weakness: Secondary | ICD-10-CM | POA: Diagnosis not present

## 2016-03-23 DIAGNOSIS — I517 Cardiomegaly: Secondary | ICD-10-CM | POA: Diagnosis not present

## 2016-03-23 DIAGNOSIS — I6523 Occlusion and stenosis of bilateral carotid arteries: Secondary | ICD-10-CM | POA: Diagnosis not present

## 2016-03-23 DIAGNOSIS — I639 Cerebral infarction, unspecified: Secondary | ICD-10-CM | POA: Diagnosis not present

## 2016-03-29 DIAGNOSIS — G8929 Other chronic pain: Secondary | ICD-10-CM | POA: Diagnosis not present

## 2016-03-29 DIAGNOSIS — M5442 Lumbago with sciatica, left side: Secondary | ICD-10-CM | POA: Diagnosis not present

## 2016-03-29 DIAGNOSIS — M5137 Other intervertebral disc degeneration, lumbosacral region: Secondary | ICD-10-CM | POA: Diagnosis not present

## 2016-03-29 DIAGNOSIS — M4806 Spinal stenosis, lumbar region: Secondary | ICD-10-CM | POA: Diagnosis not present

## 2016-03-29 DIAGNOSIS — Z4789 Encounter for other orthopedic aftercare: Secondary | ICD-10-CM | POA: Diagnosis not present

## 2016-04-03 DIAGNOSIS — E119 Type 2 diabetes mellitus without complications: Secondary | ICD-10-CM | POA: Diagnosis not present

## 2016-04-03 DIAGNOSIS — Z961 Presence of intraocular lens: Secondary | ICD-10-CM | POA: Diagnosis not present

## 2016-04-03 DIAGNOSIS — M5137 Other intervertebral disc degeneration, lumbosacral region: Secondary | ICD-10-CM | POA: Diagnosis not present

## 2016-04-03 DIAGNOSIS — M4806 Spinal stenosis, lumbar region: Secondary | ICD-10-CM | POA: Diagnosis not present

## 2016-04-03 DIAGNOSIS — G8929 Other chronic pain: Secondary | ICD-10-CM | POA: Diagnosis not present

## 2016-04-03 DIAGNOSIS — H40003 Preglaucoma, unspecified, bilateral: Secondary | ICD-10-CM | POA: Diagnosis not present

## 2016-04-03 DIAGNOSIS — M5442 Lumbago with sciatica, left side: Secondary | ICD-10-CM | POA: Diagnosis not present

## 2016-04-03 DIAGNOSIS — Z4789 Encounter for other orthopedic aftercare: Secondary | ICD-10-CM | POA: Diagnosis not present

## 2016-04-10 ENCOUNTER — Other Ambulatory Visit: Payer: Self-pay

## 2016-04-10 DIAGNOSIS — Z8673 Personal history of transient ischemic attack (TIA), and cerebral infarction without residual deficits: Secondary | ICD-10-CM | POA: Diagnosis not present

## 2016-04-10 DIAGNOSIS — G3109 Other frontotemporal dementia: Secondary | ICD-10-CM | POA: Diagnosis not present

## 2016-04-10 DIAGNOSIS — R251 Tremor, unspecified: Secondary | ICD-10-CM | POA: Diagnosis not present

## 2016-04-10 DIAGNOSIS — R269 Unspecified abnormalities of gait and mobility: Secondary | ICD-10-CM | POA: Diagnosis not present

## 2016-04-10 DIAGNOSIS — G3184 Mild cognitive impairment, so stated: Secondary | ICD-10-CM | POA: Diagnosis not present

## 2016-04-10 DIAGNOSIS — F0281 Dementia in other diseases classified elsewhere with behavioral disturbance: Secondary | ICD-10-CM | POA: Diagnosis not present

## 2016-04-10 DIAGNOSIS — G2111 Neuroleptic induced parkinsonism: Secondary | ICD-10-CM | POA: Diagnosis not present

## 2016-04-10 DIAGNOSIS — G609 Hereditary and idiopathic neuropathy, unspecified: Secondary | ICD-10-CM | POA: Diagnosis not present

## 2016-04-12 DIAGNOSIS — M5441 Lumbago with sciatica, right side: Secondary | ICD-10-CM | POA: Diagnosis not present

## 2016-04-12 DIAGNOSIS — G8929 Other chronic pain: Secondary | ICD-10-CM | POA: Diagnosis not present

## 2016-04-12 DIAGNOSIS — M5442 Lumbago with sciatica, left side: Secondary | ICD-10-CM | POA: Diagnosis not present

## 2016-04-15 DIAGNOSIS — R34 Anuria and oliguria: Secondary | ICD-10-CM | POA: Diagnosis not present

## 2016-04-15 DIAGNOSIS — R339 Retention of urine, unspecified: Secondary | ICD-10-CM | POA: Diagnosis not present

## 2016-04-15 DIAGNOSIS — Z0000 Encounter for general adult medical examination without abnormal findings: Secondary | ICD-10-CM | POA: Diagnosis not present

## 2016-04-24 DIAGNOSIS — R3912 Poor urinary stream: Secondary | ICD-10-CM | POA: Diagnosis not present

## 2016-04-24 DIAGNOSIS — R3915 Urgency of urination: Secondary | ICD-10-CM | POA: Diagnosis not present

## 2016-04-24 DIAGNOSIS — N3000 Acute cystitis without hematuria: Secondary | ICD-10-CM | POA: Diagnosis not present

## 2016-04-24 DIAGNOSIS — R351 Nocturia: Secondary | ICD-10-CM | POA: Diagnosis not present

## 2016-04-24 DIAGNOSIS — N401 Enlarged prostate with lower urinary tract symptoms: Secondary | ICD-10-CM | POA: Diagnosis not present

## 2016-04-24 DIAGNOSIS — R3911 Hesitancy of micturition: Secondary | ICD-10-CM | POA: Diagnosis not present

## 2016-04-25 DIAGNOSIS — M5441 Lumbago with sciatica, right side: Secondary | ICD-10-CM | POA: Diagnosis not present

## 2016-04-27 DIAGNOSIS — G8929 Other chronic pain: Secondary | ICD-10-CM | POA: Diagnosis not present

## 2016-04-27 DIAGNOSIS — M5441 Lumbago with sciatica, right side: Secondary | ICD-10-CM | POA: Diagnosis not present

## 2016-04-27 DIAGNOSIS — M5442 Lumbago with sciatica, left side: Secondary | ICD-10-CM | POA: Diagnosis not present

## 2016-04-27 DIAGNOSIS — M4806 Spinal stenosis, lumbar region: Secondary | ICD-10-CM | POA: Diagnosis not present

## 2016-04-28 DIAGNOSIS — R509 Fever, unspecified: Secondary | ICD-10-CM | POA: Diagnosis not present

## 2016-04-28 DIAGNOSIS — N3000 Acute cystitis without hematuria: Secondary | ICD-10-CM | POA: Diagnosis not present

## 2016-04-28 DIAGNOSIS — R569 Unspecified convulsions: Secondary | ICD-10-CM | POA: Diagnosis not present

## 2016-04-28 DIAGNOSIS — E860 Dehydration: Secondary | ICD-10-CM | POA: Diagnosis not present

## 2016-04-28 DIAGNOSIS — N320 Bladder-neck obstruction: Secondary | ICD-10-CM | POA: Diagnosis present

## 2016-04-28 DIAGNOSIS — A419 Sepsis, unspecified organism: Secondary | ICD-10-CM | POA: Diagnosis not present

## 2016-04-28 DIAGNOSIS — R6520 Severe sepsis without septic shock: Secondary | ICD-10-CM | POA: Diagnosis not present

## 2016-04-28 DIAGNOSIS — I10 Essential (primary) hypertension: Secondary | ICD-10-CM | POA: Diagnosis present

## 2016-04-28 DIAGNOSIS — E7800 Pure hypercholesterolemia, unspecified: Secondary | ICD-10-CM | POA: Diagnosis present

## 2016-04-28 DIAGNOSIS — R339 Retention of urine, unspecified: Secondary | ICD-10-CM | POA: Diagnosis not present

## 2016-04-28 DIAGNOSIS — Z7902 Long term (current) use of antithrombotics/antiplatelets: Secondary | ICD-10-CM | POA: Diagnosis not present

## 2016-04-28 DIAGNOSIS — N390 Urinary tract infection, site not specified: Secondary | ICD-10-CM | POA: Diagnosis not present

## 2016-04-28 DIAGNOSIS — R531 Weakness: Secondary | ICD-10-CM | POA: Diagnosis not present

## 2016-04-28 DIAGNOSIS — K5730 Diverticulosis of large intestine without perforation or abscess without bleeding: Secondary | ICD-10-CM | POA: Diagnosis not present

## 2016-04-28 DIAGNOSIS — E119 Type 2 diabetes mellitus without complications: Secondary | ICD-10-CM | POA: Diagnosis not present

## 2016-04-28 DIAGNOSIS — E039 Hypothyroidism, unspecified: Secondary | ICD-10-CM | POA: Diagnosis present

## 2016-05-02 DIAGNOSIS — R531 Weakness: Secondary | ICD-10-CM | POA: Diagnosis not present

## 2016-05-02 DIAGNOSIS — A419 Sepsis, unspecified organism: Secondary | ICD-10-CM | POA: Diagnosis not present

## 2016-05-02 DIAGNOSIS — R509 Fever, unspecified: Secondary | ICD-10-CM | POA: Diagnosis not present

## 2016-05-02 DIAGNOSIS — G609 Hereditary and idiopathic neuropathy, unspecified: Secondary | ICD-10-CM | POA: Diagnosis not present

## 2016-05-02 DIAGNOSIS — N390 Urinary tract infection, site not specified: Secondary | ICD-10-CM | POA: Diagnosis not present

## 2016-05-02 DIAGNOSIS — E785 Hyperlipidemia, unspecified: Secondary | ICD-10-CM | POA: Diagnosis not present

## 2016-05-02 DIAGNOSIS — F0281 Dementia in other diseases classified elsewhere with behavioral disturbance: Secondary | ICD-10-CM | POA: Diagnosis not present

## 2016-05-02 DIAGNOSIS — I10 Essential (primary) hypertension: Secondary | ICD-10-CM | POA: Diagnosis not present

## 2016-05-02 DIAGNOSIS — R29898 Other symptoms and signs involving the musculoskeletal system: Secondary | ICD-10-CM | POA: Diagnosis not present

## 2016-05-02 DIAGNOSIS — E119 Type 2 diabetes mellitus without complications: Secondary | ICD-10-CM | POA: Diagnosis not present

## 2016-05-02 DIAGNOSIS — R269 Unspecified abnormalities of gait and mobility: Secondary | ICD-10-CM | POA: Diagnosis not present

## 2016-05-02 DIAGNOSIS — R6520 Severe sepsis without septic shock: Secondary | ICD-10-CM | POA: Diagnosis not present

## 2016-05-02 DIAGNOSIS — G3109 Other frontotemporal dementia: Secondary | ICD-10-CM | POA: Diagnosis not present

## 2016-05-02 DIAGNOSIS — Z8673 Personal history of transient ischemic attack (TIA), and cerebral infarction without residual deficits: Secondary | ICD-10-CM | POA: Diagnosis not present

## 2016-05-02 DIAGNOSIS — G2111 Neuroleptic induced parkinsonism: Secondary | ICD-10-CM | POA: Diagnosis not present

## 2016-05-04 DIAGNOSIS — N390 Urinary tract infection, site not specified: Secondary | ICD-10-CM | POA: Diagnosis not present

## 2016-05-04 DIAGNOSIS — A419 Sepsis, unspecified organism: Secondary | ICD-10-CM | POA: Diagnosis not present

## 2016-05-04 DIAGNOSIS — E785 Hyperlipidemia, unspecified: Secondary | ICD-10-CM | POA: Diagnosis not present

## 2016-05-04 DIAGNOSIS — R29898 Other symptoms and signs involving the musculoskeletal system: Secondary | ICD-10-CM | POA: Diagnosis not present

## 2016-05-15 DIAGNOSIS — G3109 Other frontotemporal dementia: Secondary | ICD-10-CM | POA: Diagnosis not present

## 2016-05-15 DIAGNOSIS — G2111 Neuroleptic induced parkinsonism: Secondary | ICD-10-CM | POA: Diagnosis not present

## 2016-05-15 DIAGNOSIS — G609 Hereditary and idiopathic neuropathy, unspecified: Secondary | ICD-10-CM | POA: Diagnosis not present

## 2016-05-15 DIAGNOSIS — R269 Unspecified abnormalities of gait and mobility: Secondary | ICD-10-CM | POA: Diagnosis not present

## 2016-05-15 DIAGNOSIS — F0281 Dementia in other diseases classified elsewhere with behavioral disturbance: Secondary | ICD-10-CM | POA: Diagnosis not present

## 2016-05-15 DIAGNOSIS — Z8673 Personal history of transient ischemic attack (TIA), and cerebral infarction without residual deficits: Secondary | ICD-10-CM | POA: Diagnosis not present

## 2016-05-24 DIAGNOSIS — R2689 Other abnormalities of gait and mobility: Secondary | ICD-10-CM | POA: Diagnosis not present

## 2016-05-24 DIAGNOSIS — M545 Low back pain: Secondary | ICD-10-CM | POA: Diagnosis not present

## 2016-05-24 DIAGNOSIS — M6281 Muscle weakness (generalized): Secondary | ICD-10-CM | POA: Diagnosis not present

## 2016-05-25 DIAGNOSIS — F0390 Unspecified dementia without behavioral disturbance: Secondary | ICD-10-CM | POA: Insufficient documentation

## 2016-05-29 DIAGNOSIS — M6281 Muscle weakness (generalized): Secondary | ICD-10-CM | POA: Diagnosis not present

## 2016-05-29 DIAGNOSIS — M545 Low back pain: Secondary | ICD-10-CM | POA: Diagnosis not present

## 2016-05-29 DIAGNOSIS — R2689 Other abnormalities of gait and mobility: Secondary | ICD-10-CM | POA: Diagnosis not present

## 2016-05-31 DIAGNOSIS — F0390 Unspecified dementia without behavioral disturbance: Secondary | ICD-10-CM | POA: Diagnosis not present

## 2016-05-31 DIAGNOSIS — R410 Disorientation, unspecified: Secondary | ICD-10-CM | POA: Diagnosis not present

## 2016-05-31 DIAGNOSIS — Z6830 Body mass index (BMI) 30.0-30.9, adult: Secondary | ICD-10-CM | POA: Diagnosis not present

## 2016-05-31 DIAGNOSIS — M545 Low back pain: Secondary | ICD-10-CM | POA: Diagnosis not present

## 2016-05-31 DIAGNOSIS — M6281 Muscle weakness (generalized): Secondary | ICD-10-CM | POA: Diagnosis not present

## 2016-05-31 DIAGNOSIS — F329 Major depressive disorder, single episode, unspecified: Secondary | ICD-10-CM | POA: Diagnosis not present

## 2016-05-31 DIAGNOSIS — F3110 Bipolar disorder, current episode manic without psychotic features, unspecified: Secondary | ICD-10-CM | POA: Diagnosis not present

## 2016-05-31 DIAGNOSIS — R2689 Other abnormalities of gait and mobility: Secondary | ICD-10-CM | POA: Diagnosis not present

## 2016-06-06 DIAGNOSIS — R2689 Other abnormalities of gait and mobility: Secondary | ICD-10-CM | POA: Diagnosis not present

## 2016-06-06 DIAGNOSIS — M6281 Muscle weakness (generalized): Secondary | ICD-10-CM | POA: Diagnosis not present

## 2016-06-06 DIAGNOSIS — M545 Low back pain: Secondary | ICD-10-CM | POA: Diagnosis not present

## 2016-06-07 ENCOUNTER — Other Ambulatory Visit: Payer: Self-pay

## 2016-06-07 NOTE — Patient Outreach (Signed)
[facility]) Care Management  06/07/2016  [name] Feb 22, 1941 [id number]  REFERRAL DATE: 06/01/16 REFERRAL SOURCE: Primary MD REFERRAL REASON  Repeated hospitalization due to Dementia, mood disorder  CONSENT: HIPAA consent to speak with [name]  PROVIDER:  Dr. Marton Redwood - primary MD  SUBJECTIVE; Telephone call to [name] as requested by primary MD referral.  [name] requested return call due to being at doctors appointment.    PLAN: RNCM will attempt 2nd telephone outreach within 1 week.   Quinn Plowman RN,BSN,CCM [facility] Telephonic  [phone]

## 2016-06-08 ENCOUNTER — Other Ambulatory Visit: Payer: Self-pay

## 2016-06-08 ENCOUNTER — Other Ambulatory Visit: Payer: Self-pay | Admitting: *Deleted

## 2016-06-08 DIAGNOSIS — M6281 Muscle weakness (generalized): Secondary | ICD-10-CM | POA: Diagnosis not present

## 2016-06-08 DIAGNOSIS — M545 Low back pain: Secondary | ICD-10-CM | POA: Diagnosis not present

## 2016-06-08 DIAGNOSIS — R2689 Other abnormalities of gait and mobility: Secondary | ICD-10-CM | POA: Diagnosis not present

## 2016-06-08 NOTE — Patient Outreach (Signed)
[name]) Care Management  06/08/2016  [name] [birth year] [id number]   REFERRAL DATE: 06/01/16 REFERRAL SOURCE; Primary MD - Dr. Marton Ellison REFERRAL REASON:  Repeated hospitalization due to dementia, mood disorder CONSENT: Per primary MD referral contact and caregiver, [name] is point of contact for patient. [name] is also health care power of attorney.  Health care power of attorney was requested and is filed in patients electronic medical record.   PROVIDERS: Dr. Marton Ellison - primary MD  SOCIAL: Currently living with Finance, [name] who is patients [place] of attorney.   Dependent in care for ADL'S and IADL'S Transportation provided by [facility]  And caregiver  SUBJECTIVE; Telephone call to patient regarding primary MD referral. HIPAA verified by patients caregiver/ healthcare power of attorney/ fiance, [name] for patient. Discussed and offered THn care management services for patient. Caregiver in agreement to receive services for patient.  Caregiver states patient recently discharged from [facility] on May 17, 2016.  States patient was in [facility] due to having a urinary tract infection that turned to sepsis.  Caregiver states patient is currently going to outpatient physical therapy 2 times a week at [facility].  Transportation provided by [facility].  Caregiver states patient is unable to care for himself. States she would like to continue to care for patient in the home.  States she will need additional services to help with this. Caregiver states patient does not qualify for Medicaid.  Caregiver states patient has had several hospitalization due to his medical conditions. Caregiver states patient is status post stroke in August 2017.  Caregiver states patient has been inpatient at behavioral health for 2 weeks this year due to depression.  Caregiver states patient was living in an assisted living for 5  months prior to coming to live with her.  Caregiver states patient is diabetic and on oral medication.  Reports patients fasting blood sugar for today was 180. States patient also has dementia and kidney failure.  Caregiver states patient has had 2 falls within the past 3 months. Caregiver states she has to call her son or Emergency medical services for assistance to get patient up from fall. Caregiver states patient walks with a walker and shuffles. Caregiver states patient does not sleep well and paces a lot at night. Caregiver reports she has just started giving patient melatonin at bedtime.   Caregiver states patient has a follow up visit with his primary MD on June 26, 2016 and has an appointment to see a cardiologist on 06/21/16.  Caregiver states patient has  Sleep apnea and uses CPAP machine nightly. Caregiver states she also administers patient a B12 shot monthly. Caregiver agrees to follow up with community case Freight forwarder, Education officer, museum, and pharmacist with [facility] care management.  ASSESSMENT:   Problems per primary MD note on 06/01/16: Dementia, Bipolar affective disorder, manic, diabetes type 2 with renal complications, obstructive sleep apnea, hypertension, depression/anxiety  PLAN:  RNCM referred patient to community case Freight forwarder, Education officer, museum, and pharmacy.   Quinn Plowman RN,BSN,CCM [name] Telephonic  [phone]

## 2016-06-12 DIAGNOSIS — M6281 Muscle weakness (generalized): Secondary | ICD-10-CM | POA: Diagnosis not present

## 2016-06-12 DIAGNOSIS — M545 Low back pain: Secondary | ICD-10-CM | POA: Diagnosis not present

## 2016-06-12 DIAGNOSIS — R2689 Other abnormalities of gait and mobility: Secondary | ICD-10-CM | POA: Diagnosis not present

## 2016-06-13 ENCOUNTER — Other Ambulatory Visit: Payer: Self-pay

## 2016-06-13 NOTE — Patient Outreach (Signed)
New referral: Placed call to caregiver who is documented power of attorney; Lorretta Linebarrier. She reports that she is in MD office and can not talk and call me back later.  PLAN: Will await a call back. If no response will attempt again.  [name], RN, BSN, CEN [facility] [employer] [phone]

## 2016-06-15 ENCOUNTER — Other Ambulatory Visit: Payer: Self-pay

## 2016-06-15 NOTE — Patient Outreach (Signed)
TELEPHONE SCREENING:   Placed call to caregiver [name].  Explained purpose of call.  [name] states that she needs assistance in the home with care giving so she can get a break.  Reports that she is not able to speak to me but a second. States for me to call her back on 07/05/2016 and she will see if she wants a home assessment. At this time is not interested in services until atleast 07/05/2016.  PLAN: Will call patient/caregiver back after 07/05/2016.  NOTE: No assessments completed as patient and or caregiver did not have time to talk on the phone. Refused any other contact until after 07/05/2016.  [name], RN, BSN, CEN [facility] [employer] [phone]

## 2016-06-19 DIAGNOSIS — G609 Hereditary and idiopathic neuropathy, unspecified: Secondary | ICD-10-CM | POA: Diagnosis not present

## 2016-06-19 DIAGNOSIS — Z8673 Personal history of transient ischemic attack (TIA), and cerebral infarction without residual deficits: Secondary | ICD-10-CM | POA: Diagnosis not present

## 2016-06-19 DIAGNOSIS — G3109 Other frontotemporal dementia: Secondary | ICD-10-CM | POA: Diagnosis not present

## 2016-06-19 DIAGNOSIS — G479 Sleep disorder, unspecified: Secondary | ICD-10-CM | POA: Diagnosis not present

## 2016-06-19 DIAGNOSIS — F0281 Dementia in other diseases classified elsewhere with behavioral disturbance: Secondary | ICD-10-CM | POA: Diagnosis not present

## 2016-06-19 DIAGNOSIS — R269 Unspecified abnormalities of gait and mobility: Secondary | ICD-10-CM | POA: Diagnosis not present

## 2016-06-19 DIAGNOSIS — G2111 Neuroleptic induced parkinsonism: Secondary | ICD-10-CM | POA: Diagnosis not present

## 2016-06-26 DIAGNOSIS — J45909 Unspecified asthma, uncomplicated: Secondary | ICD-10-CM | POA: Diagnosis not present

## 2016-06-26 DIAGNOSIS — F0390 Unspecified dementia without behavioral disturbance: Secondary | ICD-10-CM | POA: Diagnosis not present

## 2016-06-26 DIAGNOSIS — Z6829 Body mass index (BMI) 29.0-29.9, adult: Secondary | ICD-10-CM | POA: Diagnosis not present

## 2016-06-26 DIAGNOSIS — E784 Other hyperlipidemia: Secondary | ICD-10-CM | POA: Diagnosis not present

## 2016-06-26 DIAGNOSIS — F3110 Bipolar disorder, current episode manic without psychotic features, unspecified: Secondary | ICD-10-CM | POA: Diagnosis not present

## 2016-06-26 DIAGNOSIS — M6281 Muscle weakness (generalized): Secondary | ICD-10-CM | POA: Diagnosis not present

## 2016-06-26 DIAGNOSIS — I10 Essential (primary) hypertension: Secondary | ICD-10-CM | POA: Diagnosis not present

## 2016-06-26 DIAGNOSIS — E538 Deficiency of other specified B group vitamins: Secondary | ICD-10-CM | POA: Diagnosis not present

## 2016-06-26 DIAGNOSIS — E1129 Type 2 diabetes mellitus with other diabetic kidney complication: Secondary | ICD-10-CM | POA: Diagnosis not present

## 2016-06-27 DIAGNOSIS — Z23 Encounter for immunization: Secondary | ICD-10-CM | POA: Diagnosis not present

## 2016-06-27 DIAGNOSIS — M545 Low back pain: Secondary | ICD-10-CM | POA: Diagnosis not present

## 2016-06-27 DIAGNOSIS — R2689 Other abnormalities of gait and mobility: Secondary | ICD-10-CM | POA: Diagnosis not present

## 2016-06-27 DIAGNOSIS — M6281 Muscle weakness (generalized): Secondary | ICD-10-CM | POA: Diagnosis not present

## 2016-06-29 ENCOUNTER — Other Ambulatory Visit: Payer: Self-pay

## 2016-06-29 ENCOUNTER — Other Ambulatory Visit: Payer: Self-pay | Admitting: *Deleted

## 2016-06-29 DIAGNOSIS — G2111 Neuroleptic induced parkinsonism: Secondary | ICD-10-CM | POA: Diagnosis not present

## 2016-06-29 DIAGNOSIS — F329 Major depressive disorder, single episode, unspecified: Secondary | ICD-10-CM | POA: Diagnosis not present

## 2016-06-29 DIAGNOSIS — G309 Alzheimer's disease, unspecified: Secondary | ICD-10-CM | POA: Diagnosis not present

## 2016-06-29 DIAGNOSIS — E782 Mixed hyperlipidemia: Secondary | ICD-10-CM | POA: Diagnosis not present

## 2016-06-29 DIAGNOSIS — E119 Type 2 diabetes mellitus without complications: Secondary | ICD-10-CM | POA: Diagnosis not present

## 2016-06-29 DIAGNOSIS — F0280 Dementia in other diseases classified elsewhere without behavioral disturbance: Secondary | ICD-10-CM | POA: Diagnosis not present

## 2016-06-29 DIAGNOSIS — N183 Chronic kidney disease, stage 3 (moderate): Secondary | ICD-10-CM | POA: Diagnosis not present

## 2016-06-29 DIAGNOSIS — M48061 Spinal stenosis, lumbar region without neurogenic claudication: Secondary | ICD-10-CM | POA: Diagnosis not present

## 2016-06-29 DIAGNOSIS — I10 Essential (primary) hypertension: Secondary | ICD-10-CM | POA: Diagnosis not present

## 2016-06-29 NOTE — Patient Outreach (Signed)
Care coordination:  Received call from [facility] social worker Eduard Clos stating patient has agreed to a joint home visit and assessment.  Appointment made for 07/13/2016. Appointment date and time communicated with [facility] social worker and patient.  PLAN: Booked appointment for 07/13/2016.  [name], RN, BSN, CEN [facility] [employer] [phone]

## 2016-06-29 NOTE — Patient Outreach (Signed)
[facility]) Care Ellison  06/29/2016  [name] Feb 02, 1941 [id number]   CSW was able to make contact with patient and his 58, [name], today.   CSW introduced self, explained role and types of services provided through [name] ([place] [facility]).  CSW further explained that CSW works with patient's RNCM, also with [name].  CSW was able to confirm and plan visit for [name] and CSW to visit together on 07/03/16. Patient's HCPOA, [name], reports she had lost our number and had gottenit form [name] to callback.   Visit planned for CSW and RNCM to visit 11/30 for initial home assessment.   [name], MSW, [facility] Worker  [place] [phone]

## 2016-07-05 ENCOUNTER — Ambulatory Visit: Payer: Medicare Other

## 2016-07-10 DIAGNOSIS — G459 Transient cerebral ischemic attack, unspecified: Secondary | ICD-10-CM | POA: Diagnosis not present

## 2016-07-13 ENCOUNTER — Other Ambulatory Visit: Payer: Self-pay | Admitting: *Deleted

## 2016-07-13 ENCOUNTER — Other Ambulatory Visit: Payer: Self-pay

## 2016-07-13 ENCOUNTER — Ambulatory Visit: Payer: Self-pay

## 2016-07-13 NOTE — Patient Outreach (Signed)
[place] [facility]) Care Management   07/13/2016  CHELSEA NUSZ 08/02/1941 [id number]  DERRIOUS BOLOGNA is an 75 y.o. male Referral from MD office for memory problems.  Arrived for home visit.  Care Giver [name] present and engaged during home visit.  Joint visit with [facility] social worker Eduard Clos. Subjective: Patient reports that he is depressed and is having memory problems.  States that his DM is doing well. Reports that CBG is under 130 daily.  Patient reports that he is not able to do anything for himself except brush teeth and shave.  Reports that he often has accidents.  Wears diapers.  Caregiver reports that patient does not sleep. Reports that he starts to roam the house about 10 pm every night.  Caregiver states that patient was naked this morning with a blanket over his head.  Also reports that 2 nights ago patient got up and opened some dog food and ate it.  Caregiver also reports that patient mixed [place] dressing in the tea picture in the refrigerator. Patient does not remember any of these events. Reports patient has frequent falls and that he shuffles his feet when he walk.  States that patient was active with PT but then it became a struggle to take patient out of the home. Caregiver reports that patient was found walking down the road on night not to long ago. Caregiver put locks on the door where patient can not reach.  Patient reports to me that he has been very depressed and has felt suicidal in the past with a plan to drink antifreeze. Denies suicidal thoughts recently. Caregiver states that she keeps harmful items put up where patient can not get to them. Caregiver reports she assist patient with all ADLS and IADLS.  Patient reports that he gets bored easily but enjoys watching games shows like Jeopardy. Caregiver has hired help 2 days a week so she can continue to work.   Objective:  Vitals:   07/13/16 1232  BP: (!) 104/58  Pulse: (!) 58  Resp: 18   SpO2: 97%  Weight: 187 lb (84.8 kg)  Height: 1.727 m (5\' 8" )   Awake and alert at times during home visit. Patient falling asleep nurmerous times during home visit.  After 1.5 hours of visit patient able to recall my name and social workers name correctly.  Patient walks with a shuffled gait.  Ambulates better with walker and need verbal clues to pick up his feet.  Home neat and clean. Patient well groomed.  Review of Systems  Constitutional: Positive for malaise/fatigue and weight loss.  HENT: Negative.   Eyes: Negative.   Respiratory: Positive for cough.   Cardiovascular: Positive for leg swelling.  Gastrointestinal: Positive for diarrhea and heartburn.  Genitourinary: Negative.   Musculoskeletal: Positive for back pain and falls.       Reports occasional lower back pain  Skin: Negative.   Neurological:       Shuffling gait.  Endo/Heme/Allergies: Bruises/bleeds easily.  Psychiatric/Behavioral: Positive for depression, memory loss and suicidal ideas. The patient has insomnia.        Reports short term memory loss. Reports not suicidal     Physical Exam  Constitutional: He is oriented to person, place, and time. He appears well-developed and well-nourished.  Cardiovascular: Normal rate, normal heart sounds and intact distal pulses.   Respiratory: Effort normal and breath sounds normal.  Dry cough noted  GI: Soft. Bowel sounds are normal.  Musculoskeletal: Normal range of  motion. He exhibits edema.  2 plus edema above sock line both legs  Neurological: He is alert and oriented to person, place, and time.  Unable to remember what he ate for breakfast or events of the morning.  Skin: Skin is warm and dry.  Psychiatric: He has a normal mood and affect. His behavior is normal.  Cooperative and pleasant during home visit    Encounter Medications:   Outpatient Encounter Prescriptions as of 07/13/2016  Medication Sig Note  . albuterol (PROAIR HFA) 108 (90 BASE) MCG/ACT inhaler  Inhale 2 puffs into the lungs every 4 (four) hours as needed for wheezing or shortness of breath.    Marland Kitchen amLODipine (NORVASC) 10 MG tablet Take 10 mg by mouth daily.   Marland Kitchen atorvastatin (LIPITOR) 10 MG tablet Take 10 mg by mouth daily.     Marland Kitchen bismuth subsalicylate (PEPTO BISMOL) 262 MG/15ML suspension Take 30 mLs by mouth 2 (two) times daily as needed for indigestion.    . Calcium Carbonate Antacid (TUMS PO) Take 4 tablets by mouth 4 (four) times daily as needed (heartburn). Reported on 10/06/2015   . cyanocobalamin (,VITAMIN B-12,) 1000 MCG/ML injection Inject 1,000 mcg into the muscle every 30 (thirty) days.   . Fluticasone-Salmeterol (ADVAIR) 250-50 MCG/DOSE AEPB Inhale 1 puff into the lungs daily.   Marland Kitchen MELATONIN PO Take 5 mg by mouth. 2 tablets at bedtime   . memantine (NAMENDA) 10 MG tablet Take 10 mg by mouth 2 (two) times daily. Reported on 10/06/2015   . methocarbamol (ROBAXIN) 500 MG tablet Take 1 tablet (500 mg total) by mouth every 6 (six) hours as needed for muscle spasms.   . Multiple Vitamin (MULTIVITAMIN PO) Take 1 tablet by mouth daily.    Marland Kitchen omeprazole (PRILOSEC) 40 MG capsule Take 40 mg by mouth at bedtime as needed (heartburn).    Marland Kitchen oxyCODONE-acetaminophen (PERCOCET/ROXICET) 5-325 MG tablet Take 1-2 tablets by mouth every 4 (four) hours as needed for moderate pain.   Marland Kitchen QUEtiapine (SEROQUEL) 50 MG tablet Take 50 mg by mouth at bedtime. Reported on 10/06/2015   . sertraline (ZOLOFT) 100 MG tablet Take 100 mg by mouth at bedtime.   . sitaGLIPtin (JANUVIA) 100 MG tablet Take 100 mg by mouth daily. Takes 1/2 tab   . tamsulosin (FLOMAX) 0.4 MG CAPS capsule Take 0.4 mg by mouth daily.   . temazepam (RESTORIL) 15 MG capsule Take 15 mg by mouth at bedtime. Reported on 10/06/2015   . traZODone (DESYREL) 50 MG tablet Take 50 mg by mouth at bedtime. Reported on 10/06/2015   . valsartan (DIOVAN) 80 MG tablet Take 80 mg by mouth daily. Reported on 10/06/2015   . aspirin 325 MG tablet Take 1 tablet (325 mg  total) by mouth daily. (Patient not taking: Reported on 07/13/2016)   . divalproex (DEPAKOTE ER) 500 MG 24 hr tablet Take 1,000 mg by mouth at bedtime.  07/13/2016: Is currently taking 2 tablets at bedtime   No facility-administered encounter medications on file as of 07/13/2016.     Functional Status:   In your present state of health, do you have any difficulty performing the following activities: 07/13/2016 08/04/2015  Hearing? Y N  Vision? N N  Difficulty concentrating or making decisions? Y N  Walking or climbing stairs? Y N  Dressing or bathing? Y N  Doing errands, shopping? Y N  Preparing Food and eating ? Y -  Using the Toilet? Y -  In the past six months, have you accidently  leaked urine? Y -  Do you have problems with loss of bowel control? Y -  Managing your Medications? Y -  Managing your Finances? N -  Some recent data might be hidden    Fall/Depression Screening:    PHQ 2/9 Scores 07/13/2016  PHQ - 2 Score 6  PHQ- 9 Score 27   Fall Risk  07/13/2016  Falls in the past year? Yes  Number falls in past yr: 2 or more  Injury with Fall? Yes  Risk Factor Category  High Fall Risk  Risk for fall due to : History of fall(s)  Follow up Falls prevention discussed   Assessment:   (1) reviewed [facility] program. New patient packet provided. Magnet and contact information provided. Reviewed consent with patient and caregiver.  Consent signed by caregiver for is health care power of attorney at patients request.  (2) positive fall risk. Numerous falls.  Unsteady shuffling gait. (3) positive depression screening. Patient does not like his psychiatrist and has not seen her in 3 months. Request assistance finding another psychriatrist.  (3) swelling to lower legs (4) reports DM under good control. (5) recent weight loss due to lack of appetite. However patient states that he would still like to lose more weight. (6) unable to sleep at night/ roaming.  Unable to remember recent  events. (7) reports boredom. (8) dependent of ADLS and IADLS.  Plan:  (1) [facility] consent scanned into chart. (2)  Reviewed fall precautions.  Reviewed importance of using walker, wearing non slip sock and shoes.  Reviewed interested in PT and OT.  (3) [facility] social worker active working with patient. Will notify MD of depression screening. Encouraged patient to find something that interest him like coloring, helping fold laundry. (4) encouraged patient to continue to monitor CBG and report abnormal readings to MD. Reviewed signs and symptoms of low and high blood sugar. Provided [facility] DM packet for reading. (5)Encouraged patient to eat more protein and frequent snacks.  Encouraged caregiver to continue to monitor weight loss and report concerns to MD. (6) reviewed importance of patient staying awake during the day and try to sleep better at night. Encouraged caregiver to give patient task.  Encouraged patient to be more active in hopes this will help him sleep at night.   (7) encouraged activities to keep patient active.  [place] social worker to discuss options available with patient and wife. (8) [facility] social worker to assist with community resources.    Care planning and goal setting during home visit and patient and caregiver decline any nursing goals.  Declines need for a nurse and feels like they need social work assistance.  Nursing will sign off and patient will remain active with [facility] social worker.  No nursing care plan.  Will notify MD and send this note to MD about assessments.  Discussed with Eduard Clos [facility] social worker) that nursing is closing and she is in agreement and will manage patient moving forward.   [name], RN, BSN, CEN [facility] [employer] [phone]

## 2016-07-13 NOTE — Patient Outreach (Signed)
[place] [facility]) Care Management  [facility] Social Work  07/13/2016  [name] 18-Jul-1941 [id number]  Subjective:  " I need help with my depression/anxiety."  Objective: Patient very nice and appropriate.   Encounter Medications:  Outpatient Encounter Prescriptions as of 07/13/2016  Medication Sig Note  . albuterol (PROAIR HFA) 108 (90 BASE) MCG/ACT inhaler Inhale 2 puffs into the lungs every 4 (four) hours as needed for wheezing or shortness of breath.    Marland Kitchen amLODipine (NORVASC) 10 MG tablet Take 10 mg by mouth daily.   Marland Kitchen aspirin 325 MG tablet Take 1 tablet (325 mg total) by mouth daily. (Patient not taking: Reported on 07/13/2016)   . atorvastatin (LIPITOR) 10 MG tablet Take 10 mg by mouth daily.     Marland Kitchen bismuth subsalicylate (PEPTO BISMOL) 262 MG/15ML suspension Take 30 mLs by mouth 2 (two) times daily as needed for indigestion.    . Calcium Carbonate Antacid (TUMS PO) Take 4 tablets by mouth 4 (four) times daily as needed (heartburn). Reported on 10/06/2015   . cyanocobalamin (,VITAMIN B-12,) 1000 MCG/ML injection Inject 1,000 mcg into the muscle every 30 (thirty) days.   . divalproex (DEPAKOTE ER) 500 MG 24 hr tablet Take 1,000 mg by mouth at bedtime.  07/13/2016: Is currently taking 2 tablets at bedtime  . Fluticasone-Salmeterol (ADVAIR) 250-50 MCG/DOSE AEPB Inhale 1 puff into the lungs daily.   Marland Kitchen MELATONIN PO Take 5 mg by mouth. 2 tablets at bedtime   . memantine (NAMENDA) 10 MG tablet Take 10 mg by mouth 2 (two) times daily. Reported on 10/06/2015   . methocarbamol (ROBAXIN) 500 MG tablet Take 1 tablet (500 mg total) by mouth every 6 (six) hours as needed for muscle spasms.   . Multiple Vitamin (MULTIVITAMIN PO) Take 1 tablet by mouth daily.    Marland Kitchen omeprazole (PRILOSEC) 40 MG capsule Take 40 mg by mouth at bedtime as needed (heartburn).    Marland Kitchen oxyCODONE-acetaminophen (PERCOCET/ROXICET) 5-325 MG tablet Take 1-2 tablets by mouth every 4 (four) hours as needed for moderate  pain.   Marland Kitchen QUEtiapine (SEROQUEL) 50 MG tablet Take 50 mg by mouth at bedtime. Reported on 10/06/2015   . sertraline (ZOLOFT) 100 MG tablet Take 100 mg by mouth at bedtime.   . sitaGLIPtin (JANUVIA) 100 MG tablet Take 100 mg by mouth daily. Takes 1/2 tab   . tamsulosin (FLOMAX) 0.4 MG CAPS capsule Take 0.4 mg by mouth daily.   . temazepam (RESTORIL) 15 MG capsule Take 15 mg by mouth at bedtime. Reported on 10/06/2015   . traZODone (DESYREL) 50 MG tablet Take 50 mg by mouth at bedtime. Reported on 10/06/2015   . valsartan (DIOVAN) 80 MG tablet Take 80 mg by mouth daily. Reported on 10/06/2015    No facility-administered encounter medications on file as of 07/13/2016.     Functional Status:  In your present state of health, do you have any difficulty performing the following activities: 07/13/2016 08/04/2015  Hearing? Y N  Vision? N N  Difficulty concentrating or making decisions? Y N  Walking or climbing stairs? Y N  Dressing or bathing? Y N  Doing errands, shopping? Y N  Preparing Food and eating ? Y -  Using the Toilet? Y -  In the past six months, have you accidently leaked urine? Y -  Do you have problems with loss of bowel control? Y -  Managing your Medications? Y -  Managing your Finances? N -  Some recent data might be hidden  Fall/Depression Screening:  PHQ 2/9 Scores 07/13/2016  PHQ - 2 Score 6  PHQ- 9 Score 27    Assessment:  CSW met with patient, his fiance and [name], [place], today to complete initial assessment. Patient acknowledges that he has depression and has had suicidal ideations, as well as anxiety. He would like to be linked with a new mental health provider and agrees to [place] in an appointments for Psychiatrist and counselor.   Plan:   CSW will make appointments with mental health providers and report back to patient and fiance. Marland Kitchenthncmp    Eduard Clos, MSW, [place] Worker  [place] [phone]

## 2016-07-14 ENCOUNTER — Ambulatory Visit: Payer: Self-pay | Admitting: *Deleted

## 2016-07-14 NOTE — Patient Outreach (Signed)
[facility]) Care Management  07/14/2016  [name] 07-29-1941 [id number]   Request received from Eduard Clos, LCSW  To mail patient information on [facility], Private duty list and assisted living list. Information mailed today, 07/14/16  [name], [place] Management Assistant

## 2016-07-19 ENCOUNTER — Ambulatory Visit: Payer: Self-pay | Admitting: *Deleted

## 2016-08-08 DIAGNOSIS — R001 Bradycardia, unspecified: Secondary | ICD-10-CM | POA: Diagnosis not present

## 2016-08-10 ENCOUNTER — Other Ambulatory Visit: Payer: Self-pay | Admitting: Pharmacist

## 2016-08-10 NOTE — Patient Outreach (Signed)
[place] [facility]) Care Management  08/10/2016  [name] [birth year] [id number]  75 year old male referred to [name] for polypharmacy >15 medications for medication review.  Called patients healthcare power of attorney [name] today and she states she assists with patients medications and leaves them out for him to take.  She reports he has good adherence to his medications but she is unable to get up and review them with me today due to having an epidural done earlier today.  She requests that I call back next week to review his medications over the telephone.  Patients caregiver also reports patient has not had a prescription drug plan but has signed up for a [facility] plan with a $400 deductible.  She states he has had trouble affording his medications this year due to lack of prescription drug coverage.    Plan: Followup telephone call next week to assess medication management and adherence Will instruct patient that [facility] mail order for Tier 1 and 2 medications (90 day supply) is $0 copay even in deductible phase   [name], PharmD, [place] PGY2 Pharmacy Resident [phone]

## 2016-08-16 ENCOUNTER — Other Ambulatory Visit: Payer: Self-pay | Admitting: Pharmacist

## 2016-08-16 NOTE — Patient Outreach (Signed)
[place] [facility]) Care Management  08/16/2016  [name] 04-Jun-1941 [id number]  76 year old male referred to [place] for polypharmacy >15 medications for medication review.  Called patients healthcare power of attorney [name] who assists patients with his medications.  She reports she is not at patients house and is picking up his medications from different pharmacies.  Patient has signed up [facility] [place] Medicare Part D coverage but patient cannot afford the ~$400 deductible so she is "pharmacy shopping" for lowest cash price of medications.  She was unable to review medications over the phone due to driving.    Plan:  -Instructed patients caregiver/healthcare power of attorney to ask pharmacy to run medications on Part D plan to determine copay amount in the deductible phase.  Informed her that many of his medications may be covered without having to pay full cost depending on medication Tiers.   -Reviewed patients Medicare Part D plan which showed (copays post deductible)  Tier 1 and Tier 2 Generic 90 day supply $0 copay thru mailorder and 15% of cost   30 day supply Preferred Retail Pharm[facility] Tier 1 Generic $0 copay, Tier 2 Generic $1 copay, Tier 3 20%  of cost  90 day supply Preferred Retail Pharmacy Tier 1 Generic $0 copay, Tier 2 Generic $3 copay, Tier 3 20%  of cost   Tier 1: Trazodone   Tier 2 Atorvastatin, Amlodipine, Depakote ER, memantine, omeprazole, quetiapine, sertraline,  tamsulosin, temazepam, valsartan   Tier 3 albuterol, Breo Ellipta preferred over Advair (Advair is nonformulary), Percocet, Januvia   Tier 4 methocarbamol (baclofen tier 2)   -Will followup later this week with patients healthcare power of attorney and review medications and notify of possible medication assistance options.  [name], PharmD, [place] PGY2 Pharmacy Resident [phone]

## 2016-08-18 ENCOUNTER — Other Ambulatory Visit: Payer: Self-pay | Admitting: Pharmacist

## 2016-08-18 NOTE — Patient Outreach (Signed)
[facility]) Care Management  [place]   08/18/2016  [name] 03/19/1941 [id number]  Subjective: 76 year old male referred to [facility] for polypharmacy >15 medications for medication review.  Called patients healthcare power of attorney [name] today and she states she assists with patients medications and leaves them out for him to take.  [name] states patient no longer needs medication assistance as his copays are $5-8 with patients new [employer] Part D plan.  She states she will be able to afford this especially after the $400 deductible when his medications will be $4.  [name] and her daughter assist patient with medications and deny side effects or questions about patients medications. They do state patient is using tums "like candy" and has persistent heartburn despite Tums and Prilosec.  They state patients primary care is aware of this heartburn issue. She reports patient is having trouble sleeping at night despite the use of melatonin.   All medications have been reviewed with patients healthcare power of attorney who assists patient with his medications.    Objective:  Lipid Panel completed 06/26/2016 HDL 47.000 % 06/26/2016 LDL 25.000 mg 06/26/2016 Cholesterol, total 82.000 mg 06/26/2016 Triglycerides 51.000 06/26/2016 A1C 5.900 % 06/26/2016 Glucose Random 108.000 m 06/26/2016 MicroAlbumin Urine 42.100 09/10/2012 MicroAlbumin/Creat 15.100 mc 06/26/2016 BUN 22.000 mg 06/26/2016 Creatinine, Serum 1.300 mg/ 06/26/2016 TSH 0.130 micr 06/26/2016   Encounter Medications: Outpatient Encounter Prescriptions as of 08/18/2016  Medication Sig Note  . albuterol (PROAIR HFA) 108 (90 BASE) MCG/ACT inhaler Inhale 2 puffs into the lungs every 4 (four) hours as needed for wheezing or shortness of breath.    Marland Kitchen amLODipine (NORVASC) 10 MG tablet Take 10 mg by mouth daily.   . ARIPiprazole (ABILIFY) 10 MG tablet Take 10 mg by mouth daily.   Marland Kitchen  atorvastatin (LIPITOR) 10 MG tablet Take 10 mg by mouth every other day.    . bismuth subsalicylate (PEPTO BISMOL) 262 MG/15ML suspension Take 30 mLs by mouth 2 (two) times daily as needed for indigestion.    . Calcium Carbonate Antacid (TUMS PO) Take 4 tablets by mouth 4 (four) times daily as needed (heartburn). Reported on 10/06/2015   . clopidogrel (PLAVIX) 75 MG tablet Take 75 mg by mouth daily.   . cyanocobalamin (,VITAMIN B-12,) 1000 MCG/ML injection Inject 1,000 mcg into the muscle every 30 (thirty) days.   . divalproex (DEPAKOTE ER) 500 MG 24 hr tablet Take 500 mg by mouth 2 (two) times daily.   . Fluticasone-Salmeterol (ADVAIR) 250-50 MCG/DOSE AEPB Inhale 1 puff into the lungs daily.   . folic acid (FOLVITE) 1 MG tablet Take 1 mg by mouth daily.   Marland Kitchen MELATONIN PO Take 5 mg by mouth. 2 tablets at bedtime   . memantine (NAMENDA) 10 MG tablet Take 10 mg by mouth 2 (two) times daily. Reported on 10/06/2015   . metFORMIN (GLUCOPHAGE) 500 MG tablet Take 500 mg by mouth 2 (two) times daily with a meal.   . Multiple Vitamin (MULTIVITAMIN PO) Take 1 tablet by mouth daily.    Marland Kitchen omeprazole (PRILOSEC) 40 MG capsule Take 40 mg by mouth daily.    Marland Kitchen oxycodone (OXY-IR) 5 MG capsule Take 5 mg by mouth every 4 (four) hours as needed.   Marland Kitchen QUEtiapine (SEROQUEL) 50 MG tablet Take 100 mg by mouth at bedtime. Reported on 10/06/2015   . tamsulosin (FLOMAX) 0.4 MG CAPS capsule Take 0.4 mg by mouth daily.   . temazepam (RESTORIL) 15 MG  capsule Take 15 mg by mouth at bedtime. Reported on 10/06/2015   . traMADol (ULTRAM) 50 MG tablet Take 50 mg by mouth every 6 (six) hours as needed.   . traZODone (DESYREL) 50 MG tablet Take 100 mg by mouth at bedtime. Reported on 10/06/2015   . [DISCONTINUED] valsartan (DIOVAN) 80 MG tablet Take 80 mg by mouth daily. Reported on 10/06/2015   . [DISCONTINUED] aspirin 325 MG tablet Take 1 tablet (325 mg total) by mouth daily. (Patient not taking: Reported on 07/13/2016)   . [DISCONTINUED]  divalproex (DEPAKOTE ER) 500 MG 24 hr tablet Take 500 mg by mouth 2 (two) times daily.  07/13/2016: Is currently taking 2 tablets at bedtime  . [DISCONTINUED] methocarbamol (ROBAXIN) 500 MG tablet Take 1 tablet (500 mg total) by mouth every 6 (six) hours as needed for muscle spasms.   . [DISCONTINUED] oxyCODONE-acetaminophen (PERCOCET/ROXICET) 5-325 MG tablet Take 1-2 tablets by mouth every 4 (four) hours as needed for moderate pain.   . [DISCONTINUED] sertraline (ZOLOFT) 100 MG tablet Take 100 mg by mouth at bedtime.   . [DISCONTINUED] sitaGLIPtin (JANUVIA) 100 MG tablet Take 100 mg by mouth daily. Takes 1/2 tab    No facility-administered encounter medications on file as of 08/18/2016.     Functional Status: In your present state of health, do you have any difficulty performing the following activities: 07/13/2016  Hearing? Y  Vision? N  Difficulty concentrating or making decisions? Y  Walking or climbing stairs? Y  Dressing or bathing? Y  Doing errands, shopping? Y  Preparing Food and eating ? Y  Using the Toilet? Y  In the past six months, have you accidently leaked urine? Y  Do you have problems with loss of bowel control? Y  Managing your Medications? Y  Managing your Finances? N  Some recent data might be hidden    Fall/Depression Screening: PHQ 2/9 Scores 07/13/2016  PHQ - 2 Score 6  PHQ- 9 Score 27    Assessment: Drugs sorted by system: Neurologic/Psychologic: aripiprazole, Depakote ER, melatonin, memantine, quetiapine, temazepam, trazodone  Cardiovascular: amlodipine, atorvastatin, clopidogrel, valsartan   Pulmonary/Allergy: albuterol, Advair   Gastrointestinal: bismuth subsalicylate, calcium carbonate, omeprazole   Endocrine: metformin  Renal: tamsulosin  Pain: oxycodone, tramadol   Vitamins/Minerals: vitamin B35 injections, folic acid, multivitamin  Duplications in therapy: oxycodone and tramadol  Gaps in therapy: patient may need H2 Receptor antagonist  for acid reflux  Medications to avoid in the elderly: temazepam, oxycodone  Medication Assistance: Patient recently enrolled in [facility] Medicare Part D plan and healthcare power of attorney states they no longer need medication assistance as she is now able to afford $5-8 copays.  Medication Management: Caregiver states patient is using tums "like candy" several times per day and has acid reflux symptoms despite the use of omeprazole 40 mg daily.    Plan: Medications were reviewed with patient over the phone.   Will contact patients primary care provider to consider addition of H2 receptor antagonist in addition to omeprazole to better control acid reflux. [facility] pharmacy will sign off as patients caregiver denies difficulty managing patients medications and medication assistance issues have been resolved.  Please reconsult if needed.  [name], PharmD, [place] PGY2 Pharmacy Resident [phone]

## 2016-08-21 NOTE — Patient Outreach (Signed)
[name] PharmD has closed this case with Pharmacy.

## 2016-08-28 DIAGNOSIS — E1129 Type 2 diabetes mellitus with other diabetic kidney complication: Secondary | ICD-10-CM | POA: Diagnosis not present

## 2016-08-28 DIAGNOSIS — Z125 Encounter for screening for malignant neoplasm of prostate: Secondary | ICD-10-CM | POA: Diagnosis not present

## 2016-08-28 DIAGNOSIS — E784 Other hyperlipidemia: Secondary | ICD-10-CM | POA: Diagnosis not present

## 2016-08-28 DIAGNOSIS — E538 Deficiency of other specified B group vitamins: Secondary | ICD-10-CM | POA: Diagnosis not present

## 2016-08-28 DIAGNOSIS — Z0000 Encounter for general adult medical examination without abnormal findings: Secondary | ICD-10-CM | POA: Diagnosis not present

## 2016-08-28 DIAGNOSIS — I10 Essential (primary) hypertension: Secondary | ICD-10-CM | POA: Diagnosis not present

## 2016-09-04 DIAGNOSIS — E1129 Type 2 diabetes mellitus with other diabetic kidney complication: Secondary | ICD-10-CM | POA: Diagnosis not present

## 2016-09-04 DIAGNOSIS — N183 Chronic kidney disease, stage 3 (moderate): Secondary | ICD-10-CM | POA: Diagnosis not present

## 2016-09-04 DIAGNOSIS — Z1389 Encounter for screening for other disorder: Secondary | ICD-10-CM | POA: Diagnosis not present

## 2016-09-04 DIAGNOSIS — F3110 Bipolar disorder, current episode manic without psychotic features, unspecified: Secondary | ICD-10-CM | POA: Diagnosis not present

## 2016-09-04 DIAGNOSIS — Z0000 Encounter for general adult medical examination without abnormal findings: Secondary | ICD-10-CM | POA: Diagnosis not present

## 2016-09-04 DIAGNOSIS — R269 Unspecified abnormalities of gait and mobility: Secondary | ICD-10-CM | POA: Insufficient documentation

## 2016-09-04 DIAGNOSIS — I10 Essential (primary) hypertension: Secondary | ICD-10-CM | POA: Diagnosis not present

## 2016-09-04 DIAGNOSIS — Z6828 Body mass index (BMI) 28.0-28.9, adult: Secondary | ICD-10-CM | POA: Diagnosis not present

## 2016-09-04 DIAGNOSIS — J45909 Unspecified asthma, uncomplicated: Secondary | ICD-10-CM | POA: Diagnosis not present

## 2016-09-04 DIAGNOSIS — E538 Deficiency of other specified B group vitamins: Secondary | ICD-10-CM | POA: Diagnosis not present

## 2016-09-04 DIAGNOSIS — E784 Other hyperlipidemia: Secondary | ICD-10-CM | POA: Diagnosis not present

## 2016-09-04 DIAGNOSIS — R2689 Other abnormalities of gait and mobility: Secondary | ICD-10-CM | POA: Diagnosis not present

## 2016-09-04 DIAGNOSIS — G3109 Other frontotemporal dementia: Secondary | ICD-10-CM | POA: Diagnosis not present

## 2016-09-05 ENCOUNTER — Other Ambulatory Visit: Payer: Self-pay | Admitting: *Deleted

## 2016-09-05 NOTE — Patient Outreach (Signed)
[facility]) Care Management  [facility] Social Work  09/05/2016  [name] Oct 25, 1940 [id number]  Subjective:  "Patient has had 2 minor falls"  Objective: CSW has coordinated appointment with Psychiatry for assessment/treatment, etc.  Current Medications:  Current Outpatient Prescriptions  Medication Sig Dispense Refill  . albuterol (PROAIR HFA) 108 (90 BASE) MCG/ACT inhaler Inhale 2 puffs into the lungs every 4 (four) hours as needed for wheezing or shortness of breath.     Marland Kitchen amLODipine (NORVASC) 10 MG tablet Take 10 mg by mouth daily.    . ARIPiprazole (ABILIFY) 10 MG tablet Take 10 mg by mouth daily.    Marland Kitchen atorvastatin (LIPITOR) 10 MG tablet Take 10 mg by mouth every other day.     . bismuth subsalicylate (PEPTO BISMOL) 262 MG/15ML suspension Take 30 mLs by mouth 2 (two) times daily as needed for indigestion.     . Calcium Carbonate Antacid (TUMS PO) Take 4 tablets by mouth 4 (four) times daily as needed (heartburn). Reported on 10/06/2015    . clopidogrel (PLAVIX) 75 MG tablet Take 75 mg by mouth daily.    . cyanocobalamin (,VITAMIN B-12,) 1000 MCG/ML injection Inject 1,000 mcg into the muscle every 30 (thirty) days.    . divalproex (DEPAKOTE ER) 500 MG 24 hr tablet Take 500 mg by mouth 2 (two) times daily.    . Fluticasone-Salmeterol (ADVAIR) 250-50 MCG/DOSE AEPB Inhale 1 puff into the lungs daily.    . folic acid (FOLVITE) 1 MG tablet Take 1 mg by mouth daily.    Marland Kitchen MELATONIN PO Take 5 mg by mouth. 2 tablets at bedtime    . memantine (NAMENDA) 10 MG tablet Take 10 mg by mouth 2 (two) times daily. Reported on 10/06/2015    . metFORMIN (GLUCOPHAGE) 500 MG tablet Take 500 mg by mouth 2 (two) times daily with a meal.    . Multiple Vitamin (MULTIVITAMIN PO) Take 1 tablet by mouth daily.     Marland Kitchen omeprazole (PRILOSEC) 40 MG capsule Take 40 mg by mouth daily.     Marland Kitchen oxycodone (OXY-IR) 5 MG capsule Take 5 mg by mouth every 4 (four) hours as needed.    Marland Kitchen QUEtiapine (SEROQUEL)  50 MG tablet Take 100 mg by mouth at bedtime. Reported on 10/06/2015    . tamsulosin (FLOMAX) 0.4 MG CAPS capsule Take 0.4 mg by mouth daily.    . temazepam (RESTORIL) 15 MG capsule Take 15 mg by mouth at bedtime. Reported on 10/06/2015    . traMADol (ULTRAM) 50 MG tablet Take 50 mg by mouth every 6 (six) hours as needed.    . traZODone (DESYREL) 50 MG tablet Take 100 mg by mouth at bedtime. Reported on 10/06/2015    . valsartan (DIOVAN) 80 MG tablet Take 80 mg by mouth daily.     No current facility-administered medications for this visit.     Functional Status:  In your present state of health, do you have any difficulty performing the following activities: 07/13/2016  Hearing? Y  Vision? N  Difficulty concentrating or making decisions? Y  Walking or climbing stairs? Y  Dressing or bathing? Y  Doing errands, shopping? Y  Preparing Food and eating ? Y  Using the Toilet? Y  In the past six months, have you accidently leaked urine? Y  Do you have problems with loss of bowel control? Y  Managing your Medications? Y  Managing your Finances? N  Some recent data might be hidden    Fall/Depression  Screening:  PHQ 2/9 Scores 07/13/2016  PHQ - 2 Score 6  PHQ- 9 Score 27    Assessment: CSW spoke with [name] who reports patient has fallen twice; "I placed a pillow under his head and he was able to get himself up the next morning".  She denies any injury and feels his "sundowning" has worsened as he wanders a lot at night.  CSW reminded [name] of Psychiatry appointment for this week- she plans to assist in getting him to the appointment. She is still wanting to care for him in the home.   Plan: CSW will plan f/u call in 1 week for updates.   Eduard Clos, MSW, [place] Worker  [place] [phone]

## 2016-09-07 ENCOUNTER — Encounter (HOSPITAL_COMMUNITY): Payer: Self-pay | Admitting: Psychiatry

## 2016-09-07 ENCOUNTER — Other Ambulatory Visit (HOSPITAL_COMMUNITY): Payer: Self-pay

## 2016-09-07 ENCOUNTER — Ambulatory Visit (INDEPENDENT_AMBULATORY_CARE_PROVIDER_SITE_OTHER): Payer: Medicare Other | Admitting: Psychiatry

## 2016-09-07 VITALS — BP 132/78 | HR 66 | Ht 68.25 in | Wt 188.0 lb

## 2016-09-07 DIAGNOSIS — F329 Major depressive disorder, single episode, unspecified: Secondary | ICD-10-CM | POA: Diagnosis not present

## 2016-09-07 DIAGNOSIS — Z9049 Acquired absence of other specified parts of digestive tract: Secondary | ICD-10-CM

## 2016-09-07 DIAGNOSIS — Z9889 Other specified postprocedural states: Secondary | ICD-10-CM | POA: Diagnosis not present

## 2016-09-07 DIAGNOSIS — Z888 Allergy status to other drugs, medicaments and biological substances status: Secondary | ICD-10-CM

## 2016-09-07 DIAGNOSIS — Z810 Family history of intellectual disabilities: Secondary | ICD-10-CM

## 2016-09-07 DIAGNOSIS — Z8249 Family history of ischemic heart disease and other diseases of the circulatory system: Secondary | ICD-10-CM

## 2016-09-07 DIAGNOSIS — Z801 Family history of malignant neoplasm of trachea, bronchus and lung: Secondary | ICD-10-CM | POA: Diagnosis not present

## 2016-09-07 DIAGNOSIS — Z91013 Allergy to seafood: Secondary | ICD-10-CM

## 2016-09-07 DIAGNOSIS — Z79899 Other long term (current) drug therapy: Secondary | ICD-10-CM

## 2016-09-07 DIAGNOSIS — Z800 Family history of malignant neoplasm of digestive organs: Secondary | ICD-10-CM

## 2016-09-07 MED ORDER — ARIPIPRAZOLE 5 MG PO TABS: 10.0000 mg | ORAL_TABLET | Freq: Every day | ORAL | 5 refills | Status: DC

## 2016-09-07 NOTE — Progress Notes (Signed)
Psychiatric Initial Adult Assessment   Patient Identification: [name] MRN:  [id number] Date of Evaluation:  09/07/2016 Referral Source: Chief Complaint:   Visit Diagnosis: No diagnosis found.  History of Present Illness: This patient is a 76 year old [name] divorced male is being evaluated here for depression. He has a long history of clinical depression. At this time he needs a new provider and a better attempt to deal with his depression. Based upon his medications and his history is been diagnosed with a dementing disorder. According to his fiance lives with him who was seen with the patient, rather she claims he sundown's. This is basically becomes confused late in the day and evening. She never responds and never psychotic during this time but seems to be out of it and seems not to be logical. The patient is divorced for the last 6 years. He's been with [name] for the 6 years. The patient has a daughter who is estranged. The patient is retired Teaching laboratory technician. The patient had 2 psychiatric hospitalizations in the last year and a half. He had a psychiatric hospitalization in [place] and then about a month later had significant spondylosis surgery for spinal stenosis. Since then has had great difficulty walking. This affects his ability to go fishing and golfing. According to [name] the patient is preoccupied with indwelling the past. He feels dismissed many opportunities  is also much of his money and divorce. The patient describes persistent daily depression for the last one year. His sleep is somewhat erratic and he does take naps. This sleep pattern is been like this for years. His appetite is generally actually good even though he did lose about 40 pounds over the last year. His problems concentrating has a reduction in energy and feels worthless. He denies being suicidal at this time. His fleeting thoughts of couple times a week no true intense and has never made a  suicide attempt. He still is able to enjoy watching television enjoys pets he has at home.  He denies the use of alcohol or drugs. He's never been psychotic overtly other than some nighttime visual hallucinations occur erratically. The patient is never had an episode of mania by his report for his wife's/fianc. The patient denies symptoms of generalized anxiety disorder panic disorder or obsessive-compulsive disorder. The patient had 2 psychiatric hospitalizations is been on multiple antidepressants. He's been on Prozac Pristiq with little benefit. His recent psychiatric hospitalization year ago at [facility] was begun on Depakote for what appeared to be extreme irritability. He was also begun on trazodone and Seroquel. It should be noted that he takes Aricept and Namenda for a dementing process. He also takes Restoril 15 mg. The patient her falls great deal. This is very concerning. She is going for his therapy regarding back surgery. I suspect there is very little to do about this condition.  Associated Signs/Symptoms: Depression Symptoms:  depressed mood, (Hypo) Manic Symptoms:   Anxiety Symptoms:   Psychotic Symptoms:   PTSD Symptoms:   Past Psychiatric History: Multiple antidepressants 2 psychiatric hospitalizations  Previous Psychotropic Medications: Yes   Substance Abuse History in the last 12 months:  No.  Consequences of Substance Abuse:   Past Medical History:  Past Medical History:  Diagnosis Date  . Anxiety    Xanax as needed  . Arthritis    spinal stenosis.  . Asthma   . Bipolar disorder ([place])   . Cataract   . Chronic kidney disease    since NSAID's stopped -function has  improved  . COPD (chronic obstructive pulmonary disease) ([place])   . Crohn's colitis ([place])   . Depression    takes Cymbalta daily  . Diabetes mellitus, type II ([place])   . Diverticulosis 01/2007  . DM (diabetes mellitus) ([place])    takes Januvia now, metformin stopped due to diarrhea  . ED  (erectile dysfunction)   . GERD (gastroesophageal reflux disease)    uses Tums  . Glaucoma    surgery to correct  . History of colon polyps   . HLD (hyperlipidemia)    takes Lipitor daily  . HTN (hypertension)    takes Altace and Diovan daily  . Impaired hearing    "getting this checked out on Monday"  . Insomnia   . Internal hemorrhoids 01/2007  . Joint pain   . Joint swelling   . Microscopic colitis   . Nocturia   . Obesity   . Pneumonia 3/09  . Sleep apnea    uses CPAP;has been greater than 49yrs since sleep study- [name]    Past Surgical History:  Procedure Laterality Date  . CATARACT EXTRACTION W/ INTRAOCULAR LENS  IMPLANT, BILATERAL  2011  . CHOLECYSTECTOMY  2011  . COLONOSCOPY W/ BIOPSIES  2008   Looks normal, biopsy suggested a chronic microscopic colitis with rare granulomas  . COLONOSCOPY W/ BIOPSIES  02/2011   diverticulosis, internal hemorrhoids, patchy chronic colitis on random bxs,   . EYE SURGERY     for glaucoma  . HERNIA REPAIR    . LUMBAR LAMINECTOMY/DECOMPRESSION MICRODISCECTOMY N/A 08/04/2015   Procedure: CENTRAL DECOMPRESSION  LUMBAR LAMINECTOMY L4-L5 ;  Surgeon: Benjamin Maudlin, MD;  Location: [facility];  Service: Orthopedics;  Laterality: N/A;  . NOSE SURGERY  2011  . PENILE PROSTHESIS IMPLANT  2012  . ROTATOR CUFF REPAIR Bilateral    '12 -left, '13 -right  . SHOULDER ARTHROSCOPY  08/24/2011   Procedure: ARTHROSCOPY SHOULDER;  Surgeon: [name];  Location: [facility];  Service: Orthopedics;  Laterality: Right;  RIGHT SHOULDER ARTHROSCOPY WITH SUBACROMINAL DECOMPRESSION AND DISTAL CLAVICAL RESECTION   . sleep apnea surgery  1997  . TONSILLECTOMY     as a child  . UMBILICAL HERNIA REPAIR  5+yrs ago    Family Psychiatric History:   Family History:  Family History  Problem Relation Age of Onset  . Esophageal cancer Paternal Grandmother   . Heart failure Mother   . Depression Mother   . Alzheimer's disease Brother   . Colon cancer Neg Hx      Social History:   Social History   Social History  . Marital status: Widowed    Spouse name: N/A  . Number of children: N/A  . Years of education: N/A   Social History Main Topics  . Smoking status: Never Smoker  . Smokeless tobacco: Never Used  . Alcohol use No     Comment: very rare  . Drug use: No  . Sexual activity: Not Currently   Other Topics Concern  . 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 Shellfish Allergy Anaphylaxis    Patient informed RN no problem with Betadine 08/04/2015   . Nsaids Other (See Comments)    Due to kidney function    Metabolic Disorder Labs: Lab Results  Component Value Date   HGBA1C 9.9 (H) 08/02/2015  MPG 237 08/02/2015   No results found for: PROLACTIN No results found for: CHOL, TRIG, HDL, CHOLHDL, VLDL, LDLCALC   Current Medications: Current Outpatient Prescriptions  Medication Sig Dispense Refill  . acetaminophen (TYLENOL) 325 MG tablet Take 650 mg by mouth daily.    Marland Kitchen albuterol (PROAIR HFA) 108 (90 BASE) MCG/ACT inhaler Inhale 2 puffs into the lungs every 4 (four) hours as needed for wheezing or shortness of breath.     Marland Kitchen amLODipine (NORVASC) 10 MG tablet Take 10 mg by mouth daily.    . ARIPiprazole (ABILIFY) 5 MG tablet Take 2 tablets (10 mg total) by mouth daily. 30 tablet 5  . atorvastatin (LIPITOR) 10 MG tablet Take 10 mg by mouth every other day.     . bismuth subsalicylate (PEPTO BISMOL) 262 MG/15ML suspension Take 30 mLs by mouth 2 (two) times daily as needed for indigestion.     . Calcium Carbonate Antacid (TUMS PO) Take 4 tablets by mouth 4 (four) times daily as needed (heartburn). Reported on 10/06/2015    . clopidogrel (PLAVIX) 75 MG tablet Take 75 mg by mouth daily.    . cyanocobalamin (,VITAMIN B-12,) 1000 MCG/ML injection Inject 1,000  mcg into the muscle every 30 (thirty) days.    . divalproex (DEPAKOTE ER) 500 MG 24 hr tablet Take 500 mg by mouth 2 (two) times daily.    . Fluticasone-Salmeterol (ADVAIR) 250-50 MCG/DOSE AEPB Inhale 1 puff into the lungs daily.    . folic acid (FOLVITE) 1 MG tablet Take 1 mg by mouth daily.    . Melatonin 500 MCG TBDP Take 5 mg by mouth at bedtime.    . memantine (NAMENDA) 10 MG tablet Take 10 mg by mouth 2 (two) times daily. Reported on 10/06/2015    . metFORMIN (GLUCOPHAGE) 500 MG tablet Take 500 mg by mouth 2 (two) times daily with a meal.    . Multiple Vitamin (MULTIVITAMIN PO) Take 1 tablet by mouth daily.     Marland Kitchen omeprazole (PRILOSEC) 40 MG capsule Take 40 mg by mouth daily.     Marland Kitchen oxycodone (OXY-IR) 5 MG capsule Take 5 mg by mouth every 4 (four) hours as needed.    Marland Kitchen QUEtiapine (SEROQUEL) 50 MG tablet Take 100 mg by mouth at bedtime. Reported on 10/06/2015    . tamsulosin (FLOMAX) 0.4 MG CAPS capsule Take 0.4 mg by mouth daily.    . temazepam (RESTORIL) 15 MG capsule Take 15 mg by mouth at bedtime. Reported on 10/06/2015    . traMADol (ULTRAM) 50 MG tablet Take 50 mg by mouth every 6 (six) hours as needed.    . traZODone (DESYREL) 50 MG tablet Take 100 mg by mouth at bedtime. Reported on 10/06/2015    . valsartan (DIOVAN) 80 MG tablet Take 80 mg by mouth daily.    Marland Kitchen MELATONIN PO Take 5 mg by mouth. 2 tablets at bedtime     No current facility-administered medications for this visit.     Neurologic: Headache: No Seizure: Yes Paresthesias:No  Musculoskeletal: Strength & Muscle Tone: abnormal Gait & Station: unsteady Patient leans: N/A  Psychiatric Specialty Exam: ROS  Blood pressure 132/78, pulse 66, height 5' 8.25" (1.734 m), weight 188 lb (85.3 kg), SpO2 99 %.Body mass index is 28.38 kg/m.  General Appearance: Casual  Eye Contact:  Good  Speech:  Clear and Coherent  Volume:  Normal  Mood:  Depressed  Affect:  Appropriate  Thought Process:  Goal Directed  Orientation:  NA  Thought Content:  Logical  Suicidal Thoughts:  No  Homicidal Thoughts:  No  Memory:  Negative  Judgement:  Impaired  Insight:  Lacking  Psychomotor Activity:  Normal  Concentration:    Recall:  [place] of Knowledge:Fair  Language: Good  Akathisia:  No  Handed:  Right  AIMS (if indicated):    Assets:  Desire for Improvement  ADL's:  Impaired  Cognition: Impaired,  Moderate  Sleep:      Treatment Plan Summary: At this time this patient has a diagnosis of dementia. I do not think it is severe dementia. He is completely oriented use language therapy but his family. He make his needs known. He does have a problem of sundowning. At this time is on multiple psychotropic medications are reducing the possibility of polypharmacy. He is on redundant medicines as well. For now worse we'll continue his Aricept and his. The possibility of discontinuing the man is not out of the question but it don't think it's necessary I think was not necessary as to take 50 mg a Seroquel as he really doesn't have any psychosis. We'll discontinue his trazodone 50 mg. Continue Depakote because of the started in the hospital manometry truly needs that as well. He says that he is 1 200 mg of Zoloft although it's not well-documented at all. They're confirmed that he is on this because a few think he needs to be on an antidepressant. He says he can tolerate Zoloft but it don't think he is significantly improved on it. There is confusion about the dose of Abilify. According to this information and brought the only takes 2 mg of Abilify at this time we'll go ahead and increase it to 5 mg in the morning. So will discontinue Seroquel and trazodone increase his Abilify continue all his other medications are return to see me in about 7 weeks. I do not think this patient is suicidal. He is lots of assistance or so her physical limitations.   Haskel Schroeder, MD 1/25/20183:58 PM

## 2016-09-12 ENCOUNTER — Other Ambulatory Visit: Payer: Self-pay | Admitting: *Deleted

## 2016-09-12 NOTE — Patient Outreach (Signed)
[facility]) Care Management  [facility] Social Work  09/12/2016  BING DUFFEY [birth year] [id number]  Subjective:  "He is with the sitter today" per [name].   Encounter Medications:  Outpatient Encounter Prescriptions as of 09/12/2016  Medication Sig Note  . acetaminophen (TYLENOL) 325 MG tablet Take 650 mg by mouth daily.   Marland Kitchen albuterol (PROAIR HFA) 108 (90 BASE) MCG/ACT inhaler Inhale 2 puffs into the lungs every 4 (four) hours as needed for wheezing or shortness of breath.    Marland Kitchen amLODipine (NORVASC) 10 MG tablet Take 10 mg by mouth daily.   . ARIPiprazole (ABILIFY) 5 MG tablet Take 2 tablets (10 mg total) by mouth daily.   Marland Kitchen atorvastatin (LIPITOR) 10 MG tablet Take 10 mg by mouth every other day.    . bismuth subsalicylate (PEPTO BISMOL) 262 MG/15ML suspension Take 30 mLs by mouth 2 (two) times daily as needed for indigestion.    . Calcium Carbonate Antacid (TUMS PO) Take 4 tablets by mouth 4 (four) times daily as needed (heartburn). Reported on 10/06/2015   . clopidogrel (PLAVIX) 75 MG tablet Take 75 mg by mouth daily.   . cyanocobalamin (,VITAMIN B-12,) 1000 MCG/ML injection Inject 1,000 mcg into the muscle every 30 (thirty) days.   . divalproex (DEPAKOTE ER) 500 MG 24 hr tablet Take 500 mg by mouth 2 (two) times daily.   . Fluticasone-Salmeterol (ADVAIR) 250-50 MCG/DOSE AEPB Inhale 1 puff into the lungs daily.   . folic acid (FOLVITE) 1 MG tablet Take 1 mg by mouth daily.   . Melatonin 500 MCG TBDP Take 5 mg by mouth at bedtime.   Marland Kitchen MELATONIN PO Take 5 mg by mouth. 2 tablets at bedtime   . memantine (NAMENDA) 10 MG tablet Take 10 mg by mouth 2 (two) times daily. Reported on 10/06/2015   . metFORMIN (GLUCOPHAGE) 500 MG tablet Take 500 mg by mouth 2 (two) times daily with a meal.   . Multiple Vitamin (MULTIVITAMIN PO) Take 1 tablet by mouth daily.    Marland Kitchen omeprazole (PRILOSEC) 40 MG capsule Take 40 mg by mouth daily.  09/07/2016: Takes only as needed  . oxycodone (OXY-IR) 5  MG capsule Take 5 mg by mouth every 4 (four) hours as needed. 09/07/2016: Every 4-6 hours as needed.   Marland Kitchen QUEtiapine (SEROQUEL) 50 MG tablet Take 100 mg by mouth at bedtime. Reported on 10/06/2015 09/07/2016: Takes 50 mg only at bedtime.  . tamsulosin (FLOMAX) 0.4 MG CAPS capsule Take 0.4 mg by mouth daily.   . temazepam (RESTORIL) 15 MG capsule Take 15 mg by mouth at bedtime. Reported on 10/06/2015   . traMADol (ULTRAM) 50 MG tablet Take 50 mg by mouth every 6 (six) hours as needed.   . traZODone (DESYREL) 50 MG tablet Take 100 mg by mouth at bedtime. Reported on 10/06/2015 09/07/2016: Takes only 50 mg at bedtime.   . valsartan (DIOVAN) 80 MG tablet Take 80 mg by mouth daily.    No facility-administered encounter medications on file as of 09/12/2016.     Functional Status:  In your present state of health, do you have any difficulty performing the following activities: 07/13/2016  Hearing? Y  Vision? N  Difficulty concentrating or making decisions? Y  Walking or climbing stairs? Y  Dressing or bathing? Y  Doing errands, shopping? Y  Preparing Food and eating ? Y  Using the Toilet? Y  In the past six months, have you accidently leaked urine? Y  Do you have problems  with loss of bowel control? Y  Managing your Medications? Y  Managing your Finances? N  Some recent data might be hidden    Fall/Depression Screening:  PHQ 2/9 Scores 07/13/2016  PHQ - 2 Score 6  PHQ- 9 Score 27    Assessment:  Caregiver reports "he is good during the day; at night, he's a different person". She reports she is trying to "be wiser" and is appreciative to our care/assistance. " Plan: CSW will plan f/u call in 2-3 weeks.     Eduard Clos, MSW, [place] Worker  [place] [phone]

## 2016-09-14 DIAGNOSIS — Z9889 Other specified postprocedural states: Secondary | ICD-10-CM | POA: Diagnosis not present

## 2016-09-14 DIAGNOSIS — M48061 Spinal stenosis, lumbar region without neurogenic claudication: Secondary | ICD-10-CM | POA: Diagnosis not present

## 2016-09-14 DIAGNOSIS — Z981 Arthrodesis status: Secondary | ICD-10-CM | POA: Diagnosis not present

## 2016-09-14 DIAGNOSIS — M25552 Pain in left hip: Secondary | ICD-10-CM | POA: Diagnosis not present

## 2016-09-14 DIAGNOSIS — G8929 Other chronic pain: Secondary | ICD-10-CM | POA: Diagnosis not present

## 2016-09-15 ENCOUNTER — Telehealth (HOSPITAL_COMMUNITY): Payer: Self-pay | Admitting: Psychiatry

## 2016-09-15 ENCOUNTER — Other Ambulatory Visit (HOSPITAL_COMMUNITY): Payer: Self-pay | Admitting: Psychiatry

## 2016-09-15 MED ORDER — QUETIAPINE FUMARATE 100 MG PO TABS: ORAL_TABLET | ORAL | 5 refills | Status: DC

## 2016-09-15 NOTE — Telephone Encounter (Signed)
Today I spoke to his fiance Lorreta. She shared with the patient went to the refrigerator 2 nights ago and 8 some dog food. He then called 911. At some point his fiance intervened and the police did not take them anywhere. The patient actually hadn't changed at all. This is chronic, that he would get confused in the middle the night. His fiance would like them to reevaluate his medicines but at this time she is in agreement with returning back his Seroquel. Will try backing go to 100 mg for 3 nights and then if necessary increase it to 200 mg. I think this is necessary as adjunct treatment with his antidepressants. At this time our ovoid higher dose or any trazodone as this is a bigger risk for falls in this gentleman who is at high risk of falling. The patient agreed with these plans as directed [facility]. They will keep her next scheduled appointment with me we'll go ahead and call in his medicine at the higher dose of Seroquel

## 2016-09-18 DIAGNOSIS — R269 Unspecified abnormalities of gait and mobility: Secondary | ICD-10-CM | POA: Diagnosis not present

## 2016-09-18 DIAGNOSIS — G2111 Neuroleptic induced parkinsonism: Secondary | ICD-10-CM | POA: Diagnosis not present

## 2016-09-18 DIAGNOSIS — G3184 Mild cognitive impairment, so stated: Secondary | ICD-10-CM | POA: Diagnosis not present

## 2016-09-18 DIAGNOSIS — F0281 Dementia in other diseases classified elsewhere with behavioral disturbance: Secondary | ICD-10-CM | POA: Diagnosis not present

## 2016-09-18 DIAGNOSIS — G3109 Other frontotemporal dementia: Secondary | ICD-10-CM | POA: Diagnosis not present

## 2016-09-18 DIAGNOSIS — R251 Tremor, unspecified: Secondary | ICD-10-CM | POA: Diagnosis not present

## 2016-09-18 DIAGNOSIS — G609 Hereditary and idiopathic neuropathy, unspecified: Secondary | ICD-10-CM | POA: Diagnosis not present

## 2016-09-18 DIAGNOSIS — Z8673 Personal history of transient ischemic attack (TIA), and cerebral infarction without residual deficits: Secondary | ICD-10-CM | POA: Diagnosis not present

## 2016-09-19 ENCOUNTER — Other Ambulatory Visit (HOSPITAL_COMMUNITY): Payer: Self-pay

## 2016-09-19 MED ORDER — ARIPIPRAZOLE 10 MG PO TBDP: 10.0000 mg | ORAL_TABLET | Freq: Every day | ORAL | 5 refills | Status: DC

## 2016-09-20 DIAGNOSIS — H31092 Other chorioretinal scars, left eye: Secondary | ICD-10-CM | POA: Diagnosis not present

## 2016-09-20 DIAGNOSIS — H35362 Drusen (degenerative) of macula, left eye: Secondary | ICD-10-CM | POA: Diagnosis not present

## 2016-09-20 DIAGNOSIS — Z961 Presence of intraocular lens: Secondary | ICD-10-CM | POA: Diagnosis not present

## 2016-09-20 DIAGNOSIS — H401123 Primary open-angle glaucoma, left eye, severe stage: Secondary | ICD-10-CM | POA: Diagnosis not present

## 2016-10-02 ENCOUNTER — Ambulatory Visit: Payer: Self-pay | Admitting: *Deleted

## 2016-10-12 DIAGNOSIS — G8929 Other chronic pain: Secondary | ICD-10-CM | POA: Diagnosis not present

## 2016-10-12 DIAGNOSIS — Z981 Arthrodesis status: Secondary | ICD-10-CM | POA: Diagnosis not present

## 2016-10-12 DIAGNOSIS — M48061 Spinal stenosis, lumbar region without neurogenic claudication: Secondary | ICD-10-CM | POA: Diagnosis not present

## 2016-10-12 DIAGNOSIS — M5416 Radiculopathy, lumbar region: Secondary | ICD-10-CM | POA: Diagnosis not present

## 2016-10-12 DIAGNOSIS — F0390 Unspecified dementia without behavioral disturbance: Secondary | ICD-10-CM | POA: Diagnosis not present

## 2016-10-12 DIAGNOSIS — M25552 Pain in left hip: Secondary | ICD-10-CM | POA: Diagnosis not present

## 2016-10-12 DIAGNOSIS — Z7902 Long term (current) use of antithrombotics/antiplatelets: Secondary | ICD-10-CM | POA: Diagnosis not present

## 2016-10-19 DIAGNOSIS — M5416 Radiculopathy, lumbar region: Secondary | ICD-10-CM | POA: Insufficient documentation

## 2016-10-25 DIAGNOSIS — M5416 Radiculopathy, lumbar region: Secondary | ICD-10-CM | POA: Diagnosis not present

## 2016-10-25 DIAGNOSIS — M1612 Unilateral primary osteoarthritis, left hip: Secondary | ICD-10-CM | POA: Diagnosis not present

## 2016-11-09 ENCOUNTER — Other Ambulatory Visit: Payer: Self-pay | Admitting: *Deleted

## 2016-11-10 NOTE — Patient Outreach (Signed)
[facility]) Care Management  [facility] Social Work  11/10/2016  FUMIO VANDAM [birth year] [id number]  Subjective:  "Not much has changed".  Objective:  CSW to assist patient with commuity based resources to aide in her well-being, quality of life and overall safety/needs.    Current Medications:  Current Outpatient Prescriptions  Medication Sig Dispense Refill  . acetaminophen (TYLENOL) 325 MG tablet Take 650 mg by mouth daily.    Marland Kitchen albuterol (PROAIR HFA) 108 (90 BASE) MCG/ACT inhaler Inhale 2 puffs into the lungs every 4 (four) hours as needed for wheezing or shortness of breath.     Marland Kitchen amLODipine (NORVASC) 10 MG tablet Take 10 mg by mouth daily.    Marland Kitchen aripiprazole (ABILIFY) 10 MG disintegrating tablet Take 1 tablet (10 mg total) by mouth daily. 30 tablet 5  . atorvastatin (LIPITOR) 10 MG tablet Take 10 mg by mouth every other day.     . bismuth subsalicylate (PEPTO BISMOL) 262 MG/15ML suspension Take 30 mLs by mouth 2 (two) times daily as needed for indigestion.     . Calcium Carbonate Antacid (TUMS PO) Take 4 tablets by mouth 4 (four) times daily as needed (heartburn). Reported on 10/06/2015    . clopidogrel (PLAVIX) 75 MG tablet Take 75 mg by mouth daily.    . cyanocobalamin (,VITAMIN B-12,) 1000 MCG/ML injection Inject 1,000 mcg into the muscle every 30 (thirty) days.    . divalproex (DEPAKOTE ER) 500 MG 24 hr tablet Take 500 mg by mouth 2 (two) times daily.    . Fluticasone-Salmeterol (ADVAIR) 250-50 MCG/DOSE AEPB Inhale 1 puff into the lungs daily.    . folic acid (FOLVITE) 1 MG tablet Take 1 mg by mouth daily.    . Melatonin 500 MCG TBDP Take 5 mg by mouth at bedtime.    Marland Kitchen MELATONIN PO Take 5 mg by mouth. 2 tablets at bedtime    . memantine (NAMENDA) 10 MG tablet Take 10 mg by mouth 2 (two) times daily. Reported on 10/06/2015    . metFORMIN (GLUCOPHAGE) 500 MG tablet Take 500 mg by mouth 2 (two) times daily with a meal.    . Multiple Vitamin (MULTIVITAMIN PO) Take 1  tablet by mouth daily.     Marland Kitchen omeprazole (PRILOSEC) 40 MG capsule Take 40 mg by mouth daily.     Marland Kitchen oxycodone (OXY-IR) 5 MG capsule Take 5 mg by mouth every 4 (four) hours as needed.    Marland Kitchen QUEtiapine (SEROQUEL) 100 MG tablet 1 qhs after 2 nights may increase to 2  qhs 60 tablet 5  . tamsulosin (FLOMAX) 0.4 MG CAPS capsule Take 0.4 mg by mouth daily.    . temazepam (RESTORIL) 15 MG capsule Take 15 mg by mouth at bedtime. Reported on 10/06/2015    . traMADol (ULTRAM) 50 MG tablet Take 50 mg by mouth every 6 (six) hours as needed.    . traZODone (DESYREL) 50 MG tablet Take 100 mg by mouth at bedtime. Reported on 10/06/2015    . valsartan (DIOVAN) 80 MG tablet Take 80 mg by mouth daily.     No current facility-administered medications for this visit.     Functional Status:  In your present state of health, do you have any difficulty performing the following activities: 07/13/2016  Hearing? Y  Vision? N  Difficulty concentrating or making decisions? Y  Walking or climbing stairs? Y  Dressing or bathing? Y  Doing errands, shopping? Y  Preparing Food and eating ? [name]  Using the Toilet? Y  In the past six months, have you accidently leaked urine? Y  Do you have problems with loss of bowel control? Y  Managing your Medications? Y  Managing your Finances? N  Some recent data might be hidden    Fall/Depression Screening:  PHQ 2/9 Scores 07/13/2016  PHQ - 2 Score 6  PHQ- 9 Score 27    Assessment:  CSW spoke with [name], patient's caregiver, by phone- she reports that Mr Borowiak has not changed much in the way of improvement and she wants to consider a respite stay- CSW discussed possible ALF's she can call to inquire about this with and will also research options for her also.   Plan:  CSW plans f/u in 1-2 weeks with respite opportunities for them to consider.           Eduard Clos, MSW, [place] Worker  [place] [phone]

## 2016-11-15 ENCOUNTER — Ambulatory Visit (HOSPITAL_COMMUNITY): Payer: Self-pay | Admitting: Psychiatry

## 2016-11-20 DIAGNOSIS — R269 Unspecified abnormalities of gait and mobility: Secondary | ICD-10-CM | POA: Diagnosis not present

## 2016-11-20 DIAGNOSIS — G3109 Other frontotemporal dementia: Secondary | ICD-10-CM | POA: Diagnosis not present

## 2016-11-20 DIAGNOSIS — F0281 Dementia in other diseases classified elsewhere with behavioral disturbance: Secondary | ICD-10-CM | POA: Diagnosis not present

## 2016-11-20 DIAGNOSIS — R251 Tremor, unspecified: Secondary | ICD-10-CM | POA: Diagnosis not present

## 2016-11-20 DIAGNOSIS — G2111 Neuroleptic induced parkinsonism: Secondary | ICD-10-CM | POA: Diagnosis not present

## 2016-11-20 DIAGNOSIS — G609 Hereditary and idiopathic neuropathy, unspecified: Secondary | ICD-10-CM | POA: Diagnosis not present

## 2016-11-20 DIAGNOSIS — Z8673 Personal history of transient ischemic attack (TIA), and cerebral infarction without residual deficits: Secondary | ICD-10-CM | POA: Diagnosis not present

## 2016-11-23 ENCOUNTER — Telehealth (HOSPITAL_COMMUNITY): Payer: Self-pay

## 2016-11-23 NOTE — Telephone Encounter (Signed)
Medication management - telephone call with pt's fiancee after she left a message pt is "rooming" more at night and falling at times.  States Neurologist reports sundowning occuring. Questioned current medication dosages and agreed to call Dr.Plovsky to discuss as patient only taking Seroquel 150 mg nightly and Trazodone 100 mg confirmed.  Patient never increased to 200 mg Seroquel.  Called and spoke with Dr. Casimiro Needle to inform of patient's status and Dr. Casimiro Needle wanted collateral to increase patient's Seroquel to 100 mg, 2 at bedtime, keep Trazodone at 100 mg and to call back in the coming week to report how he does with change as Dr. Casimiro Needle did not want to change too much at once.  Called Clayborn Bigness, patient fiancee back and provided instruction.  Collateral agreed with plan for patient to increase Seroquel to 100 mg, 2 at bedtime and will call back in the coming week to inform how patient does with change.

## 2016-12-04 DIAGNOSIS — M1612 Unilateral primary osteoarthritis, left hip: Secondary | ICD-10-CM | POA: Diagnosis not present

## 2016-12-04 DIAGNOSIS — M25559 Pain in unspecified hip: Secondary | ICD-10-CM | POA: Diagnosis not present

## 2016-12-06 ENCOUNTER — Telehealth (HOSPITAL_COMMUNITY): Payer: Self-pay

## 2016-12-06 NOTE — Telephone Encounter (Signed)
Patients wife called, she was unable to pick up her husbands Seroquel and was not told why. I called the pharmacy and they were able to find the order and will get it ready for the patient. I called patients wife back and let her know.

## 2016-12-07 ENCOUNTER — Other Ambulatory Visit: Payer: Self-pay | Admitting: *Deleted

## 2016-12-07 NOTE — Patient Outreach (Signed)
[facility]) Care Management  12/07/2016  GARI HARTSELL 01-Sep-1940 [id number]   Mailed list of cna/sitter agencies per Eduard Clos, LCSW

## 2016-12-07 NOTE — Patient Outreach (Signed)
[facility]) Care Management  12/07/2016  [name] 01-Nov-1940 [id number]   CSW spoke with patient's caregiver by phone. She is still considering and investigating possible ALF respite stay for him; stating, "he is a lot of work".  CSW provided additional resources and will mail a sitter agency list to her.  CSW has encouraged her to also consider utilizing his long term care plan policy for respite option.  CSW will plan f/u call in 2-3 weeks with possible case  closure.        Eduard Clos, MSW, [place] Worker  [place] [phone]

## 2016-12-18 DIAGNOSIS — F29 Unspecified psychosis not due to a substance or known physiological condition: Secondary | ICD-10-CM | POA: Diagnosis not present

## 2016-12-18 DIAGNOSIS — R634 Abnormal weight loss: Secondary | ICD-10-CM | POA: Diagnosis not present

## 2016-12-18 DIAGNOSIS — R269 Unspecified abnormalities of gait and mobility: Secondary | ICD-10-CM | POA: Diagnosis not present

## 2016-12-18 DIAGNOSIS — G3109 Other frontotemporal dementia: Secondary | ICD-10-CM | POA: Diagnosis not present

## 2016-12-18 DIAGNOSIS — G20 Parkinson's disease: Secondary | ICD-10-CM | POA: Diagnosis not present

## 2016-12-18 DIAGNOSIS — R946 Abnormal results of thyroid function studies: Secondary | ICD-10-CM | POA: Diagnosis not present

## 2016-12-18 DIAGNOSIS — E1129 Type 2 diabetes mellitus with other diabetic kidney complication: Secondary | ICD-10-CM | POA: Diagnosis not present

## 2016-12-18 DIAGNOSIS — Z6825 Body mass index (BMI) 25.0-25.9, adult: Secondary | ICD-10-CM | POA: Diagnosis not present

## 2016-12-21 ENCOUNTER — Other Ambulatory Visit: Payer: Self-pay | Admitting: *Deleted

## 2016-12-21 NOTE — Patient Outreach (Signed)
[facility]) Care Management  [facility] Social Work  12/21/2016  [name] 02-Nov-1940 [id number]  Subjective:  Caregiver is still looking into respite care options.  Objective: CSW to assist patient with commuity based resources to aide in patient's well-being, quality of life and overall safety/needs.     Current Medications:  Current Outpatient Prescriptions  Medication Sig Dispense Refill  . acetaminophen (TYLENOL) 325 MG tablet Take 650 mg by mouth daily.    Marland Kitchen albuterol (PROAIR HFA) 108 (90 BASE) MCG/ACT inhaler Inhale 2 puffs into the lungs every 4 (four) hours as needed for wheezing or shortness of breath.     Marland Kitchen amLODipine (NORVASC) 10 MG tablet Take 10 mg by mouth daily.    Marland Kitchen aripiprazole (ABILIFY) 10 MG disintegrating tablet Take 1 tablet (10 mg total) by mouth daily. 30 tablet 5  . atorvastatin (LIPITOR) 10 MG tablet Take 10 mg by mouth every other day.     . bismuth subsalicylate (PEPTO BISMOL) 262 MG/15ML suspension Take 30 mLs by mouth 2 (two) times daily as needed for indigestion.     . Calcium Carbonate Antacid (TUMS PO) Take 4 tablets by mouth 4 (four) times daily as needed (heartburn). Reported on 10/06/2015    . clopidogrel (PLAVIX) 75 MG tablet Take 75 mg by mouth daily.    . cyanocobalamin (,VITAMIN B-12,) 1000 MCG/ML injection Inject 1,000 mcg into the muscle every 30 (thirty) days.    . divalproex (DEPAKOTE ER) 500 MG 24 hr tablet Take 500 mg by mouth 2 (two) times daily.    . Fluticasone-Salmeterol (ADVAIR) 250-50 MCG/DOSE AEPB Inhale 1 puff into the lungs daily.    . folic acid (FOLVITE) 1 MG tablet Take 1 mg by mouth daily.    . Melatonin 500 MCG TBDP Take 5 mg by mouth at bedtime.    Marland Kitchen MELATONIN PO Take 5 mg by mouth. 2 tablets at bedtime    . memantine (NAMENDA) 10 MG tablet Take 10 mg by mouth 2 (two) times daily. Reported on 10/06/2015    . metFORMIN (GLUCOPHAGE) 500 MG tablet Take 500 mg by mouth 2 (two) times daily with a meal.    .  Multiple Vitamin (MULTIVITAMIN PO) Take 1 tablet by mouth daily.     Marland Kitchen omeprazole (PRILOSEC) 40 MG capsule Take 40 mg by mouth daily.     Marland Kitchen oxycodone (OXY-IR) 5 MG capsule Take 5 mg by mouth every 4 (four) hours as needed.    Marland Kitchen QUEtiapine (SEROQUEL) 100 MG tablet 1 qhs after 2 nights may increase to 2  qhs 60 tablet 5  . tamsulosin (FLOMAX) 0.4 MG CAPS capsule Take 0.4 mg by mouth daily.    . temazepam (RESTORIL) 15 MG capsule Take 15 mg by mouth at bedtime. Reported on 10/06/2015    . traMADol (ULTRAM) 50 MG tablet Take 50 mg by mouth every 6 (six) hours as needed.    . traZODone (DESYREL) 50 MG tablet Take 100 mg by mouth at bedtime. Reported on 10/06/2015    . valsartan (DIOVAN) 80 MG tablet Take 80 mg by mouth daily.     No current facility-administered medications for this visit.     Functional Status:  In your present state of health, do you have any difficulty performing the following activities: 07/13/2016  Hearing? Y  Vision? N  Difficulty concentrating or making decisions? Y  Walking or climbing stairs? Y  Dressing or bathing? Y  Doing errands, shopping? Y  Quarry manager and  eating ? Y  Using the Toilet? Y  In the past six months, have you accidently leaked urine? Y  Do you have problems with loss of bowel control? Y  Managing your Medications? Y  Managing your Finances? N  Some recent data might be hidden    Fall/Depression Screening:  Fall Risk  07/13/2016 04/10/2016  Falls in the past year? Yes Yes  Number falls in past yr: 2 or more 1  Injury with Fall? Yes Yes  Risk Factor Category  High Fall Risk -  Risk for fall due to : History of fall(s) -  Follow up Falls prevention discussed -   PHQ 2/9 Scores 07/13/2016  PHQ - 2 Score 6  PHQ- 9 Score 27    Assessment:  CSW spoke with patient's caregiver by phone today. "he is about the same". She reports plans for him to start back to outpatient rehab to strengthen his legs and get him more independent. She is still  investigating cost and options for day care/day center and a respite stay at an ALF.  CSW encouraged her to seek legal advice for elder law needs including his monies, assets, etc that may need to be used for long term placement.  Caregiver reports she got a RX for a light weight wheelchair but is having trouble getting a DME provider to accept it- suggested that she try [facility] in [place] or another local DME office/store.  Plan: CSW will plan f/u call in the next 2-3 weeks for updates and further needs/goals.  THN CM Care Plan Problem One     Most Recent Value  Care Plan Problem One  (P) Patient reports having depression and anxiety.   Role Documenting the Problem One  (P) Clinical Social Worker  Care Plan for Problem One  (P) Active  THN CM Short Term Goal #1 (0-30 days)  (P) Patient will receive appointments for Psychiatry assessment and counseling in the next 30 days.  THN CM Short Term Goal #1 Start Date  (P) 07/13/16  THN CM Short Term Goal #1 Met Date  (P) 07/20/16  Interventions for Short Term Goal #1  (P) CSW providing patient with appointments for initial assessment and treatment with new providers.  THN CM Short Term Goal #2 (0-30 days)  (P) Patient will recieve Psychaitric evaluation and counseling in the next 30 days.  THN CM Short Term Goal #2 Start Date  (P) 09/05/16  Interventions for Short Term Goal #2  (P) CSW has encouraged patient to attend appointments for new assessment and recommnendations.     Eduard Clos, MSW, [place] Worker  [place] [phone]

## 2016-12-27 DIAGNOSIS — R2681 Unsteadiness on feet: Secondary | ICD-10-CM | POA: Diagnosis not present

## 2016-12-27 DIAGNOSIS — M6281 Muscle weakness (generalized): Secondary | ICD-10-CM | POA: Diagnosis not present

## 2017-01-02 DIAGNOSIS — M6281 Muscle weakness (generalized): Secondary | ICD-10-CM | POA: Diagnosis not present

## 2017-01-02 DIAGNOSIS — R2681 Unsteadiness on feet: Secondary | ICD-10-CM | POA: Diagnosis not present

## 2017-01-04 DIAGNOSIS — M6281 Muscle weakness (generalized): Secondary | ICD-10-CM | POA: Diagnosis not present

## 2017-01-04 DIAGNOSIS — R2681 Unsteadiness on feet: Secondary | ICD-10-CM | POA: Diagnosis not present

## 2017-01-09 ENCOUNTER — Other Ambulatory Visit: Payer: Self-pay | Admitting: *Deleted

## 2017-01-09 ENCOUNTER — Encounter: Payer: Self-pay | Admitting: *Deleted

## 2017-01-09 DIAGNOSIS — R2681 Unsteadiness on feet: Secondary | ICD-10-CM | POA: Diagnosis not present

## 2017-01-09 DIAGNOSIS — M6281 Muscle weakness (generalized): Secondary | ICD-10-CM | POA: Diagnosis not present

## 2017-01-09 NOTE — Patient Outreach (Signed)
[facility]) Care Management  [facility] Social Work  01/09/2017  [name] 15-Sep-1940 [id number]  Subjective:  "he has fallen several times'  Objective:  CSW to assist patient with commuity based resources to aide in her well-being, quality of life and overall safety/needs.   Current Medications:  Current Outpatient Prescriptions  Medication Sig Dispense Refill  . acetaminophen (TYLENOL) 325 MG tablet Take 650 mg by mouth daily.    Marland Kitchen albuterol (PROAIR HFA) 108 (90 BASE) MCG/ACT inhaler Inhale 2 puffs into the lungs every 4 (four) hours as needed for wheezing or shortness of breath.     Marland Kitchen amLODipine (NORVASC) 10 MG tablet Take 10 mg by mouth daily.    Marland Kitchen aripiprazole (ABILIFY) 10 MG disintegrating tablet Take 1 tablet (10 mg total) by mouth daily. 30 tablet 5  . atorvastatin (LIPITOR) 10 MG tablet Take 10 mg by mouth every other day.     . bismuth subsalicylate (PEPTO BISMOL) 262 MG/15ML suspension Take 30 mLs by mouth 2 (two) times daily as needed for indigestion.     . Calcium Carbonate Antacid (TUMS PO) Take 4 tablets by mouth 4 (four) times daily as needed (heartburn). Reported on 10/06/2015    . clopidogrel (PLAVIX) 75 MG tablet Take 75 mg by mouth daily.    . cyanocobalamin (,VITAMIN B-12,) 1000 MCG/ML injection Inject 1,000 mcg into the muscle every 30 (thirty) days.    . divalproex (DEPAKOTE ER) 500 MG 24 hr tablet Take 500 mg by mouth 2 (two) times daily.    . Fluticasone-Salmeterol (ADVAIR) 250-50 MCG/DOSE AEPB Inhale 1 puff into the lungs daily.    . folic acid (FOLVITE) 1 MG tablet Take 1 mg by mouth daily.    . Melatonin 500 MCG TBDP Take 5 mg by mouth at bedtime.    Marland Kitchen MELATONIN PO Take 5 mg by mouth. 2 tablets at bedtime    . memantine (NAMENDA) 10 MG tablet Take 10 mg by mouth 2 (two) times daily. Reported on 10/06/2015    . metFORMIN (GLUCOPHAGE) 500 MG tablet Take 500 mg by mouth 2 (two) times daily with a meal.    . Multiple Vitamin (MULTIVITAMIN PO)  Take 1 tablet by mouth daily.     Marland Kitchen omeprazole (PRILOSEC) 40 MG capsule Take 40 mg by mouth daily.     Marland Kitchen oxycodone (OXY-IR) 5 MG capsule Take 5 mg by mouth every 4 (four) hours as needed.    Marland Kitchen QUEtiapine (SEROQUEL) 100 MG tablet 1 qhs after 2 nights may increase to 2  qhs 60 tablet 5  . tamsulosin (FLOMAX) 0.4 MG CAPS capsule Take 0.4 mg by mouth daily.    . temazepam (RESTORIL) 15 MG capsule Take 15 mg by mouth at bedtime. Reported on 10/06/2015    . traMADol (ULTRAM) 50 MG tablet Take 50 mg by mouth every 6 (six) hours as needed.    . traZODone (DESYREL) 50 MG tablet Take 100 mg by mouth at bedtime. Reported on 10/06/2015    . valsartan (DIOVAN) 80 MG tablet Take 80 mg by mouth daily.     No current facility-administered medications for this visit.     Functional Status:  In your present state of health, do you have any difficulty performing the following activities: 07/13/2016  Hearing? Y  Vision? N  Difficulty concentrating or making decisions? Y  Walking or climbing stairs? Y  Dressing or bathing? Y  Doing errands, shopping? Y  Preparing Food and eating ? [name]  Using the Toilet? Y  In the past six months, have you accidently leaked urine? Y  Do you have problems with loss of bowel control? Y  Managing your Medications? Y  Managing your Finances? N  Some recent data might be hidden    Fall/Depression Screening:  Fall Risk  07/13/2016 04/10/2016  Falls in the past year? Yes Yes  Number falls in past yr: 2 or more 1  Injury with Fall? Yes Yes  Risk Factor Category  High Fall Risk -  Risk for fall due to : History of fall(s) -  Follow up Falls prevention discussed -   PHQ 2/9 Scores 07/13/2016  PHQ - 2 Score 6  PHQ- 9 Score 27    Assessment:  Social work spoke with patient's caregiver, [name], by phone today. She reports patient continues to fall in the home, and she is about at the  "end of my rope". Social work listened as she expressed her frustration, struggles, and  overall feeling of being overwhelmed as primary caregiver for patient. Social work continues to encourage her to seek legal supports to determine options for long-term placement of patient. [name] reports patient has about $3000 a month of income and is not eligible for Medicaid. Social work encouraged caregiver to consider alternative supports in the home, respite care as well as long-term placement for patient.  Social work discussed goals have been met for patient and social work plans to close referral.  Plan: Social work will close case and advise PCP, and [place] team.      Eduard Clos, MSW, [place] Social Worker  [place] [phone]

## 2017-01-11 DIAGNOSIS — M6281 Muscle weakness (generalized): Secondary | ICD-10-CM | POA: Diagnosis not present

## 2017-01-11 DIAGNOSIS — R2681 Unsteadiness on feet: Secondary | ICD-10-CM | POA: Diagnosis not present

## 2017-01-16 DIAGNOSIS — R2681 Unsteadiness on feet: Secondary | ICD-10-CM | POA: Diagnosis not present

## 2017-01-16 DIAGNOSIS — M6281 Muscle weakness (generalized): Secondary | ICD-10-CM | POA: Diagnosis not present

## 2017-01-18 DIAGNOSIS — R2681 Unsteadiness on feet: Secondary | ICD-10-CM | POA: Diagnosis not present

## 2017-01-18 DIAGNOSIS — M6281 Muscle weakness (generalized): Secondary | ICD-10-CM | POA: Diagnosis not present

## 2017-01-23 DIAGNOSIS — M6281 Muscle weakness (generalized): Secondary | ICD-10-CM | POA: Diagnosis not present

## 2017-01-23 DIAGNOSIS — R2681 Unsteadiness on feet: Secondary | ICD-10-CM | POA: Diagnosis not present

## 2017-01-25 ENCOUNTER — Ambulatory Visit (INDEPENDENT_AMBULATORY_CARE_PROVIDER_SITE_OTHER): Payer: Medicare Other | Admitting: Psychiatry

## 2017-01-25 DIAGNOSIS — F325 Major depressive disorder, single episode, in full remission: Secondary | ICD-10-CM | POA: Diagnosis not present

## 2017-01-25 DIAGNOSIS — Z818 Family history of other mental and behavioral disorders: Secondary | ICD-10-CM

## 2017-01-25 MED ORDER — ARIPIPRAZOLE 10 MG PO TBDP: 10.0000 mg | ORAL_TABLET | Freq: Every day | ORAL | 5 refills | Status: DC

## 2017-01-25 MED ORDER — TEMAZEPAM 15 MG PO CAPS: 15.0000 mg | ORAL_CAPSULE | Freq: Every day | ORAL | 5 refills | Status: DC

## 2017-01-25 MED ORDER — QUETIAPINE FUMARATE 100 MG PO TABS: ORAL_TABLET | ORAL | 5 refills | Status: DC

## 2017-01-25 NOTE — Progress Notes (Signed)
Psychiatric Initial Adult Assessment   Patient Identification: [name] MRN:  [id number] Date of Evaluation:  01/25/2017 Referral Source: Chief Complaint:   Visit Diagnosis: No diagnosis found.  History of Present Illness:   Today the patient is seen with his fiance [name] who he lives with. The patient is clearly better. Unfortunately since I've seen him he's had a number of falls. It for falls and because of that he could not come to see me. This was not related to his medications but simply because he has a gait disturbance. Presently he is in physical therapy to try to deal. The patient himself denies being depressed. He says he feels great. He denies any anxiety. His fiance and generally agrees. She says he's better in a number of ways. First she doesn't seem to be sundowning as much at all. He seems alert and with it. The patient is sleeping and eating very well. He is friendly and engaging. He is compliant. He shows no signs of agitation. His energy level is good. Since her last visit over the phone we returned his Seroquel and increased 200 mg. According to both of them is very helpful. Is less anxious less agitated. The patient denies being suicidal. He is no evidence of tardive dyskinesia or any other problems related to his medications. Associated Signs/Symptoms:Depression Symptoms:  depressed mood, (Hypo) Manic Symptoms:   Anxiety Symptoms:   Psychotic Symptoms:   PTSD Symptoms:   Past Psychiatric History: Multiple antidepressants 2 psychiatric hospitalizations  Previous Psychotropic Medications: Yes   Substance Abuse History in the last 12 months:  No.  Consequences of Substance Abuse:   Past Medical History:  Past Medical History:  Diagnosis Date  . Anxiety    Xanax as needed  . Arthritis    spinal stenosis.  . Asthma   . Bipolar disorder ([place])   . Cataract   . Chronic kidney disease    since NSAID's stopped -function has improved  . COPD (chronic  obstructive pulmonary disease) ([place])   . Crohn's colitis ([place])   . Depression    takes Cymbalta daily  . Diabetes mellitus, type II ([place])   . Diverticulosis 01/2007  . DM (diabetes mellitus) ([place])    takes Januvia now, metformin stopped due to diarrhea  . ED (erectile dysfunction)   . GERD (gastroesophageal reflux disease)    uses Tums  . Glaucoma    surgery to correct  . History of colon polyps   . HLD (hyperlipidemia)    takes Lipitor daily  . HTN (hypertension)    takes Altace and Diovan daily  . Impaired hearing    "getting this checked out on Monday"  . Insomnia   . Internal hemorrhoids 01/2007  . Joint pain   . Joint swelling   . Microscopic colitis   . Nocturia   . Obesity   . Pneumonia 3/09  . Sleep apnea    uses CPAP;has been greater than 49yrs since sleep study- Dr. Elsworth Soho    Past Surgical History:  Procedure Laterality Date  . CATARACT EXTRACTION W/ INTRAOCULAR LENS  IMPLANT, BILATERAL  2011  . CHOLECYSTECTOMY  2011  . COLONOSCOPY W/ BIOPSIES  2008   Looks normal, biopsy suggested a chronic microscopic colitis with rare granulomas  . COLONOSCOPY W/ BIOPSIES  02/2011   diverticulosis, internal hemorrhoids, patchy chronic colitis on random bxs,   . EYE SURGERY     for glaucoma  . HERNIA REPAIR    . LUMBAR LAMINECTOMY/DECOMPRESSION MICRODISCECTOMY N/A  08/04/2015   Procedure: CENTRAL DECOMPRESSION  LUMBAR LAMINECTOMY L4-L5 ;  Surgeon: Latanya Maudlin, MD;  Location: [facility];  Service: Orthopedics;  Laterality: N/A;  . NOSE SURGERY  2011  . PENILE PROSTHESIS IMPLANT  2012  . ROTATOR CUFF REPAIR Bilateral    '12 -left, '13 -right  . SHOULDER ARTHROSCOPY  08/24/2011   Procedure: ARTHROSCOPY SHOULDER;  Surgeon: Metta Clines Supple;  Location: [place];  Service: Orthopedics;  Laterality: Right;  RIGHT SHOULDER ARTHROSCOPY WITH SUBACROMINAL DECOMPRESSION AND DISTAL CLAVICAL RESECTION   . sleep apnea surgery  1997  . TONSILLECTOMY     as a child  . UMBILICAL HERNIA REPAIR   5+yrs ago    Family Psychiatric History:   Family History:  Family History  Problem Relation Age of Onset  . Esophageal cancer Paternal Grandmother   . Heart failure Mother   . Depression Mother   . Alzheimer's disease Brother   . Colon cancer Neg Hx     Social History:   Social History   Social History  . Marital status: Widowed    Spouse name: N/A  . Number of children: N/A  . Years of education: N/A   Social History Main Topics  . Smoking status: Never Smoker  . Smokeless tobacco: Never Used  . Alcohol use No     Comment: very rare  . Drug use: No  . Sexual activity: Not Currently   Other Topics Concern  . 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 Shellfish Allergy Anaphylaxis    Patient informed RN no problem with Betadine 08/04/2015   . Nsaids Other (See Comments)    Due to kidney function    Metabolic Disorder Labs: Lab Results  Component Value Date   HGBA1C 9.9 (H) 08/02/2015   MPG 237 08/02/2015   No results found for: PROLACTIN No results found for: CHOL, TRIG, HDL, CHOLHDL, VLDL, LDLCALC   Current Medications: Current Outpatient Prescriptions  Medication Sig Dispense Refill  . acetaminophen (TYLENOL) 325 MG tablet Take 650 mg by mouth daily.    Marland Kitchen albuterol (PROAIR HFA) 108 (90 BASE) MCG/ACT inhaler Inhale 2 puffs into the lungs every 4 (four) hours as needed for wheezing or shortness of breath.     Marland Kitchen amLODipine (NORVASC) 10 MG tablet Take 10 mg by mouth daily.    Marland Kitchen aripiprazole (ABILIFY) 10 MG disintegrating tablet Take 1 tablet (10 mg total) by mouth daily. 30 tablet 5  . atorvastatin (LIPITOR) 10 MG tablet Take 10 mg by mouth every other day.     . bismuth subsalicylate (PEPTO BISMOL) 262 MG/15ML suspension Take 30 mLs by mouth 2 (two) times daily as needed for  indigestion.     . Calcium Carbonate Antacid (TUMS PO) Take 4 tablets by mouth 4 (four) times daily as needed (heartburn). Reported on 10/06/2015    . clopidogrel (PLAVIX) 75 MG tablet Take 75 mg by mouth daily.    . cyanocobalamin (,VITAMIN B-12,) 1000 MCG/ML injection Inject 1,000 mcg into the muscle every 30 (thirty) days.    . divalproex (DEPAKOTE ER) 500 MG 24 hr tablet Take 500 mg by mouth 2 (two) times daily.    . Fluticasone-Salmeterol (ADVAIR) 250-50 MCG/DOSE AEPB Inhale 1 puff into the lungs daily.    . folic acid (FOLVITE) 1 MG tablet  Take 1 mg by mouth daily.    . Melatonin 500 MCG TBDP Take 5 mg by mouth at bedtime.    Marland Kitchen MELATONIN PO Take 5 mg by mouth. 2 tablets at bedtime    . memantine (NAMENDA) 10 MG tablet Take 10 mg by mouth 2 (two) times daily. Reported on 10/06/2015    . metFORMIN (GLUCOPHAGE) 500 MG tablet Take 500 mg by mouth 2 (two) times daily with a meal.    . Multiple Vitamin (MULTIVITAMIN PO) Take 1 tablet by mouth daily.     Marland Kitchen omeprazole (PRILOSEC) 40 MG capsule Take 40 mg by mouth daily.     Marland Kitchen oxycodone (OXY-IR) 5 MG capsule Take 5 mg by mouth every 4 (four) hours as needed.    Marland Kitchen QUEtiapine (SEROQUEL) 100 MG tablet 1 qhs after 2 nights may increase to 2  qhs 60 tablet 5  . tamsulosin (FLOMAX) 0.4 MG CAPS capsule Take 0.4 mg by mouth daily.    . temazepam (RESTORIL) 15 MG capsule Take 1 capsule (15 mg total) by mouth at bedtime. Reported on 10/06/2015 30 capsule 5  . traMADol (ULTRAM) 50 MG tablet Take 50 mg by mouth every 6 (six) hours as needed.    . traZODone (DESYREL) 50 MG tablet Take 100 mg by mouth at bedtime. Reported on 10/06/2015    . valsartan (DIOVAN) 80 MG tablet Take 80 mg by mouth daily.     No current facility-administered medications for this visit.     Neurologic: Headache: No Seizure: Yes Paresthesias:No  Musculoskeletal: Strength & Muscle Tone: abnormal Gait & Station: unsteady Patient leans: N/A  Psychiatric Specialty Exam: ROS  There  were no vitals taken for this visit.There is no height or weight on file to calculate BMI.  General Appearance: Casual  Eye Contact:  Good  Speech:  Clear and Coherent  Volume:  Normal  Mood:  Depressed  Affect:  Appropriate  Thought Process:  Goal Directed  Orientation:  NA  Thought Content:  Logical  Suicidal Thoughts:  No  Homicidal Thoughts:  No  Memory:  Negative  Judgement:  Impaired  Insight:  Lacking  Psychomotor Activity:  Normal  Concentration:    Recall:  [place] of Knowledge:Fair  Language: Good  Akathisia:  No  Handed:  Right  AIMS (if indicated):    Assets:  Desire for Improvement  ADL's:  Impaired  Cognition: Impaired,  Moderate  Sleep:      Treatment Plan [place]  At this time the patient is clearly improved. I think he has a dementia disorder but I do not think it's severe. For reasons that are not clear his Aricept was discontinued but his pneumatic continues. He's come off of trazodone without a problem. He takes Restoril 15 mg at night which helps him sleep. He also takes melatonin. He presently takes 200 mg a Seroquel which helps him sleep and does not seem to make him oversedated. He'll continue taking 10 mg of Abilify in addition to the Seroquel. Forward ever reason the patient's mood is much improved. Is much more compliant less agitated and overall more cooperative. Both him and his fiance are pleased about his status.   Jerral Ralph, MD 6/14/20184:58 PM

## 2017-01-26 DIAGNOSIS — M6281 Muscle weakness (generalized): Secondary | ICD-10-CM | POA: Diagnosis not present

## 2017-01-26 DIAGNOSIS — R2681 Unsteadiness on feet: Secondary | ICD-10-CM | POA: Diagnosis not present

## 2017-01-30 DIAGNOSIS — R2681 Unsteadiness on feet: Secondary | ICD-10-CM | POA: Diagnosis not present

## 2017-01-30 DIAGNOSIS — M6281 Muscle weakness (generalized): Secondary | ICD-10-CM | POA: Diagnosis not present

## 2017-02-01 DIAGNOSIS — M6281 Muscle weakness (generalized): Secondary | ICD-10-CM | POA: Diagnosis not present

## 2017-02-01 DIAGNOSIS — R2681 Unsteadiness on feet: Secondary | ICD-10-CM | POA: Diagnosis not present

## 2017-02-06 DIAGNOSIS — M6281 Muscle weakness (generalized): Secondary | ICD-10-CM | POA: Diagnosis not present

## 2017-02-06 DIAGNOSIS — R2681 Unsteadiness on feet: Secondary | ICD-10-CM | POA: Diagnosis not present

## 2017-02-09 DIAGNOSIS — M6281 Muscle weakness (generalized): Secondary | ICD-10-CM | POA: Diagnosis not present

## 2017-02-09 DIAGNOSIS — R2681 Unsteadiness on feet: Secondary | ICD-10-CM | POA: Diagnosis not present

## 2017-02-13 DIAGNOSIS — R2681 Unsteadiness on feet: Secondary | ICD-10-CM | POA: Diagnosis not present

## 2017-02-13 DIAGNOSIS — M6281 Muscle weakness (generalized): Secondary | ICD-10-CM | POA: Diagnosis not present

## 2017-02-16 DIAGNOSIS — M6281 Muscle weakness (generalized): Secondary | ICD-10-CM | POA: Diagnosis not present

## 2017-02-16 DIAGNOSIS — R2681 Unsteadiness on feet: Secondary | ICD-10-CM | POA: Diagnosis not present

## 2017-02-20 DIAGNOSIS — R2681 Unsteadiness on feet: Secondary | ICD-10-CM | POA: Diagnosis not present

## 2017-02-20 DIAGNOSIS — M6281 Muscle weakness (generalized): Secondary | ICD-10-CM | POA: Diagnosis not present

## 2017-02-22 DIAGNOSIS — L570 Actinic keratosis: Secondary | ICD-10-CM | POA: Diagnosis not present

## 2017-02-22 DIAGNOSIS — L821 Other seborrheic keratosis: Secondary | ICD-10-CM | POA: Diagnosis not present

## 2017-02-22 DIAGNOSIS — L814 Other melanin hyperpigmentation: Secondary | ICD-10-CM | POA: Diagnosis not present

## 2017-02-22 DIAGNOSIS — D0439 Carcinoma in situ of skin of other parts of face: Secondary | ICD-10-CM | POA: Diagnosis not present

## 2017-02-23 DIAGNOSIS — R2681 Unsteadiness on feet: Secondary | ICD-10-CM | POA: Diagnosis not present

## 2017-02-23 DIAGNOSIS — M6281 Muscle weakness (generalized): Secondary | ICD-10-CM | POA: Diagnosis not present

## 2017-03-10 DIAGNOSIS — R2689 Other abnormalities of gait and mobility: Secondary | ICD-10-CM | POA: Diagnosis not present

## 2017-03-10 DIAGNOSIS — E119 Type 2 diabetes mellitus without complications: Secondary | ICD-10-CM | POA: Diagnosis not present

## 2017-03-10 DIAGNOSIS — J449 Chronic obstructive pulmonary disease, unspecified: Secondary | ICD-10-CM | POA: Diagnosis not present

## 2017-03-10 DIAGNOSIS — N289 Disorder of kidney and ureter, unspecified: Secondary | ICD-10-CM | POA: Diagnosis not present

## 2017-03-10 DIAGNOSIS — R079 Chest pain, unspecified: Secondary | ICD-10-CM | POA: Diagnosis not present

## 2017-03-10 DIAGNOSIS — S0990XA Unspecified injury of head, initial encounter: Secondary | ICD-10-CM | POA: Diagnosis not present

## 2017-03-10 DIAGNOSIS — I10 Essential (primary) hypertension: Secondary | ICD-10-CM | POA: Diagnosis not present

## 2017-03-10 DIAGNOSIS — E7800 Pure hypercholesterolemia, unspecified: Secondary | ICD-10-CM | POA: Diagnosis not present

## 2017-03-10 DIAGNOSIS — R269 Unspecified abnormalities of gait and mobility: Secondary | ICD-10-CM | POA: Diagnosis not present

## 2017-03-10 DIAGNOSIS — Z8673 Personal history of transient ischemic attack (TIA), and cerebral infarction without residual deficits: Secondary | ICD-10-CM | POA: Diagnosis not present

## 2017-03-10 DIAGNOSIS — G4489 Other headache syndrome: Secondary | ICD-10-CM | POA: Diagnosis not present

## 2017-04-13 IMAGING — RF DG MYELOGRAPHY LUMBAR INJ LUMBOSACRAL
12 of 17 series · 12 of 17 positions shown · non-contrast
Comparison: Lumbar spine MRI 06/23/2015 from [REDACTED]. Lumbar spine MRI 10/13/2014 from [REDACTED].

CLINICAL DATA: Chronic left-sided low back pain with left-sided
sciatica.
TECHNIQUE: Contiguous axial images were obtained through the Lumbar spine after
the intrathecal infusion of contrast. Coronal and sagittal
reconstructions were obtained of the axial image sets.

[Series 2: (hospital) · 1 of 1 slices shown]
[im 1/1]
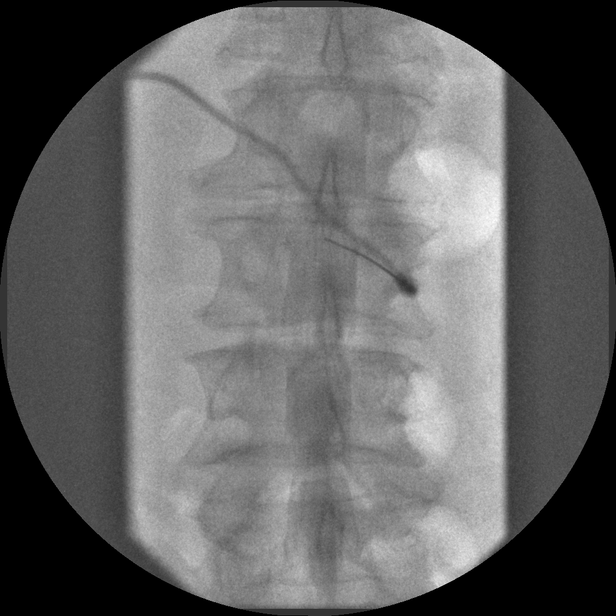

[Series 4: myelogram  white · 1 of 1 slices shown (1 of 11)]
[im 1/1]
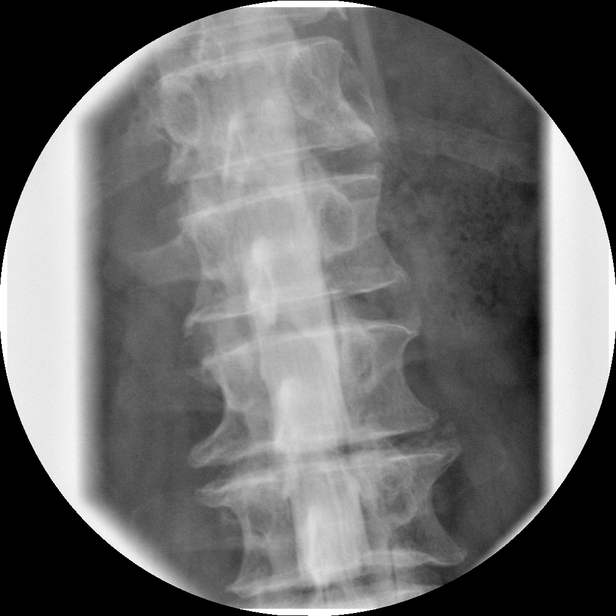

[Series 6: myelogram  white · 1 of 1 slices shown (2 of 11)]
[im 1/1]
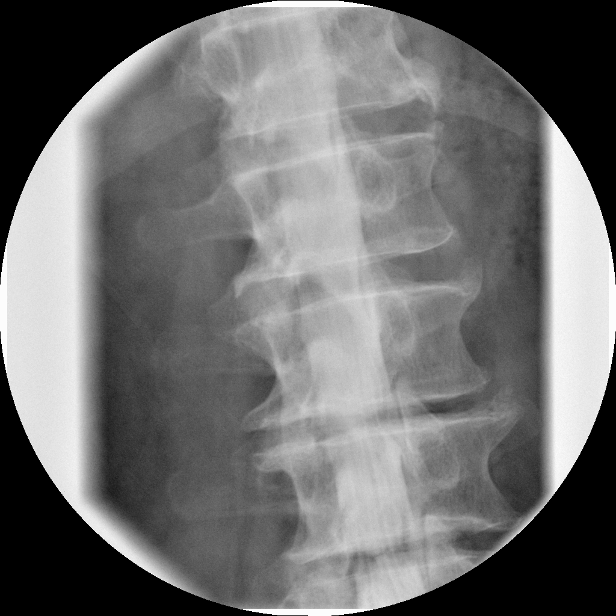

[Series 7: myelogram  white · 1 of 1 slices shown (3 of 11)]
[im 1/1]
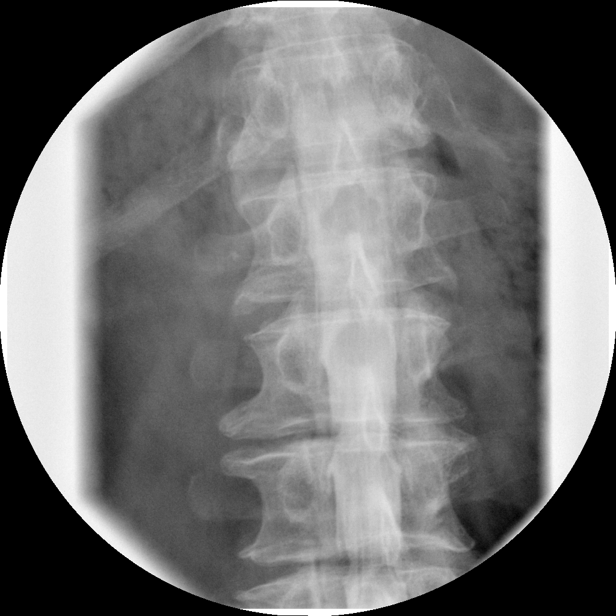

[Series 9: myelogram  white · 1 of 1 slices shown (4 of 11)]
[im 1/1]
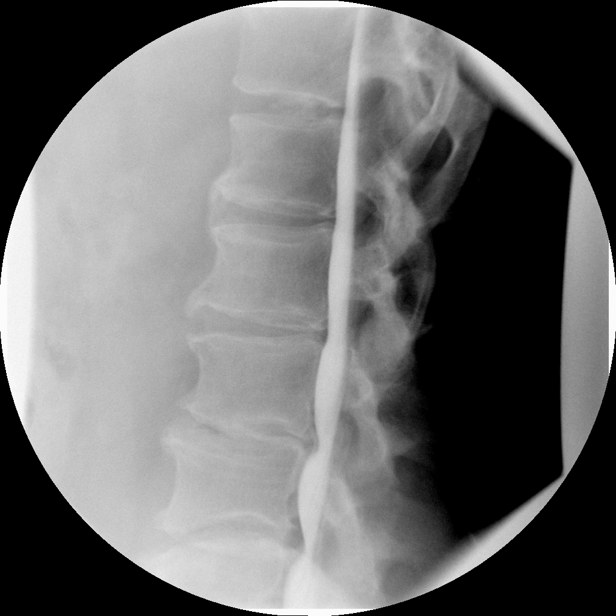

[Series 10: myelogram  white · 1 of 1 slices shown (5 of 11)]
[im 1/1]
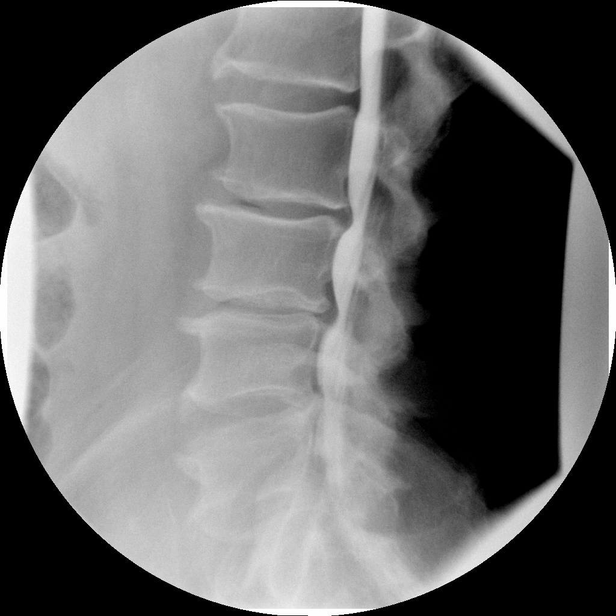

[Series 12: myelogram  white · 1 of 1 slices shown (6 of 11)]
[im 1/1]
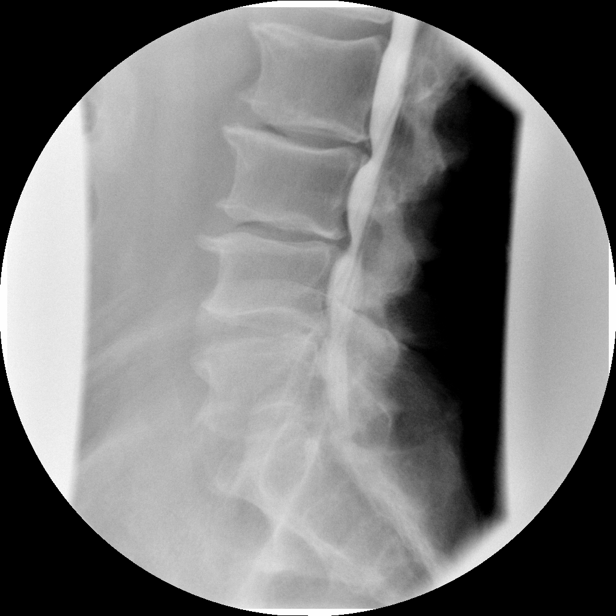

[Series 13: myelogram  white · 1 of 1 slices shown (7 of 11)]
[im 1/1]
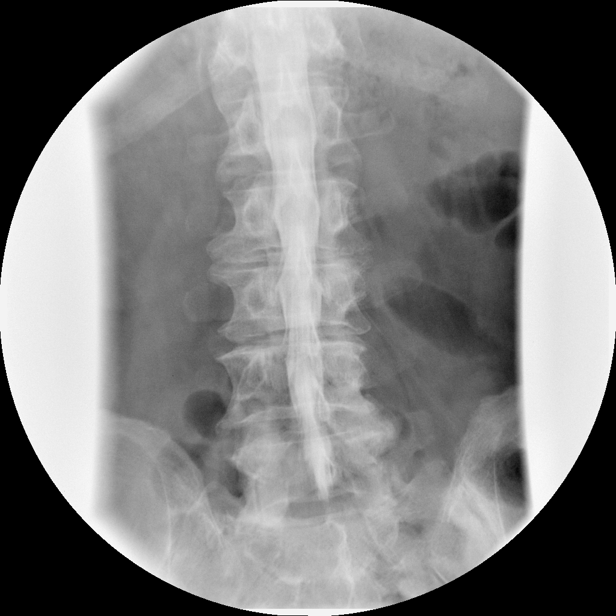

[Series 15: myelogram  white · 1 of 1 slices shown (8 of 11)]
[im 1/1]
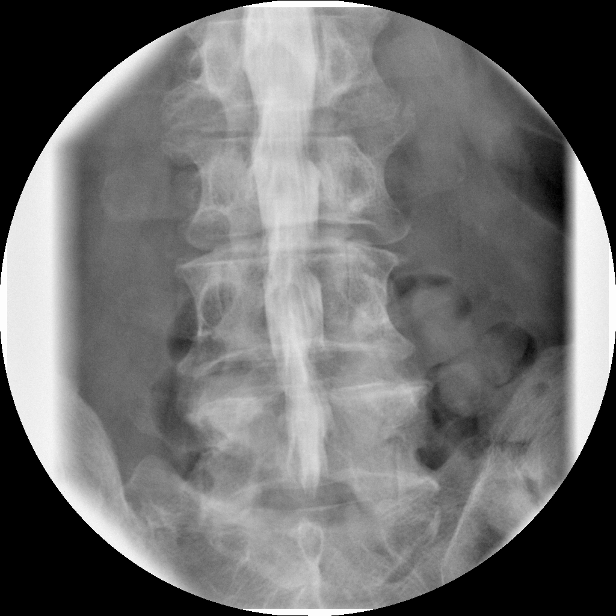

[Series 16: myelogram  white · 1 of 1 slices shown (9 of 11)]
[im 1/1]
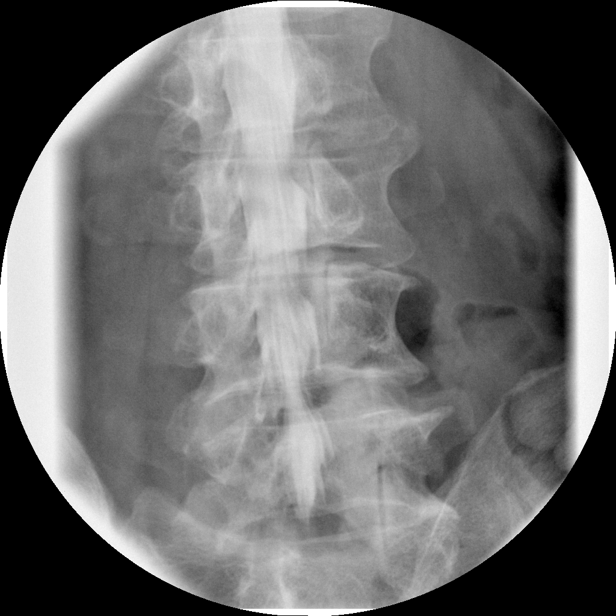

[Series 17: myelogram  white · 1 of 1 slices shown (10 of 11)]
[im 1/1]
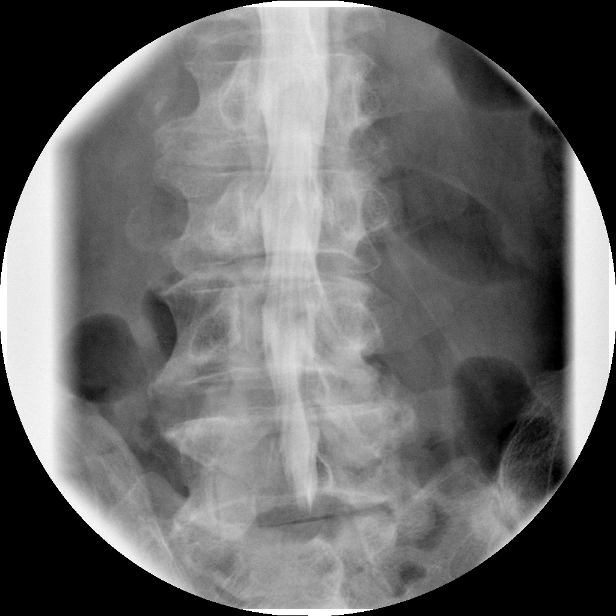

[Series 19: myelogram  white · 1 of 1 slices shown (11 of 11)]
[im 1/1]
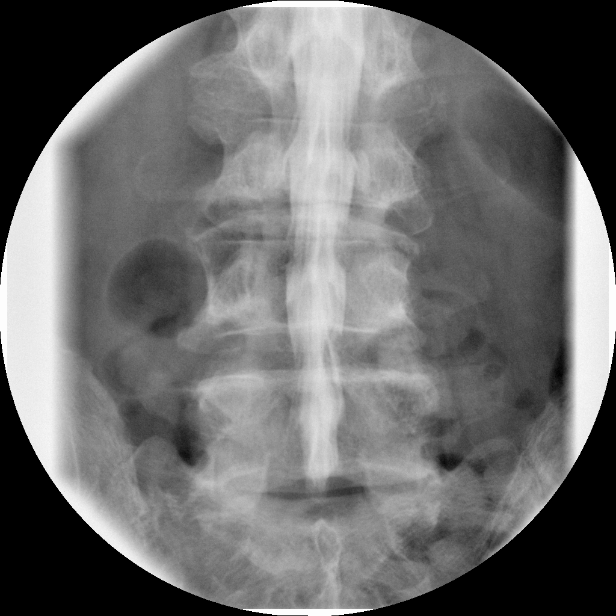

[12 of 17 positions shown; findings below may reference images not displayed]

EXAM:
LUMBAR MYELOGRAM

FLUOROSCOPY TIME:  Fluoroscopy Time (in minutes and seconds): 1
minute 9 seconds

Number of Acquired Images:  16

PROCEDURE:
After thorough discussion of risks and benefits of the procedure
including bleeding, infection, injury to nerves, blood vessels,
adjacent structures as well as headache and CSF leak, written and
oral informed consent was obtained. Consent was obtained by Dr.
Nadyanne Yago. Time out form was completed.

Patient was positioned prone on the fluoroscopy table. Local
anesthesia was provided with 1% lidocaine without epinephrine after
prepped and draped in the usual sterile fashion. Puncture was
performed at L2-3 using a 3 1/2 inch 22-gauge spinal needle via a
right interlaminar approach. Using a single pass through the dura,
the needle was placed within the thecal sac, with return of clear
CSF. 15 mL of Isovue-M 200 was injected into the thecal sac, with
normal opacification of the nerve roots and cauda equina consistent
with free flow within the subarachnoid space.

I personally performed the lumbar puncture and administered the
intrathecal contrast. I also personally supervised acquisition of
the myelogram images.
FINDINGS: LUMBAR MYELOGRAM FINDINGS:

There is trace retrolisthesis of L2 on L3 without change during
flexion or extension. Lumbar disc space narrowing as moderate at
L2-3 and L3-4 with evidence of vacuum disc phenomenon. Small to
moderate-sized ventral extradural defects are present from L2-3 to
to L4-5, largest at L3-4. There is evidence of mild-to-moderate
spinal stenosis at L3-4 and L4-5, similar between prone and upright
positioning. There is evidence of bilateral lateral recess narrowing
at L4-5 greater than L3-4, more prominent on the right at both
levels.

CT LUMBAR MYELOGRAM FINDINGS:

Trace retrolisthesis of L2 on L3 is unchanged. Vertebral body
heights are preserved without evidence of compression fracture.
Anterolateral marginal osteophytosis is present throughout the
lumbar spine. Mild-to-moderate disc space narrowing and vacuum disc
phenomenon are present at L2-3 and L3-4 with associated degenerative
endplate changes.

The conus medullaris terminates at T12. Aortoiliac atherosclerotic
calcification is partially visualized. A 1.4 cm right lower pole
renal cyst is noted.

L1-2:  Mild facet arthrosis without disc herniation or stenosis.

L2-3: Prominent circumferential disc bulging and mild facet
arthrosis result in mild bilateral neural foraminal stenosis and
minimal right lateral recess narrowing without significant spinal
stenosis. There is mild deflection of the left greater than right L2
nerves lateral to the foramina by the bulging disc and asymmetric
left lateral osteophytosis.

L3-4: Moderate circumferential disc bulging and mild facet and
ligamentum flavum hypertrophy result in mild-to-moderate spinal
stenosis, mild bilateral lateral recess stenosis, and mild bilateral
neural foraminal stenosis.

L4-5: Moderate circumferential disc bulging and moderate facet and
ligamentum flavum hypertrophy result in mild-to-moderate spinal
stenosis, moderate bilateral lateral recess stenosis, and
mild-to-moderate right and minimal left neural foraminal stenosis.

L5-S1: Mild disc bulging, ligamentum flavum hypertrophy, and mild
facet hypertrophy result in moderate right and mild left lateral
recess stenosis and minimal left neural foraminal narrowing without
spinal stenosis. Calcified ligamentum flavum is mainly responsible
for the right lateral recess stenosis.

The lateral recess stenosis at L4-5 is slightly more prominent than
on the 10/13/2014 MRI, with the remainder of the examination not
significantly changed.
IMPRESSION: 1. Moderate multilevel lumbar disc and facet degeneration resulting
in mild-to-moderate spinal stenosis at L3-4 and L4-5.
2. Moderate bilateral lateral recess stenosis at L4-5.
3. Moderate right and mild left lateral recess stenosis at L5-S1.

## 2017-04-25 ENCOUNTER — Ambulatory Visit (HOSPITAL_COMMUNITY): Payer: Medicare Other | Admitting: Psychiatry

## 2017-05-03 ENCOUNTER — Ambulatory Visit (HOSPITAL_COMMUNITY): Payer: Medicare Other | Admitting: Psychiatry

## 2017-05-07 DIAGNOSIS — R269 Unspecified abnormalities of gait and mobility: Secondary | ICD-10-CM | POA: Diagnosis not present

## 2017-05-07 DIAGNOSIS — E1129 Type 2 diabetes mellitus with other diabetic kidney complication: Secondary | ICD-10-CM | POA: Diagnosis not present

## 2017-05-07 DIAGNOSIS — G20 Parkinson's disease: Secondary | ICD-10-CM | POA: Diagnosis not present

## 2017-05-07 DIAGNOSIS — M6281 Muscle weakness (generalized): Secondary | ICD-10-CM | POA: Diagnosis not present

## 2017-05-07 DIAGNOSIS — E538 Deficiency of other specified B group vitamins: Secondary | ICD-10-CM | POA: Diagnosis not present

## 2017-05-07 DIAGNOSIS — Z23 Encounter for immunization: Secondary | ICD-10-CM | POA: Diagnosis not present

## 2017-05-07 DIAGNOSIS — G3109 Other frontotemporal dementia: Secondary | ICD-10-CM | POA: Diagnosis not present

## 2017-05-07 DIAGNOSIS — Z6827 Body mass index (BMI) 27.0-27.9, adult: Secondary | ICD-10-CM | POA: Diagnosis not present

## 2017-05-23 ENCOUNTER — Encounter: Payer: Self-pay | Admitting: Neurology

## 2017-06-13 DIAGNOSIS — K219 Gastro-esophageal reflux disease without esophagitis: Secondary | ICD-10-CM | POA: Diagnosis not present

## 2017-06-13 DIAGNOSIS — K2270 Barrett's esophagus without dysplasia: Secondary | ICD-10-CM | POA: Diagnosis not present

## 2017-06-14 NOTE — Progress Notes (Signed)
[name] was seen today in the movement disorders clinic for neurologic consultation at the request of Marton Redwood, MD.  The consultation is for the evaluation of parkinsonism.  He previously saw Dr. Sabra Heck and Dr. Metta Clines at [facility] neurology.  I have reviewed those records and PCP records.  This patient is accompanied in the office by his caregiver (who states that she is also fiancee) who supplements the history.  He is on carbidopa/levodopa 10/100, 1 tablet at 8:30am/12:30/bedtime.  He goes to the senior center during the day 5 days per week.    Specific Symptoms:  Tremor: Yes.  , most at rest and both shake equally.  Pt states that "I don't know if I have Parkinsons."   Family hx of similar:  No. Voice: no change Sleep: sleeps well  Vivid Dreams:  Yes.    Acting out dreams:  No. per patient but caregiver describes some wandering at night but she doesn't think that he is awake Wet Pillows: No. Postural symptoms:  Yes.   - comes and goes; cannot walk unless someone holds his hands.    Falls?  Yes.  , last 6 months ago Bradykinesia symptoms: shuffling gait, slow movements and difficulty getting out of a chair Loss of smell:  Yes.   Loss of taste:  No. Urinary Incontinence:  Yes.   (but usually makes it to bathroom with urine; occasionally has bowel incontinence) Difficulty Swallowing:  No. Handwriting, micrographia: Yes.   Trouble with ADL's:  No.  Trouble buttoning clothing: No. Depression:  Yes.  , but meds for bipolar depression has helped.   Memory changes:  No. Hallucinations:  No.  visual distortions: No. N/V:  No. Lightheaded:  No.  Syncope: Yes.   (sounds like he had intermittent MS change and they thought that it was seizure.  Was put in EMU at [place].  I reviewed records from 12/2014 and he was admitted for shaking spells.   Upon initial admission at [facility], the patient was put on maintenance depakote only. He was also hooked up to LTM. Of note, the  preliminary read of the EEG by the on-call resident was concerning for subclinical seizures (no clinical correlate). Therefore, the patient was loaded and started on dilantin. However, the official read of the EEG was no seizures or epileptiform discharges. Subsequently, the primary team was concerned that the patient may be having non-epileptic spells. The patient was then taken off of depakote and dilantin altogether. He did proceed to have multiple typical spells consisting of generalized tremulousness. There was no EEG abnormality associated with these spells. 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 He was not discharged on AEDs and the presumed source of these spells is stress-induced  Diplopia:  No. Dyskinesia:  No.   PREVIOUS MEDICATIONS: Sinemet Aricept (has been on and off over the years according to records)  ALLERGIES:   Allergies  Allergen Reactions  . Shellfish Allergy Anaphylaxis    Patient informed RN no problem with Betadine 08/04/2015     CURRENT MEDICATIONS:  Outpatient Encounter Medications as of 06/18/2017  Medication Sig  . acetaminophen (TYLENOL) 325 MG tablet Take 650 mg by mouth daily.  Marland Kitchen albuterol (PROAIR HFA) 108 (90 BASE) MCG/ACT inhaler Inhale 2 puffs into the lungs every 4 (four) hours as needed for wheezing or shortness of breath.   Marland Kitchen amLODipine (NORVASC) 10 MG tablet Take 10 mg by mouth daily.  . ARIPiprazole (ABILIFY) 2 MG tablet  Take 2 mg daily by mouth.  Marland Kitchen atorvastatin (LIPITOR) 10 MG tablet Take 10 mg by mouth every other day.   . bismuth subsalicylate (PEPTO BISMOL) 262 MG/15ML suspension Take 30 mLs by mouth 2 (two) times daily as needed for indigestion.   . Calcium Carbonate Antacid (TUMS PO) Take 4 tablets by mouth 4 (four) times daily as needed (heartburn). Reported on 10/06/2015  . carbidopa-levodopa (SINEMET IR) 10-100 MG tablet Take 1 tablet 3 (three) times daily by mouth.  . clopidogrel (PLAVIX) 75 MG  tablet Take 75 mg by mouth daily.  . cyanocobalamin (,VITAMIN B-12,) 1000 MCG/ML injection Inject 1,000 mcg into the muscle every 30 (thirty) days.  . divalproex (DEPAKOTE ER) 500 MG 24 hr tablet Take 500 mg by mouth 2 (two) times daily.  . Fluticasone-Salmeterol (ADVAIR) 250-50 MCG/DOSE AEPB Inhale 1 puff into the lungs daily.  . folic acid (FOLVITE) 1 MG tablet Take 1 mg by mouth daily.  Marland Kitchen levothyroxine (SYNTHROID, LEVOTHROID) 50 MCG tablet Take 50 mcg daily before breakfast by mouth.  . MELATONIN PO Take 9 mg daily by mouth. 2 tablets at bedtime  . memantine (NAMENDA) 10 MG tablet Take 10 mg by mouth 2 (two) times daily. Reported on 10/06/2015  . metFORMIN (GLUCOPHAGE) 500 MG tablet Take 500 mg by mouth 2 (two) times daily with a meal.  . Multiple Vitamin (MULTIVITAMIN PO) Take 1 tablet by mouth daily.   Marland Kitchen oxycodone (OXY-IR) 5 MG capsule Take 5 mg by mouth every 4 (four) hours as needed.  . pantoprazole (PROTONIX) 40 MG tablet Take 40 mg daily by mouth.  . QUEtiapine (SEROQUEL) 100 MG tablet 1 qhs after 2 nights may increase to 2  qhs (Patient taking differently: Take 200 mg at bedtime by mouth. )  . ranitidine (ZANTAC) 150 MG tablet Take 150 mg 2 (two) times daily by mouth.  . tamsulosin (FLOMAX) 0.4 MG CAPS capsule Take 0.4 mg by mouth daily.  . temazepam (RESTORIL) 15 MG capsule Take 1 capsule (15 mg total) by mouth at bedtime. Reported on 10/06/2015  . traMADol (ULTRAM) 50 MG tablet Take 50 mg by mouth every 6 (six) hours as needed.  . traZODone (DESYREL) 50 MG tablet Take 100 mg by mouth at bedtime. Reported on 10/06/2015  . [DISCONTINUED] aripiprazole (ABILIFY) 10 MG disintegrating tablet Take 1 tablet (10 mg total) by mouth daily.  . [DISCONTINUED] Melatonin 500 MCG TBDP Take 5 mg by mouth at bedtime.  . [DISCONTINUED] omeprazole (PRILOSEC) 40 MG capsule Take 40 mg by mouth daily.   . [DISCONTINUED] valsartan (DIOVAN) 80 MG tablet Take 80 mg by mouth daily.   No facility-administered  encounter medications on file as of 06/18/2017.     PAST MEDICAL HISTORY:   Past Medical History:  Diagnosis Date  . Anxiety    Xanax as needed  . Arthritis    spinal stenosis.  . Asthma   . Bipolar disorder ([place])   . Cataract   . Chronic kidney disease    since NSAID's stopped -function has improved  . COPD (chronic obstructive pulmonary disease) ([place])   . Crohn's colitis ([place])   . Depression    takes Cymbalta daily  . Diabetes mellitus, type II ([place])   . Diverticulosis 01/2007  . DM (diabetes mellitus) ([place])    takes Januvia now, metformin stopped due to diarrhea  . ED (erectile dysfunction)   . GERD (gastroesophageal reflux disease)    uses Tums  . Glaucoma    surgery to  correct  . History of colon polyps   . HLD (hyperlipidemia)    takes Lipitor daily  . HTN (hypertension)    takes Altace and Diovan daily  . Impaired hearing    "getting this checked out on Monday"  . Insomnia   . Internal hemorrhoids 01/2007  . Joint pain   . Joint swelling   . Microscopic colitis   . Nocturia   . Obesity   . Pneumonia 3/09  . Sleep apnea    uses CPAP;has been greater than 19yrs since sleep study- Dr. Elsworth Soho    PAST SURGICAL HISTORY:   Past Surgical History:  Procedure Laterality Date  . CATARACT EXTRACTION W/ INTRAOCULAR LENS  IMPLANT, BILATERAL  2011  . CHOLECYSTECTOMY  2011  . COLONOSCOPY W/ BIOPSIES  2008   Looks normal, biopsy suggested a chronic microscopic colitis with rare granulomas  . COLONOSCOPY W/ BIOPSIES  02/2011   diverticulosis, internal hemorrhoids, patchy chronic colitis on random bxs,   . EYE SURGERY     for glaucoma  . HERNIA REPAIR    . NOSE SURGERY  2011  . PENILE PROSTHESIS IMPLANT  2012  . ROTATOR CUFF REPAIR Bilateral    '12 -left, '13 -right  . sleep apnea surgery  1997  . TONSILLECTOMY     as a child  . UMBILICAL HERNIA REPAIR  5+yrs ago    SOCIAL HISTORY:   Social History   Socioeconomic History  . Marital status: Widowed    Spouse  name: Not on file  . Number of children: Not on file  . Years of education: Not on file  . Highest education level: Not on file  Social Needs  . Financial resource strain: Not on file  . Food insecurity - worry: Not on file  . Food insecurity - inability: Not on file  . Transportation needs - medical: Not on file  . Transportation needs - non-medical: Not on file  Occupational History  . Not on file  Tobacco Use  . Smoking status: Never Smoker  . Smokeless tobacco: Never Used  Substance and Sexual Activity  . Alcohol use: No    Comment: very rare  . Drug use: No  . Sexual activity: Not Currently  Other Topics Concern  . 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 PGM  (Not Specified)  . Mother  Deceased  . Brother  (Not Specified)  . Father  Deceased  . Neg Hx  (Not Specified)    ROS:  A complete 10 system review of systems was obtained and was unremarkable apart from what is mentioned above.  PHYSICAL EXAMINATION:    VITALS:   Vitals:   06/18/17 0923  BP: 120/78  Pulse: 74  SpO2: 97%  Weight: 195 lb (88.5 kg)  Height: 5\' 8"  (1.727 m)    GEN:  The patient appears stated age and is in NAD. HEENT:  Normocephalic, atraumatic.  The mucous membranes are dry. The superficial temporal arteries are without ropiness or tenderness. CV:  RRR Lungs:  CTAB Neck/HEME:  There are no carotid bruits bilaterally.  Neurological examination:  Orientation: The patient is alert and oriented x3. Fund of knowledge is appropriate.  Recent and remote memory are intact.  Attention and concentration are normal.    Able to name objects and repeat phrases. Cranial  nerves: There is good facial symmetry. Pupils are equal round and reactive to light bilaterally. Fundoscopic exam is attempted but the disc margins are not well visualized  bilaterally.  Extraocular muscles are intact. The visual fields are full to confrontational testing. The speech is fluent and clear. Soft palate rises symmetrically and there is no tongue deviation. Hearing is intact to conversational tone. Sensation: Sensation is intact to light and pinprick throughout (facial, trunk, extremities). Vibration is intact at the bilateral big toe but it is decreased in a distal fashion. There is no extinction with double simultaneous stimulation. There is no sensory dermatomal level identified. Motor: Strength is 5/5 in the bilateral upper and lower extremities.   Shoulder shrug is equal and symmetric.  There is no pronator drift. Deep tendon reflexes: Deep tendon reflexes are 2+/4 at the bilateral biceps, triceps, brachioradialis, patella and 1/4 at the bilateral achilles. Plantar responses are downgoing bilaterally.  Movement examination: Tone: There is normal tone in the bilateral upper extremities.  The tone in the lower extremities is normal.  Abnormal movements: there is no significant resting tremor.  Mild postural tremor Coordination:  There is no decremation with RAM's, with any form of RAMS, including alternating supination and pronation of the forearm, hand opening and closing, finger taps, heel taps and toe taps. Gait and Station: The patient has mild difficulty arising out of a deep-seated chair without the use of the hands but is able to do it on the first attempt. The patient's stride length is decreased with festinating quality.  ASSESSMENT/PLAN:  1.  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  It does appear that his abilify dosage has greatly decreased from 10 mg to 2mg  over the years so perhaps his psychiatrist has been trying to wean.   He is also on seroquel and while this can also cause parkinsonism, its  affinity for the D2 receptor is less than abilify. He is not on a small dose of seroquel, however, and this may be contributing.  I explained that one clinically cannot tell the difference between idiopathic parkinsons disease and secondary parkinsonism.  I also explained that even if one is able to get off of the medication, it can take up to 6 months to clinically definitively know if this is idiopathic parkinsons disease.  I suspect it is all neuroleptic induced.  I did not advise that the patient go off of medication, as this needs to be discussed with the patients prescribing physician.  I did, however, tell the patient that the longer one is on the medication, the worse the symptoms can get.  The patient has been on this combination of meds a long time and certainly may need these.  I suspect he cannot come off of these based on his history.  His fiancee agrees and I stressed many times that he should not change or alter his medications at all, but these are conversations he can have with his psychiatrist.  -pt also is on VPA which can contribute to tremor.  Again, I didn't recommend that he stop that but just gave him information.  -I did tell the patient that I am not sure that it makes sense for one to be on levodopa (dopamine) while they are on medications that block dopamine.  This generally does not make physiologic sense.  I have reviewed an  extensive number of records from his prior neurologist and am not sure if the levodopa is helping or not.  In addition, he is only on 30 mg of carbidopa total per day and there is much evidence that one needs about 75 mg of carbidopa per dayin order to get levodopa into the brain.  My suspicion is that the medication is not doing much for him.  He would like to try to wean off of it and I think that is reasonable.  We will try this over the next few weeks and then call him to see how he is doing.  -We will send him for physical therapy at [facility].  -He is  already enrolled in the senior center 5 days/week  2.  As above, we will call the patient in a few weeks.  I suspect that he will noticed no significant change off of levodopa.  He probably does not need to follow-up here on a regular basis.  Greater than 50% of the 65-minute visit was spent in counseling with the patient and his fiance.  Cc:  Marton Redwood, MD

## 2017-06-18 ENCOUNTER — Encounter: Payer: Self-pay | Admitting: Neurology

## 2017-06-18 ENCOUNTER — Ambulatory Visit (INDEPENDENT_AMBULATORY_CARE_PROVIDER_SITE_OTHER): Payer: Medicare Other | Admitting: Neurology

## 2017-06-18 ENCOUNTER — Telehealth: Payer: Self-pay | Admitting: Neurology

## 2017-06-18 VITALS — BP 120/78 | HR 74 | Ht 68.0 in | Wt 195.0 lb

## 2017-06-18 DIAGNOSIS — G2111 Neuroleptic induced parkinsonism: Secondary | ICD-10-CM | POA: Diagnosis not present

## 2017-06-18 NOTE — Telephone Encounter (Signed)
Referral faxed to [facility] for PT at [phone] with confirmation received.

## 2017-06-18 NOTE — Patient Instructions (Signed)
1. Referral sent to [facility] for physical therapy. They will call you to schedule.   2. Drop Levodopa as follows:  Take Carbidopa Levodopa two times daily for one week Take Carbidopa Levodopa one time daily for one week Then stop We will call to see how you are doing  3. Call us if needed.

## 2017-06-25 DIAGNOSIS — R2689 Other abnormalities of gait and mobility: Secondary | ICD-10-CM | POA: Diagnosis not present

## 2017-06-25 DIAGNOSIS — M6281 Muscle weakness (generalized): Secondary | ICD-10-CM | POA: Diagnosis not present

## 2017-07-10 ENCOUNTER — Telehealth: Payer: Self-pay | Admitting: Neurology

## 2017-07-10 NOTE — Telephone Encounter (Signed)
Spoke with patient and caregiver to see how he is doing after stopping Levodopa.  They state they decided not to stop medication and decided to follow up in the future with Dr. Sabra Heck in [place].  Dr. Carles Collet Juluis Rainier.

## 2017-07-18 ENCOUNTER — Ambulatory Visit (HOSPITAL_COMMUNITY): Payer: Medicare Other | Admitting: Psychiatry

## 2017-07-18 DIAGNOSIS — F418 Other specified anxiety disorders: Secondary | ICD-10-CM | POA: Diagnosis not present

## 2017-07-18 DIAGNOSIS — G4733 Obstructive sleep apnea (adult) (pediatric): Secondary | ICD-10-CM | POA: Diagnosis not present

## 2017-07-18 DIAGNOSIS — Z7982 Long term (current) use of aspirin: Secondary | ICD-10-CM | POA: Diagnosis not present

## 2017-07-18 DIAGNOSIS — I129 Hypertensive chronic kidney disease with stage 1 through stage 4 chronic kidney disease, or unspecified chronic kidney disease: Secondary | ICD-10-CM | POA: Diagnosis not present

## 2017-07-18 DIAGNOSIS — E785 Hyperlipidemia, unspecified: Secondary | ICD-10-CM | POA: Diagnosis not present

## 2017-07-18 DIAGNOSIS — K449 Diaphragmatic hernia without obstruction or gangrene: Secondary | ICD-10-CM | POA: Diagnosis not present

## 2017-07-18 DIAGNOSIS — K2270 Barrett's esophagus without dysplasia: Secondary | ICD-10-CM | POA: Diagnosis not present

## 2017-07-18 DIAGNOSIS — K219 Gastro-esophageal reflux disease without esophagitis: Secondary | ICD-10-CM | POA: Diagnosis not present

## 2017-07-18 DIAGNOSIS — N400 Enlarged prostate without lower urinary tract symptoms: Secondary | ICD-10-CM | POA: Diagnosis not present

## 2017-07-18 DIAGNOSIS — Z9989 Dependence on other enabling machines and devices: Secondary | ICD-10-CM | POA: Diagnosis not present

## 2017-07-18 DIAGNOSIS — Z7984 Long term (current) use of oral hypoglycemic drugs: Secondary | ICD-10-CM | POA: Diagnosis not present

## 2017-07-18 DIAGNOSIS — G4730 Sleep apnea, unspecified: Secondary | ICD-10-CM | POA: Diagnosis not present

## 2017-07-18 DIAGNOSIS — Z7951 Long term (current) use of inhaled steroids: Secondary | ICD-10-CM | POA: Diagnosis not present

## 2017-07-18 DIAGNOSIS — M1990 Unspecified osteoarthritis, unspecified site: Secondary | ICD-10-CM | POA: Diagnosis not present

## 2017-07-18 DIAGNOSIS — J45909 Unspecified asthma, uncomplicated: Secondary | ICD-10-CM | POA: Diagnosis not present

## 2017-07-18 DIAGNOSIS — Z79899 Other long term (current) drug therapy: Secondary | ICD-10-CM | POA: Diagnosis not present

## 2017-07-18 DIAGNOSIS — K209 Esophagitis, unspecified: Secondary | ICD-10-CM | POA: Diagnosis not present

## 2017-07-18 DIAGNOSIS — K210 Gastro-esophageal reflux disease with esophagitis: Secondary | ICD-10-CM | POA: Diagnosis not present

## 2017-07-18 DIAGNOSIS — E1122 Type 2 diabetes mellitus with diabetic chronic kidney disease: Secondary | ICD-10-CM | POA: Diagnosis not present

## 2017-07-18 DIAGNOSIS — E039 Hypothyroidism, unspecified: Secondary | ICD-10-CM | POA: Diagnosis not present

## 2017-07-18 DIAGNOSIS — N183 Chronic kidney disease, stage 3 (moderate): Secondary | ICD-10-CM | POA: Diagnosis not present

## 2017-07-18 HISTORY — PX: ESOPHAGOGASTRODUODENOSCOPY: SHX1529

## 2017-07-22 ENCOUNTER — Other Ambulatory Visit (HOSPITAL_COMMUNITY): Payer: Self-pay | Admitting: Psychiatry

## 2017-08-17 DIAGNOSIS — R2689 Other abnormalities of gait and mobility: Secondary | ICD-10-CM | POA: Diagnosis not present

## 2017-08-17 DIAGNOSIS — G20 Parkinson's disease: Secondary | ICD-10-CM | POA: Diagnosis not present

## 2017-08-17 DIAGNOSIS — E1129 Type 2 diabetes mellitus with other diabetic kidney complication: Secondary | ICD-10-CM | POA: Diagnosis not present

## 2017-08-17 DIAGNOSIS — R634 Abnormal weight loss: Secondary | ICD-10-CM | POA: Diagnosis not present

## 2017-08-17 DIAGNOSIS — G219 Secondary parkinsonism, unspecified: Secondary | ICD-10-CM | POA: Diagnosis not present

## 2017-08-17 DIAGNOSIS — G3109 Other frontotemporal dementia: Secondary | ICD-10-CM | POA: Diagnosis not present

## 2017-08-17 DIAGNOSIS — F0281 Dementia in other diseases classified elsewhere with behavioral disturbance: Secondary | ICD-10-CM | POA: Diagnosis not present

## 2017-08-27 DIAGNOSIS — G20 Parkinson's disease: Secondary | ICD-10-CM | POA: Diagnosis not present

## 2017-08-27 DIAGNOSIS — Z6829 Body mass index (BMI) 29.0-29.9, adult: Secondary | ICD-10-CM | POA: Diagnosis not present

## 2017-08-27 DIAGNOSIS — G3109 Other frontotemporal dementia: Secondary | ICD-10-CM | POA: Diagnosis not present

## 2017-08-27 DIAGNOSIS — M4800 Spinal stenosis, site unspecified: Secondary | ICD-10-CM | POA: Diagnosis not present

## 2017-08-27 DIAGNOSIS — E1129 Type 2 diabetes mellitus with other diabetic kidney complication: Secondary | ICD-10-CM | POA: Diagnosis not present

## 2017-08-27 DIAGNOSIS — I10 Essential (primary) hypertension: Secondary | ICD-10-CM | POA: Diagnosis not present

## 2017-09-17 DIAGNOSIS — R634 Abnormal weight loss: Secondary | ICD-10-CM | POA: Diagnosis not present

## 2017-09-17 DIAGNOSIS — E1129 Type 2 diabetes mellitus with other diabetic kidney complication: Secondary | ICD-10-CM | POA: Diagnosis not present

## 2017-09-17 DIAGNOSIS — F0281 Dementia in other diseases classified elsewhere with behavioral disturbance: Secondary | ICD-10-CM | POA: Diagnosis not present

## 2017-09-17 DIAGNOSIS — G3109 Other frontotemporal dementia: Secondary | ICD-10-CM | POA: Diagnosis not present

## 2017-09-17 DIAGNOSIS — G219 Secondary parkinsonism, unspecified: Secondary | ICD-10-CM | POA: Diagnosis not present

## 2017-09-17 DIAGNOSIS — R2689 Other abnormalities of gait and mobility: Secondary | ICD-10-CM | POA: Diagnosis not present

## 2017-09-17 DIAGNOSIS — G20 Parkinson's disease: Secondary | ICD-10-CM | POA: Diagnosis not present

## 2017-10-01 DIAGNOSIS — Z961 Presence of intraocular lens: Secondary | ICD-10-CM | POA: Diagnosis not present

## 2017-10-01 DIAGNOSIS — H40023 Open angle with borderline findings, high risk, bilateral: Secondary | ICD-10-CM | POA: Diagnosis not present

## 2017-10-01 DIAGNOSIS — H35369 Drusen (degenerative) of macula, unspecified eye: Secondary | ICD-10-CM | POA: Diagnosis not present

## 2017-10-01 DIAGNOSIS — E119 Type 2 diabetes mellitus without complications: Secondary | ICD-10-CM | POA: Diagnosis not present

## 2017-10-03 ENCOUNTER — Ambulatory Visit (HOSPITAL_COMMUNITY): Payer: Medicare Other | Admitting: Psychiatry

## 2017-10-03 DIAGNOSIS — M545 Low back pain: Secondary | ICD-10-CM | POA: Diagnosis not present

## 2017-10-08 DIAGNOSIS — H4052X3 Glaucoma secondary to other eye disorders, left eye, severe stage: Secondary | ICD-10-CM | POA: Diagnosis not present

## 2017-10-08 DIAGNOSIS — H401114 Primary open-angle glaucoma, right eye, indeterminate stage: Secondary | ICD-10-CM | POA: Diagnosis not present

## 2017-10-10 DIAGNOSIS — R001 Bradycardia, unspecified: Secondary | ICD-10-CM | POA: Diagnosis not present

## 2017-10-10 DIAGNOSIS — E1129 Type 2 diabetes mellitus with other diabetic kidney complication: Secondary | ICD-10-CM | POA: Diagnosis not present

## 2017-10-10 DIAGNOSIS — N179 Acute kidney failure, unspecified: Secondary | ICD-10-CM | POA: Diagnosis not present

## 2017-10-10 DIAGNOSIS — F0281 Dementia in other diseases classified elsewhere with behavioral disturbance: Secondary | ICD-10-CM | POA: Diagnosis not present

## 2017-10-10 DIAGNOSIS — G3109 Other frontotemporal dementia: Secondary | ICD-10-CM | POA: Diagnosis not present

## 2017-10-10 DIAGNOSIS — F0391 Unspecified dementia with behavioral disturbance: Secondary | ICD-10-CM | POA: Diagnosis not present

## 2017-10-10 DIAGNOSIS — R634 Abnormal weight loss: Secondary | ICD-10-CM | POA: Diagnosis not present

## 2017-10-10 DIAGNOSIS — I10 Essential (primary) hypertension: Secondary | ICD-10-CM | POA: Diagnosis not present

## 2017-10-10 DIAGNOSIS — R2689 Other abnormalities of gait and mobility: Secondary | ICD-10-CM | POA: Diagnosis not present

## 2017-10-10 DIAGNOSIS — Z9989 Dependence on other enabling machines and devices: Secondary | ICD-10-CM | POA: Diagnosis not present

## 2017-10-10 DIAGNOSIS — E119 Type 2 diabetes mellitus without complications: Secondary | ICD-10-CM | POA: Diagnosis not present

## 2017-10-10 DIAGNOSIS — G4733 Obstructive sleep apnea (adult) (pediatric): Secondary | ICD-10-CM | POA: Diagnosis not present

## 2017-10-10 DIAGNOSIS — G20 Parkinson's disease: Secondary | ICD-10-CM | POA: Diagnosis not present

## 2017-10-10 DIAGNOSIS — R410 Disorientation, unspecified: Secondary | ICD-10-CM | POA: Diagnosis not present

## 2017-10-10 DIAGNOSIS — F0280 Dementia in other diseases classified elsewhere without behavioral disturbance: Secondary | ICD-10-CM | POA: Diagnosis not present

## 2017-10-10 DIAGNOSIS — R4182 Altered mental status, unspecified: Secondary | ICD-10-CM | POA: Diagnosis not present

## 2017-10-10 DIAGNOSIS — N289 Disorder of kidney and ureter, unspecified: Secondary | ICD-10-CM | POA: Diagnosis not present

## 2017-10-10 DIAGNOSIS — R55 Syncope and collapse: Secondary | ICD-10-CM | POA: Diagnosis not present

## 2017-10-10 DIAGNOSIS — J9690 Respiratory failure, unspecified, unspecified whether with hypoxia or hypercapnia: Secondary | ICD-10-CM | POA: Diagnosis not present

## 2017-10-10 DIAGNOSIS — R338 Other retention of urine: Secondary | ICD-10-CM | POA: Diagnosis not present

## 2017-10-10 DIAGNOSIS — E860 Dehydration: Secondary | ICD-10-CM | POA: Diagnosis not present

## 2017-10-10 DIAGNOSIS — R109 Unspecified abdominal pain: Secondary | ICD-10-CM | POA: Diagnosis not present

## 2017-10-10 DIAGNOSIS — G219 Secondary parkinsonism, unspecified: Secondary | ICD-10-CM | POA: Diagnosis not present

## 2017-10-10 DIAGNOSIS — J984 Other disorders of lung: Secondary | ICD-10-CM | POA: Diagnosis not present

## 2017-10-10 DIAGNOSIS — G40909 Epilepsy, unspecified, not intractable, without status epilepticus: Secondary | ICD-10-CM | POA: Diagnosis not present

## 2017-10-10 DIAGNOSIS — J181 Lobar pneumonia, unspecified organism: Secondary | ICD-10-CM | POA: Diagnosis not present

## 2017-10-10 DIAGNOSIS — S3991XA Unspecified injury of abdomen, initial encounter: Secondary | ICD-10-CM | POA: Diagnosis not present

## 2017-10-11 DIAGNOSIS — R001 Bradycardia, unspecified: Secondary | ICD-10-CM | POA: Diagnosis not present

## 2017-10-11 DIAGNOSIS — G4733 Obstructive sleep apnea (adult) (pediatric): Secondary | ICD-10-CM | POA: Diagnosis not present

## 2017-10-11 DIAGNOSIS — J181 Lobar pneumonia, unspecified organism: Secondary | ICD-10-CM | POA: Diagnosis not present

## 2017-10-11 DIAGNOSIS — G40909 Epilepsy, unspecified, not intractable, without status epilepticus: Secondary | ICD-10-CM | POA: Diagnosis not present

## 2017-10-11 DIAGNOSIS — R338 Other retention of urine: Secondary | ICD-10-CM | POA: Diagnosis not present

## 2017-10-11 DIAGNOSIS — I10 Essential (primary) hypertension: Secondary | ICD-10-CM | POA: Diagnosis not present

## 2017-10-11 DIAGNOSIS — E119 Type 2 diabetes mellitus without complications: Secondary | ICD-10-CM | POA: Diagnosis not present

## 2017-10-11 DIAGNOSIS — N179 Acute kidney failure, unspecified: Secondary | ICD-10-CM | POA: Diagnosis not present

## 2017-10-11 DIAGNOSIS — F0280 Dementia in other diseases classified elsewhere without behavioral disturbance: Secondary | ICD-10-CM | POA: Diagnosis not present

## 2017-10-11 DIAGNOSIS — G20 Parkinson's disease: Secondary | ICD-10-CM | POA: Diagnosis not present

## 2017-10-13 DIAGNOSIS — R55 Syncope and collapse: Secondary | ICD-10-CM | POA: Diagnosis not present

## 2017-10-17 DIAGNOSIS — G20 Parkinson's disease: Secondary | ICD-10-CM | POA: Diagnosis not present

## 2017-10-17 DIAGNOSIS — G4733 Obstructive sleep apnea (adult) (pediatric): Secondary | ICD-10-CM | POA: Diagnosis not present

## 2017-10-17 DIAGNOSIS — F0280 Dementia in other diseases classified elsewhere without behavioral disturbance: Secondary | ICD-10-CM | POA: Diagnosis not present

## 2017-10-17 DIAGNOSIS — G40909 Epilepsy, unspecified, not intractable, without status epilepticus: Secondary | ICD-10-CM | POA: Diagnosis not present

## 2017-10-17 DIAGNOSIS — E119 Type 2 diabetes mellitus without complications: Secondary | ICD-10-CM | POA: Diagnosis not present

## 2017-10-17 DIAGNOSIS — F0391 Unspecified dementia with behavioral disturbance: Secondary | ICD-10-CM | POA: Diagnosis not present

## 2017-10-17 DIAGNOSIS — N179 Acute kidney failure, unspecified: Secondary | ICD-10-CM | POA: Diagnosis not present

## 2017-10-17 DIAGNOSIS — J181 Lobar pneumonia, unspecified organism: Secondary | ICD-10-CM | POA: Diagnosis not present

## 2017-10-17 DIAGNOSIS — R001 Bradycardia, unspecified: Secondary | ICD-10-CM | POA: Diagnosis not present

## 2017-10-17 DIAGNOSIS — R338 Other retention of urine: Secondary | ICD-10-CM | POA: Diagnosis not present

## 2017-10-17 DIAGNOSIS — I10 Essential (primary) hypertension: Secondary | ICD-10-CM | POA: Diagnosis not present

## 2017-10-20 DIAGNOSIS — G20 Parkinson's disease: Secondary | ICD-10-CM | POA: Diagnosis not present

## 2017-10-20 DIAGNOSIS — R338 Other retention of urine: Secondary | ICD-10-CM | POA: Diagnosis not present

## 2017-10-20 DIAGNOSIS — F0281 Dementia in other diseases classified elsewhere with behavioral disturbance: Secondary | ICD-10-CM | POA: Diagnosis not present

## 2017-10-20 DIAGNOSIS — F319 Bipolar disorder, unspecified: Secondary | ICD-10-CM | POA: Diagnosis not present

## 2017-10-20 DIAGNOSIS — N189 Chronic kidney disease, unspecified: Secondary | ICD-10-CM | POA: Diagnosis not present

## 2017-10-20 DIAGNOSIS — N401 Enlarged prostate with lower urinary tract symptoms: Secondary | ICD-10-CM | POA: Diagnosis not present

## 2017-10-20 DIAGNOSIS — J181 Lobar pneumonia, unspecified organism: Secondary | ICD-10-CM | POA: Diagnosis not present

## 2017-10-20 DIAGNOSIS — I129 Hypertensive chronic kidney disease with stage 1 through stage 4 chronic kidney disease, or unspecified chronic kidney disease: Secondary | ICD-10-CM | POA: Diagnosis not present

## 2017-10-20 DIAGNOSIS — E1122 Type 2 diabetes mellitus with diabetic chronic kidney disease: Secondary | ICD-10-CM | POA: Diagnosis not present

## 2017-10-21 DIAGNOSIS — F0281 Dementia in other diseases classified elsewhere with behavioral disturbance: Secondary | ICD-10-CM | POA: Diagnosis not present

## 2017-10-21 DIAGNOSIS — J181 Lobar pneumonia, unspecified organism: Secondary | ICD-10-CM | POA: Diagnosis not present

## 2017-10-21 DIAGNOSIS — E1122 Type 2 diabetes mellitus with diabetic chronic kidney disease: Secondary | ICD-10-CM | POA: Diagnosis not present

## 2017-10-21 DIAGNOSIS — I129 Hypertensive chronic kidney disease with stage 1 through stage 4 chronic kidney disease, or unspecified chronic kidney disease: Secondary | ICD-10-CM | POA: Diagnosis not present

## 2017-10-21 DIAGNOSIS — F319 Bipolar disorder, unspecified: Secondary | ICD-10-CM | POA: Diagnosis not present

## 2017-10-21 DIAGNOSIS — N401 Enlarged prostate with lower urinary tract symptoms: Secondary | ICD-10-CM | POA: Diagnosis not present

## 2017-10-21 DIAGNOSIS — N189 Chronic kidney disease, unspecified: Secondary | ICD-10-CM | POA: Diagnosis not present

## 2017-10-21 DIAGNOSIS — R338 Other retention of urine: Secondary | ICD-10-CM | POA: Diagnosis not present

## 2017-10-21 DIAGNOSIS — G20 Parkinson's disease: Secondary | ICD-10-CM | POA: Diagnosis not present

## 2017-10-22 DIAGNOSIS — G20 Parkinson's disease: Secondary | ICD-10-CM | POA: Diagnosis not present

## 2017-10-22 DIAGNOSIS — E1122 Type 2 diabetes mellitus with diabetic chronic kidney disease: Secondary | ICD-10-CM | POA: Diagnosis not present

## 2017-10-22 DIAGNOSIS — N3020 Other chronic cystitis without hematuria: Secondary | ICD-10-CM | POA: Diagnosis not present

## 2017-10-22 DIAGNOSIS — R338 Other retention of urine: Secondary | ICD-10-CM | POA: Diagnosis not present

## 2017-10-22 DIAGNOSIS — F0281 Dementia in other diseases classified elsewhere with behavioral disturbance: Secondary | ICD-10-CM | POA: Diagnosis not present

## 2017-10-22 DIAGNOSIS — R351 Nocturia: Secondary | ICD-10-CM | POA: Diagnosis not present

## 2017-10-22 DIAGNOSIS — F319 Bipolar disorder, unspecified: Secondary | ICD-10-CM | POA: Diagnosis not present

## 2017-10-22 DIAGNOSIS — N401 Enlarged prostate with lower urinary tract symptoms: Secondary | ICD-10-CM | POA: Diagnosis not present

## 2017-10-22 DIAGNOSIS — I129 Hypertensive chronic kidney disease with stage 1 through stage 4 chronic kidney disease, or unspecified chronic kidney disease: Secondary | ICD-10-CM | POA: Diagnosis not present

## 2017-10-22 DIAGNOSIS — J181 Lobar pneumonia, unspecified organism: Secondary | ICD-10-CM | POA: Diagnosis not present

## 2017-10-22 DIAGNOSIS — N189 Chronic kidney disease, unspecified: Secondary | ICD-10-CM | POA: Diagnosis not present

## 2017-10-22 DIAGNOSIS — R3912 Poor urinary stream: Secondary | ICD-10-CM | POA: Diagnosis not present

## 2017-10-23 ENCOUNTER — Other Ambulatory Visit: Payer: Self-pay

## 2017-10-23 DIAGNOSIS — G20 Parkinson's disease: Secondary | ICD-10-CM | POA: Diagnosis not present

## 2017-10-23 DIAGNOSIS — F0281 Dementia in other diseases classified elsewhere with behavioral disturbance: Secondary | ICD-10-CM | POA: Diagnosis not present

## 2017-10-23 DIAGNOSIS — N189 Chronic kidney disease, unspecified: Secondary | ICD-10-CM | POA: Diagnosis not present

## 2017-10-23 DIAGNOSIS — I129 Hypertensive chronic kidney disease with stage 1 through stage 4 chronic kidney disease, or unspecified chronic kidney disease: Secondary | ICD-10-CM | POA: Diagnosis not present

## 2017-10-23 DIAGNOSIS — E1122 Type 2 diabetes mellitus with diabetic chronic kidney disease: Secondary | ICD-10-CM | POA: Diagnosis not present

## 2017-10-23 DIAGNOSIS — N401 Enlarged prostate with lower urinary tract symptoms: Secondary | ICD-10-CM | POA: Diagnosis not present

## 2017-10-23 DIAGNOSIS — J181 Lobar pneumonia, unspecified organism: Secondary | ICD-10-CM | POA: Diagnosis not present

## 2017-10-23 DIAGNOSIS — R338 Other retention of urine: Secondary | ICD-10-CM | POA: Diagnosis not present

## 2017-10-23 DIAGNOSIS — F319 Bipolar disorder, unspecified: Secondary | ICD-10-CM | POA: Diagnosis not present

## 2017-10-23 NOTE — Patient Outreach (Signed)
[facility]) Care Management  10/23/2017  [name] Oct 03, 1940 [id number]     Transition of Care Referral  Referral Date: 10/23/17 Referral Source: [facility] Discharge Report Date of Admission: unknown Diagnosis:unknown Date of Discharge: 10/19/17 Facility: [facility]    Outreach attempt # 1 to patient. Spoke with [name] POA-Loretta. She immediately became upset when RN CM discussed nature of call and that [name] is contracted by [facility] to follow up with patients post discharge. She voices that she is "upset" with [employer] as they denied MD recommendations for SNF. She voices that [name] RN was out yesterday and confirmed that patient should not have been sent home but sent to a facility. She voices that she has "people working on it" to get the patient in the facility. She inquired as to why [employer] denied placement. RN CM advised caregiver that RN CM does not have access to that information. Encouraged her to call [facility] customer service to get further info. She then voiced that there was nothing that RN CM could do at this time and hung up the phone.   Plan: RN CM will notify [name] administrative assistant of case status.    Enzo Montgomery, RN,BSN,CCM [facility] Telephonic Care Management Coordinator Direct Phone: [phone] Toll Free: [phone] Fax: [phone]

## 2017-10-24 DIAGNOSIS — M6281 Muscle weakness (generalized): Secondary | ICD-10-CM | POA: Diagnosis not present

## 2017-10-24 DIAGNOSIS — J181 Lobar pneumonia, unspecified organism: Secondary | ICD-10-CM | POA: Diagnosis not present

## 2017-10-24 DIAGNOSIS — R001 Bradycardia, unspecified: Secondary | ICD-10-CM | POA: Diagnosis not present

## 2017-10-24 DIAGNOSIS — H401114 Primary open-angle glaucoma, right eye, indeterminate stage: Secondary | ICD-10-CM | POA: Diagnosis not present

## 2017-10-24 DIAGNOSIS — N179 Acute kidney failure, unspecified: Secondary | ICD-10-CM | POA: Diagnosis not present

## 2017-10-24 DIAGNOSIS — F29 Unspecified psychosis not due to a substance or known physiological condition: Secondary | ICD-10-CM | POA: Diagnosis not present

## 2017-10-24 DIAGNOSIS — R339 Retention of urine, unspecified: Secondary | ICD-10-CM | POA: Diagnosis not present

## 2017-10-25 DIAGNOSIS — F0281 Dementia in other diseases classified elsewhere with behavioral disturbance: Secondary | ICD-10-CM | POA: Diagnosis not present

## 2017-10-25 DIAGNOSIS — G20 Parkinson's disease: Secondary | ICD-10-CM | POA: Diagnosis not present

## 2017-10-25 DIAGNOSIS — J181 Lobar pneumonia, unspecified organism: Secondary | ICD-10-CM | POA: Diagnosis not present

## 2017-10-25 DIAGNOSIS — N189 Chronic kidney disease, unspecified: Secondary | ICD-10-CM | POA: Diagnosis not present

## 2017-10-25 DIAGNOSIS — N3020 Other chronic cystitis without hematuria: Secondary | ICD-10-CM | POA: Diagnosis not present

## 2017-10-25 DIAGNOSIS — R3912 Poor urinary stream: Secondary | ICD-10-CM | POA: Diagnosis not present

## 2017-10-25 DIAGNOSIS — E1122 Type 2 diabetes mellitus with diabetic chronic kidney disease: Secondary | ICD-10-CM | POA: Diagnosis not present

## 2017-10-25 DIAGNOSIS — I129 Hypertensive chronic kidney disease with stage 1 through stage 4 chronic kidney disease, or unspecified chronic kidney disease: Secondary | ICD-10-CM | POA: Diagnosis not present

## 2017-10-25 DIAGNOSIS — R338 Other retention of urine: Secondary | ICD-10-CM | POA: Diagnosis not present

## 2017-10-25 DIAGNOSIS — F319 Bipolar disorder, unspecified: Secondary | ICD-10-CM | POA: Diagnosis not present

## 2017-10-25 DIAGNOSIS — N401 Enlarged prostate with lower urinary tract symptoms: Secondary | ICD-10-CM | POA: Diagnosis not present

## 2017-10-26 DIAGNOSIS — N401 Enlarged prostate with lower urinary tract symptoms: Secondary | ICD-10-CM | POA: Diagnosis not present

## 2017-10-26 DIAGNOSIS — F319 Bipolar disorder, unspecified: Secondary | ICD-10-CM | POA: Diagnosis not present

## 2017-10-26 DIAGNOSIS — E1122 Type 2 diabetes mellitus with diabetic chronic kidney disease: Secondary | ICD-10-CM | POA: Diagnosis not present

## 2017-10-26 DIAGNOSIS — I129 Hypertensive chronic kidney disease with stage 1 through stage 4 chronic kidney disease, or unspecified chronic kidney disease: Secondary | ICD-10-CM | POA: Diagnosis not present

## 2017-10-26 DIAGNOSIS — R338 Other retention of urine: Secondary | ICD-10-CM | POA: Diagnosis not present

## 2017-10-26 DIAGNOSIS — G20 Parkinson's disease: Secondary | ICD-10-CM | POA: Diagnosis not present

## 2017-10-26 DIAGNOSIS — N189 Chronic kidney disease, unspecified: Secondary | ICD-10-CM | POA: Diagnosis not present

## 2017-10-26 DIAGNOSIS — J181 Lobar pneumonia, unspecified organism: Secondary | ICD-10-CM | POA: Diagnosis not present

## 2017-10-26 DIAGNOSIS — F0281 Dementia in other diseases classified elsewhere with behavioral disturbance: Secondary | ICD-10-CM | POA: Diagnosis not present

## 2017-10-29 DIAGNOSIS — N401 Enlarged prostate with lower urinary tract symptoms: Secondary | ICD-10-CM | POA: Diagnosis not present

## 2017-10-29 DIAGNOSIS — N189 Chronic kidney disease, unspecified: Secondary | ICD-10-CM | POA: Diagnosis not present

## 2017-10-29 DIAGNOSIS — J181 Lobar pneumonia, unspecified organism: Secondary | ICD-10-CM | POA: Diagnosis not present

## 2017-10-29 DIAGNOSIS — F319 Bipolar disorder, unspecified: Secondary | ICD-10-CM | POA: Diagnosis not present

## 2017-10-29 DIAGNOSIS — E1122 Type 2 diabetes mellitus with diabetic chronic kidney disease: Secondary | ICD-10-CM | POA: Diagnosis not present

## 2017-10-29 DIAGNOSIS — R338 Other retention of urine: Secondary | ICD-10-CM | POA: Diagnosis not present

## 2017-10-29 DIAGNOSIS — F0281 Dementia in other diseases classified elsewhere with behavioral disturbance: Secondary | ICD-10-CM | POA: Diagnosis not present

## 2017-10-29 DIAGNOSIS — I129 Hypertensive chronic kidney disease with stage 1 through stage 4 chronic kidney disease, or unspecified chronic kidney disease: Secondary | ICD-10-CM | POA: Diagnosis not present

## 2017-10-29 DIAGNOSIS — G20 Parkinson's disease: Secondary | ICD-10-CM | POA: Diagnosis not present

## 2017-10-30 DIAGNOSIS — R338 Other retention of urine: Secondary | ICD-10-CM | POA: Diagnosis not present

## 2017-10-30 DIAGNOSIS — E1122 Type 2 diabetes mellitus with diabetic chronic kidney disease: Secondary | ICD-10-CM | POA: Diagnosis not present

## 2017-10-30 DIAGNOSIS — G20 Parkinson's disease: Secondary | ICD-10-CM | POA: Diagnosis not present

## 2017-10-30 DIAGNOSIS — N189 Chronic kidney disease, unspecified: Secondary | ICD-10-CM | POA: Diagnosis not present

## 2017-10-30 DIAGNOSIS — N401 Enlarged prostate with lower urinary tract symptoms: Secondary | ICD-10-CM | POA: Diagnosis not present

## 2017-10-30 DIAGNOSIS — I129 Hypertensive chronic kidney disease with stage 1 through stage 4 chronic kidney disease, or unspecified chronic kidney disease: Secondary | ICD-10-CM | POA: Diagnosis not present

## 2017-10-30 DIAGNOSIS — J181 Lobar pneumonia, unspecified organism: Secondary | ICD-10-CM | POA: Diagnosis not present

## 2017-10-30 DIAGNOSIS — F319 Bipolar disorder, unspecified: Secondary | ICD-10-CM | POA: Diagnosis not present

## 2017-10-30 DIAGNOSIS — F0281 Dementia in other diseases classified elsewhere with behavioral disturbance: Secondary | ICD-10-CM | POA: Diagnosis not present

## 2017-11-01 DIAGNOSIS — H4052X3 Glaucoma secondary to other eye disorders, left eye, severe stage: Secondary | ICD-10-CM | POA: Diagnosis not present

## 2017-11-02 DIAGNOSIS — E1122 Type 2 diabetes mellitus with diabetic chronic kidney disease: Secondary | ICD-10-CM | POA: Diagnosis not present

## 2017-11-02 DIAGNOSIS — M5416 Radiculopathy, lumbar region: Secondary | ICD-10-CM | POA: Diagnosis not present

## 2017-11-02 DIAGNOSIS — N401 Enlarged prostate with lower urinary tract symptoms: Secondary | ICD-10-CM | POA: Diagnosis not present

## 2017-11-02 DIAGNOSIS — J181 Lobar pneumonia, unspecified organism: Secondary | ICD-10-CM | POA: Diagnosis not present

## 2017-11-02 DIAGNOSIS — F0281 Dementia in other diseases classified elsewhere with behavioral disturbance: Secondary | ICD-10-CM | POA: Diagnosis not present

## 2017-11-02 DIAGNOSIS — M545 Low back pain: Secondary | ICD-10-CM | POA: Diagnosis not present

## 2017-11-02 DIAGNOSIS — F319 Bipolar disorder, unspecified: Secondary | ICD-10-CM | POA: Diagnosis not present

## 2017-11-02 DIAGNOSIS — R338 Other retention of urine: Secondary | ICD-10-CM | POA: Diagnosis not present

## 2017-11-02 DIAGNOSIS — G20 Parkinson's disease: Secondary | ICD-10-CM | POA: Diagnosis not present

## 2017-11-02 DIAGNOSIS — N189 Chronic kidney disease, unspecified: Secondary | ICD-10-CM | POA: Diagnosis not present

## 2017-11-02 DIAGNOSIS — I129 Hypertensive chronic kidney disease with stage 1 through stage 4 chronic kidney disease, or unspecified chronic kidney disease: Secondary | ICD-10-CM | POA: Diagnosis not present

## 2017-11-05 DIAGNOSIS — N189 Chronic kidney disease, unspecified: Secondary | ICD-10-CM | POA: Diagnosis not present

## 2017-11-05 DIAGNOSIS — J181 Lobar pneumonia, unspecified organism: Secondary | ICD-10-CM | POA: Diagnosis not present

## 2017-11-05 DIAGNOSIS — G20 Parkinson's disease: Secondary | ICD-10-CM | POA: Diagnosis not present

## 2017-11-05 DIAGNOSIS — F0281 Dementia in other diseases classified elsewhere with behavioral disturbance: Secondary | ICD-10-CM | POA: Diagnosis not present

## 2017-11-05 DIAGNOSIS — F319 Bipolar disorder, unspecified: Secondary | ICD-10-CM | POA: Diagnosis not present

## 2017-11-05 DIAGNOSIS — I129 Hypertensive chronic kidney disease with stage 1 through stage 4 chronic kidney disease, or unspecified chronic kidney disease: Secondary | ICD-10-CM | POA: Diagnosis not present

## 2017-11-05 DIAGNOSIS — E1122 Type 2 diabetes mellitus with diabetic chronic kidney disease: Secondary | ICD-10-CM | POA: Diagnosis not present

## 2017-11-05 DIAGNOSIS — N401 Enlarged prostate with lower urinary tract symptoms: Secondary | ICD-10-CM | POA: Diagnosis not present

## 2017-11-05 DIAGNOSIS — R338 Other retention of urine: Secondary | ICD-10-CM | POA: Diagnosis not present

## 2017-11-06 DIAGNOSIS — I129 Hypertensive chronic kidney disease with stage 1 through stage 4 chronic kidney disease, or unspecified chronic kidney disease: Secondary | ICD-10-CM | POA: Diagnosis not present

## 2017-11-06 DIAGNOSIS — F0281 Dementia in other diseases classified elsewhere with behavioral disturbance: Secondary | ICD-10-CM | POA: Diagnosis not present

## 2017-11-06 DIAGNOSIS — N401 Enlarged prostate with lower urinary tract symptoms: Secondary | ICD-10-CM | POA: Diagnosis not present

## 2017-11-06 DIAGNOSIS — E1122 Type 2 diabetes mellitus with diabetic chronic kidney disease: Secondary | ICD-10-CM | POA: Diagnosis not present

## 2017-11-06 DIAGNOSIS — R338 Other retention of urine: Secondary | ICD-10-CM | POA: Diagnosis not present

## 2017-11-06 DIAGNOSIS — N189 Chronic kidney disease, unspecified: Secondary | ICD-10-CM | POA: Diagnosis not present

## 2017-11-06 DIAGNOSIS — G20 Parkinson's disease: Secondary | ICD-10-CM | POA: Diagnosis not present

## 2017-11-06 DIAGNOSIS — F319 Bipolar disorder, unspecified: Secondary | ICD-10-CM | POA: Diagnosis not present

## 2017-11-06 DIAGNOSIS — J181 Lobar pneumonia, unspecified organism: Secondary | ICD-10-CM | POA: Diagnosis not present

## 2017-11-07 DIAGNOSIS — I129 Hypertensive chronic kidney disease with stage 1 through stage 4 chronic kidney disease, or unspecified chronic kidney disease: Secondary | ICD-10-CM | POA: Diagnosis not present

## 2017-11-07 DIAGNOSIS — F0281 Dementia in other diseases classified elsewhere with behavioral disturbance: Secondary | ICD-10-CM | POA: Diagnosis not present

## 2017-11-07 DIAGNOSIS — G20 Parkinson's disease: Secondary | ICD-10-CM | POA: Diagnosis not present

## 2017-11-07 DIAGNOSIS — N401 Enlarged prostate with lower urinary tract symptoms: Secondary | ICD-10-CM | POA: Diagnosis not present

## 2017-11-07 DIAGNOSIS — F319 Bipolar disorder, unspecified: Secondary | ICD-10-CM | POA: Diagnosis not present

## 2017-11-07 DIAGNOSIS — R338 Other retention of urine: Secondary | ICD-10-CM | POA: Diagnosis not present

## 2017-11-07 DIAGNOSIS — N189 Chronic kidney disease, unspecified: Secondary | ICD-10-CM | POA: Diagnosis not present

## 2017-11-07 DIAGNOSIS — J181 Lobar pneumonia, unspecified organism: Secondary | ICD-10-CM | POA: Diagnosis not present

## 2017-11-07 DIAGNOSIS — E1122 Type 2 diabetes mellitus with diabetic chronic kidney disease: Secondary | ICD-10-CM | POA: Diagnosis not present

## 2017-11-08 DIAGNOSIS — R338 Other retention of urine: Secondary | ICD-10-CM | POA: Diagnosis not present

## 2017-11-08 DIAGNOSIS — N189 Chronic kidney disease, unspecified: Secondary | ICD-10-CM | POA: Diagnosis not present

## 2017-11-08 DIAGNOSIS — I129 Hypertensive chronic kidney disease with stage 1 through stage 4 chronic kidney disease, or unspecified chronic kidney disease: Secondary | ICD-10-CM | POA: Diagnosis not present

## 2017-11-08 DIAGNOSIS — E1122 Type 2 diabetes mellitus with diabetic chronic kidney disease: Secondary | ICD-10-CM | POA: Diagnosis not present

## 2017-11-08 DIAGNOSIS — F0281 Dementia in other diseases classified elsewhere with behavioral disturbance: Secondary | ICD-10-CM | POA: Diagnosis not present

## 2017-11-08 DIAGNOSIS — N401 Enlarged prostate with lower urinary tract symptoms: Secondary | ICD-10-CM | POA: Diagnosis not present

## 2017-11-08 DIAGNOSIS — F319 Bipolar disorder, unspecified: Secondary | ICD-10-CM | POA: Diagnosis not present

## 2017-11-08 DIAGNOSIS — J181 Lobar pneumonia, unspecified organism: Secondary | ICD-10-CM | POA: Diagnosis not present

## 2017-11-08 DIAGNOSIS — G20 Parkinson's disease: Secondary | ICD-10-CM | POA: Diagnosis not present

## 2017-11-08 DIAGNOSIS — M545 Low back pain: Secondary | ICD-10-CM | POA: Diagnosis not present

## 2017-11-09 DIAGNOSIS — G20 Parkinson's disease: Secondary | ICD-10-CM | POA: Diagnosis not present

## 2017-11-09 DIAGNOSIS — N189 Chronic kidney disease, unspecified: Secondary | ICD-10-CM | POA: Diagnosis not present

## 2017-11-09 DIAGNOSIS — R338 Other retention of urine: Secondary | ICD-10-CM | POA: Diagnosis not present

## 2017-11-09 DIAGNOSIS — F319 Bipolar disorder, unspecified: Secondary | ICD-10-CM | POA: Diagnosis not present

## 2017-11-09 DIAGNOSIS — F0281 Dementia in other diseases classified elsewhere with behavioral disturbance: Secondary | ICD-10-CM | POA: Diagnosis not present

## 2017-11-09 DIAGNOSIS — J181 Lobar pneumonia, unspecified organism: Secondary | ICD-10-CM | POA: Diagnosis not present

## 2017-11-09 DIAGNOSIS — I129 Hypertensive chronic kidney disease with stage 1 through stage 4 chronic kidney disease, or unspecified chronic kidney disease: Secondary | ICD-10-CM | POA: Diagnosis not present

## 2017-11-09 DIAGNOSIS — N401 Enlarged prostate with lower urinary tract symptoms: Secondary | ICD-10-CM | POA: Diagnosis not present

## 2017-11-09 DIAGNOSIS — E1122 Type 2 diabetes mellitus with diabetic chronic kidney disease: Secondary | ICD-10-CM | POA: Diagnosis not present

## 2017-11-13 DIAGNOSIS — G20 Parkinson's disease: Secondary | ICD-10-CM | POA: Diagnosis not present

## 2017-11-13 DIAGNOSIS — I129 Hypertensive chronic kidney disease with stage 1 through stage 4 chronic kidney disease, or unspecified chronic kidney disease: Secondary | ICD-10-CM | POA: Diagnosis not present

## 2017-11-13 DIAGNOSIS — R338 Other retention of urine: Secondary | ICD-10-CM | POA: Diagnosis not present

## 2017-11-13 DIAGNOSIS — N189 Chronic kidney disease, unspecified: Secondary | ICD-10-CM | POA: Diagnosis not present

## 2017-11-13 DIAGNOSIS — E1122 Type 2 diabetes mellitus with diabetic chronic kidney disease: Secondary | ICD-10-CM | POA: Diagnosis not present

## 2017-11-13 DIAGNOSIS — N401 Enlarged prostate with lower urinary tract symptoms: Secondary | ICD-10-CM | POA: Diagnosis not present

## 2017-11-13 DIAGNOSIS — F319 Bipolar disorder, unspecified: Secondary | ICD-10-CM | POA: Diagnosis not present

## 2017-11-13 DIAGNOSIS — J181 Lobar pneumonia, unspecified organism: Secondary | ICD-10-CM | POA: Diagnosis not present

## 2017-11-13 DIAGNOSIS — F0281 Dementia in other diseases classified elsewhere with behavioral disturbance: Secondary | ICD-10-CM | POA: Diagnosis not present

## 2017-11-14 ENCOUNTER — Ambulatory Visit (HOSPITAL_COMMUNITY): Payer: Self-pay | Admitting: Psychiatry

## 2017-11-14 DIAGNOSIS — J181 Lobar pneumonia, unspecified organism: Secondary | ICD-10-CM | POA: Diagnosis not present

## 2017-11-14 DIAGNOSIS — E1122 Type 2 diabetes mellitus with diabetic chronic kidney disease: Secondary | ICD-10-CM | POA: Diagnosis not present

## 2017-11-14 DIAGNOSIS — G20 Parkinson's disease: Secondary | ICD-10-CM | POA: Diagnosis not present

## 2017-11-14 DIAGNOSIS — F319 Bipolar disorder, unspecified: Secondary | ICD-10-CM | POA: Diagnosis not present

## 2017-11-14 DIAGNOSIS — N401 Enlarged prostate with lower urinary tract symptoms: Secondary | ICD-10-CM | POA: Diagnosis not present

## 2017-11-14 DIAGNOSIS — I129 Hypertensive chronic kidney disease with stage 1 through stage 4 chronic kidney disease, or unspecified chronic kidney disease: Secondary | ICD-10-CM | POA: Diagnosis not present

## 2017-11-14 DIAGNOSIS — R338 Other retention of urine: Secondary | ICD-10-CM | POA: Diagnosis not present

## 2017-11-14 DIAGNOSIS — F0281 Dementia in other diseases classified elsewhere with behavioral disturbance: Secondary | ICD-10-CM | POA: Diagnosis not present

## 2017-11-14 DIAGNOSIS — N189 Chronic kidney disease, unspecified: Secondary | ICD-10-CM | POA: Diagnosis not present

## 2017-11-15 DIAGNOSIS — J181 Lobar pneumonia, unspecified organism: Secondary | ICD-10-CM | POA: Diagnosis not present

## 2017-11-15 DIAGNOSIS — F0281 Dementia in other diseases classified elsewhere with behavioral disturbance: Secondary | ICD-10-CM | POA: Diagnosis not present

## 2017-11-15 DIAGNOSIS — R339 Retention of urine, unspecified: Secondary | ICD-10-CM | POA: Diagnosis not present

## 2017-11-15 DIAGNOSIS — R001 Bradycardia, unspecified: Secondary | ICD-10-CM | POA: Diagnosis not present

## 2017-11-15 DIAGNOSIS — M6281 Muscle weakness (generalized): Secondary | ICD-10-CM | POA: Diagnosis not present

## 2017-11-15 DIAGNOSIS — N179 Acute kidney failure, unspecified: Secondary | ICD-10-CM | POA: Diagnosis not present

## 2017-11-15 DIAGNOSIS — R2689 Other abnormalities of gait and mobility: Secondary | ICD-10-CM | POA: Diagnosis not present

## 2017-11-15 DIAGNOSIS — R634 Abnormal weight loss: Secondary | ICD-10-CM | POA: Diagnosis not present

## 2017-11-15 DIAGNOSIS — G219 Secondary parkinsonism, unspecified: Secondary | ICD-10-CM | POA: Diagnosis not present

## 2017-11-15 DIAGNOSIS — F29 Unspecified psychosis not due to a substance or known physiological condition: Secondary | ICD-10-CM | POA: Diagnosis not present

## 2017-11-15 DIAGNOSIS — G20 Parkinson's disease: Secondary | ICD-10-CM | POA: Diagnosis not present

## 2017-11-15 DIAGNOSIS — G3109 Other frontotemporal dementia: Secondary | ICD-10-CM | POA: Diagnosis not present

## 2017-11-15 DIAGNOSIS — E1129 Type 2 diabetes mellitus with other diabetic kidney complication: Secondary | ICD-10-CM | POA: Diagnosis not present

## 2017-11-16 DIAGNOSIS — N189 Chronic kidney disease, unspecified: Secondary | ICD-10-CM | POA: Diagnosis not present

## 2017-11-16 DIAGNOSIS — F0281 Dementia in other diseases classified elsewhere with behavioral disturbance: Secondary | ICD-10-CM | POA: Diagnosis not present

## 2017-11-16 DIAGNOSIS — I129 Hypertensive chronic kidney disease with stage 1 through stage 4 chronic kidney disease, or unspecified chronic kidney disease: Secondary | ICD-10-CM | POA: Diagnosis not present

## 2017-11-16 DIAGNOSIS — N401 Enlarged prostate with lower urinary tract symptoms: Secondary | ICD-10-CM | POA: Diagnosis not present

## 2017-11-16 DIAGNOSIS — G20 Parkinson's disease: Secondary | ICD-10-CM | POA: Diagnosis not present

## 2017-11-16 DIAGNOSIS — E1122 Type 2 diabetes mellitus with diabetic chronic kidney disease: Secondary | ICD-10-CM | POA: Diagnosis not present

## 2017-11-16 DIAGNOSIS — F319 Bipolar disorder, unspecified: Secondary | ICD-10-CM | POA: Diagnosis not present

## 2017-11-16 DIAGNOSIS — J181 Lobar pneumonia, unspecified organism: Secondary | ICD-10-CM | POA: Diagnosis not present

## 2017-11-16 DIAGNOSIS — R338 Other retention of urine: Secondary | ICD-10-CM | POA: Diagnosis not present

## 2017-11-19 DIAGNOSIS — G20 Parkinson's disease: Secondary | ICD-10-CM | POA: Diagnosis not present

## 2017-11-19 DIAGNOSIS — R338 Other retention of urine: Secondary | ICD-10-CM | POA: Diagnosis not present

## 2017-11-19 DIAGNOSIS — F0281 Dementia in other diseases classified elsewhere with behavioral disturbance: Secondary | ICD-10-CM | POA: Diagnosis not present

## 2017-11-19 DIAGNOSIS — J181 Lobar pneumonia, unspecified organism: Secondary | ICD-10-CM | POA: Diagnosis not present

## 2017-11-19 DIAGNOSIS — I129 Hypertensive chronic kidney disease with stage 1 through stage 4 chronic kidney disease, or unspecified chronic kidney disease: Secondary | ICD-10-CM | POA: Diagnosis not present

## 2017-11-19 DIAGNOSIS — N189 Chronic kidney disease, unspecified: Secondary | ICD-10-CM | POA: Diagnosis not present

## 2017-11-19 DIAGNOSIS — E1122 Type 2 diabetes mellitus with diabetic chronic kidney disease: Secondary | ICD-10-CM | POA: Diagnosis not present

## 2017-11-19 DIAGNOSIS — N401 Enlarged prostate with lower urinary tract symptoms: Secondary | ICD-10-CM | POA: Diagnosis not present

## 2017-11-19 DIAGNOSIS — F319 Bipolar disorder, unspecified: Secondary | ICD-10-CM | POA: Diagnosis not present

## 2017-11-23 DIAGNOSIS — G20 Parkinson's disease: Secondary | ICD-10-CM | POA: Diagnosis not present

## 2017-11-23 DIAGNOSIS — N189 Chronic kidney disease, unspecified: Secondary | ICD-10-CM | POA: Diagnosis not present

## 2017-11-23 DIAGNOSIS — J181 Lobar pneumonia, unspecified organism: Secondary | ICD-10-CM | POA: Diagnosis not present

## 2017-11-23 DIAGNOSIS — N401 Enlarged prostate with lower urinary tract symptoms: Secondary | ICD-10-CM | POA: Diagnosis not present

## 2017-11-23 DIAGNOSIS — I129 Hypertensive chronic kidney disease with stage 1 through stage 4 chronic kidney disease, or unspecified chronic kidney disease: Secondary | ICD-10-CM | POA: Diagnosis not present

## 2017-11-23 DIAGNOSIS — F0281 Dementia in other diseases classified elsewhere with behavioral disturbance: Secondary | ICD-10-CM | POA: Diagnosis not present

## 2017-11-23 DIAGNOSIS — R338 Other retention of urine: Secondary | ICD-10-CM | POA: Diagnosis not present

## 2017-11-23 DIAGNOSIS — F319 Bipolar disorder, unspecified: Secondary | ICD-10-CM | POA: Diagnosis not present

## 2017-11-23 DIAGNOSIS — E1122 Type 2 diabetes mellitus with diabetic chronic kidney disease: Secondary | ICD-10-CM | POA: Diagnosis not present

## 2017-11-26 DIAGNOSIS — M961 Postlaminectomy syndrome, not elsewhere classified: Secondary | ICD-10-CM | POA: Diagnosis not present

## 2017-11-26 DIAGNOSIS — G20 Parkinson's disease: Secondary | ICD-10-CM | POA: Diagnosis not present

## 2017-11-26 DIAGNOSIS — G894 Chronic pain syndrome: Secondary | ICD-10-CM | POA: Diagnosis not present

## 2017-11-26 DIAGNOSIS — M62561 Muscle wasting and atrophy, not elsewhere classified, right lower leg: Secondary | ICD-10-CM | POA: Diagnosis not present

## 2017-11-26 DIAGNOSIS — M47816 Spondylosis without myelopathy or radiculopathy, lumbar region: Secondary | ICD-10-CM | POA: Diagnosis not present

## 2017-11-26 DIAGNOSIS — F329 Major depressive disorder, single episode, unspecified: Secondary | ICD-10-CM | POA: Diagnosis not present

## 2017-11-26 DIAGNOSIS — E119 Type 2 diabetes mellitus without complications: Secondary | ICD-10-CM | POA: Diagnosis not present

## 2017-11-27 DIAGNOSIS — I129 Hypertensive chronic kidney disease with stage 1 through stage 4 chronic kidney disease, or unspecified chronic kidney disease: Secondary | ICD-10-CM | POA: Diagnosis not present

## 2017-11-27 DIAGNOSIS — N189 Chronic kidney disease, unspecified: Secondary | ICD-10-CM | POA: Diagnosis not present

## 2017-11-27 DIAGNOSIS — J181 Lobar pneumonia, unspecified organism: Secondary | ICD-10-CM | POA: Diagnosis not present

## 2017-11-27 DIAGNOSIS — N401 Enlarged prostate with lower urinary tract symptoms: Secondary | ICD-10-CM | POA: Diagnosis not present

## 2017-11-27 DIAGNOSIS — E1122 Type 2 diabetes mellitus with diabetic chronic kidney disease: Secondary | ICD-10-CM | POA: Diagnosis not present

## 2017-11-27 DIAGNOSIS — F319 Bipolar disorder, unspecified: Secondary | ICD-10-CM | POA: Diagnosis not present

## 2017-11-27 DIAGNOSIS — G20 Parkinson's disease: Secondary | ICD-10-CM | POA: Diagnosis not present

## 2017-11-27 DIAGNOSIS — R338 Other retention of urine: Secondary | ICD-10-CM | POA: Diagnosis not present

## 2017-11-27 DIAGNOSIS — F0281 Dementia in other diseases classified elsewhere with behavioral disturbance: Secondary | ICD-10-CM | POA: Diagnosis not present

## 2017-11-29 DIAGNOSIS — R338 Other retention of urine: Secondary | ICD-10-CM | POA: Diagnosis not present

## 2017-11-29 DIAGNOSIS — G20 Parkinson's disease: Secondary | ICD-10-CM | POA: Diagnosis not present

## 2017-11-29 DIAGNOSIS — J181 Lobar pneumonia, unspecified organism: Secondary | ICD-10-CM | POA: Diagnosis not present

## 2017-11-29 DIAGNOSIS — F319 Bipolar disorder, unspecified: Secondary | ICD-10-CM | POA: Diagnosis not present

## 2017-11-29 DIAGNOSIS — N189 Chronic kidney disease, unspecified: Secondary | ICD-10-CM | POA: Diagnosis not present

## 2017-11-29 DIAGNOSIS — E1122 Type 2 diabetes mellitus with diabetic chronic kidney disease: Secondary | ICD-10-CM | POA: Diagnosis not present

## 2017-11-29 DIAGNOSIS — I129 Hypertensive chronic kidney disease with stage 1 through stage 4 chronic kidney disease, or unspecified chronic kidney disease: Secondary | ICD-10-CM | POA: Diagnosis not present

## 2017-11-29 DIAGNOSIS — N401 Enlarged prostate with lower urinary tract symptoms: Secondary | ICD-10-CM | POA: Diagnosis not present

## 2017-11-29 DIAGNOSIS — F0281 Dementia in other diseases classified elsewhere with behavioral disturbance: Secondary | ICD-10-CM | POA: Diagnosis not present

## 2017-12-03 DIAGNOSIS — E1122 Type 2 diabetes mellitus with diabetic chronic kidney disease: Secondary | ICD-10-CM | POA: Diagnosis not present

## 2017-12-03 DIAGNOSIS — N189 Chronic kidney disease, unspecified: Secondary | ICD-10-CM | POA: Diagnosis not present

## 2017-12-03 DIAGNOSIS — F0281 Dementia in other diseases classified elsewhere with behavioral disturbance: Secondary | ICD-10-CM | POA: Diagnosis not present

## 2017-12-03 DIAGNOSIS — N401 Enlarged prostate with lower urinary tract symptoms: Secondary | ICD-10-CM | POA: Diagnosis not present

## 2017-12-03 DIAGNOSIS — R338 Other retention of urine: Secondary | ICD-10-CM | POA: Diagnosis not present

## 2017-12-03 DIAGNOSIS — F319 Bipolar disorder, unspecified: Secondary | ICD-10-CM | POA: Diagnosis not present

## 2017-12-03 DIAGNOSIS — G20 Parkinson's disease: Secondary | ICD-10-CM | POA: Diagnosis not present

## 2017-12-03 DIAGNOSIS — J181 Lobar pneumonia, unspecified organism: Secondary | ICD-10-CM | POA: Diagnosis not present

## 2017-12-03 DIAGNOSIS — I129 Hypertensive chronic kidney disease with stage 1 through stage 4 chronic kidney disease, or unspecified chronic kidney disease: Secondary | ICD-10-CM | POA: Diagnosis not present

## 2017-12-04 DIAGNOSIS — F0281 Dementia in other diseases classified elsewhere with behavioral disturbance: Secondary | ICD-10-CM | POA: Diagnosis not present

## 2017-12-04 DIAGNOSIS — G20 Parkinson's disease: Secondary | ICD-10-CM | POA: Diagnosis not present

## 2017-12-04 DIAGNOSIS — N189 Chronic kidney disease, unspecified: Secondary | ICD-10-CM | POA: Diagnosis not present

## 2017-12-04 DIAGNOSIS — E1122 Type 2 diabetes mellitus with diabetic chronic kidney disease: Secondary | ICD-10-CM | POA: Diagnosis not present

## 2017-12-04 DIAGNOSIS — F319 Bipolar disorder, unspecified: Secondary | ICD-10-CM | POA: Diagnosis not present

## 2017-12-04 DIAGNOSIS — I129 Hypertensive chronic kidney disease with stage 1 through stage 4 chronic kidney disease, or unspecified chronic kidney disease: Secondary | ICD-10-CM | POA: Diagnosis not present

## 2017-12-04 DIAGNOSIS — J181 Lobar pneumonia, unspecified organism: Secondary | ICD-10-CM | POA: Diagnosis not present

## 2017-12-04 DIAGNOSIS — N401 Enlarged prostate with lower urinary tract symptoms: Secondary | ICD-10-CM | POA: Diagnosis not present

## 2017-12-04 DIAGNOSIS — R338 Other retention of urine: Secondary | ICD-10-CM | POA: Diagnosis not present

## 2017-12-07 DIAGNOSIS — J181 Lobar pneumonia, unspecified organism: Secondary | ICD-10-CM | POA: Diagnosis not present

## 2017-12-07 DIAGNOSIS — G20 Parkinson's disease: Secondary | ICD-10-CM | POA: Diagnosis not present

## 2017-12-07 DIAGNOSIS — E1122 Type 2 diabetes mellitus with diabetic chronic kidney disease: Secondary | ICD-10-CM | POA: Diagnosis not present

## 2017-12-07 DIAGNOSIS — N189 Chronic kidney disease, unspecified: Secondary | ICD-10-CM | POA: Diagnosis not present

## 2017-12-07 DIAGNOSIS — F319 Bipolar disorder, unspecified: Secondary | ICD-10-CM | POA: Diagnosis not present

## 2017-12-07 DIAGNOSIS — I129 Hypertensive chronic kidney disease with stage 1 through stage 4 chronic kidney disease, or unspecified chronic kidney disease: Secondary | ICD-10-CM | POA: Diagnosis not present

## 2017-12-07 DIAGNOSIS — F0281 Dementia in other diseases classified elsewhere with behavioral disturbance: Secondary | ICD-10-CM | POA: Diagnosis not present

## 2017-12-07 DIAGNOSIS — R338 Other retention of urine: Secondary | ICD-10-CM | POA: Diagnosis not present

## 2017-12-07 DIAGNOSIS — N401 Enlarged prostate with lower urinary tract symptoms: Secondary | ICD-10-CM | POA: Diagnosis not present

## 2017-12-11 DIAGNOSIS — R338 Other retention of urine: Secondary | ICD-10-CM | POA: Diagnosis not present

## 2017-12-11 DIAGNOSIS — J181 Lobar pneumonia, unspecified organism: Secondary | ICD-10-CM | POA: Diagnosis not present

## 2017-12-11 DIAGNOSIS — N189 Chronic kidney disease, unspecified: Secondary | ICD-10-CM | POA: Diagnosis not present

## 2017-12-11 DIAGNOSIS — I129 Hypertensive chronic kidney disease with stage 1 through stage 4 chronic kidney disease, or unspecified chronic kidney disease: Secondary | ICD-10-CM | POA: Diagnosis not present

## 2017-12-11 DIAGNOSIS — N401 Enlarged prostate with lower urinary tract symptoms: Secondary | ICD-10-CM | POA: Diagnosis not present

## 2017-12-11 DIAGNOSIS — E1122 Type 2 diabetes mellitus with diabetic chronic kidney disease: Secondary | ICD-10-CM | POA: Diagnosis not present

## 2017-12-11 DIAGNOSIS — G20 Parkinson's disease: Secondary | ICD-10-CM | POA: Diagnosis not present

## 2017-12-11 DIAGNOSIS — F319 Bipolar disorder, unspecified: Secondary | ICD-10-CM | POA: Diagnosis not present

## 2017-12-11 DIAGNOSIS — F0281 Dementia in other diseases classified elsewhere with behavioral disturbance: Secondary | ICD-10-CM | POA: Diagnosis not present

## 2017-12-12 DIAGNOSIS — M62561 Muscle wasting and atrophy, not elsewhere classified, right lower leg: Secondary | ICD-10-CM | POA: Diagnosis not present

## 2017-12-12 DIAGNOSIS — G894 Chronic pain syndrome: Secondary | ICD-10-CM | POA: Diagnosis not present

## 2017-12-12 DIAGNOSIS — M47816 Spondylosis without myelopathy or radiculopathy, lumbar region: Secondary | ICD-10-CM | POA: Diagnosis not present

## 2017-12-12 DIAGNOSIS — F329 Major depressive disorder, single episode, unspecified: Secondary | ICD-10-CM | POA: Diagnosis not present

## 2017-12-12 DIAGNOSIS — M961 Postlaminectomy syndrome, not elsewhere classified: Secondary | ICD-10-CM | POA: Diagnosis not present

## 2017-12-12 DIAGNOSIS — E119 Type 2 diabetes mellitus without complications: Secondary | ICD-10-CM | POA: Diagnosis not present

## 2017-12-12 DIAGNOSIS — G20 Parkinson's disease: Secondary | ICD-10-CM | POA: Diagnosis not present

## 2017-12-15 DIAGNOSIS — G219 Secondary parkinsonism, unspecified: Secondary | ICD-10-CM | POA: Diagnosis not present

## 2017-12-15 DIAGNOSIS — R634 Abnormal weight loss: Secondary | ICD-10-CM | POA: Diagnosis not present

## 2017-12-15 DIAGNOSIS — R2689 Other abnormalities of gait and mobility: Secondary | ICD-10-CM | POA: Diagnosis not present

## 2017-12-15 DIAGNOSIS — F0281 Dementia in other diseases classified elsewhere with behavioral disturbance: Secondary | ICD-10-CM | POA: Diagnosis not present

## 2017-12-15 DIAGNOSIS — G3109 Other frontotemporal dementia: Secondary | ICD-10-CM | POA: Diagnosis not present

## 2017-12-15 DIAGNOSIS — G20 Parkinson's disease: Secondary | ICD-10-CM | POA: Diagnosis not present

## 2017-12-15 DIAGNOSIS — E1129 Type 2 diabetes mellitus with other diabetic kidney complication: Secondary | ICD-10-CM | POA: Diagnosis not present

## 2017-12-17 ENCOUNTER — Ambulatory Visit: Payer: Medicare HMO | Admitting: Podiatry

## 2017-12-17 DIAGNOSIS — B351 Tinea unguium: Secondary | ICD-10-CM

## 2017-12-17 DIAGNOSIS — E119 Type 2 diabetes mellitus without complications: Secondary | ICD-10-CM

## 2017-12-17 DIAGNOSIS — D689 Coagulation defect, unspecified: Secondary | ICD-10-CM

## 2017-12-17 DIAGNOSIS — E139 Other specified diabetes mellitus without complications: Secondary | ICD-10-CM

## 2017-12-17 NOTE — Progress Notes (Signed)
Subjective:  Patient ID: [name], male    DOB: 11-15-1940,  MRN: [id number]  Chief Complaint  Patient presents with  . debride    B/L nail trimming Pt. stated," nails have gotten to long and I can't cut them myself."  . foot exam    B/L diabetic foot exam -sugar: 110 A1C: 5.7 PCP: Brigitte Pulse LOV: x 3 mo   77 y.o. male returns for diabetic foot care. Last AMBS was 110, last A1c 5.7 . Denies numbness and tingling in their feet. Denies cramping in legs and thighs. Denies N/V/F/Ch and other pedal issues.  PCP Dr. Brigitte Pulse last seen 2 months ago.  Past Medical History:  Diagnosis Date  . Anxiety    Xanax as needed  . Arthritis    spinal stenosis.  . Asthma   . Bipolar disorder ([place])   . Cataract   . Chronic kidney disease    since NSAID's stopped -function has improved  . COPD (chronic obstructive pulmonary disease) ([place])   . Crohn's colitis ([place])   . Depression    takes Cymbalta daily  . Diverticulosis 01/2007  . DM (diabetes mellitus) ([place])    takes Januvia now, metformin stopped due to diarrhea  . ED (erectile dysfunction)   . GERD (gastroesophageal reflux disease)    uses Tums  . Glaucoma    surgery to correct  . History of colon polyps   . HLD (hyperlipidemia)    takes Lipitor daily  . HTN (hypertension)    takes Altace and Diovan daily  . Impaired hearing    "getting this checked out on Monday"  . Insomnia   . Internal hemorrhoids 01/2007  . Joint pain   . Joint swelling   . Microscopic colitis   . Nocturia   . Obesity   . Pneumonia 3/09  . Sleep apnea    uses CPAP;has been greater than 59yrs since sleep study- Dr. Elsworth Soho   Past Surgical History:  Procedure Laterality Date  . CATARACT EXTRACTION W/ INTRAOCULAR LENS  IMPLANT, BILATERAL  2011  . CHOLECYSTECTOMY  2011  . COLONOSCOPY W/ BIOPSIES  2008   Looks normal, biopsy suggested a chronic microscopic colitis with rare granulomas  . COLONOSCOPY W/ BIOPSIES  02/2011   diverticulosis, internal hemorrhoids,  patchy chronic colitis on random bxs,   . EYE SURGERY     for glaucoma  . HERNIA REPAIR    . LUMBAR LAMINECTOMY/DECOMPRESSION MICRODISCECTOMY N/A 08/04/2015   Procedure: CENTRAL DECOMPRESSION  LUMBAR LAMINECTOMY L4-L5 ;  Surgeon: Latanya Maudlin, MD;  Location: [facility];  Service: Orthopedics;  Laterality: N/A;  . NOSE SURGERY  2011  . PENILE PROSTHESIS IMPLANT  2012  . ROTATOR CUFF REPAIR Bilateral    '12 -left, '13 -right  . SHOULDER ARTHROSCOPY  08/24/2011   Procedure: ARTHROSCOPY SHOULDER;  Surgeon: Metta Clines Supple;  Location: [facility];  Service: Orthopedics;  Laterality: Right;  RIGHT SHOULDER ARTHROSCOPY WITH SUBACROMINAL DECOMPRESSION AND DISTAL CLAVICAL RESECTION   . sleep apnea surgery  1997  . TONSILLECTOMY     as a child  . UMBILICAL HERNIA REPAIR  5+yrs ago    Current Outpatient Medications:  .  acetaminophen (TYLENOL) 325 MG tablet, Take 650 mg by mouth daily., Disp: , Rfl:  .  albuterol (PROAIR HFA) 108 (90 BASE) MCG/ACT inhaler, Inhale 2 puffs into the lungs every 4 (four) hours as needed for wheezing or shortness of breath. , Disp: , Rfl:  .  amLODipine (NORVASC) 10 MG tablet, Take 10  mg by mouth daily., Disp: , Rfl:  .  ARIPiprazole (ABILIFY) 2 MG tablet, Take 2 mg daily by mouth., Disp: , Rfl:  .  atorvastatin (LIPITOR) 10 MG tablet, Take 10 mg by mouth every other day. , Disp: , Rfl:  .  bismuth subsalicylate (PEPTO BISMOL) 262 MG/15ML suspension, Take 30 mLs by mouth 2 (two) times daily as needed for indigestion. , Disp: , Rfl:  .  Calcium Carbonate Antacid (TUMS PO), Take 4 tablets by mouth 4 (four) times daily as needed (heartburn). Reported on 10/06/2015, Disp: , Rfl:  .  carbidopa-levodopa (SINEMET IR) 10-100 MG tablet, Take 1 tablet 3 (three) times daily by mouth., Disp: , Rfl:  .  clopidogrel (PLAVIX) 75 MG tablet, Take 75 mg by mouth daily., Disp: , Rfl:  .  cyanocobalamin (,VITAMIN B-12,) 1000 MCG/ML injection, Inject 1,000 mcg into the muscle every 30 (thirty) days.,  Disp: , Rfl:  .  divalproex (DEPAKOTE ER) 500 MG 24 hr tablet, Take 500 mg by mouth 2 (two) times daily., Disp: , Rfl:  .  Fluticasone-Salmeterol (ADVAIR) 250-50 MCG/DOSE AEPB, Inhale 1 puff into the lungs daily., Disp: , Rfl:  .  folic acid (FOLVITE) 1 MG tablet, Take 1 mg by mouth daily., Disp: , Rfl:  .  levothyroxine (SYNTHROID, LEVOTHROID) 50 MCG tablet, Take 50 mcg daily before breakfast by mouth., Disp: , Rfl:  .  MELATONIN PO, Take 9 mg daily by mouth. 2 tablets at bedtime, Disp: , Rfl:  .  memantine (NAMENDA) 10 MG tablet, Take 10 mg by mouth 2 (two) times daily. Reported on 10/06/2015, Disp: , Rfl:  .  metFORMIN (GLUCOPHAGE) 500 MG tablet, Take 500 mg by mouth 2 (two) times daily with a meal., Disp: , Rfl:  .  Multiple Vitamin (MULTIVITAMIN PO), Take 1 tablet by mouth daily. , Disp: , Rfl:  .  oxycodone (OXY-IR) 5 MG capsule, Take 5 mg by mouth every 4 (four) hours as needed., Disp: , Rfl:  .  pantoprazole (PROTONIX) 40 MG tablet, Take 40 mg daily by mouth., Disp: , Rfl:  .  QUEtiapine (SEROQUEL) 100 MG tablet, TAKE 1 TABLET BY MOUTH AT BEDTIME AFTER 2 NIGHTS, MAY INCREASE TO 2 TABLETS BY MOUTH AT BEDTIME, Disp: 60 tablet, Rfl: 5 .  ranitidine (ZANTAC) 150 MG tablet, Take 150 mg 2 (two) times daily by mouth., Disp: , Rfl:  .  tamsulosin (FLOMAX) 0.4 MG CAPS capsule, Take 0.4 mg by mouth daily., Disp: , Rfl:  .  temazepam (RESTORIL) 15 MG capsule, Take 1 capsule (15 mg total) by mouth at bedtime. Reported on 10/06/2015, Disp: 30 capsule, Rfl: 5 .  traMADol (ULTRAM) 50 MG tablet, Take 50 mg by mouth every 6 (six) hours as needed., Disp: , Rfl:  .  traZODone (DESYREL) 50 MG tablet, Take 100 mg by mouth at bedtime. Reported on 10/06/2015, Disp: , Rfl:   Allergies  Allergen Reactions  . Shellfish Allergy Anaphylaxis    Patient informed RN no problem with Betadine 08/04/2015     Objective:  There were no vitals filed for this visit. General AA&O x3. Normal mood and affect.  Vascular  Dorsalis pedis pulses present 1+ bilaterally  Posterior tibial pulses present 1+ bilaterally  Capillary refill normal to all digits. Pedal hair growth diminished.  Neurologic Epicritic sensation present bilaterally. Protective sensation with 5.07 monofilament  present bilaterally. Vibratory sensation present bilaterally.  Dermatologic No open lesions. Interspaces clear of maceration.  Normal skin temperature and turgor. Hyperkeratotic lesions: lateral  hallux R. Nails: brittle, discoloration yellow, irregular, wavy nails, thickening  Orthopedic: No history of amputation. MMT 5/5 in dorsiflexion, plantarflexion, inversion, and eversion. Normal lower extremity joint ROM without pain or crepitus.   Assessment & Plan:  Patient was evaluated and treated and all questions answered.  Diabetes with Coagulation Defec, Onychomycosis -Educated on diabetic footcare. Diabetic risk level 1 -At risk foot care provided as below.  Procedure: Nail Debridement Rationale: Patient meets criteria for routine foot care due to Coagulation defect Type of Debridement: manual, sharp debridement. Instrumentation: Nail nipper, rotary burr. Number of Nails: 10  Return in about 3 months (around 03/19/2018) for Diabetic Foot Care.

## 2017-12-25 DIAGNOSIS — E785 Hyperlipidemia, unspecified: Secondary | ICD-10-CM | POA: Diagnosis not present

## 2017-12-25 DIAGNOSIS — Z8673 Personal history of transient ischemic attack (TIA), and cerebral infarction without residual deficits: Secondary | ICD-10-CM | POA: Diagnosis not present

## 2017-12-25 DIAGNOSIS — R4182 Altered mental status, unspecified: Secondary | ICD-10-CM | POA: Diagnosis not present

## 2017-12-25 DIAGNOSIS — E119 Type 2 diabetes mellitus without complications: Secondary | ICD-10-CM | POA: Diagnosis not present

## 2017-12-25 DIAGNOSIS — E872 Acidosis: Secondary | ICD-10-CM | POA: Diagnosis not present

## 2017-12-25 DIAGNOSIS — N400 Enlarged prostate without lower urinary tract symptoms: Secondary | ICD-10-CM | POA: Diagnosis not present

## 2017-12-25 DIAGNOSIS — G9341 Metabolic encephalopathy: Secondary | ICD-10-CM | POA: Diagnosis not present

## 2017-12-25 DIAGNOSIS — N179 Acute kidney failure, unspecified: Secondary | ICD-10-CM | POA: Diagnosis not present

## 2017-12-25 DIAGNOSIS — R531 Weakness: Secondary | ICD-10-CM | POA: Diagnosis not present

## 2017-12-25 DIAGNOSIS — R410 Disorientation, unspecified: Secondary | ICD-10-CM | POA: Diagnosis not present

## 2017-12-25 DIAGNOSIS — R404 Transient alteration of awareness: Secondary | ICD-10-CM | POA: Diagnosis not present

## 2017-12-25 DIAGNOSIS — E860 Dehydration: Secondary | ICD-10-CM | POA: Diagnosis not present

## 2017-12-25 DIAGNOSIS — N289 Disorder of kidney and ureter, unspecified: Secondary | ICD-10-CM | POA: Diagnosis not present

## 2017-12-26 DIAGNOSIS — I10 Essential (primary) hypertension: Secondary | ICD-10-CM | POA: Diagnosis not present

## 2017-12-26 DIAGNOSIS — N289 Disorder of kidney and ureter, unspecified: Secondary | ICD-10-CM | POA: Diagnosis not present

## 2017-12-26 DIAGNOSIS — E119 Type 2 diabetes mellitus without complications: Secondary | ICD-10-CM | POA: Diagnosis not present

## 2017-12-26 DIAGNOSIS — E785 Hyperlipidemia, unspecified: Secondary | ICD-10-CM | POA: Diagnosis not present

## 2017-12-26 DIAGNOSIS — R531 Weakness: Secondary | ICD-10-CM | POA: Diagnosis not present

## 2017-12-26 DIAGNOSIS — R4182 Altered mental status, unspecified: Secondary | ICD-10-CM | POA: Diagnosis not present

## 2017-12-26 DIAGNOSIS — R7989 Other specified abnormal findings of blood chemistry: Secondary | ICD-10-CM

## 2017-12-26 DIAGNOSIS — G9341 Metabolic encephalopathy: Secondary | ICD-10-CM | POA: Diagnosis not present

## 2017-12-26 DIAGNOSIS — N179 Acute kidney failure, unspecified: Secondary | ICD-10-CM | POA: Diagnosis not present

## 2017-12-26 DIAGNOSIS — G4733 Obstructive sleep apnea (adult) (pediatric): Secondary | ICD-10-CM | POA: Diagnosis not present

## 2017-12-26 DIAGNOSIS — N400 Enlarged prostate without lower urinary tract symptoms: Secondary | ICD-10-CM | POA: Diagnosis not present

## 2017-12-26 DIAGNOSIS — Z8673 Personal history of transient ischemic attack (TIA), and cerebral infarction without residual deficits: Secondary | ICD-10-CM | POA: Diagnosis not present

## 2017-12-26 DIAGNOSIS — E872 Acidosis: Secondary | ICD-10-CM | POA: Diagnosis not present

## 2017-12-26 DIAGNOSIS — R410 Disorientation, unspecified: Secondary | ICD-10-CM | POA: Diagnosis not present

## 2017-12-26 DIAGNOSIS — D721 Eosinophilia: Secondary | ICD-10-CM | POA: Diagnosis not present

## 2017-12-26 DIAGNOSIS — E860 Dehydration: Secondary | ICD-10-CM | POA: Diagnosis not present

## 2017-12-27 DIAGNOSIS — G9341 Metabolic encephalopathy: Secondary | ICD-10-CM | POA: Diagnosis not present

## 2017-12-27 DIAGNOSIS — R531 Weakness: Secondary | ICD-10-CM | POA: Diagnosis not present

## 2017-12-27 DIAGNOSIS — Z79899 Other long term (current) drug therapy: Secondary | ICD-10-CM | POA: Diagnosis not present

## 2018-01-02 DIAGNOSIS — E1122 Type 2 diabetes mellitus with diabetic chronic kidney disease: Secondary | ICD-10-CM | POA: Diagnosis not present

## 2018-01-02 DIAGNOSIS — N401 Enlarged prostate with lower urinary tract symptoms: Secondary | ICD-10-CM | POA: Diagnosis not present

## 2018-01-02 DIAGNOSIS — F319 Bipolar disorder, unspecified: Secondary | ICD-10-CM | POA: Diagnosis not present

## 2018-01-02 DIAGNOSIS — I129 Hypertensive chronic kidney disease with stage 1 through stage 4 chronic kidney disease, or unspecified chronic kidney disease: Secondary | ICD-10-CM | POA: Diagnosis not present

## 2018-01-02 DIAGNOSIS — F0281 Dementia in other diseases classified elsewhere with behavioral disturbance: Secondary | ICD-10-CM | POA: Diagnosis not present

## 2018-01-02 DIAGNOSIS — N189 Chronic kidney disease, unspecified: Secondary | ICD-10-CM | POA: Diagnosis not present

## 2018-01-02 DIAGNOSIS — G20 Parkinson's disease: Secondary | ICD-10-CM | POA: Diagnosis not present

## 2018-01-02 DIAGNOSIS — R338 Other retention of urine: Secondary | ICD-10-CM | POA: Diagnosis not present

## 2018-01-02 DIAGNOSIS — G9341 Metabolic encephalopathy: Secondary | ICD-10-CM | POA: Diagnosis not present

## 2018-01-04 DIAGNOSIS — R338 Other retention of urine: Secondary | ICD-10-CM | POA: Diagnosis not present

## 2018-01-04 DIAGNOSIS — G9341 Metabolic encephalopathy: Secondary | ICD-10-CM | POA: Diagnosis not present

## 2018-01-04 DIAGNOSIS — N401 Enlarged prostate with lower urinary tract symptoms: Secondary | ICD-10-CM | POA: Diagnosis not present

## 2018-01-04 DIAGNOSIS — F0281 Dementia in other diseases classified elsewhere with behavioral disturbance: Secondary | ICD-10-CM | POA: Diagnosis not present

## 2018-01-04 DIAGNOSIS — E1122 Type 2 diabetes mellitus with diabetic chronic kidney disease: Secondary | ICD-10-CM | POA: Diagnosis not present

## 2018-01-04 DIAGNOSIS — N189 Chronic kidney disease, unspecified: Secondary | ICD-10-CM | POA: Diagnosis not present

## 2018-01-04 DIAGNOSIS — G20 Parkinson's disease: Secondary | ICD-10-CM | POA: Diagnosis not present

## 2018-01-04 DIAGNOSIS — F319 Bipolar disorder, unspecified: Secondary | ICD-10-CM | POA: Diagnosis not present

## 2018-01-04 DIAGNOSIS — I129 Hypertensive chronic kidney disease with stage 1 through stage 4 chronic kidney disease, or unspecified chronic kidney disease: Secondary | ICD-10-CM | POA: Diagnosis not present

## 2018-01-09 DIAGNOSIS — G219 Secondary parkinsonism, unspecified: Secondary | ICD-10-CM | POA: Diagnosis not present

## 2018-01-09 DIAGNOSIS — G63 Polyneuropathy in diseases classified elsewhere: Secondary | ICD-10-CM | POA: Diagnosis not present

## 2018-01-09 DIAGNOSIS — F0280 Dementia in other diseases classified elsewhere without behavioral disturbance: Secondary | ICD-10-CM | POA: Diagnosis not present

## 2018-01-09 DIAGNOSIS — R269 Unspecified abnormalities of gait and mobility: Secondary | ICD-10-CM | POA: Diagnosis not present

## 2018-01-09 DIAGNOSIS — G20 Parkinson's disease: Secondary | ICD-10-CM | POA: Diagnosis not present

## 2018-01-15 DIAGNOSIS — E1129 Type 2 diabetes mellitus with other diabetic kidney complication: Secondary | ICD-10-CM | POA: Diagnosis not present

## 2018-01-15 DIAGNOSIS — R634 Abnormal weight loss: Secondary | ICD-10-CM | POA: Diagnosis not present

## 2018-01-15 DIAGNOSIS — F0281 Dementia in other diseases classified elsewhere with behavioral disturbance: Secondary | ICD-10-CM | POA: Diagnosis not present

## 2018-01-15 DIAGNOSIS — G3109 Other frontotemporal dementia: Secondary | ICD-10-CM | POA: Diagnosis not present

## 2018-01-15 DIAGNOSIS — G219 Secondary parkinsonism, unspecified: Secondary | ICD-10-CM | POA: Diagnosis not present

## 2018-01-15 DIAGNOSIS — G20 Parkinson's disease: Secondary | ICD-10-CM | POA: Diagnosis not present

## 2018-01-15 DIAGNOSIS — R2689 Other abnormalities of gait and mobility: Secondary | ICD-10-CM | POA: Diagnosis not present

## 2018-01-23 DIAGNOSIS — E119 Type 2 diabetes mellitus without complications: Secondary | ICD-10-CM | POA: Diagnosis not present

## 2018-01-23 DIAGNOSIS — M47816 Spondylosis without myelopathy or radiculopathy, lumbar region: Secondary | ICD-10-CM | POA: Diagnosis not present

## 2018-01-23 DIAGNOSIS — F329 Major depressive disorder, single episode, unspecified: Secondary | ICD-10-CM | POA: Diagnosis not present

## 2018-01-23 DIAGNOSIS — M961 Postlaminectomy syndrome, not elsewhere classified: Secondary | ICD-10-CM | POA: Diagnosis not present

## 2018-01-23 DIAGNOSIS — G20 Parkinson's disease: Secondary | ICD-10-CM | POA: Diagnosis not present

## 2018-01-23 DIAGNOSIS — G894 Chronic pain syndrome: Secondary | ICD-10-CM | POA: Diagnosis not present

## 2018-01-23 DIAGNOSIS — M62561 Muscle wasting and atrophy, not elsewhere classified, right lower leg: Secondary | ICD-10-CM | POA: Diagnosis not present

## 2018-02-13 DIAGNOSIS — F329 Major depressive disorder, single episode, unspecified: Secondary | ICD-10-CM | POA: Diagnosis not present

## 2018-02-13 DIAGNOSIS — G894 Chronic pain syndrome: Secondary | ICD-10-CM | POA: Diagnosis not present

## 2018-02-13 DIAGNOSIS — M47816 Spondylosis without myelopathy or radiculopathy, lumbar region: Secondary | ICD-10-CM | POA: Diagnosis not present

## 2018-02-13 DIAGNOSIS — E119 Type 2 diabetes mellitus without complications: Secondary | ICD-10-CM | POA: Diagnosis not present

## 2018-02-13 DIAGNOSIS — M961 Postlaminectomy syndrome, not elsewhere classified: Secondary | ICD-10-CM | POA: Diagnosis not present

## 2018-02-13 DIAGNOSIS — M62561 Muscle wasting and atrophy, not elsewhere classified, right lower leg: Secondary | ICD-10-CM | POA: Diagnosis not present

## 2018-02-13 DIAGNOSIS — G20 Parkinson's disease: Secondary | ICD-10-CM | POA: Diagnosis not present

## 2018-02-14 DIAGNOSIS — G219 Secondary parkinsonism, unspecified: Secondary | ICD-10-CM | POA: Diagnosis not present

## 2018-02-14 DIAGNOSIS — F0281 Dementia in other diseases classified elsewhere with behavioral disturbance: Secondary | ICD-10-CM | POA: Diagnosis not present

## 2018-02-14 DIAGNOSIS — G3109 Other frontotemporal dementia: Secondary | ICD-10-CM | POA: Diagnosis not present

## 2018-02-14 DIAGNOSIS — E1129 Type 2 diabetes mellitus with other diabetic kidney complication: Secondary | ICD-10-CM | POA: Diagnosis not present

## 2018-02-14 DIAGNOSIS — R2689 Other abnormalities of gait and mobility: Secondary | ICD-10-CM | POA: Diagnosis not present

## 2018-02-14 DIAGNOSIS — G20 Parkinson's disease: Secondary | ICD-10-CM | POA: Diagnosis not present

## 2018-02-14 DIAGNOSIS — R634 Abnormal weight loss: Secondary | ICD-10-CM | POA: Diagnosis not present

## 2018-02-21 DIAGNOSIS — Z6828 Body mass index (BMI) 28.0-28.9, adult: Secondary | ICD-10-CM | POA: Diagnosis not present

## 2018-02-21 DIAGNOSIS — G4733 Obstructive sleep apnea (adult) (pediatric): Secondary | ICD-10-CM | POA: Diagnosis not present

## 2018-02-21 DIAGNOSIS — R269 Unspecified abnormalities of gait and mobility: Secondary | ICD-10-CM | POA: Diagnosis not present

## 2018-02-21 DIAGNOSIS — G3109 Other frontotemporal dementia: Secondary | ICD-10-CM | POA: Diagnosis not present

## 2018-02-27 DIAGNOSIS — G9341 Metabolic encephalopathy: Secondary | ICD-10-CM | POA: Diagnosis not present

## 2018-02-27 DIAGNOSIS — S0990XA Unspecified injury of head, initial encounter: Secondary | ICD-10-CM | POA: Diagnosis not present

## 2018-02-27 DIAGNOSIS — Z87440 Personal history of urinary (tract) infections: Secondary | ICD-10-CM | POA: Diagnosis not present

## 2018-02-27 DIAGNOSIS — I10 Essential (primary) hypertension: Secondary | ICD-10-CM | POA: Diagnosis not present

## 2018-02-27 DIAGNOSIS — N400 Enlarged prostate without lower urinary tract symptoms: Secondary | ICD-10-CM | POA: Diagnosis not present

## 2018-02-27 DIAGNOSIS — R531 Weakness: Secondary | ICD-10-CM | POA: Diagnosis not present

## 2018-02-27 DIAGNOSIS — R0902 Hypoxemia: Secondary | ICD-10-CM | POA: Diagnosis not present

## 2018-02-27 DIAGNOSIS — S199XXA Unspecified injury of neck, initial encounter: Secondary | ICD-10-CM | POA: Diagnosis not present

## 2018-02-27 DIAGNOSIS — N3000 Acute cystitis without hematuria: Secondary | ICD-10-CM | POA: Diagnosis not present

## 2018-02-27 DIAGNOSIS — R0602 Shortness of breath: Secondary | ICD-10-CM | POA: Diagnosis not present

## 2018-02-27 DIAGNOSIS — B9689 Other specified bacterial agents as the cause of diseases classified elsewhere: Secondary | ICD-10-CM | POA: Diagnosis not present

## 2018-02-27 DIAGNOSIS — G4730 Sleep apnea, unspecified: Secondary | ICD-10-CM | POA: Diagnosis not present

## 2018-02-27 DIAGNOSIS — G20 Parkinson's disease: Secondary | ICD-10-CM | POA: Diagnosis not present

## 2018-02-27 DIAGNOSIS — F0280 Dementia in other diseases classified elsewhere without behavioral disturbance: Secondary | ICD-10-CM | POA: Diagnosis not present

## 2018-02-27 DIAGNOSIS — R569 Unspecified convulsions: Secondary | ICD-10-CM | POA: Diagnosis not present

## 2018-02-27 DIAGNOSIS — N390 Urinary tract infection, site not specified: Secondary | ICD-10-CM | POA: Diagnosis not present

## 2018-02-28 DIAGNOSIS — N390 Urinary tract infection, site not specified: Secondary | ICD-10-CM

## 2018-02-28 DIAGNOSIS — G9341 Metabolic encephalopathy: Secondary | ICD-10-CM

## 2018-03-01 DIAGNOSIS — G9341 Metabolic encephalopathy: Secondary | ICD-10-CM | POA: Diagnosis not present

## 2018-03-01 DIAGNOSIS — N390 Urinary tract infection, site not specified: Secondary | ICD-10-CM | POA: Diagnosis not present

## 2018-03-07 DIAGNOSIS — M2550 Pain in unspecified joint: Secondary | ICD-10-CM | POA: Diagnosis not present

## 2018-03-07 DIAGNOSIS — R2689 Other abnormalities of gait and mobility: Secondary | ICD-10-CM | POA: Diagnosis not present

## 2018-03-07 DIAGNOSIS — R293 Abnormal posture: Secondary | ICD-10-CM | POA: Diagnosis not present

## 2018-03-07 DIAGNOSIS — R262 Difficulty in walking, not elsewhere classified: Secondary | ICD-10-CM | POA: Diagnosis not present

## 2018-03-07 DIAGNOSIS — M2560 Stiffness of unspecified joint, not elsewhere classified: Secondary | ICD-10-CM | POA: Diagnosis not present

## 2018-03-07 DIAGNOSIS — G20 Parkinson's disease: Secondary | ICD-10-CM | POA: Diagnosis not present

## 2018-03-18 ENCOUNTER — Ambulatory Visit: Payer: Medicare HMO | Admitting: Podiatry

## 2018-03-20 DIAGNOSIS — G4733 Obstructive sleep apnea (adult) (pediatric): Secondary | ICD-10-CM | POA: Diagnosis not present

## 2018-03-20 DIAGNOSIS — E119 Type 2 diabetes mellitus without complications: Secondary | ICD-10-CM | POA: Diagnosis not present

## 2018-03-20 DIAGNOSIS — N183 Chronic kidney disease, stage 3 (moderate): Secondary | ICD-10-CM | POA: Diagnosis not present

## 2018-03-20 DIAGNOSIS — F319 Bipolar disorder, unspecified: Secondary | ICD-10-CM | POA: Diagnosis not present

## 2018-03-25 DIAGNOSIS — R262 Difficulty in walking, not elsewhere classified: Secondary | ICD-10-CM | POA: Diagnosis not present

## 2018-03-25 DIAGNOSIS — R293 Abnormal posture: Secondary | ICD-10-CM | POA: Diagnosis not present

## 2018-03-25 DIAGNOSIS — M2550 Pain in unspecified joint: Secondary | ICD-10-CM | POA: Diagnosis not present

## 2018-03-25 DIAGNOSIS — R2689 Other abnormalities of gait and mobility: Secondary | ICD-10-CM | POA: Diagnosis not present

## 2018-03-25 DIAGNOSIS — G20 Parkinson's disease: Secondary | ICD-10-CM | POA: Diagnosis not present

## 2018-03-25 DIAGNOSIS — M2560 Stiffness of unspecified joint, not elsewhere classified: Secondary | ICD-10-CM | POA: Diagnosis not present

## 2018-03-26 DIAGNOSIS — M2550 Pain in unspecified joint: Secondary | ICD-10-CM | POA: Diagnosis not present

## 2018-03-26 DIAGNOSIS — R262 Difficulty in walking, not elsewhere classified: Secondary | ICD-10-CM | POA: Diagnosis not present

## 2018-03-26 DIAGNOSIS — R293 Abnormal posture: Secondary | ICD-10-CM | POA: Diagnosis not present

## 2018-03-26 DIAGNOSIS — M2560 Stiffness of unspecified joint, not elsewhere classified: Secondary | ICD-10-CM | POA: Diagnosis not present

## 2018-03-26 DIAGNOSIS — G20 Parkinson's disease: Secondary | ICD-10-CM | POA: Diagnosis not present

## 2018-03-26 DIAGNOSIS — R2689 Other abnormalities of gait and mobility: Secondary | ICD-10-CM | POA: Diagnosis not present

## 2018-03-28 DIAGNOSIS — M2550 Pain in unspecified joint: Secondary | ICD-10-CM | POA: Diagnosis not present

## 2018-03-28 DIAGNOSIS — R2689 Other abnormalities of gait and mobility: Secondary | ICD-10-CM | POA: Diagnosis not present

## 2018-03-28 DIAGNOSIS — M2560 Stiffness of unspecified joint, not elsewhere classified: Secondary | ICD-10-CM | POA: Diagnosis not present

## 2018-03-28 DIAGNOSIS — R293 Abnormal posture: Secondary | ICD-10-CM | POA: Diagnosis not present

## 2018-03-28 DIAGNOSIS — R262 Difficulty in walking, not elsewhere classified: Secondary | ICD-10-CM | POA: Diagnosis not present

## 2018-03-28 DIAGNOSIS — G20 Parkinson's disease: Secondary | ICD-10-CM | POA: Diagnosis not present

## 2018-03-29 DIAGNOSIS — R2689 Other abnormalities of gait and mobility: Secondary | ICD-10-CM | POA: Diagnosis not present

## 2018-03-29 DIAGNOSIS — R262 Difficulty in walking, not elsewhere classified: Secondary | ICD-10-CM | POA: Diagnosis not present

## 2018-03-29 DIAGNOSIS — M2550 Pain in unspecified joint: Secondary | ICD-10-CM | POA: Diagnosis not present

## 2018-03-29 DIAGNOSIS — G20 Parkinson's disease: Secondary | ICD-10-CM | POA: Diagnosis not present

## 2018-03-29 DIAGNOSIS — M2560 Stiffness of unspecified joint, not elsewhere classified: Secondary | ICD-10-CM | POA: Diagnosis not present

## 2018-03-29 DIAGNOSIS — R293 Abnormal posture: Secondary | ICD-10-CM | POA: Diagnosis not present

## 2018-04-01 DIAGNOSIS — R2689 Other abnormalities of gait and mobility: Secondary | ICD-10-CM | POA: Diagnosis not present

## 2018-04-01 DIAGNOSIS — R293 Abnormal posture: Secondary | ICD-10-CM | POA: Diagnosis not present

## 2018-04-01 DIAGNOSIS — M2550 Pain in unspecified joint: Secondary | ICD-10-CM | POA: Diagnosis not present

## 2018-04-01 DIAGNOSIS — R262 Difficulty in walking, not elsewhere classified: Secondary | ICD-10-CM | POA: Diagnosis not present

## 2018-04-01 DIAGNOSIS — M2560 Stiffness of unspecified joint, not elsewhere classified: Secondary | ICD-10-CM | POA: Diagnosis not present

## 2018-04-01 DIAGNOSIS — G20 Parkinson's disease: Secondary | ICD-10-CM | POA: Diagnosis not present

## 2018-04-02 DIAGNOSIS — R293 Abnormal posture: Secondary | ICD-10-CM | POA: Diagnosis not present

## 2018-04-02 DIAGNOSIS — G20 Parkinson's disease: Secondary | ICD-10-CM | POA: Diagnosis not present

## 2018-04-02 DIAGNOSIS — M2550 Pain in unspecified joint: Secondary | ICD-10-CM | POA: Diagnosis not present

## 2018-04-02 DIAGNOSIS — M2560 Stiffness of unspecified joint, not elsewhere classified: Secondary | ICD-10-CM | POA: Diagnosis not present

## 2018-04-02 DIAGNOSIS — R2689 Other abnormalities of gait and mobility: Secondary | ICD-10-CM | POA: Diagnosis not present

## 2018-04-02 DIAGNOSIS — R262 Difficulty in walking, not elsewhere classified: Secondary | ICD-10-CM | POA: Diagnosis not present

## 2018-04-04 DIAGNOSIS — G20 Parkinson's disease: Secondary | ICD-10-CM | POA: Diagnosis not present

## 2018-04-04 DIAGNOSIS — Z23 Encounter for immunization: Secondary | ICD-10-CM | POA: Diagnosis not present

## 2018-04-04 DIAGNOSIS — E1129 Type 2 diabetes mellitus with other diabetic kidney complication: Secondary | ICD-10-CM | POA: Diagnosis not present

## 2018-04-04 DIAGNOSIS — E538 Deficiency of other specified B group vitamins: Secondary | ICD-10-CM | POA: Diagnosis not present

## 2018-04-04 DIAGNOSIS — J45909 Unspecified asthma, uncomplicated: Secondary | ICD-10-CM | POA: Diagnosis not present

## 2018-04-04 DIAGNOSIS — R82998 Other abnormal findings in urine: Secondary | ICD-10-CM | POA: Diagnosis not present

## 2018-04-04 DIAGNOSIS — I10 Essential (primary) hypertension: Secondary | ICD-10-CM | POA: Diagnosis not present

## 2018-04-04 DIAGNOSIS — F3189 Other bipolar disorder: Secondary | ICD-10-CM | POA: Diagnosis not present

## 2018-04-04 DIAGNOSIS — Z0000 Encounter for general adult medical examination without abnormal findings: Secondary | ICD-10-CM | POA: Diagnosis not present

## 2018-04-04 DIAGNOSIS — E7849 Other hyperlipidemia: Secondary | ICD-10-CM | POA: Diagnosis not present

## 2018-04-04 DIAGNOSIS — G3109 Other frontotemporal dementia: Secondary | ICD-10-CM | POA: Diagnosis not present

## 2018-04-05 DIAGNOSIS — M2550 Pain in unspecified joint: Secondary | ICD-10-CM | POA: Diagnosis not present

## 2018-04-05 DIAGNOSIS — R262 Difficulty in walking, not elsewhere classified: Secondary | ICD-10-CM | POA: Diagnosis not present

## 2018-04-05 DIAGNOSIS — M2560 Stiffness of unspecified joint, not elsewhere classified: Secondary | ICD-10-CM | POA: Diagnosis not present

## 2018-04-05 DIAGNOSIS — G20 Parkinson's disease: Secondary | ICD-10-CM | POA: Diagnosis not present

## 2018-04-05 DIAGNOSIS — R293 Abnormal posture: Secondary | ICD-10-CM | POA: Diagnosis not present

## 2018-04-05 DIAGNOSIS — R2689 Other abnormalities of gait and mobility: Secondary | ICD-10-CM | POA: Diagnosis not present

## 2018-04-08 DIAGNOSIS — G20 Parkinson's disease: Secondary | ICD-10-CM | POA: Diagnosis not present

## 2018-04-08 DIAGNOSIS — R2689 Other abnormalities of gait and mobility: Secondary | ICD-10-CM | POA: Diagnosis not present

## 2018-04-08 DIAGNOSIS — M2550 Pain in unspecified joint: Secondary | ICD-10-CM | POA: Diagnosis not present

## 2018-04-08 DIAGNOSIS — M2560 Stiffness of unspecified joint, not elsewhere classified: Secondary | ICD-10-CM | POA: Diagnosis not present

## 2018-04-08 DIAGNOSIS — R293 Abnormal posture: Secondary | ICD-10-CM | POA: Diagnosis not present

## 2018-04-08 DIAGNOSIS — R262 Difficulty in walking, not elsewhere classified: Secondary | ICD-10-CM | POA: Diagnosis not present

## 2018-04-09 DIAGNOSIS — G20 Parkinson's disease: Secondary | ICD-10-CM | POA: Diagnosis not present

## 2018-04-09 DIAGNOSIS — M2550 Pain in unspecified joint: Secondary | ICD-10-CM | POA: Diagnosis not present

## 2018-04-09 DIAGNOSIS — R2689 Other abnormalities of gait and mobility: Secondary | ICD-10-CM | POA: Diagnosis not present

## 2018-04-09 DIAGNOSIS — M2560 Stiffness of unspecified joint, not elsewhere classified: Secondary | ICD-10-CM | POA: Diagnosis not present

## 2018-04-09 DIAGNOSIS — R262 Difficulty in walking, not elsewhere classified: Secondary | ICD-10-CM | POA: Diagnosis not present

## 2018-04-09 DIAGNOSIS — R293 Abnormal posture: Secondary | ICD-10-CM | POA: Diagnosis not present

## 2018-04-11 DIAGNOSIS — R262 Difficulty in walking, not elsewhere classified: Secondary | ICD-10-CM | POA: Diagnosis not present

## 2018-04-11 DIAGNOSIS — R293 Abnormal posture: Secondary | ICD-10-CM | POA: Diagnosis not present

## 2018-04-11 DIAGNOSIS — G20 Parkinson's disease: Secondary | ICD-10-CM | POA: Diagnosis not present

## 2018-04-11 DIAGNOSIS — M2560 Stiffness of unspecified joint, not elsewhere classified: Secondary | ICD-10-CM | POA: Diagnosis not present

## 2018-04-11 DIAGNOSIS — M2550 Pain in unspecified joint: Secondary | ICD-10-CM | POA: Diagnosis not present

## 2018-04-11 DIAGNOSIS — R2689 Other abnormalities of gait and mobility: Secondary | ICD-10-CM | POA: Diagnosis not present

## 2018-04-12 DIAGNOSIS — G20 Parkinson's disease: Secondary | ICD-10-CM | POA: Diagnosis not present

## 2018-04-12 DIAGNOSIS — R262 Difficulty in walking, not elsewhere classified: Secondary | ICD-10-CM | POA: Diagnosis not present

## 2018-04-12 DIAGNOSIS — R2689 Other abnormalities of gait and mobility: Secondary | ICD-10-CM | POA: Diagnosis not present

## 2018-04-12 DIAGNOSIS — R293 Abnormal posture: Secondary | ICD-10-CM | POA: Diagnosis not present

## 2018-04-12 DIAGNOSIS — M2550 Pain in unspecified joint: Secondary | ICD-10-CM | POA: Diagnosis not present

## 2018-04-12 DIAGNOSIS — M2560 Stiffness of unspecified joint, not elsewhere classified: Secondary | ICD-10-CM | POA: Diagnosis not present

## 2018-04-16 DIAGNOSIS — R2689 Other abnormalities of gait and mobility: Secondary | ICD-10-CM | POA: Diagnosis not present

## 2018-04-16 DIAGNOSIS — G20 Parkinson's disease: Secondary | ICD-10-CM | POA: Diagnosis not present

## 2018-04-16 DIAGNOSIS — R262 Difficulty in walking, not elsewhere classified: Secondary | ICD-10-CM | POA: Diagnosis not present

## 2018-04-16 DIAGNOSIS — M2550 Pain in unspecified joint: Secondary | ICD-10-CM | POA: Diagnosis not present

## 2018-04-16 DIAGNOSIS — R293 Abnormal posture: Secondary | ICD-10-CM | POA: Diagnosis not present

## 2018-04-16 DIAGNOSIS — M2560 Stiffness of unspecified joint, not elsewhere classified: Secondary | ICD-10-CM | POA: Diagnosis not present

## 2018-04-18 DIAGNOSIS — M2550 Pain in unspecified joint: Secondary | ICD-10-CM | POA: Diagnosis not present

## 2018-04-18 DIAGNOSIS — R293 Abnormal posture: Secondary | ICD-10-CM | POA: Diagnosis not present

## 2018-04-18 DIAGNOSIS — M2560 Stiffness of unspecified joint, not elsewhere classified: Secondary | ICD-10-CM | POA: Diagnosis not present

## 2018-04-18 DIAGNOSIS — R262 Difficulty in walking, not elsewhere classified: Secondary | ICD-10-CM | POA: Diagnosis not present

## 2018-04-18 DIAGNOSIS — R2689 Other abnormalities of gait and mobility: Secondary | ICD-10-CM | POA: Diagnosis not present

## 2018-04-18 DIAGNOSIS — G20 Parkinson's disease: Secondary | ICD-10-CM | POA: Diagnosis not present

## 2018-04-19 DIAGNOSIS — R293 Abnormal posture: Secondary | ICD-10-CM | POA: Diagnosis not present

## 2018-04-19 DIAGNOSIS — R2689 Other abnormalities of gait and mobility: Secondary | ICD-10-CM | POA: Diagnosis not present

## 2018-04-19 DIAGNOSIS — R262 Difficulty in walking, not elsewhere classified: Secondary | ICD-10-CM | POA: Diagnosis not present

## 2018-04-19 DIAGNOSIS — M2560 Stiffness of unspecified joint, not elsewhere classified: Secondary | ICD-10-CM | POA: Diagnosis not present

## 2018-04-19 DIAGNOSIS — M2550 Pain in unspecified joint: Secondary | ICD-10-CM | POA: Diagnosis not present

## 2018-04-19 DIAGNOSIS — G20 Parkinson's disease: Secondary | ICD-10-CM | POA: Diagnosis not present

## 2018-04-30 DIAGNOSIS — R293 Abnormal posture: Secondary | ICD-10-CM | POA: Diagnosis not present

## 2018-04-30 DIAGNOSIS — R262 Difficulty in walking, not elsewhere classified: Secondary | ICD-10-CM | POA: Diagnosis not present

## 2018-04-30 DIAGNOSIS — G20 Parkinson's disease: Secondary | ICD-10-CM | POA: Diagnosis not present

## 2018-04-30 DIAGNOSIS — R2689 Other abnormalities of gait and mobility: Secondary | ICD-10-CM | POA: Diagnosis not present

## 2018-04-30 DIAGNOSIS — M2560 Stiffness of unspecified joint, not elsewhere classified: Secondary | ICD-10-CM | POA: Diagnosis not present

## 2018-04-30 DIAGNOSIS — M2550 Pain in unspecified joint: Secondary | ICD-10-CM | POA: Diagnosis not present

## 2018-05-02 DIAGNOSIS — R262 Difficulty in walking, not elsewhere classified: Secondary | ICD-10-CM | POA: Diagnosis not present

## 2018-05-02 DIAGNOSIS — M2560 Stiffness of unspecified joint, not elsewhere classified: Secondary | ICD-10-CM | POA: Diagnosis not present

## 2018-05-02 DIAGNOSIS — R293 Abnormal posture: Secondary | ICD-10-CM | POA: Diagnosis not present

## 2018-05-02 DIAGNOSIS — G20 Parkinson's disease: Secondary | ICD-10-CM | POA: Diagnosis not present

## 2018-05-02 DIAGNOSIS — R2689 Other abnormalities of gait and mobility: Secondary | ICD-10-CM | POA: Diagnosis not present

## 2018-05-02 DIAGNOSIS — M2550 Pain in unspecified joint: Secondary | ICD-10-CM | POA: Diagnosis not present

## 2018-05-06 DIAGNOSIS — G4733 Obstructive sleep apnea (adult) (pediatric): Secondary | ICD-10-CM | POA: Diagnosis not present

## 2018-05-06 DIAGNOSIS — G4739 Other sleep apnea: Secondary | ICD-10-CM | POA: Diagnosis not present

## 2018-05-29 DIAGNOSIS — M961 Postlaminectomy syndrome, not elsewhere classified: Secondary | ICD-10-CM | POA: Diagnosis not present

## 2018-05-29 DIAGNOSIS — M47896 Other spondylosis, lumbar region: Secondary | ICD-10-CM | POA: Diagnosis not present

## 2018-05-29 DIAGNOSIS — M549 Dorsalgia, unspecified: Secondary | ICD-10-CM | POA: Diagnosis not present

## 2018-05-29 DIAGNOSIS — M545 Low back pain: Secondary | ICD-10-CM | POA: Diagnosis not present

## 2018-07-02 DIAGNOSIS — G4733 Obstructive sleep apnea (adult) (pediatric): Secondary | ICD-10-CM | POA: Diagnosis not present

## 2018-07-15 DIAGNOSIS — R269 Unspecified abnormalities of gait and mobility: Secondary | ICD-10-CM | POA: Diagnosis not present

## 2018-07-15 DIAGNOSIS — G4733 Obstructive sleep apnea (adult) (pediatric): Secondary | ICD-10-CM | POA: Diagnosis not present

## 2018-07-15 DIAGNOSIS — E119 Type 2 diabetes mellitus without complications: Secondary | ICD-10-CM | POA: Diagnosis not present

## 2018-07-15 DIAGNOSIS — G219 Secondary parkinsonism, unspecified: Secondary | ICD-10-CM | POA: Diagnosis not present

## 2018-07-15 DIAGNOSIS — F319 Bipolar disorder, unspecified: Secondary | ICD-10-CM | POA: Diagnosis not present

## 2018-07-15 DIAGNOSIS — G63 Polyneuropathy in diseases classified elsewhere: Secondary | ICD-10-CM | POA: Diagnosis not present

## 2018-07-15 DIAGNOSIS — F0280 Dementia in other diseases classified elsewhere without behavioral disturbance: Secondary | ICD-10-CM | POA: Diagnosis not present

## 2018-07-15 DIAGNOSIS — G20 Parkinson's disease: Secondary | ICD-10-CM | POA: Diagnosis not present

## 2018-07-22 DIAGNOSIS — G4733 Obstructive sleep apnea (adult) (pediatric): Secondary | ICD-10-CM | POA: Diagnosis not present

## 2018-07-26 DIAGNOSIS — M961 Postlaminectomy syndrome, not elsewhere classified: Secondary | ICD-10-CM | POA: Diagnosis not present

## 2018-07-26 DIAGNOSIS — M532X6 Spinal instabilities, lumbar region: Secondary | ICD-10-CM | POA: Diagnosis not present

## 2018-07-26 DIAGNOSIS — M48062 Spinal stenosis, lumbar region with neurogenic claudication: Secondary | ICD-10-CM | POA: Diagnosis not present

## 2018-07-26 DIAGNOSIS — M4716 Other spondylosis with myelopathy, lumbar region: Secondary | ICD-10-CM | POA: Diagnosis not present

## 2018-08-01 DIAGNOSIS — Z01812 Encounter for preprocedural laboratory examination: Secondary | ICD-10-CM | POA: Diagnosis not present

## 2018-08-01 DIAGNOSIS — R9431 Abnormal electrocardiogram [ECG] [EKG]: Secondary | ICD-10-CM | POA: Diagnosis not present

## 2018-08-01 DIAGNOSIS — R001 Bradycardia, unspecified: Secondary | ICD-10-CM | POA: Diagnosis not present

## 2018-08-01 DIAGNOSIS — Z01810 Encounter for preprocedural cardiovascular examination: Secondary | ICD-10-CM | POA: Diagnosis not present

## 2018-08-01 DIAGNOSIS — I499 Cardiac arrhythmia, unspecified: Secondary | ICD-10-CM | POA: Diagnosis not present

## 2018-08-01 DIAGNOSIS — M4800 Spinal stenosis, site unspecified: Secondary | ICD-10-CM | POA: Diagnosis not present

## 2018-08-06 DIAGNOSIS — G459 Transient cerebral ischemic attack, unspecified: Secondary | ICD-10-CM | POA: Diagnosis not present

## 2018-08-06 DIAGNOSIS — M5136 Other intervertebral disc degeneration, lumbar region: Secondary | ICD-10-CM | POA: Diagnosis not present

## 2018-08-06 DIAGNOSIS — M961 Postlaminectomy syndrome, not elsewhere classified: Secondary | ICD-10-CM | POA: Diagnosis not present

## 2018-08-06 DIAGNOSIS — M4726 Other spondylosis with radiculopathy, lumbar region: Secondary | ICD-10-CM | POA: Diagnosis not present

## 2018-08-06 DIAGNOSIS — Z981 Arthrodesis status: Secondary | ICD-10-CM | POA: Diagnosis not present

## 2018-08-06 DIAGNOSIS — M4306 Spondylolysis, lumbar region: Secondary | ICD-10-CM | POA: Diagnosis not present

## 2018-08-06 DIAGNOSIS — R279 Unspecified lack of coordination: Secondary | ICD-10-CM | POA: Diagnosis not present

## 2018-08-06 DIAGNOSIS — M532X6 Spinal instabilities, lumbar region: Secondary | ICD-10-CM | POA: Diagnosis not present

## 2018-08-06 DIAGNOSIS — M4716 Other spondylosis with myelopathy, lumbar region: Secondary | ICD-10-CM | POA: Diagnosis not present

## 2018-08-06 DIAGNOSIS — J45909 Unspecified asthma, uncomplicated: Secondary | ICD-10-CM | POA: Diagnosis not present

## 2018-08-06 DIAGNOSIS — M4326 Fusion of spine, lumbar region: Secondary | ICD-10-CM | POA: Diagnosis not present

## 2018-08-06 DIAGNOSIS — Z743 Need for continuous supervision: Secondary | ICD-10-CM | POA: Diagnosis not present

## 2018-08-06 DIAGNOSIS — E119 Type 2 diabetes mellitus without complications: Secondary | ICD-10-CM | POA: Diagnosis not present

## 2018-08-06 DIAGNOSIS — M5116 Intervertebral disc disorders with radiculopathy, lumbar region: Secondary | ICD-10-CM | POA: Diagnosis not present

## 2018-08-06 DIAGNOSIS — K219 Gastro-esophageal reflux disease without esophagitis: Secondary | ICD-10-CM | POA: Diagnosis not present

## 2018-08-06 DIAGNOSIS — M48061 Spinal stenosis, lumbar region without neurogenic claudication: Secondary | ICD-10-CM | POA: Diagnosis not present

## 2018-08-06 DIAGNOSIS — G20 Parkinson's disease: Secondary | ICD-10-CM | POA: Diagnosis not present

## 2018-08-06 DIAGNOSIS — I10 Essential (primary) hypertension: Secondary | ICD-10-CM | POA: Diagnosis not present

## 2018-08-06 DIAGNOSIS — M48062 Spinal stenosis, lumbar region with neurogenic claudication: Secondary | ICD-10-CM | POA: Diagnosis not present

## 2018-08-13 DIAGNOSIS — M4306 Spondylolysis, lumbar region: Secondary | ICD-10-CM | POA: Diagnosis not present

## 2018-08-13 DIAGNOSIS — M532X6 Spinal instabilities, lumbar region: Secondary | ICD-10-CM | POA: Diagnosis not present

## 2018-08-13 DIAGNOSIS — G4733 Obstructive sleep apnea (adult) (pediatric): Secondary | ICD-10-CM | POA: Diagnosis not present

## 2018-08-13 DIAGNOSIS — M4716 Other spondylosis with myelopathy, lumbar region: Secondary | ICD-10-CM | POA: Diagnosis not present

## 2018-08-13 DIAGNOSIS — G459 Transient cerebral ischemic attack, unspecified: Secondary | ICD-10-CM | POA: Diagnosis not present

## 2018-08-13 DIAGNOSIS — M4326 Fusion of spine, lumbar region: Secondary | ICD-10-CM | POA: Diagnosis not present

## 2018-08-13 DIAGNOSIS — I10 Essential (primary) hypertension: Secondary | ICD-10-CM | POA: Diagnosis not present

## 2018-08-13 DIAGNOSIS — R262 Difficulty in walking, not elsewhere classified: Secondary | ICD-10-CM | POA: Diagnosis not present

## 2018-08-13 DIAGNOSIS — J45909 Unspecified asthma, uncomplicated: Secondary | ICD-10-CM | POA: Diagnosis not present

## 2018-08-13 DIAGNOSIS — R279 Unspecified lack of coordination: Secondary | ICD-10-CM | POA: Diagnosis not present

## 2018-08-13 DIAGNOSIS — D649 Anemia, unspecified: Secondary | ICD-10-CM | POA: Diagnosis not present

## 2018-08-13 DIAGNOSIS — E119 Type 2 diabetes mellitus without complications: Secondary | ICD-10-CM | POA: Diagnosis not present

## 2018-08-13 DIAGNOSIS — M48061 Spinal stenosis, lumbar region without neurogenic claudication: Secondary | ICD-10-CM | POA: Diagnosis not present

## 2018-08-13 DIAGNOSIS — Z743 Need for continuous supervision: Secondary | ICD-10-CM | POA: Diagnosis not present

## 2018-08-13 DIAGNOSIS — M48062 Spinal stenosis, lumbar region with neurogenic claudication: Secondary | ICD-10-CM | POA: Diagnosis not present

## 2018-08-13 DIAGNOSIS — M5136 Other intervertebral disc degeneration, lumbar region: Secondary | ICD-10-CM | POA: Diagnosis not present

## 2018-08-13 DIAGNOSIS — G20 Parkinson's disease: Secondary | ICD-10-CM | POA: Diagnosis not present

## 2018-08-13 DIAGNOSIS — G8918 Other acute postprocedural pain: Secondary | ICD-10-CM | POA: Diagnosis not present

## 2018-08-21 DIAGNOSIS — D649 Anemia, unspecified: Secondary | ICD-10-CM | POA: Diagnosis not present

## 2018-08-21 DIAGNOSIS — M4326 Fusion of spine, lumbar region: Secondary | ICD-10-CM | POA: Diagnosis not present

## 2018-08-21 DIAGNOSIS — G8918 Other acute postprocedural pain: Secondary | ICD-10-CM | POA: Diagnosis not present

## 2018-08-21 DIAGNOSIS — R262 Difficulty in walking, not elsewhere classified: Secondary | ICD-10-CM | POA: Diagnosis not present

## 2018-09-02 DIAGNOSIS — M48062 Spinal stenosis, lumbar region with neurogenic claudication: Secondary | ICD-10-CM | POA: Diagnosis not present

## 2018-09-04 ENCOUNTER — Other Ambulatory Visit: Payer: Self-pay

## 2018-09-04 DIAGNOSIS — G40909 Epilepsy, unspecified, not intractable, without status epilepticus: Secondary | ICD-10-CM | POA: Diagnosis not present

## 2018-09-04 DIAGNOSIS — E119 Type 2 diabetes mellitus without complications: Secondary | ICD-10-CM | POA: Diagnosis not present

## 2018-09-04 DIAGNOSIS — I10 Essential (primary) hypertension: Secondary | ICD-10-CM | POA: Diagnosis not present

## 2018-09-04 DIAGNOSIS — M4306 Spondylolysis, lumbar region: Secondary | ICD-10-CM | POA: Diagnosis not present

## 2018-09-04 DIAGNOSIS — Z4789 Encounter for other orthopedic aftercare: Secondary | ICD-10-CM | POA: Diagnosis not present

## 2018-09-04 DIAGNOSIS — M48061 Spinal stenosis, lumbar region without neurogenic claudication: Secondary | ICD-10-CM | POA: Diagnosis not present

## 2018-09-04 DIAGNOSIS — G20 Parkinson's disease: Secondary | ICD-10-CM | POA: Diagnosis not present

## 2018-09-04 DIAGNOSIS — F0280 Dementia in other diseases classified elsewhere without behavioral disturbance: Secondary | ICD-10-CM | POA: Diagnosis not present

## 2018-09-04 DIAGNOSIS — M5136 Other intervertebral disc degeneration, lumbar region: Secondary | ICD-10-CM | POA: Diagnosis not present

## 2018-09-04 NOTE — Patient Outreach (Signed)
[facility]) Care Management  09/04/2018  [name] 08-09-1941 [id number]   Referral Date: 09/04/2018 Referral Source:  [employer] Report Date of Admission: 08/13/18 Diagnosis: Laminectomy Date of Discharge:09/02/2018 Facility: [place]:  [employer]  Outreach attempt: spoke with caregiver [name].  She is patient's HCPOA.  She states that patient lives with her as he has no one else to take care of him.  She states that patient cannot bathe himself or do much since surgery.  She expressed some frustration about the patient being discharged too soon but was told the insurance would not pay anymore.  Caregiver states she is limited to caring for patient due to her own health problems and disabled daughter that she cares for.  She states that home health is present doing the evaluation and patient to have nursing. PT, OT, and Aide.  She states that she gives patient medications but not in a position to review medications on call. Patient has seen the surgeon on 09-02-2018 and will see PCP next month and surgeon again next month.  She states that patient does have a brace on and is limited with no bending, twisting, or lifting.  She states patient has someone that takes patient to his appointments.    Patient has past medical history of diabetes, obstructive sleep apnea on CPAP, L4-5 laminectomy, asthma, Parkinson's disease with tremors, arthritis, chronic back pain, hypertension, hyperlipidemia, CVA, TIA, GERD, chronic anticoagulation, depression, anxiety, bipolar disorder and is on multiple medications.  Caregiver denies any problems managing at this time and delcines further needs at this time.     Plan: RN CM will close case.    Jone Baseman, RN, MSN [facility] Care Management Care Management Coordinator Direct Line [phone] Toll Free: [phone]  Fax: [phone]

## 2018-09-06 DIAGNOSIS — G40909 Epilepsy, unspecified, not intractable, without status epilepticus: Secondary | ICD-10-CM | POA: Diagnosis not present

## 2018-09-06 DIAGNOSIS — G20 Parkinson's disease: Secondary | ICD-10-CM | POA: Diagnosis not present

## 2018-09-06 DIAGNOSIS — M4306 Spondylolysis, lumbar region: Secondary | ICD-10-CM | POA: Diagnosis not present

## 2018-09-06 DIAGNOSIS — M48061 Spinal stenosis, lumbar region without neurogenic claudication: Secondary | ICD-10-CM | POA: Diagnosis not present

## 2018-09-06 DIAGNOSIS — F0280 Dementia in other diseases classified elsewhere without behavioral disturbance: Secondary | ICD-10-CM | POA: Diagnosis not present

## 2018-09-06 DIAGNOSIS — I10 Essential (primary) hypertension: Secondary | ICD-10-CM | POA: Diagnosis not present

## 2018-09-06 DIAGNOSIS — Z4789 Encounter for other orthopedic aftercare: Secondary | ICD-10-CM | POA: Diagnosis not present

## 2018-09-06 DIAGNOSIS — M5136 Other intervertebral disc degeneration, lumbar region: Secondary | ICD-10-CM | POA: Diagnosis not present

## 2018-09-06 DIAGNOSIS — E119 Type 2 diabetes mellitus without complications: Secondary | ICD-10-CM | POA: Diagnosis not present

## 2018-09-09 ENCOUNTER — Other Ambulatory Visit: Payer: Self-pay

## 2018-09-09 DIAGNOSIS — E119 Type 2 diabetes mellitus without complications: Secondary | ICD-10-CM | POA: Diagnosis not present

## 2018-09-09 DIAGNOSIS — G40909 Epilepsy, unspecified, not intractable, without status epilepticus: Secondary | ICD-10-CM | POA: Diagnosis not present

## 2018-09-09 DIAGNOSIS — F0280 Dementia in other diseases classified elsewhere without behavioral disturbance: Secondary | ICD-10-CM | POA: Diagnosis not present

## 2018-09-09 DIAGNOSIS — Z4789 Encounter for other orthopedic aftercare: Secondary | ICD-10-CM | POA: Diagnosis not present

## 2018-09-09 DIAGNOSIS — M5136 Other intervertebral disc degeneration, lumbar region: Secondary | ICD-10-CM | POA: Diagnosis not present

## 2018-09-09 DIAGNOSIS — M48061 Spinal stenosis, lumbar region without neurogenic claudication: Secondary | ICD-10-CM | POA: Diagnosis not present

## 2018-09-09 DIAGNOSIS — I10 Essential (primary) hypertension: Secondary | ICD-10-CM | POA: Diagnosis not present

## 2018-09-09 DIAGNOSIS — G20 Parkinson's disease: Secondary | ICD-10-CM | POA: Diagnosis not present

## 2018-09-09 DIAGNOSIS — M4306 Spondylolysis, lumbar region: Secondary | ICD-10-CM | POA: Diagnosis not present

## 2018-09-09 NOTE — Patient Outreach (Signed)
[facility]) Care Management  09/09/2018  JAZE RODINO [birth year] [id number]   EMMI- General Discharge RED ON EMMI ALERT Day # 4 Date: 09/07/2018 Red Alert Reason:  Lost interest in things? Yes  Sad/hopeless/anxious/empty? Yes    Outreach attempt: spoke with caregiver [name]. She is able to verify HIPAA.  Addressed red alerts with her.  She states that patient has a history of Bipolar and that patient is down but they encourage him to do things he needs to do. She feels that his depression is not changed or new where the physician needs to be involved and neither is patient suicidal. She states that home health has come in to assist with care and things are ok.  She denies any questions, concerns, or needs at this time.     Plan: RN CM will close case.    Jone Baseman, RN, MSN [facility] Care Management Care Management Coordinator Direct Line [phone] Toll Free: [phone]  Fax: [phone]

## 2018-09-10 DIAGNOSIS — G20 Parkinson's disease: Secondary | ICD-10-CM | POA: Diagnosis not present

## 2018-09-10 DIAGNOSIS — F0280 Dementia in other diseases classified elsewhere without behavioral disturbance: Secondary | ICD-10-CM | POA: Diagnosis not present

## 2018-09-10 DIAGNOSIS — E119 Type 2 diabetes mellitus without complications: Secondary | ICD-10-CM | POA: Diagnosis not present

## 2018-09-10 DIAGNOSIS — Z4789 Encounter for other orthopedic aftercare: Secondary | ICD-10-CM | POA: Diagnosis not present

## 2018-09-10 DIAGNOSIS — M5136 Other intervertebral disc degeneration, lumbar region: Secondary | ICD-10-CM | POA: Diagnosis not present

## 2018-09-10 DIAGNOSIS — M4306 Spondylolysis, lumbar region: Secondary | ICD-10-CM | POA: Diagnosis not present

## 2018-09-10 DIAGNOSIS — G40909 Epilepsy, unspecified, not intractable, without status epilepticus: Secondary | ICD-10-CM | POA: Diagnosis not present

## 2018-09-10 DIAGNOSIS — M48061 Spinal stenosis, lumbar region without neurogenic claudication: Secondary | ICD-10-CM | POA: Diagnosis not present

## 2018-09-10 DIAGNOSIS — I10 Essential (primary) hypertension: Secondary | ICD-10-CM | POA: Diagnosis not present

## 2018-09-12 DIAGNOSIS — M48061 Spinal stenosis, lumbar region without neurogenic claudication: Secondary | ICD-10-CM | POA: Diagnosis not present

## 2018-09-12 DIAGNOSIS — M5136 Other intervertebral disc degeneration, lumbar region: Secondary | ICD-10-CM | POA: Diagnosis not present

## 2018-09-12 DIAGNOSIS — Z4789 Encounter for other orthopedic aftercare: Secondary | ICD-10-CM | POA: Diagnosis not present

## 2018-09-12 DIAGNOSIS — I10 Essential (primary) hypertension: Secondary | ICD-10-CM | POA: Diagnosis not present

## 2018-09-12 DIAGNOSIS — E119 Type 2 diabetes mellitus without complications: Secondary | ICD-10-CM | POA: Diagnosis not present

## 2018-09-12 DIAGNOSIS — M4306 Spondylolysis, lumbar region: Secondary | ICD-10-CM | POA: Diagnosis not present

## 2018-09-12 DIAGNOSIS — G20 Parkinson's disease: Secondary | ICD-10-CM | POA: Diagnosis not present

## 2018-09-12 DIAGNOSIS — F0280 Dementia in other diseases classified elsewhere without behavioral disturbance: Secondary | ICD-10-CM | POA: Diagnosis not present

## 2018-09-12 DIAGNOSIS — G40909 Epilepsy, unspecified, not intractable, without status epilepticus: Secondary | ICD-10-CM | POA: Diagnosis not present

## 2018-09-13 DIAGNOSIS — G20 Parkinson's disease: Secondary | ICD-10-CM | POA: Diagnosis not present

## 2018-09-13 DIAGNOSIS — F0280 Dementia in other diseases classified elsewhere without behavioral disturbance: Secondary | ICD-10-CM | POA: Diagnosis not present

## 2018-09-13 DIAGNOSIS — Z4789 Encounter for other orthopedic aftercare: Secondary | ICD-10-CM | POA: Diagnosis not present

## 2018-09-13 DIAGNOSIS — M5136 Other intervertebral disc degeneration, lumbar region: Secondary | ICD-10-CM | POA: Diagnosis not present

## 2018-09-13 DIAGNOSIS — G40909 Epilepsy, unspecified, not intractable, without status epilepticus: Secondary | ICD-10-CM | POA: Diagnosis not present

## 2018-09-13 DIAGNOSIS — M4306 Spondylolysis, lumbar region: Secondary | ICD-10-CM | POA: Diagnosis not present

## 2018-09-13 DIAGNOSIS — M48061 Spinal stenosis, lumbar region without neurogenic claudication: Secondary | ICD-10-CM | POA: Diagnosis not present

## 2018-09-13 DIAGNOSIS — E119 Type 2 diabetes mellitus without complications: Secondary | ICD-10-CM | POA: Diagnosis not present

## 2018-09-13 DIAGNOSIS — I10 Essential (primary) hypertension: Secondary | ICD-10-CM | POA: Diagnosis not present

## 2018-09-16 DIAGNOSIS — G20 Parkinson's disease: Secondary | ICD-10-CM | POA: Diagnosis not present

## 2018-09-16 DIAGNOSIS — I10 Essential (primary) hypertension: Secondary | ICD-10-CM | POA: Diagnosis not present

## 2018-09-16 DIAGNOSIS — G40909 Epilepsy, unspecified, not intractable, without status epilepticus: Secondary | ICD-10-CM | POA: Diagnosis not present

## 2018-09-16 DIAGNOSIS — M4306 Spondylolysis, lumbar region: Secondary | ICD-10-CM | POA: Diagnosis not present

## 2018-09-16 DIAGNOSIS — F0280 Dementia in other diseases classified elsewhere without behavioral disturbance: Secondary | ICD-10-CM | POA: Diagnosis not present

## 2018-09-16 DIAGNOSIS — E119 Type 2 diabetes mellitus without complications: Secondary | ICD-10-CM | POA: Diagnosis not present

## 2018-09-16 DIAGNOSIS — M5136 Other intervertebral disc degeneration, lumbar region: Secondary | ICD-10-CM | POA: Diagnosis not present

## 2018-09-16 DIAGNOSIS — M48061 Spinal stenosis, lumbar region without neurogenic claudication: Secondary | ICD-10-CM | POA: Diagnosis not present

## 2018-09-16 DIAGNOSIS — Z4789 Encounter for other orthopedic aftercare: Secondary | ICD-10-CM | POA: Diagnosis not present

## 2018-09-17 DIAGNOSIS — M48061 Spinal stenosis, lumbar region without neurogenic claudication: Secondary | ICD-10-CM | POA: Diagnosis not present

## 2018-09-17 DIAGNOSIS — G40909 Epilepsy, unspecified, not intractable, without status epilepticus: Secondary | ICD-10-CM | POA: Diagnosis not present

## 2018-09-17 DIAGNOSIS — M5136 Other intervertebral disc degeneration, lumbar region: Secondary | ICD-10-CM | POA: Diagnosis not present

## 2018-09-17 DIAGNOSIS — M4306 Spondylolysis, lumbar region: Secondary | ICD-10-CM | POA: Diagnosis not present

## 2018-09-17 DIAGNOSIS — Z4789 Encounter for other orthopedic aftercare: Secondary | ICD-10-CM | POA: Diagnosis not present

## 2018-09-17 DIAGNOSIS — F0280 Dementia in other diseases classified elsewhere without behavioral disturbance: Secondary | ICD-10-CM | POA: Diagnosis not present

## 2018-09-17 DIAGNOSIS — I10 Essential (primary) hypertension: Secondary | ICD-10-CM | POA: Diagnosis not present

## 2018-09-17 DIAGNOSIS — E119 Type 2 diabetes mellitus without complications: Secondary | ICD-10-CM | POA: Diagnosis not present

## 2018-09-17 DIAGNOSIS — G20 Parkinson's disease: Secondary | ICD-10-CM | POA: Diagnosis not present

## 2018-09-19 DIAGNOSIS — M48061 Spinal stenosis, lumbar region without neurogenic claudication: Secondary | ICD-10-CM | POA: Diagnosis not present

## 2018-09-19 DIAGNOSIS — Z4789 Encounter for other orthopedic aftercare: Secondary | ICD-10-CM | POA: Diagnosis not present

## 2018-09-19 DIAGNOSIS — E119 Type 2 diabetes mellitus without complications: Secondary | ICD-10-CM | POA: Diagnosis not present

## 2018-09-19 DIAGNOSIS — G40909 Epilepsy, unspecified, not intractable, without status epilepticus: Secondary | ICD-10-CM | POA: Diagnosis not present

## 2018-09-19 DIAGNOSIS — M5136 Other intervertebral disc degeneration, lumbar region: Secondary | ICD-10-CM | POA: Diagnosis not present

## 2018-09-19 DIAGNOSIS — I10 Essential (primary) hypertension: Secondary | ICD-10-CM | POA: Diagnosis not present

## 2018-09-19 DIAGNOSIS — G20 Parkinson's disease: Secondary | ICD-10-CM | POA: Diagnosis not present

## 2018-09-19 DIAGNOSIS — M4306 Spondylolysis, lumbar region: Secondary | ICD-10-CM | POA: Diagnosis not present

## 2018-09-19 DIAGNOSIS — F0280 Dementia in other diseases classified elsewhere without behavioral disturbance: Secondary | ICD-10-CM | POA: Diagnosis not present

## 2018-09-20 DIAGNOSIS — F0280 Dementia in other diseases classified elsewhere without behavioral disturbance: Secondary | ICD-10-CM | POA: Diagnosis not present

## 2018-09-20 DIAGNOSIS — I10 Essential (primary) hypertension: Secondary | ICD-10-CM | POA: Diagnosis not present

## 2018-09-20 DIAGNOSIS — E119 Type 2 diabetes mellitus without complications: Secondary | ICD-10-CM | POA: Diagnosis not present

## 2018-09-20 DIAGNOSIS — M4306 Spondylolysis, lumbar region: Secondary | ICD-10-CM | POA: Diagnosis not present

## 2018-09-20 DIAGNOSIS — M48061 Spinal stenosis, lumbar region without neurogenic claudication: Secondary | ICD-10-CM | POA: Diagnosis not present

## 2018-09-20 DIAGNOSIS — Z4789 Encounter for other orthopedic aftercare: Secondary | ICD-10-CM | POA: Diagnosis not present

## 2018-09-20 DIAGNOSIS — G40909 Epilepsy, unspecified, not intractable, without status epilepticus: Secondary | ICD-10-CM | POA: Diagnosis not present

## 2018-09-20 DIAGNOSIS — G20 Parkinson's disease: Secondary | ICD-10-CM | POA: Diagnosis not present

## 2018-09-20 DIAGNOSIS — M5136 Other intervertebral disc degeneration, lumbar region: Secondary | ICD-10-CM | POA: Diagnosis not present

## 2018-09-22 DIAGNOSIS — G4733 Obstructive sleep apnea (adult) (pediatric): Secondary | ICD-10-CM | POA: Diagnosis not present

## 2018-09-23 DIAGNOSIS — M48061 Spinal stenosis, lumbar region without neurogenic claudication: Secondary | ICD-10-CM | POA: Diagnosis not present

## 2018-09-23 DIAGNOSIS — F0280 Dementia in other diseases classified elsewhere without behavioral disturbance: Secondary | ICD-10-CM | POA: Diagnosis not present

## 2018-09-23 DIAGNOSIS — M5136 Other intervertebral disc degeneration, lumbar region: Secondary | ICD-10-CM | POA: Diagnosis not present

## 2018-09-23 DIAGNOSIS — I10 Essential (primary) hypertension: Secondary | ICD-10-CM | POA: Diagnosis not present

## 2018-09-23 DIAGNOSIS — E119 Type 2 diabetes mellitus without complications: Secondary | ICD-10-CM | POA: Diagnosis not present

## 2018-09-23 DIAGNOSIS — G20 Parkinson's disease: Secondary | ICD-10-CM | POA: Diagnosis not present

## 2018-09-23 DIAGNOSIS — Z4789 Encounter for other orthopedic aftercare: Secondary | ICD-10-CM | POA: Diagnosis not present

## 2018-09-23 DIAGNOSIS — G40909 Epilepsy, unspecified, not intractable, without status epilepticus: Secondary | ICD-10-CM | POA: Diagnosis not present

## 2018-09-23 DIAGNOSIS — M4306 Spondylolysis, lumbar region: Secondary | ICD-10-CM | POA: Diagnosis not present

## 2018-09-24 DIAGNOSIS — G40909 Epilepsy, unspecified, not intractable, without status epilepticus: Secondary | ICD-10-CM | POA: Diagnosis not present

## 2018-09-24 DIAGNOSIS — F0280 Dementia in other diseases classified elsewhere without behavioral disturbance: Secondary | ICD-10-CM | POA: Diagnosis not present

## 2018-09-24 DIAGNOSIS — I10 Essential (primary) hypertension: Secondary | ICD-10-CM | POA: Diagnosis not present

## 2018-09-24 DIAGNOSIS — M4306 Spondylolysis, lumbar region: Secondary | ICD-10-CM | POA: Diagnosis not present

## 2018-09-24 DIAGNOSIS — G20 Parkinson's disease: Secondary | ICD-10-CM | POA: Diagnosis not present

## 2018-09-24 DIAGNOSIS — M48061 Spinal stenosis, lumbar region without neurogenic claudication: Secondary | ICD-10-CM | POA: Diagnosis not present

## 2018-09-24 DIAGNOSIS — E119 Type 2 diabetes mellitus without complications: Secondary | ICD-10-CM | POA: Diagnosis not present

## 2018-09-24 DIAGNOSIS — Z4789 Encounter for other orthopedic aftercare: Secondary | ICD-10-CM | POA: Diagnosis not present

## 2018-09-24 DIAGNOSIS — M5136 Other intervertebral disc degeneration, lumbar region: Secondary | ICD-10-CM | POA: Diagnosis not present

## 2018-09-25 DIAGNOSIS — I10 Essential (primary) hypertension: Secondary | ICD-10-CM | POA: Diagnosis not present

## 2018-09-25 DIAGNOSIS — G20 Parkinson's disease: Secondary | ICD-10-CM | POA: Diagnosis not present

## 2018-09-25 DIAGNOSIS — M4306 Spondylolysis, lumbar region: Secondary | ICD-10-CM | POA: Diagnosis not present

## 2018-09-25 DIAGNOSIS — Z4789 Encounter for other orthopedic aftercare: Secondary | ICD-10-CM | POA: Diagnosis not present

## 2018-09-25 DIAGNOSIS — M48061 Spinal stenosis, lumbar region without neurogenic claudication: Secondary | ICD-10-CM | POA: Diagnosis not present

## 2018-09-25 DIAGNOSIS — E119 Type 2 diabetes mellitus without complications: Secondary | ICD-10-CM | POA: Diagnosis not present

## 2018-09-25 DIAGNOSIS — F0280 Dementia in other diseases classified elsewhere without behavioral disturbance: Secondary | ICD-10-CM | POA: Diagnosis not present

## 2018-09-25 DIAGNOSIS — G40909 Epilepsy, unspecified, not intractable, without status epilepticus: Secondary | ICD-10-CM | POA: Diagnosis not present

## 2018-09-25 DIAGNOSIS — M5136 Other intervertebral disc degeneration, lumbar region: Secondary | ICD-10-CM | POA: Diagnosis not present

## 2018-09-26 DIAGNOSIS — E119 Type 2 diabetes mellitus without complications: Secondary | ICD-10-CM | POA: Diagnosis not present

## 2018-09-26 DIAGNOSIS — Z4789 Encounter for other orthopedic aftercare: Secondary | ICD-10-CM | POA: Diagnosis not present

## 2018-09-26 DIAGNOSIS — F0280 Dementia in other diseases classified elsewhere without behavioral disturbance: Secondary | ICD-10-CM | POA: Diagnosis not present

## 2018-09-26 DIAGNOSIS — G40909 Epilepsy, unspecified, not intractable, without status epilepticus: Secondary | ICD-10-CM | POA: Diagnosis not present

## 2018-09-26 DIAGNOSIS — M4306 Spondylolysis, lumbar region: Secondary | ICD-10-CM | POA: Diagnosis not present

## 2018-09-26 DIAGNOSIS — M48061 Spinal stenosis, lumbar region without neurogenic claudication: Secondary | ICD-10-CM | POA: Diagnosis not present

## 2018-09-26 DIAGNOSIS — I10 Essential (primary) hypertension: Secondary | ICD-10-CM | POA: Diagnosis not present

## 2018-09-26 DIAGNOSIS — M5136 Other intervertebral disc degeneration, lumbar region: Secondary | ICD-10-CM | POA: Diagnosis not present

## 2018-09-26 DIAGNOSIS — G20 Parkinson's disease: Secondary | ICD-10-CM | POA: Diagnosis not present

## 2018-09-27 DIAGNOSIS — M48061 Spinal stenosis, lumbar region without neurogenic claudication: Secondary | ICD-10-CM | POA: Diagnosis not present

## 2018-09-27 DIAGNOSIS — F0280 Dementia in other diseases classified elsewhere without behavioral disturbance: Secondary | ICD-10-CM | POA: Diagnosis not present

## 2018-09-27 DIAGNOSIS — G40909 Epilepsy, unspecified, not intractable, without status epilepticus: Secondary | ICD-10-CM | POA: Diagnosis not present

## 2018-09-27 DIAGNOSIS — M5136 Other intervertebral disc degeneration, lumbar region: Secondary | ICD-10-CM | POA: Diagnosis not present

## 2018-09-27 DIAGNOSIS — G20 Parkinson's disease: Secondary | ICD-10-CM | POA: Diagnosis not present

## 2018-09-27 DIAGNOSIS — E119 Type 2 diabetes mellitus without complications: Secondary | ICD-10-CM | POA: Diagnosis not present

## 2018-09-27 DIAGNOSIS — Z4789 Encounter for other orthopedic aftercare: Secondary | ICD-10-CM | POA: Diagnosis not present

## 2018-09-27 DIAGNOSIS — I10 Essential (primary) hypertension: Secondary | ICD-10-CM | POA: Diagnosis not present

## 2018-09-27 DIAGNOSIS — M4306 Spondylolysis, lumbar region: Secondary | ICD-10-CM | POA: Diagnosis not present

## 2018-10-01 DIAGNOSIS — G40909 Epilepsy, unspecified, not intractable, without status epilepticus: Secondary | ICD-10-CM | POA: Diagnosis not present

## 2018-10-01 DIAGNOSIS — I10 Essential (primary) hypertension: Secondary | ICD-10-CM | POA: Diagnosis not present

## 2018-10-01 DIAGNOSIS — M4306 Spondylolysis, lumbar region: Secondary | ICD-10-CM | POA: Diagnosis not present

## 2018-10-01 DIAGNOSIS — M48061 Spinal stenosis, lumbar region without neurogenic claudication: Secondary | ICD-10-CM | POA: Diagnosis not present

## 2018-10-01 DIAGNOSIS — E119 Type 2 diabetes mellitus without complications: Secondary | ICD-10-CM | POA: Diagnosis not present

## 2018-10-01 DIAGNOSIS — G20 Parkinson's disease: Secondary | ICD-10-CM | POA: Diagnosis not present

## 2018-10-01 DIAGNOSIS — M5136 Other intervertebral disc degeneration, lumbar region: Secondary | ICD-10-CM | POA: Diagnosis not present

## 2018-10-01 DIAGNOSIS — Z4789 Encounter for other orthopedic aftercare: Secondary | ICD-10-CM | POA: Diagnosis not present

## 2018-10-01 DIAGNOSIS — F0280 Dementia in other diseases classified elsewhere without behavioral disturbance: Secondary | ICD-10-CM | POA: Diagnosis not present

## 2018-10-02 DIAGNOSIS — E1129 Type 2 diabetes mellitus with other diabetic kidney complication: Secondary | ICD-10-CM | POA: Diagnosis not present

## 2018-10-02 DIAGNOSIS — F319 Bipolar disorder, unspecified: Secondary | ICD-10-CM | POA: Diagnosis not present

## 2018-10-02 DIAGNOSIS — M545 Low back pain: Secondary | ICD-10-CM | POA: Diagnosis not present

## 2018-10-02 DIAGNOSIS — I10 Essential (primary) hypertension: Secondary | ICD-10-CM | POA: Diagnosis not present

## 2018-10-02 DIAGNOSIS — E7849 Other hyperlipidemia: Secondary | ICD-10-CM | POA: Diagnosis not present

## 2018-10-02 DIAGNOSIS — G3109 Other frontotemporal dementia: Secondary | ICD-10-CM | POA: Diagnosis not present

## 2018-10-02 DIAGNOSIS — G8929 Other chronic pain: Secondary | ICD-10-CM | POA: Diagnosis not present

## 2018-10-02 DIAGNOSIS — Z6827 Body mass index (BMI) 27.0-27.9, adult: Secondary | ICD-10-CM | POA: Diagnosis not present

## 2018-10-03 DIAGNOSIS — F0280 Dementia in other diseases classified elsewhere without behavioral disturbance: Secondary | ICD-10-CM | POA: Diagnosis not present

## 2018-10-03 DIAGNOSIS — E119 Type 2 diabetes mellitus without complications: Secondary | ICD-10-CM | POA: Diagnosis not present

## 2018-10-03 DIAGNOSIS — M4306 Spondylolysis, lumbar region: Secondary | ICD-10-CM | POA: Diagnosis not present

## 2018-10-03 DIAGNOSIS — G20 Parkinson's disease: Secondary | ICD-10-CM | POA: Diagnosis not present

## 2018-10-03 DIAGNOSIS — M48062 Spinal stenosis, lumbar region with neurogenic claudication: Secondary | ICD-10-CM | POA: Diagnosis not present

## 2018-10-03 DIAGNOSIS — G40909 Epilepsy, unspecified, not intractable, without status epilepticus: Secondary | ICD-10-CM | POA: Diagnosis not present

## 2018-10-03 DIAGNOSIS — M48061 Spinal stenosis, lumbar region without neurogenic claudication: Secondary | ICD-10-CM | POA: Diagnosis not present

## 2018-10-03 DIAGNOSIS — M5136 Other intervertebral disc degeneration, lumbar region: Secondary | ICD-10-CM | POA: Diagnosis not present

## 2018-10-03 DIAGNOSIS — Z4789 Encounter for other orthopedic aftercare: Secondary | ICD-10-CM | POA: Diagnosis not present

## 2018-10-03 DIAGNOSIS — I10 Essential (primary) hypertension: Secondary | ICD-10-CM | POA: Diagnosis not present

## 2018-10-07 DIAGNOSIS — M5136 Other intervertebral disc degeneration, lumbar region: Secondary | ICD-10-CM | POA: Diagnosis not present

## 2018-10-07 DIAGNOSIS — E119 Type 2 diabetes mellitus without complications: Secondary | ICD-10-CM | POA: Diagnosis not present

## 2018-10-07 DIAGNOSIS — G20 Parkinson's disease: Secondary | ICD-10-CM | POA: Diagnosis not present

## 2018-10-07 DIAGNOSIS — I10 Essential (primary) hypertension: Secondary | ICD-10-CM | POA: Diagnosis not present

## 2018-10-07 DIAGNOSIS — M48061 Spinal stenosis, lumbar region without neurogenic claudication: Secondary | ICD-10-CM | POA: Diagnosis not present

## 2018-10-07 DIAGNOSIS — Z4789 Encounter for other orthopedic aftercare: Secondary | ICD-10-CM | POA: Diagnosis not present

## 2018-10-07 DIAGNOSIS — M4306 Spondylolysis, lumbar region: Secondary | ICD-10-CM | POA: Diagnosis not present

## 2018-10-07 DIAGNOSIS — F0280 Dementia in other diseases classified elsewhere without behavioral disturbance: Secondary | ICD-10-CM | POA: Diagnosis not present

## 2018-10-07 DIAGNOSIS — G40909 Epilepsy, unspecified, not intractable, without status epilepticus: Secondary | ICD-10-CM | POA: Diagnosis not present

## 2018-10-08 ENCOUNTER — Other Ambulatory Visit: Payer: Self-pay | Admitting: Gastroenterology

## 2018-10-09 DIAGNOSIS — G40909 Epilepsy, unspecified, not intractable, without status epilepticus: Secondary | ICD-10-CM | POA: Diagnosis not present

## 2018-10-09 DIAGNOSIS — I10 Essential (primary) hypertension: Secondary | ICD-10-CM | POA: Diagnosis not present

## 2018-10-09 DIAGNOSIS — Z4789 Encounter for other orthopedic aftercare: Secondary | ICD-10-CM | POA: Diagnosis not present

## 2018-10-09 DIAGNOSIS — E119 Type 2 diabetes mellitus without complications: Secondary | ICD-10-CM | POA: Diagnosis not present

## 2018-10-09 DIAGNOSIS — F0280 Dementia in other diseases classified elsewhere without behavioral disturbance: Secondary | ICD-10-CM | POA: Diagnosis not present

## 2018-10-09 DIAGNOSIS — M48061 Spinal stenosis, lumbar region without neurogenic claudication: Secondary | ICD-10-CM | POA: Diagnosis not present

## 2018-10-09 DIAGNOSIS — G20 Parkinson's disease: Secondary | ICD-10-CM | POA: Diagnosis not present

## 2018-10-09 DIAGNOSIS — M5136 Other intervertebral disc degeneration, lumbar region: Secondary | ICD-10-CM | POA: Diagnosis not present

## 2018-10-09 DIAGNOSIS — M4306 Spondylolysis, lumbar region: Secondary | ICD-10-CM | POA: Diagnosis not present

## 2018-10-09 NOTE — Telephone Encounter (Signed)
Please address

## 2018-10-17 DIAGNOSIS — F0280 Dementia in other diseases classified elsewhere without behavioral disturbance: Secondary | ICD-10-CM | POA: Diagnosis not present

## 2018-10-17 DIAGNOSIS — M4306 Spondylolysis, lumbar region: Secondary | ICD-10-CM | POA: Diagnosis not present

## 2018-10-17 DIAGNOSIS — Z4789 Encounter for other orthopedic aftercare: Secondary | ICD-10-CM | POA: Diagnosis not present

## 2018-10-17 DIAGNOSIS — G40909 Epilepsy, unspecified, not intractable, without status epilepticus: Secondary | ICD-10-CM | POA: Diagnosis not present

## 2018-10-17 DIAGNOSIS — E119 Type 2 diabetes mellitus without complications: Secondary | ICD-10-CM | POA: Diagnosis not present

## 2018-10-17 DIAGNOSIS — I10 Essential (primary) hypertension: Secondary | ICD-10-CM | POA: Diagnosis not present

## 2018-10-17 DIAGNOSIS — M5136 Other intervertebral disc degeneration, lumbar region: Secondary | ICD-10-CM | POA: Diagnosis not present

## 2018-10-17 DIAGNOSIS — G20 Parkinson's disease: Secondary | ICD-10-CM | POA: Diagnosis not present

## 2018-10-17 DIAGNOSIS — M48061 Spinal stenosis, lumbar region without neurogenic claudication: Secondary | ICD-10-CM | POA: Diagnosis not present

## 2018-10-18 DIAGNOSIS — G20 Parkinson's disease: Secondary | ICD-10-CM | POA: Diagnosis not present

## 2018-10-18 DIAGNOSIS — G40909 Epilepsy, unspecified, not intractable, without status epilepticus: Secondary | ICD-10-CM | POA: Diagnosis not present

## 2018-10-18 DIAGNOSIS — Z4789 Encounter for other orthopedic aftercare: Secondary | ICD-10-CM | POA: Diagnosis not present

## 2018-10-18 DIAGNOSIS — M4306 Spondylolysis, lumbar region: Secondary | ICD-10-CM | POA: Diagnosis not present

## 2018-10-18 DIAGNOSIS — E119 Type 2 diabetes mellitus without complications: Secondary | ICD-10-CM | POA: Diagnosis not present

## 2018-10-18 DIAGNOSIS — M48061 Spinal stenosis, lumbar region without neurogenic claudication: Secondary | ICD-10-CM | POA: Diagnosis not present

## 2018-10-18 DIAGNOSIS — F0280 Dementia in other diseases classified elsewhere without behavioral disturbance: Secondary | ICD-10-CM | POA: Diagnosis not present

## 2018-10-18 DIAGNOSIS — I10 Essential (primary) hypertension: Secondary | ICD-10-CM | POA: Diagnosis not present

## 2018-10-18 DIAGNOSIS — M5136 Other intervertebral disc degeneration, lumbar region: Secondary | ICD-10-CM | POA: Diagnosis not present

## 2018-10-23 DIAGNOSIS — M5136 Other intervertebral disc degeneration, lumbar region: Secondary | ICD-10-CM | POA: Diagnosis not present

## 2018-10-23 DIAGNOSIS — G40909 Epilepsy, unspecified, not intractable, without status epilepticus: Secondary | ICD-10-CM | POA: Diagnosis not present

## 2018-10-23 DIAGNOSIS — M48061 Spinal stenosis, lumbar region without neurogenic claudication: Secondary | ICD-10-CM | POA: Diagnosis not present

## 2018-10-23 DIAGNOSIS — Z4789 Encounter for other orthopedic aftercare: Secondary | ICD-10-CM | POA: Diagnosis not present

## 2018-10-23 DIAGNOSIS — I10 Essential (primary) hypertension: Secondary | ICD-10-CM | POA: Diagnosis not present

## 2018-10-23 DIAGNOSIS — G20 Parkinson's disease: Secondary | ICD-10-CM | POA: Diagnosis not present

## 2018-10-23 DIAGNOSIS — M4306 Spondylolysis, lumbar region: Secondary | ICD-10-CM | POA: Diagnosis not present

## 2018-10-23 DIAGNOSIS — E119 Type 2 diabetes mellitus without complications: Secondary | ICD-10-CM | POA: Diagnosis not present

## 2018-10-23 DIAGNOSIS — F0280 Dementia in other diseases classified elsewhere without behavioral disturbance: Secondary | ICD-10-CM | POA: Diagnosis not present

## 2018-11-19 ENCOUNTER — Encounter: Payer: Self-pay | Admitting: Adult Health

## 2018-11-28 NOTE — Progress Notes (Signed)
This encounter was created in error - please disregard.

## 2019-01-16 DIAGNOSIS — M545 Low back pain: Secondary | ICD-10-CM | POA: Diagnosis not present

## 2019-01-16 DIAGNOSIS — M4326 Fusion of spine, lumbar region: Secondary | ICD-10-CM | POA: Diagnosis not present

## 2019-01-16 DIAGNOSIS — M47896 Other spondylosis, lumbar region: Secondary | ICD-10-CM | POA: Diagnosis not present

## 2019-02-07 DIAGNOSIS — Z7409 Other reduced mobility: Secondary | ICD-10-CM | POA: Insufficient documentation

## 2019-02-10 ENCOUNTER — Other Ambulatory Visit: Payer: Self-pay

## 2019-02-10 ENCOUNTER — Encounter: Payer: Self-pay | Admitting: Podiatry

## 2019-02-10 ENCOUNTER — Ambulatory Visit (INDEPENDENT_AMBULATORY_CARE_PROVIDER_SITE_OTHER): Payer: Medicare HMO | Admitting: Podiatry

## 2019-02-10 VITALS — Temp 96.5°F | Resp 16

## 2019-02-10 DIAGNOSIS — M79609 Pain in unspecified limb: Secondary | ICD-10-CM

## 2019-02-10 DIAGNOSIS — B351 Tinea unguium: Secondary | ICD-10-CM

## 2019-02-10 DIAGNOSIS — E119 Type 2 diabetes mellitus without complications: Secondary | ICD-10-CM

## 2019-02-10 DIAGNOSIS — J449 Chronic obstructive pulmonary disease, unspecified: Secondary | ICD-10-CM | POA: Diagnosis not present

## 2019-02-10 DIAGNOSIS — D689 Coagulation defect, unspecified: Secondary | ICD-10-CM

## 2019-02-10 NOTE — Progress Notes (Signed)
Subjective:  Patient ID: [name], male    DOB: Feb 12, 1941,  MRN: [id number]  Chief Complaint  Patient presents with  . debride    Diabetic nail trimming -pt states toenails hurts with socks on -pt denies any other pedal complaints -pt denies N/V/F/Ch   . diabetic shoes    Pt interested in Diabetic shoes  . Diabetes    FBS: 100 A1C: 4.5 PCP: Brigitte Pulse x December    77 y.o. male presents with the above complaint.  Reports painfully elongated nails to both feet.  Review of Systems: Negative except as noted in the HPI. Denies N/V/F/Ch.  Past Medical History:  Diagnosis Date  . Anxiety    Xanax as needed  . Arthritis    spinal stenosis.  . Asthma   . Bipolar disorder ([place])   . Cataract   . Chronic kidney disease    since NSAID's stopped -function has improved  . COPD (chronic obstructive pulmonary disease) ([place])   . Crohn's colitis ([place])   . Depression    takes Cymbalta daily  . Diverticulosis 01/2007  . DM (diabetes mellitus) ([place])    takes Januvia now, metformin stopped due to diarrhea  . ED (erectile dysfunction)   . GERD (gastroesophageal reflux disease)    uses Tums  . Glaucoma    surgery to correct  . History of colon polyps   . HLD (hyperlipidemia)    takes Lipitor daily  . HTN (hypertension)    takes Altace and Diovan daily  . Impaired hearing    "getting this checked out on Monday"  . Insomnia   . Internal hemorrhoids 01/2007  . Joint pain   . Joint swelling   . Microscopic colitis   . Nocturia   . Obesity   . Pneumonia 3/09  . Sleep apnea    uses CPAP;has been greater than 25yrs since sleep study- Dr. Elsworth Soho    Current Outpatient Medications:  .  acetaminophen (TYLENOL) 325 MG tablet, Take 650 mg by mouth daily., Disp: , Rfl:  .  albuterol (PROAIR HFA) 108 (90 BASE) MCG/ACT inhaler, Inhale 2 puffs into the lungs every 4 (four) hours as needed for wheezing or shortness of breath. , Disp: , Rfl:  .  amLODipine (NORVASC) 10 MG tablet, Take 10 mg by  mouth daily., Disp: , Rfl:  .  ARIPiprazole (ABILIFY) 2 MG tablet, Take 2 mg daily by mouth., Disp: , Rfl:  .  atorvastatin (LIPITOR) 10 MG tablet, Take 10 mg by mouth every other day. , Disp: , Rfl:  .  bismuth subsalicylate (PEPTO BISMOL) 262 MG/15ML suspension, Take 30 mLs by mouth 2 (two) times daily as needed for indigestion. , Disp: , Rfl:  .  Calcium Carbonate Antacid (TUMS PO), Take 4 tablets by mouth 4 (four) times daily as needed (heartburn). Reported on 10/06/2015, Disp: , Rfl:  .  carbidopa-levodopa (SINEMET IR) 10-100 MG tablet, Take 1 tablet 3 (three) times daily by mouth., Disp: , Rfl:  .  clopidogrel (PLAVIX) 75 MG tablet, Take 75 mg by mouth daily., Disp: , Rfl:  .  cyanocobalamin (,VITAMIN B-12,) 1000 MCG/ML injection, Inject 1,000 mcg into the muscle every 30 (thirty) days., Disp: , Rfl:  .  divalproex (DEPAKOTE ER) 500 MG 24 hr tablet, Take 500 mg by mouth 2 (two) times daily., Disp: , Rfl:  .  divalproex (DEPAKOTE) 500 MG DR tablet, , Disp: , Rfl:  .  Fluticasone-Salmeterol (ADVAIR) 250-50 MCG/DOSE AEPB, Inhale 1 puff into the lungs  daily., Disp: , Rfl:  .  folic acid (FOLVITE) 1 MG tablet, Take 1 mg by mouth daily., Disp: , Rfl:  .  HYDROcodone-acetaminophen (NORCO/VICODIN) 5-325 MG tablet, TK 1 T PO Q 6 H PRF MODERATE PAIN, Disp: , Rfl:  .  levothyroxine (SYNTHROID, LEVOTHROID) 50 MCG tablet, Take 50 mcg daily before breakfast by mouth., Disp: , Rfl:  .  MELATONIN PO, Take 9 mg daily by mouth. 2 tablets at bedtime, Disp: , Rfl:  .  memantine (NAMENDA) 10 MG tablet, Take 10 mg by mouth 2 (two) times daily. Reported on 10/06/2015, Disp: , Rfl:  .  metFORMIN (GLUCOPHAGE) 500 MG tablet, Take 500 mg by mouth 2 (two) times daily with a meal., Disp: , Rfl:  .  metFORMIN (GLUCOPHAGE-XR) 500 MG 24 hr tablet, , Disp: , Rfl:  .  Multiple Vitamin (MULTIVITAMIN PO), Take 1 tablet by mouth daily. , Disp: , Rfl:  .  ondansetron (ZOFRAN) 4 MG tablet, , Disp: , Rfl:  .  oxycodone (OXY-IR) 5  MG capsule, Take 5 mg by mouth every 4 (four) hours as needed., Disp: , Rfl:  .  pantoprazole (PROTONIX) 40 MG tablet, Take 40 mg daily by mouth., Disp: , Rfl:  .  QUEtiapine (SEROQUEL) 100 MG tablet, TAKE 1 TABLET BY MOUTH AT BEDTIME AFTER 2 NIGHTS, MAY INCREASE TO 2 TABLETS BY MOUTH AT BEDTIME, Disp: 60 tablet, Rfl: 5 .  ranitidine (ZANTAC) 150 MG tablet, Take 150 mg 2 (two) times daily by mouth., Disp: , Rfl:  .  tamsulosin (FLOMAX) 0.4 MG CAPS capsule, Take 0.4 mg by mouth daily., Disp: , Rfl:  .  temazepam (RESTORIL) 15 MG capsule, Take 1 capsule (15 mg total) by mouth at bedtime. Reported on 10/06/2015, Disp: 30 capsule, Rfl: 5 .  traMADol (ULTRAM) 50 MG tablet, Take 50 mg by mouth every 6 (six) hours as needed., Disp: , Rfl:  .  traZODone (DESYREL) 50 MG tablet, Take 100 mg by mouth at bedtime. Reported on 10/06/2015, Disp: , Rfl:   Social History   Tobacco Use  Smoking Status Never Smoker  Smokeless Tobacco Never Used    Allergies  Allergen Reactions  . Shellfish Allergy Anaphylaxis    Patient informed RN no problem with Betadine 08/04/2015   . Latex Rash    Reports facial rash from Cpap mask   Objective:   Vitals:   02/10/19 1012  Resp: 16  Temp: (!) 96.5 F (35.8 C)   There is no height or weight on file to calculate BMI. Constitutional Well developed. Well nourished.  Vascular Dorsalis pedis pulses palpable bilaterally. Posterior tibial pulses palpable bilaterally. Capillary refill normal to all digits.  No cyanosis or clubbing noted. Pedal hair growth normal.  Neurologic Normal speech. Oriented to person, place, and time. Epicritic sensation to light touch grossly present bilaterally. Vibratory sensation intact SWMF 5/5 bilat.  Dermatologic Nails elongated dystrophic pain to palpation No open wounds. No skin lesions.  Orthopedic: Normal joint ROM without pain or crepitus bilaterally. No visible deformities. No bony tenderness.   Radiographs: None  Assessment:   1. Pain due to onychomycosis of nail   2. Coagulation defect ([place])   3. Comprehensive diabetic foot examination, type 2 DM, encounter for [facility])    Plan:  Patient was evaluated and treated and all questions answered.  DM, Onychomycosis with pain -Nails palliatively debridement as below -Educated on self-care  DM Foot Exam -DM Risk 0 -Advised he does not qualify for DM shoes given good  circulation and sensation. Not interested in paying out of pocket for them.  Procedure: Nail Debridement Rationale: Pain Type of Debridement: manual, sharp debridement. Instrumentation: Nail nipper, rotary burr. Number of Nails: 10  Return in about 3 months (around 05/13/2019) for Diabetic Foot Care - On AC .

## 2019-03-04 ENCOUNTER — Telehealth: Payer: Self-pay

## 2019-03-04 NOTE — Telephone Encounter (Signed)
Covid-19 screening questions for 7-22 appt  Do you now or have you had a fever in the last 14 days?  Do you have any respiratory symptoms of shortness of breath or cough now or in the last 14 days? asthma  Do you have any family members or close contacts with diagnosed or suspected Covid-19 in the past 14 days?  Have you been tested for Covid-19 and found to be positive?  Loretta: caregiver, answered NO for herself for all questions and she answered  NO for the pt  With the exception that pt has asthma and occasionally has SOB normally.

## 2019-03-05 ENCOUNTER — Ambulatory Visit: Payer: Medicare HMO | Admitting: Gastroenterology

## 2019-03-12 ENCOUNTER — Encounter: Payer: Self-pay | Admitting: Gastroenterology

## 2019-03-20 ENCOUNTER — Telehealth (HOSPITAL_COMMUNITY): Payer: Self-pay | Admitting: Internal Medicine

## 2019-03-20 DIAGNOSIS — G3109 Other frontotemporal dementia: Secondary | ICD-10-CM | POA: Diagnosis not present

## 2019-03-20 DIAGNOSIS — M545 Low back pain: Secondary | ICD-10-CM | POA: Diagnosis not present

## 2019-03-20 DIAGNOSIS — E785 Hyperlipidemia, unspecified: Secondary | ICD-10-CM | POA: Diagnosis not present

## 2019-03-20 DIAGNOSIS — F319 Bipolar disorder, unspecified: Secondary | ICD-10-CM | POA: Diagnosis not present

## 2019-03-20 DIAGNOSIS — J45909 Unspecified asthma, uncomplicated: Secondary | ICD-10-CM | POA: Diagnosis not present

## 2019-03-20 DIAGNOSIS — E039 Hypothyroidism, unspecified: Secondary | ICD-10-CM | POA: Insufficient documentation

## 2019-03-20 DIAGNOSIS — G20 Parkinson's disease: Secondary | ICD-10-CM | POA: Diagnosis not present

## 2019-03-20 DIAGNOSIS — I10 Essential (primary) hypertension: Secondary | ICD-10-CM | POA: Diagnosis not present

## 2019-03-20 DIAGNOSIS — E1129 Type 2 diabetes mellitus with other diabetic kidney complication: Secondary | ICD-10-CM | POA: Diagnosis not present

## 2019-03-20 DIAGNOSIS — Z0000 Encounter for general adult medical examination without abnormal findings: Secondary | ICD-10-CM | POA: Diagnosis not present

## 2019-03-24 ENCOUNTER — Ambulatory Visit: Payer: Medicare HMO | Admitting: Gastroenterology

## 2019-04-07 ENCOUNTER — Other Ambulatory Visit: Payer: Self-pay

## 2019-04-23 ENCOUNTER — Other Ambulatory Visit: Payer: Self-pay

## 2019-04-23 ENCOUNTER — Ambulatory Visit (INDEPENDENT_AMBULATORY_CARE_PROVIDER_SITE_OTHER): Payer: Medicare HMO | Admitting: Psychiatry

## 2019-04-23 DIAGNOSIS — F319 Bipolar disorder, unspecified: Secondary | ICD-10-CM

## 2019-04-23 DIAGNOSIS — F3110 Bipolar disorder, current episode manic without psychotic features, unspecified: Secondary | ICD-10-CM

## 2019-04-23 MED ORDER — QUETIAPINE FUMARATE 100 MG PO TABS: ORAL_TABLET | ORAL | 5 refills | Status: DC

## 2019-04-23 MED ORDER — TEMAZEPAM 15 MG PO CAPS: 15.0000 mg | ORAL_CAPSULE | Freq: Every day | ORAL | 5 refills | Status: DC

## 2019-04-23 MED ORDER — ARIPIPRAZOLE 2 MG PO TABS: 2.0000 mg | ORAL_TABLET | Freq: Every day | ORAL | 5 refills | Status: DC

## 2019-04-23 NOTE — Progress Notes (Signed)
Psychiatric Initial Adult Assessment   Patient Identification: [name] MRN:  [id number] Date of Evaluation:  04/23/2019 Referral Source: Dr Brigitte Pulse Chief Complaint:   Visit Diagnosis: Bipolar Depressed  History of Present Illness:  This patient is well-known to me. He has bipolar disorder. Is not been seen in 2 years. This is because she's had multiple surgeries. Once when he was in an assisted-living setting for number months. He still lives now with his significant other [name]. Patient is actually doing fairly well. He still able to get around and walked slowly. One of the issues is he seems to be sleeping too much. Generally he is cognitively completely intact. He denies persistent depression. He denies euphoria or irritability. He is eating well. He shows no signs of psychosis. It is not her voices or see visions. Patient is closely followed medically. His had 2 back surgeries. Patient spends a lot of time watching TV but no longer reads. Generally patient described as calm but sometimes uncooperative. He doesn't seem to be depressed or anxious. He needs assistance dressing. This patient impaired was last visit 2 years ago is nearly the same. He still well oriented lucid and clear in his thinking.  Associated Signs/Symptoms: Depression Symptoms:  depressed mood, insomnia,(Hypoymptoms:   Anxiety Symptoms:   Psychotic Symptoms:   PTSD Symptoms:   Past Psychiatric History:  Multiple psychiatric medicines   Previous Psychotropic Medications: Yes   Substance Abuse History in the last 12 months:    Consequences of Substance Abuse:   Past Medical History:  Past Medical History:  Diagnosis Date  . Anxiety    Xanax as needed  . Arthritis    spinal stenosis.  . Asthma   . Bipolar disorder ([place])   . Cataract   . Chronic kidney disease    since NSAID's stopped -function has improved  . COPD (chronic obstructive pulmonary disease) ([place])   . Crohn's colitis ([place])   . Depression     takes Cymbalta daily  . Diverticulosis 01/2007  . DM (diabetes mellitus) ([place])    takes Januvia now, metformin stopped due to diarrhea  . ED (erectile dysfunction)   . GERD (gastroesophageal reflux disease)    uses Tums  . Glaucoma    surgery to correct  . History of colon polyps   . HLD (hyperlipidemia)    takes Lipitor daily  . HTN (hypertension)    takes Altace and Diovan daily  . Impaired hearing    "getting this checked out on Monday"  . Insomnia   . Internal hemorrhoids 01/2007  . Joint pain   . Joint swelling   . Microscopic colitis   . Nocturia   . Obesity   . Pneumonia 3/09  . Sleep apnea    uses CPAP;has been greater than 28yrs since sleep study- Dr. Elsworth Soho    Past Surgical History:  Procedure Laterality Date  . CATARACT EXTRACTION W/ INTRAOCULAR LENS  IMPLANT, BILATERAL  2011  . CHOLECYSTECTOMY  2011  . COLONOSCOPY  01/01/2014   Mild patchy colitis of questionable importance. Moderate predominantly sigmoid diverticulosis. Small internal hemorrhoids. Limited examination due to quality of preparation.    . COLONOSCOPY W/ BIOPSIES  2008   Looks normal, biopsy suggested a chronic microscopic colitis with rare granulomas  . COLONOSCOPY W/ BIOPSIES  02/2011   diverticulosis, internal hemorrhoids, patchy chronic colitis on random bxs,   . ESOPHAGOGASTRODUODENOSCOPY  07/18/2017   Barretts esophagus. Small hiatal hernia.   Marland Kitchen EYE SURGERY  for glaucoma  . HERNIA REPAIR    . LUMBAR LAMINECTOMY/DECOMPRESSION MICRODISCECTOMY N/A 08/04/2015   Procedure: CENTRAL DECOMPRESSION  LUMBAR LAMINECTOMY L4-L5 ;  Surgeon: Latanya Maudlin, MD;  Location: [facility];  Service: Orthopedics;  Laterality: N/A;  . NOSE SURGERY  2011  . PENILE PROSTHESIS IMPLANT  2012  . ROTATOR CUFF REPAIR Bilateral    '12 -left, '13 -right  . SHOULDER ARTHROSCOPY  08/24/2011   Procedure: ARTHROSCOPY SHOULDER;  Surgeon: Metta Clines Supple;  Location: [facility];  Service: Orthopedics;  Laterality: Right;  RIGHT  SHOULDER ARTHROSCOPY WITH SUBACROMINAL DECOMPRESSION AND DISTAL CLAVICAL RESECTION   . sleep apnea surgery  1997  . TONSILLECTOMY     as a child  . UMBILICAL HERNIA REPAIR  5+yrs ago    Family Psychiatric History:   Family History:  Family History  Problem Relation Age of Onset  . Esophageal cancer Paternal Grandmother   . Heart failure Mother   . Depression Mother   . Alzheimer's disease Brother   . Heart attack Father   . Pneumonia Father   . Colon cancer Neg Hx     Social History:   Social History   Socioeconomic History  . Marital status: Widowed    Spouse name: Not on file  . Number of children: Not on file  . Years of education: Not on file  . Highest education level: Not on file  Occupational History  . Occupation: retired    Comment: Paramedic  Social Needs  . Financial resource strain: Not on file  . Food insecurity    Worry: Not on file    Inability: Not on file  . Transportation needs    Medical: Not on file    Non-medical: Not on file  Tobacco Use  . Smoking status: Never Smoker  . Smokeless tobacco: Never Used  Substance and Sexual Activity  . Alcohol use: No  . Drug use: No  . Sexual activity: Not Currently  Lifestyle  . Physical activity    Days per week: Not on file    Minutes per session: Not on file  . Stress: Not on file  Relationships  . Social Herbalist on phone: Not on file    Gets together: Not on file    Attends religious service: Not on file    Active member of club or organization: Not on file    Attends meetings of clubs or organizations: Not on file    Relationship status: Not on file  Other Topics Concern  . 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 Shellfish Allergy Anaphylaxis    Patient informed RN no problem with Betadine  08/04/2015   . Latex Rash    Reports facial rash from Cpap mask    Metabolic Disorder Labs: Lab Results  Component Value Date   HGBA1C 9.9 (H) 08/02/2015   MPG 237 08/02/2015   No results found for: PROLACTIN No results found for: CHOL, TRIG, HDL, CHOLHDL, VLDL, LDLCALC Lab Results  Component Value Date   TSH 0.466 08/05/2015    Therapeutic Level Labs: No results found for: LITHIUM No results found for: CBMZ Lab Results  Component Value Date   VALPROATE 35 (L) 08/05/2015    Current Medications: Current Outpatient Medications  Medication Sig Dispense Refill  .  acetaminophen (TYLENOL) 325 MG tablet Take 650 mg by mouth daily.    Marland Kitchen albuterol (PROAIR HFA) 108 (90 BASE) MCG/ACT inhaler Inhale 2 puffs into the lungs every 4 (four) hours as needed for wheezing or shortness of breath.     Marland Kitchen amLODipine (NORVASC) 10 MG tablet Take 10 mg by mouth daily.    . ARIPiprazole (ABILIFY) 2 MG tablet Take 2 mg daily by mouth.    Marland Kitchen atorvastatin (LIPITOR) 10 MG tablet Take 10 mg by mouth every other day.     . bismuth subsalicylate (PEPTO BISMOL) 262 MG/15ML suspension Take 30 mLs by mouth 2 (two) times daily as needed for indigestion.     . Calcium Carbonate Antacid (TUMS PO) Take 4 tablets by mouth 4 (four) times daily as needed (heartburn). Reported on 10/06/2015    . carbidopa-levodopa (SINEMET IR) 10-100 MG tablet Take 1 tablet 3 (three) times daily by mouth.    . clopidogrel (PLAVIX) 75 MG tablet Take 75 mg by mouth daily.    . cyanocobalamin (,VITAMIN B-12,) 1000 MCG/ML injection Inject 1,000 mcg into the muscle every 30 (thirty) days.    . divalproex (DEPAKOTE ER) 500 MG 24 hr tablet Take 500 mg by mouth 2 (two) times daily.    . divalproex (DEPAKOTE) 500 MG DR tablet     . Fluticasone-Salmeterol (ADVAIR) 250-50 MCG/DOSE AEPB Inhale 1 puff into the lungs daily.    . folic acid (FOLVITE) 1 MG tablet Take 1 mg by mouth daily.    Marland Kitchen HYDROcodone-acetaminophen (NORCO/VICODIN) 5-325 MG tablet  TK 1 T PO Q 6 H PRF MODERATE PAIN    . levothyroxine (SYNTHROID, LEVOTHROID) 50 MCG tablet Take 50 mcg daily before breakfast by mouth.    . MELATONIN PO Take 9 mg daily by mouth. 2 tablets at bedtime    . memantine (NAMENDA) 10 MG tablet Take 10 mg by mouth 2 (two) times daily. Reported on 10/06/2015    . metFORMIN (GLUCOPHAGE) 500 MG tablet Take 500 mg by mouth 2 (two) times daily with a meal.    . metFORMIN (GLUCOPHAGE-XR) 500 MG 24 hr tablet     . Multiple Vitamin (MULTIVITAMIN PO) Take 1 tablet by mouth daily.     . ondansetron (ZOFRAN) 4 MG tablet     . oxycodone (OXY-IR) 5 MG capsule Take 5 mg by mouth every 4 (four) hours as needed.    . pantoprazole (PROTONIX) 40 MG tablet Take 40 mg daily by mouth.    . QUEtiapine (SEROQUEL) 100 MG tablet TAKE 1 TABLET BY MOUTH AT BEDTIME AFTER 2 NIGHTS, MAY INCREASE TO 2 TABLETS BY MOUTH AT BEDTIME 60 tablet 5  . ranitidine (ZANTAC) 150 MG tablet Take 150 mg 2 (two) times daily by mouth.    . tamsulosin (FLOMAX) 0.4 MG CAPS capsule Take 0.4 mg by mouth daily.    . temazepam (RESTORIL) 15 MG capsule Take 1 capsule (15 mg total) by mouth at bedtime. Reported on 10/06/2015 30 capsule 5  . traMADol (ULTRAM) 50 MG tablet Take 50 mg by mouth every 6 (six) hours as needed.    . traZODone (DESYREL) 50 MG tablet Take 100 mg by mouth at bedtime. Reported on 10/06/2015     No current facility-administered medications for this visit.     Musculoskeletal: Strength & Muscle Tone:  Gait & Station:  Patient leans:   Psychiatric Specialty Exam: ROS  There were no vitals taken for this visit.There is no height or weight on file to  calculate BMI.  General Appearance:   Eye Contact:    Speech:    Volume:    Mood:    Affect:    Thought Process:  Goal Directed  Orientation:  Full (Time, Place, and Person)  Thought Content:     Suicidal Thoughts:  No  Homicidal Thoughts:  No  Memory:  NA  Judgement:  Good  Insight:  Fair  Psychomotor Activity:  Normal   Concentration:    Recall:  Good  Fund of Knowledge:  Language:   Akathisia:    Handed:    AIMS (if indicated):  not done  Assets:  Desire for Improvement  ADL's:  Impaired  Cognition: Impaired,  Mild  Sleep:  Fair   Screenings: PHQ2-9     Patient Outreach from 07/13/2016 in [place]  PHQ-2 Total Score  6  PHQ-9 Total Score  27      Assessment and Plan:   At this time the patient is given fairly well. Diagnosed with bipolar disorder depressed type. At this time he is mildly overweight diabetic and over sedated. Fortunately has not fallen. They will go ahead and reduce his Seroquel from a dose of 200 mg 100 mg. Continue taking Abilify 10 mg continue taking Restoril 15 mg. Patient is not suicidal. His functioning fairly well. Return to see me hopefully in person in 2 months.   Jerral Ralph, MD 9/9/20203:04 PM

## 2019-04-23 NOTE — Progress Notes (Signed)
Psychiatric Initial Adult Assessment   Patient Identification: [name] MRN:  [id number] Date of Evaluation:  04/23/2019 Referral Source: Chief Complaint:   Visit Diagnosis: No diagnosis found.  History of Present Illness:   Today the patient is seen with his fiance [name] who he lives with. The patient is clearly better. Unfortunately since I've seen him he's had a number of falls. It for falls and because of that he could not come to see me. This was not related to his medications but simply because he has a gait disturbance. Presently he is in physical therapy to try to deal. The patient himself denies being depressed. He says he feels great. He denies any anxiety. His fiance and generally agrees. She says he's better in a number of ways. First she doesn't seem to be sundowning as much at all. He seems alert and with it. The patient is sleeping and eating very well. He is friendly and engaging. He is compliant. He shows no signs of agitation. His energy level is good. Since her last visit over the phone we returned his Seroquel and increased 200 mg. According to both of them is very helpful. Is less anxious less agitated. The patient denies being suicidal. He is no evidence of tardive dyskinesia or any other problems related to his medications. Associated Signs/Symptoms:Depression Symptoms:  depressed mood, (Hypo) Manic Symptoms:   Anxiety Symptoms:   Psychotic Symptoms:   PTSD Symptoms:   Past Psychiatric History: Multiple antidepressants 2 psychiatric hospitalizations  Previous Psychotropic Medications: Yes   Substance Abuse History in the last 12 months:  No.  Consequences of Substance Abuse:   Past Medical History:  Past Medical History:  Diagnosis Date  . Anxiety    Xanax as needed  . Arthritis    spinal stenosis.  . Asthma   . Bipolar disorder ([place])   . Cataract   . Chronic kidney disease    since NSAID's stopped -function has improved  . COPD (chronic  obstructive pulmonary disease) ([place])   . Crohn's colitis ([place])   . Depression    takes Cymbalta daily  . Diverticulosis 01/2007  . DM (diabetes mellitus) ([place])    takes Januvia now, metformin stopped due to diarrhea  . ED (erectile dysfunction)   . GERD (gastroesophageal reflux disease)    uses Tums  . Glaucoma    surgery to correct  . History of colon polyps   . HLD (hyperlipidemia)    takes Lipitor daily  . HTN (hypertension)    takes Altace and Diovan daily  . Impaired hearing    "getting this checked out on Monday"  . Insomnia   . Internal hemorrhoids 01/2007  . Joint pain   . Joint swelling   . Microscopic colitis   . Nocturia   . Obesity   . Pneumonia 3/09  . Sleep apnea    uses CPAP;has been greater than 59yrs since sleep study- Dr. Elsworth Soho    Past Surgical History:  Procedure Laterality Date  . CATARACT EXTRACTION W/ INTRAOCULAR LENS  IMPLANT, BILATERAL  2011  . CHOLECYSTECTOMY  2011  . COLONOSCOPY  01/01/2014   Mild patchy colitis of questionable importance. Moderate predominantly sigmoid diverticulosis. Small internal hemorrhoids. Limited examination due to quality of preparation.    . COLONOSCOPY W/ BIOPSIES  2008   Looks normal, biopsy suggested a chronic microscopic colitis with rare granulomas  . COLONOSCOPY W/ BIOPSIES  02/2011   diverticulosis, internal hemorrhoids, patchy chronic colitis on random bxs,   .  ESOPHAGOGASTRODUODENOSCOPY  07/18/2017   Barretts esophagus. Small hiatal hernia.   Marland Kitchen EYE SURGERY     for glaucoma  . HERNIA REPAIR    . LUMBAR LAMINECTOMY/DECOMPRESSION MICRODISCECTOMY N/A 08/04/2015   Procedure: CENTRAL DECOMPRESSION  LUMBAR LAMINECTOMY L4-L5 ;  Surgeon: Latanya Maudlin, MD;  Location: [facility];  Service: Orthopedics;  Laterality: N/A;  . NOSE SURGERY  2011  . PENILE PROSTHESIS IMPLANT  2012  . ROTATOR CUFF REPAIR Bilateral    '12 -left, '13 -right  . SHOULDER ARTHROSCOPY  08/24/2011   Procedure: ARTHROSCOPY SHOULDER;  Surgeon: Metta Clines  Supple;  Location: [facility];  Service: Orthopedics;  Laterality: Right;  RIGHT SHOULDER ARTHROSCOPY WITH SUBACROMINAL DECOMPRESSION AND DISTAL CLAVICAL RESECTION   . sleep apnea surgery  1997  . TONSILLECTOMY     as a child  . UMBILICAL HERNIA REPAIR  5+yrs ago    Family Psychiatric History:   Family History:  Family History  Problem Relation Age of Onset  . Esophageal cancer Paternal Grandmother   . Heart failure Mother   . Depression Mother   . Alzheimer's disease Brother   . Heart attack Father   . Pneumonia Father   . Colon cancer Neg Hx     Social History:   Social History   Socioeconomic History  . Marital status: Widowed    Spouse name: Not on file  . Number of children: Not on file  . Years of education: Not on file  . Highest education level: Not on file  Occupational History  . Occupation: retired    Comment: Paramedic  Social Needs  . Financial resource strain: Not on file  . Food insecurity    Worry: Not on file    Inability: Not on file  . Transportation needs    Medical: Not on file    Non-medical: Not on file  Tobacco Use  . Smoking status: Never Smoker  . Smokeless tobacco: Never Used  Substance and Sexual Activity  . Alcohol use: No  . Drug use: No  . Sexual activity: Not Currently  Lifestyle  . Physical activity    Days per week: Not on file    Minutes per session: Not on file  . Stress: Not on file  Relationships  . Social Herbalist on phone: Not on file    Gets together: Not on file    Attends religious service: Not on file    Active member of club or organization: Not on file    Attends meetings of clubs or organizations: Not on file    Relationship status: Not on file  Other Topics Concern  . 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  Shellfish Allergy Anaphylaxis    Patient informed RN no problem with Betadine 08/04/2015   . Latex Rash    Reports facial rash from Cpap mask    Metabolic Disorder Labs: Lab Results  Component Value Date   HGBA1C 9.9 (H) 08/02/2015   MPG 237 08/02/2015   No results found for: PROLACTIN No results found for: CHOL, TRIG, HDL, CHOLHDL, VLDL, LDLCALC   Current Medications: Current Outpatient Medications  Medication Sig Dispense Refill  . acetaminophen (TYLENOL) 325 MG tablet Take 650 mg by mouth daily.    Marland Kitchen albuterol (PROAIR HFA) 108 (90 BASE)  MCG/ACT inhaler Inhale 2 puffs into the lungs every 4 (four) hours as needed for wheezing or shortness of breath.     Marland Kitchen amLODipine (NORVASC) 10 MG tablet Take 10 mg by mouth daily.    . ARIPiprazole (ABILIFY) 2 MG tablet Take 2 mg daily by mouth.    Marland Kitchen atorvastatin (LIPITOR) 10 MG tablet Take 10 mg by mouth every other day.     . bismuth subsalicylate (PEPTO BISMOL) 262 MG/15ML suspension Take 30 mLs by mouth 2 (two) times daily as needed for indigestion.     . Calcium Carbonate Antacid (TUMS PO) Take 4 tablets by mouth 4 (four) times daily as needed (heartburn). Reported on 10/06/2015    . carbidopa-levodopa (SINEMET IR) 10-100 MG tablet Take 1 tablet 3 (three) times daily by mouth.    . clopidogrel (PLAVIX) 75 MG tablet Take 75 mg by mouth daily.    . cyanocobalamin (,VITAMIN B-12,) 1000 MCG/ML injection Inject 1,000 mcg into the muscle every 30 (thirty) days.    . divalproex (DEPAKOTE ER) 500 MG 24 hr tablet Take 500 mg by mouth 2 (two) times daily.    . divalproex (DEPAKOTE) 500 MG DR tablet     . Fluticasone-Salmeterol (ADVAIR) 250-50 MCG/DOSE AEPB Inhale 1 puff into the lungs daily.    . folic acid (FOLVITE) 1 MG tablet Take 1 mg by mouth daily.    Marland Kitchen HYDROcodone-acetaminophen (NORCO/VICODIN) 5-325 MG tablet TK 1 T PO Q 6 H PRF MODERATE PAIN    . levothyroxine (SYNTHROID, LEVOTHROID) 50 MCG tablet Take 50 mcg daily before breakfast by mouth.     . MELATONIN PO Take 9 mg daily by mouth. 2 tablets at bedtime    . memantine (NAMENDA) 10 MG tablet Take 10 mg by mouth 2 (two) times daily. Reported on 10/06/2015    . metFORMIN (GLUCOPHAGE) 500 MG tablet Take 500 mg by mouth 2 (two) times daily with a meal.    . metFORMIN (GLUCOPHAGE-XR) 500 MG 24 hr tablet     . Multiple Vitamin (MULTIVITAMIN PO) Take 1 tablet by mouth daily.     . ondansetron (ZOFRAN) 4 MG tablet     . oxycodone (OXY-IR) 5 MG capsule Take 5 mg by mouth every 4 (four) hours as needed.    . pantoprazole (PROTONIX) 40 MG tablet Take 40 mg daily by mouth.    . QUEtiapine (SEROQUEL) 100 MG tablet TAKE 1 TABLET BY MOUTH AT BEDTIME AFTER 2 NIGHTS, MAY INCREASE TO 2 TABLETS BY MOUTH AT BEDTIME 60 tablet 5  . ranitidine (ZANTAC) 150 MG tablet Take 150 mg 2 (two) times daily by mouth.    . tamsulosin (FLOMAX) 0.4 MG CAPS capsule Take 0.4 mg by mouth daily.    . temazepam (RESTORIL) 15 MG capsule Take 1 capsule (15 mg total) by mouth at bedtime. Reported on 10/06/2015 30 capsule 5  . traMADol (ULTRAM) 50 MG tablet Take 50 mg by mouth every 6 (six) hours as needed.    . traZODone (DESYREL) 50 MG tablet Take 100 mg by mouth at bedtime. Reported on 10/06/2015     No current facility-administered medications for this visit.     Neurologic: Headache: No Seizure: Yes Paresthesias:No  Musculoskeletal: Strength & Muscle Tone: abnormal Gait & Station: unsteady Patient leans: N/A  Psychiatric Specialty Exam: ROS  There were no vitals taken for this visit.There is no height or weight on file to calculate BMI.  General Appearance: Casual  Eye Contact:  Good  Speech:  Clear and Coherent  Volume:  Normal  Mood:  Depressed  Affect:  Appropriate  Thought Process:  Goal Directed  Orientation:  NA  Thought Content:  Logical  Suicidal Thoughts:  No  Homicidal Thoughts:  No  Memory:  Negative  Judgement:  Impaired  Insight:  Lacking  Psychomotor Activity:  Normal  Concentration:     Recall:  [place] of Knowledge:Fair  Language: Good  Akathisia:  No  Handed:  Right  AIMS (if indicated):    Assets:  Desire for Improvement  ADL's:  Impaired  Cognition: Impaired,  Moderate  Sleep:      Treatment Plan [place]  At this time the patient is clearly improved. I think he has a dementia disorder but I do not think it's severe. For reasons that are not clear his Aricept was discontinued but his pneumatic continues. He's come off of trazodone without a problem. He takes Restoril 15 mg at night which helps him sleep. He also takes melatonin. He presently takes 200 mg a Seroquel which helps him sleep and does not seem to make him oversedated. He'll continue taking 10 mg of Abilify in addition to the Seroquel. Forward ever reason the patient's mood is much improved. Is much more compliant less agitated and overall more cooperative. Both him and his fiance are pleased about his status.   Jerral Ralph, MD 9/9/20203:03 PM

## 2019-04-23 NOTE — Progress Notes (Signed)
Psychiatric Initial Adult Assessment   Patient Identification: [name] MRN:  [id number] Date of Evaluation:  04/23/2019 Referral Source: Dr. Brigitte Pulse Chief Complaint:   Visit Diagnosis: Bipolar disorder depressed  History of Present Illness:  This patient is well-known to me. Is not been seen in 2 years. His been going because he had 2 back surgeries and spent a number months in an assisted-living setting. This together with the presence of the corona virus as kept him away. Actually stayed on the same medicines we had a month. Today we spent time talking to him and his significant other, Loreta. It is drives being calm and compliant. He still is actually very lucid. He no longer takes Namenda. He is very homebound. He hasn't been able to drive for 2 years. He walks very slowly but is still ambulatory. He feeds himself to go to the bathroom as needed. He needs assistance dressing and bathing. Patient denies being depressed and is caregiver agrees. He does cry a couple times a month but short-lived. Spends a lot of time watching TV. Used to read but he no longer does. It on complaints about him is that he sleeping excessively. He seems like his energy is low. It is noted that he doesn't diabetes albeit is well controlled. Denies chest pain or shortness of breath or any neurological symptoms. He shows no signs of psychosis. Generally is very similar to baseline as he was 2 years ago. He still well oriented still has a lot of character to himself.  Associated Signs/Symptoms: Depression Symptoms:  depressed mood, (Hypo) Manic Symptoms:   Anxiety Symptoms:   Psychotic Symptoms:   PTSD Symptoms:   Past Psychiatric History: Multiple psychotropic medicines hospitalized one time at [facility]  Previous Psychotropic Medications: No   Substance Abuse History in the last 12 months:  No.  Consequences of Substance Abuse:   Past Medical History:  Past Medical History:  Diagnosis Date  . Anxiety     Xanax as needed  . Arthritis    spinal stenosis.  . Asthma   . Bipolar disorder ([place])   . Cataract   . Chronic kidney disease    since NSAID's stopped -function has improved  . COPD (chronic obstructive pulmonary disease) ([place])   . Crohn's colitis ([place])   . Depression    takes Cymbalta daily  . Diverticulosis 01/2007  . DM (diabetes mellitus) ([place])    takes Januvia now, metformin stopped due to diarrhea  . ED (erectile dysfunction)   . GERD (gastroesophageal reflux disease)    uses Tums  . Glaucoma    surgery to correct  . History of colon polyps   . HLD (hyperlipidemia)    takes Lipitor daily  . HTN (hypertension)    takes Altace and Diovan daily  . Impaired hearing    "getting this checked out on Monday"  . Insomnia   . Internal hemorrhoids 01/2007  . Joint pain   . Joint swelling   . Microscopic colitis   . Nocturia   . Obesity   . Pneumonia 3/09  . Sleep apnea    uses CPAP;has been greater than 54yrs since sleep study- Dr. Elsworth Soho    Past Surgical History:  Procedure Laterality Date  . CATARACT EXTRACTION W/ INTRAOCULAR LENS  IMPLANT, BILATERAL  2011  . CHOLECYSTECTOMY  2011  . COLONOSCOPY  01/01/2014   Mild patchy colitis of questionable importance. Moderate predominantly sigmoid diverticulosis. Small internal hemorrhoids. Limited examination due to quality of preparation.    Marland Kitchen  COLONOSCOPY W/ BIOPSIES  2008   Looks normal, biopsy suggested a chronic microscopic colitis with rare granulomas  . COLONOSCOPY W/ BIOPSIES  02/2011   diverticulosis, internal hemorrhoids, patchy chronic colitis on random bxs,   . ESOPHAGOGASTRODUODENOSCOPY  07/18/2017   Barretts esophagus. Small hiatal hernia.   Marland Kitchen EYE SURGERY     for glaucoma  . HERNIA REPAIR    . LUMBAR LAMINECTOMY/DECOMPRESSION MICRODISCECTOMY N/A 08/04/2015   Procedure: CENTRAL DECOMPRESSION  LUMBAR LAMINECTOMY L4-L5 ;  Surgeon: Latanya Maudlin, MD;  Location: [facility];  Service: Orthopedics;  Laterality: N/A;  .  NOSE SURGERY  2011  . PENILE PROSTHESIS IMPLANT  2012  . ROTATOR CUFF REPAIR Bilateral    '12 -left, '13 -right  . SHOULDER ARTHROSCOPY  08/24/2011   Procedure: ARTHROSCOPY SHOULDER;  Surgeon: Metta Clines Supple;  Location: [facility];  Service: Orthopedics;  Laterality: Right;  RIGHT SHOULDER ARTHROSCOPY WITH SUBACROMINAL DECOMPRESSION AND DISTAL CLAVICAL RESECTION   . sleep apnea surgery  1997  . TONSILLECTOMY     as a child  . UMBILICAL HERNIA REPAIR  5+yrs ago    Family Psychiatric History:   Family History:  Family History  Problem Relation Age of Onset  . Esophageal cancer Paternal Grandmother   . Heart failure Mother   . Depression Mother   . Alzheimer's disease Brother   . Heart attack Father   . Pneumonia Father   . Colon cancer Neg Hx     Social History:   Social History   Socioeconomic History  . Marital status: Widowed    Spouse name: Not on file  . Number of children: Not on file  . Years of education: Not on file  . Highest education level: Not on file  Occupational History  . Occupation: retired    Comment: Paramedic  Social Needs  . Financial resource strain: Not on file  . Food insecurity    Worry: Not on file    Inability: Not on file  . Transportation needs    Medical: Not on file    Non-medical: Not on file  Tobacco Use  . Smoking status: Never Smoker  . Smokeless tobacco: Never Used  Substance and Sexual Activity  . Alcohol use: No  . Drug use: No  . Sexual activity: Not Currently  Lifestyle  . Physical activity    Days per week: Not on file    Minutes per session: Not on file  . Stress: Not on file  Relationships  . Social Herbalist on phone: Not on file    Gets together: Not on file    Attends religious service: Not on file    Active member of club or organization: Not on file    Attends meetings of clubs or organizations: Not on file    Relationship status: Not on file  Other Topics Concern  . Not on file  Social History  Narrative   Retired Teaching laboratory technician, 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    Additional Social History:   Allergies:   Allergies  Allergen Reactions  . Shellfish Allergy Anaphylaxis    Patient informed RN no problem with Betadine 08/04/2015   . Latex Rash    Reports facial rash from Cpap mask    Metabolic Disorder Labs: Lab Results  Component Value Date   HGBA1C 9.9 (H) 08/02/2015   MPG 237 08/02/2015   No results found for: PROLACTIN No results found for: CHOL, TRIG,  HDL, CHOLHDL, VLDL, LDLCALC Lab Results  Component Value Date   TSH 0.466 08/05/2015    Therapeutic Level Labs: No results found for: LITHIUM No results found for: CBMZ Lab Results  Component Value Date   VALPROATE 35 (L) 08/05/2015    Current Medications: Current Outpatient Medications  Medication Sig Dispense Refill  . acetaminophen (TYLENOL) 325 MG tablet Take 650 mg by mouth daily.    Marland Kitchen albuterol (PROAIR HFA) 108 (90 BASE) MCG/ACT inhaler Inhale 2 puffs into the lungs every 4 (four) hours as needed for wheezing or shortness of breath.     Marland Kitchen amLODipine (NORVASC) 10 MG tablet Take 10 mg by mouth daily.    . ARIPiprazole (ABILIFY) 2 MG tablet Take 2 mg daily by mouth.    Marland Kitchen atorvastatin (LIPITOR) 10 MG tablet Take 10 mg by mouth every other day.     . bismuth subsalicylate (PEPTO BISMOL) 262 MG/15ML suspension Take 30 mLs by mouth 2 (two) times daily as needed for indigestion.     . Calcium Carbonate Antacid (TUMS PO) Take 4 tablets by mouth 4 (four) times daily as needed (heartburn). Reported on 10/06/2015    . carbidopa-levodopa (SINEMET IR) 10-100 MG tablet Take 1 tablet 3 (three) times daily by mouth.    . clopidogrel (PLAVIX) 75 MG tablet Take 75 mg by mouth daily.    . cyanocobalamin (,VITAMIN B-12,) 1000 MCG/ML injection Inject 1,000 mcg into the muscle every 30 (thirty) days.    . divalproex (DEPAKOTE ER) 500 MG 24 hr tablet Take 500 mg by mouth 2 (two) times  daily.    . divalproex (DEPAKOTE) 500 MG DR tablet     . Fluticasone-Salmeterol (ADVAIR) 250-50 MCG/DOSE AEPB Inhale 1 puff into the lungs daily.    . folic acid (FOLVITE) 1 MG tablet Take 1 mg by mouth daily.    Marland Kitchen HYDROcodone-acetaminophen (NORCO/VICODIN) 5-325 MG tablet TK 1 T PO Q 6 H PRF MODERATE PAIN    . levothyroxine (SYNTHROID, LEVOTHROID) 50 MCG tablet Take 50 mcg daily before breakfast by mouth.    . MELATONIN PO Take 9 mg daily by mouth. 2 tablets at bedtime    . memantine (NAMENDA) 10 MG tablet Take 10 mg by mouth 2 (two) times daily. Reported on 10/06/2015    . metFORMIN (GLUCOPHAGE) 500 MG tablet Take 500 mg by mouth 2 (two) times daily with a meal.    . metFORMIN (GLUCOPHAGE-XR) 500 MG 24 hr tablet     . Multiple Vitamin (MULTIVITAMIN PO) Take 1 tablet by mouth daily.     . ondansetron (ZOFRAN) 4 MG tablet     . oxycodone (OXY-IR) 5 MG capsule Take 5 mg by mouth every 4 (four) hours as needed.    . pantoprazole (PROTONIX) 40 MG tablet Take 40 mg daily by mouth.    . QUEtiapine (SEROQUEL) 100 MG tablet TAKE 1 TABLET BY MOUTH AT BEDTIME AFTER 2 NIGHTS, MAY INCREASE TO 2 TABLETS BY MOUTH AT BEDTIME 60 tablet 5  . ranitidine (ZANTAC) 150 MG tablet Take 150 mg 2 (two) times daily by mouth.    . tamsulosin (FLOMAX) 0.4 MG CAPS capsule Take 0.4 mg by mouth daily.    . temazepam (RESTORIL) 15 MG capsule Take 1 capsule (15 mg total) by mouth at bedtime. Reported on 10/06/2015 30 capsule 5  . traMADol (ULTRAM) 50 MG tablet Take 50 mg by mouth every 6 (six) hours as needed.    . traZODone (DESYREL) 50 MG tablet Take 100  mg by mouth at bedtime. Reported on 10/06/2015     No current facility-administered medications for this visit.     Musculoskeletal: Strength & Muscle Tone:  Gait & Station:  Patient leans:   Psychiatric Specialty Exam: ROS  There were no vitals taken for this visit.There is no height or weight on file to calculate BMI.  General Appearance:   Eye Contact:    Speech:     Volume:    Mood:  Anxious  Affect:  Appropriate  Thought Process:  Coherent  Orientation:   Thought Content:    Suicidal Thoughts:    Homicidal Thoughts:  No  Memory:  We'll  Judgement:  Good  Insight:  Fair  Psychomotor Activity:  Normal  Concentration:  Concentration: Fair  Recall:  Good  Fund of Knowledge:Fair  Language: Good  Akathisia:  No  Handed:  Right  AIMS (if indicated):  not done  Assets:  Desire for Improvement  ADL's:  Impaired  Cognition: Impaired,  Mild  Sleep:  Good   Screenings: PHQ2-9     Patient Outreach from 07/13/2016 in [place]  PHQ-2 Total Score  6  PHQ-9 Total Score  27      Assessment and Plan:   At this time the patient is #1 problem is that of bipolar disorder manic type. At this time he'll continue taking Abilify 2 mg is actually down by 10 mg. We'll reduce his Seroquel from 200 mg down to 100 mg and will continue taking Restoril 15 mg for sleep. Patient is seen again in 2 and half months. Functions fairly well as long she has assistance from [place]. Patient is not suicidal.   Jerral Ralph, MD 9/9/20203:03 PM

## 2019-05-09 ENCOUNTER — Other Ambulatory Visit: Payer: Self-pay

## 2019-05-12 ENCOUNTER — Ambulatory Visit (INDEPENDENT_AMBULATORY_CARE_PROVIDER_SITE_OTHER): Payer: Medicare HMO | Admitting: Podiatry

## 2019-05-12 ENCOUNTER — Other Ambulatory Visit: Payer: Self-pay

## 2019-05-12 DIAGNOSIS — M79676 Pain in unspecified toe(s): Secondary | ICD-10-CM

## 2019-05-12 DIAGNOSIS — B351 Tinea unguium: Secondary | ICD-10-CM | POA: Diagnosis not present

## 2019-05-12 DIAGNOSIS — J449 Chronic obstructive pulmonary disease, unspecified: Secondary | ICD-10-CM | POA: Diagnosis not present

## 2019-05-12 DIAGNOSIS — D689 Coagulation defect, unspecified: Secondary | ICD-10-CM

## 2019-05-12 DIAGNOSIS — E119 Type 2 diabetes mellitus without complications: Secondary | ICD-10-CM | POA: Diagnosis not present

## 2019-05-12 NOTE — Progress Notes (Signed)
Subjective:  Patient ID: [name], male    DOB: 1941-01-25,  MRN: [id number]  Chief Complaint  Patient presents with  . debride    Diabetic nail trimming  . Diabetes    FBS: 100 A1C: 4.5 PCP: [facility] x 3 mo    78 y.o. male presents with the above complaint.  Reports painfully elongated nails to both feet. On plavix.  Review of Systems: Negative except as noted in the HPI. Denies N/V/F/Ch.  Past Medical History:  Diagnosis Date  . Anxiety    Xanax as needed  . Arthritis    spinal stenosis.  . Asthma   . Bipolar disorder ([place])   . Cataract   . Chronic kidney disease    since NSAID's stopped -function has improved  . COPD (chronic obstructive pulmonary disease) ([place])   . Crohn's colitis ([place])   . Depression    takes Cymbalta daily  . Diverticulosis 01/2007  . DM (diabetes mellitus) ([place])    takes Januvia now, metformin stopped due to diarrhea  . ED (erectile dysfunction)   . GERD (gastroesophageal reflux disease)    uses Tums  . Glaucoma    surgery to correct  . History of colon polyps   . HLD (hyperlipidemia)    takes Lipitor daily  . HTN (hypertension)    takes Altace and Diovan daily  . Impaired hearing    "getting this checked out on Monday"  . Insomnia   . Internal hemorrhoids 01/2007  . Joint pain   . Joint swelling   . Microscopic colitis   . Nocturia   . Obesity   . Pneumonia 3/09  . Sleep apnea    uses CPAP;has been greater than 19yrs since sleep study- Dr. Elsworth Soho    Current Outpatient Medications:  .  acetaminophen (TYLENOL) 325 MG tablet, Take 650 mg by mouth daily., Disp: , Rfl:  .  albuterol (PROAIR HFA) 108 (90 BASE) MCG/ACT inhaler, Inhale 2 puffs into the lungs every 4 (four) hours as needed for wheezing or shortness of breath. , Disp: , Rfl:  .  amLODipine (NORVASC) 10 MG tablet, Take 10 mg by mouth daily., Disp: , Rfl:  .  ARIPiprazole (ABILIFY) 2 MG tablet, Take 1 tablet (2 mg total) by mouth daily., Disp: 30 tablet, Rfl: 5 .   atorvastatin (LIPITOR) 10 MG tablet, Take 10 mg by mouth every other day. , Disp: , Rfl:  .  bismuth subsalicylate (PEPTO BISMOL) 262 MG/15ML suspension, Take 30 mLs by mouth 2 (two) times daily as needed for indigestion. , Disp: , Rfl:  .  Calcium Carbonate Antacid (TUMS PO), Take 4 tablets by mouth 4 (four) times daily as needed (heartburn). Reported on 10/06/2015, Disp: , Rfl:  .  carbidopa-levodopa (SINEMET IR) 10-100 MG tablet, Take 1 tablet 3 (three) times daily by mouth., Disp: , Rfl:  .  clopidogrel (PLAVIX) 75 MG tablet, Take 75 mg by mouth daily., Disp: , Rfl:  .  cyanocobalamin (,VITAMIN B-12,) 1000 MCG/ML injection, Inject 1,000 mcg into the muscle every 30 (thirty) days., Disp: , Rfl:  .  divalproex (DEPAKOTE ER) 500 MG 24 hr tablet, Take 500 mg by mouth 2 (two) times daily., Disp: , Rfl:  .  divalproex (DEPAKOTE) 500 MG DR tablet, , Disp: , Rfl:  .  Fluticasone-Salmeterol (ADVAIR) 250-50 MCG/DOSE AEPB, Inhale 1 puff into the lungs daily., Disp: , Rfl:  .  folic acid (FOLVITE) 1 MG tablet, Take 1 mg by mouth daily., Disp: , Rfl:  .  HYDROcodone-acetaminophen (NORCO/VICODIN) 5-325 MG tablet, TK 1 T PO Q 6 H PRF MODERATE PAIN, Disp: , Rfl:  .  levothyroxine (SYNTHROID) 25 MCG tablet, , Disp: , Rfl:  .  levothyroxine (SYNTHROID, LEVOTHROID) 50 MCG tablet, Take 50 mcg daily before breakfast by mouth., Disp: , Rfl:  .  MELATONIN PO, Take 9 mg daily by mouth. 2 tablets at bedtime, Disp: , Rfl:  .  memantine (NAMENDA) 10 MG tablet, Take 10 mg by mouth 2 (two) times daily. Reported on 10/06/2015, Disp: , Rfl:  .  metFORMIN (GLUCOPHAGE) 500 MG tablet, Take 500 mg by mouth 2 (two) times daily with a meal., Disp: , Rfl:  .  metFORMIN (GLUCOPHAGE-XR) 500 MG 24 hr tablet, , Disp: , Rfl:  .  Multiple Vitamin (MULTIVITAMIN PO), Take 1 tablet by mouth daily. , Disp: , Rfl:  .  ondansetron (ZOFRAN) 4 MG tablet, , Disp: , Rfl:  .  oxycodone (OXY-IR) 5 MG capsule, Take 5 mg by mouth every 4 (four) hours  as needed., Disp: , Rfl:  .  pantoprazole (PROTONIX) 40 MG tablet, Take 40 mg daily by mouth., Disp: , Rfl:  .  QUEtiapine (SEROQUEL) 100 MG tablet, 1  qhs, Disp: 30 tablet, Rfl: 5 .  ranitidine (ZANTAC) 150 MG tablet, Take 150 mg 2 (two) times daily by mouth., Disp: , Rfl:  .  tamsulosin (FLOMAX) 0.4 MG CAPS capsule, Take 0.4 mg by mouth daily., Disp: , Rfl:  .  temazepam (RESTORIL) 15 MG capsule, Take 1 capsule (15 mg total) by mouth at bedtime. Reported on 10/06/2015, Disp: 30 capsule, Rfl: 5 .  traMADol (ULTRAM) 50 MG tablet, Take 50 mg by mouth every 6 (six) hours as needed., Disp: , Rfl:  .  traZODone (DESYREL) 50 MG tablet, Take 100 mg by mouth at bedtime. Reported on 10/06/2015, Disp: , Rfl:   Social History   Tobacco Use  Smoking Status Never Smoker  Smokeless Tobacco Never Used    Allergies  Allergen Reactions  . Shellfish Allergy Anaphylaxis    Patient informed RN no problem with Betadine 08/04/2015   . Latex Rash    Reports facial rash from Cpap mask   Objective:   There were no vitals filed for this visit. There is no height or weight on file to calculate BMI. Constitutional Well developed. Well nourished.  Vascular Dorsalis pedis pulses palpable bilaterally. Posterior tibial pulses palpable bilaterally. Capillary refill normal to all digits.  No cyanosis or clubbing noted. Pedal hair growth normal.  Neurologic Normal speech. Oriented to person, place, and time. Epicritic sensation to light touch grossly present bilaterally. Vibratory sensation intact SWMF 5/5 bilat.  Dermatologic Nails elongated dystrophic pain to palpation No open wounds. No skin lesions.  Orthopedic: Normal joint ROM without pain or crepitus bilaterally. No visible deformities. No bony tenderness.   Radiographs: None Assessment:   1. Pain due to onychomycosis of nail   2. Coagulation defect [facility])    Plan:  Patient was evaluated and treated and all questions answered.  DM,  Onychomycosis with pain, coagulation defect -Nails debrided as below  Procedure: Nail Debridement Rationale: Patient meets criteria for routine foot care due to coag defect Type of Debridement: manual, sharp debridement. Instrumentation: Nail nipper, rotary burr. Number of Nails: 10   No follow-ups on file.

## 2019-05-19 DIAGNOSIS — E785 Hyperlipidemia, unspecified: Secondary | ICD-10-CM | POA: Diagnosis not present

## 2019-05-19 DIAGNOSIS — E538 Deficiency of other specified B group vitamins: Secondary | ICD-10-CM | POA: Diagnosis not present

## 2019-05-19 DIAGNOSIS — I10 Essential (primary) hypertension: Secondary | ICD-10-CM | POA: Diagnosis not present

## 2019-05-19 DIAGNOSIS — E039 Hypothyroidism, unspecified: Secondary | ICD-10-CM | POA: Diagnosis not present

## 2019-05-19 DIAGNOSIS — E1129 Type 2 diabetes mellitus with other diabetic kidney complication: Secondary | ICD-10-CM | POA: Diagnosis not present

## 2019-05-19 DIAGNOSIS — N183 Chronic kidney disease, stage 3 unspecified: Secondary | ICD-10-CM | POA: Diagnosis not present

## 2019-05-28 DIAGNOSIS — G20 Parkinson's disease: Secondary | ICD-10-CM | POA: Diagnosis not present

## 2019-05-28 DIAGNOSIS — I10 Essential (primary) hypertension: Secondary | ICD-10-CM | POA: Diagnosis not present

## 2019-05-28 DIAGNOSIS — F319 Bipolar disorder, unspecified: Secondary | ICD-10-CM | POA: Diagnosis not present

## 2019-05-28 DIAGNOSIS — E1122 Type 2 diabetes mellitus with diabetic chronic kidney disease: Secondary | ICD-10-CM | POA: Diagnosis not present

## 2019-05-28 DIAGNOSIS — E1129 Type 2 diabetes mellitus with other diabetic kidney complication: Secondary | ICD-10-CM | POA: Diagnosis not present

## 2019-05-28 DIAGNOSIS — F339 Major depressive disorder, recurrent, unspecified: Secondary | ICD-10-CM | POA: Diagnosis not present

## 2019-05-28 DIAGNOSIS — N183 Chronic kidney disease, stage 3 unspecified: Secondary | ICD-10-CM | POA: Diagnosis not present

## 2019-05-28 DIAGNOSIS — Z1331 Encounter for screening for depression: Secondary | ICD-10-CM | POA: Diagnosis not present

## 2019-05-28 DIAGNOSIS — G3109 Other frontotemporal dementia: Secondary | ICD-10-CM | POA: Diagnosis not present

## 2019-05-28 DIAGNOSIS — N1831 Chronic kidney disease, stage 3a: Secondary | ICD-10-CM | POA: Diagnosis not present

## 2019-05-28 DIAGNOSIS — Z0000 Encounter for general adult medical examination without abnormal findings: Secondary | ICD-10-CM | POA: Diagnosis not present

## 2019-05-28 DIAGNOSIS — E039 Hypothyroidism, unspecified: Secondary | ICD-10-CM | POA: Diagnosis not present

## 2019-05-28 DIAGNOSIS — E1149 Type 2 diabetes mellitus with other diabetic neurological complication: Secondary | ICD-10-CM | POA: Diagnosis not present

## 2019-05-28 DIAGNOSIS — E785 Hyperlipidemia, unspecified: Secondary | ICD-10-CM | POA: Diagnosis not present

## 2019-06-18 ENCOUNTER — Other Ambulatory Visit: Payer: Self-pay

## 2019-06-18 ENCOUNTER — Ambulatory Visit (HOSPITAL_COMMUNITY): Payer: Medicare HMO | Admitting: Psychiatry

## 2019-06-26 ENCOUNTER — Ambulatory Visit (HOSPITAL_COMMUNITY): Payer: Medicare HMO | Admitting: Psychiatry

## 2019-07-14 ENCOUNTER — Telehealth (INDEPENDENT_AMBULATORY_CARE_PROVIDER_SITE_OTHER): Payer: Medicare HMO | Admitting: Gastroenterology

## 2019-07-14 ENCOUNTER — Encounter: Payer: Self-pay | Admitting: Gastroenterology

## 2019-07-14 ENCOUNTER — Other Ambulatory Visit: Payer: Self-pay

## 2019-07-14 VITALS — Ht 66.0 in | Wt 173.0 lb

## 2019-07-14 DIAGNOSIS — R1084 Generalized abdominal pain: Secondary | ICD-10-CM | POA: Diagnosis not present

## 2019-07-14 DIAGNOSIS — R197 Diarrhea, unspecified: Secondary | ICD-10-CM | POA: Diagnosis not present

## 2019-07-14 MED ORDER — FAMOTIDINE 20 MG PO TABS: 20.0000 mg | ORAL_TABLET | Freq: Every day | ORAL | 11 refills | Status: DC

## 2019-07-14 MED ORDER — PANTOPRAZOLE SODIUM 40 MG PO TBEC: 40.0000 mg | DELAYED_RELEASE_TABLET | Freq: Two times a day (BID) | ORAL | 11 refills | Status: DC

## 2019-07-14 NOTE — Progress Notes (Signed)
Chief Complaint:   Referring Provider:  Marton Redwood, MD      ASSESSMENT AND PLAN;   #1.  GERD with 3 cm HH with Barrett's esophagus (S/P EGD 07/2017, no dysplasia).  Has intermittent N/V.  May have associated diabetic gastroparesis.  #2.  Generalized abdominal pain with N/V. R/O PSBO.  #3.  Diarrhea. H/O (microscopic) colitis (? Mild Crohn's) in past.  Diarrhea previously resolved when he was taken off Metformin.  Failed trial of mesalamine in past.  Plan: - Protonix 40mg  po bid. (1/2 hr before breakfast, before supper). #60, 11 refills. - Pepcid 20mg  po qhs. - Stop taking stool softeners. - Please obtain recent labs from (Dr Raul Del office).  Also please ask if he can be taken off Metformin with any alternative medications for diabetes (did help with diarrhea previously). - Stool studies for GI Pathogen (includes C. Diff), WBCs, culture,O&P, fecal elastase, fat and Calprotectin. - CT AP with p.o. and IV contrast (if creatinine is acceptable) - FU in Jan/feb 2020 (in person).  If still with diarrhea, may need trial of budesonide. - Please update his medications.     Addendum: Labs from Dr. Trena Platt office reviewed.  On 05/19/2019: Normal CBC with hemoglobin 16.5, WBC count 7.3, creatinine 1.4 with GFR 48 mL/min.  Normal LFTs with albumin 4.1, hemoglobin A1c 5.8, TSH 1.8, B12 850   HPI:    [name] Mikki Santee) is a 78 y.o. 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 " Eating Tums by the bottle" Associated with generalized abdominal pain, abdominal bloating Diarrhea 1-2/week.  Mostly watery, nonbloody, at times with incontinence.  He also would get constipated occasionally in between and would take stool softeners. No weight loss.  No skin rash.  No melena or hematochezia. Does not want to undergo colonoscopy. He is getting more and more forgetful and having progressive dementia.    Past GI work-up: -EGD 07/18/2017: 3 cm hiatal hernia, 5  cm Barrett's esophagus. Bx-negative for dysplasia. 11/2013: Barrett's esophagus, HH, mild gastritis. Bx- neg for dysplasia. Neg SB Bx for celiac. -CT AP with contrast 04/28/2016: No acute inflammation, sigmoid diverticulosis, coronary artery calcifications, enlarged prostate, penile implant. -Colonoscopy 12/2013: Poor preparation.  Patchy colitis throughout the colon.  Moderate sigmoid diverticulosis. Bx-focal active colitis.  No granulomas.  He refused further colonoscopies. Prev colon 02/14/2011: Mild patchy colitis. Nl TI. Bx-colitis ?  Etiology.  Could represent Crohn's.  Was given trial of mesalamine without any benefit.  Past Medical History:  Diagnosis Date  . Anxiety    Xanax as needed  . Arthritis    spinal stenosis.  . Asthma   . Barrett esophagus   . Bipolar disorder ([place])   . BPH (benign prostatic hyperplasia)   . Cataract   . Chronic kidney disease    since NSAID's stopped -function has improved  . COPD (chronic obstructive pulmonary disease) ([place])   . Crohn's colitis ([place])   . Depression    takes Cymbalta daily  . Diverticulosis 01/2007  . DM (diabetes mellitus) ([place])    takes Januvia now, metformin stopped due to diarrhea  . ED (erectile dysfunction)   . GERD (gastroesophageal reflux disease)    uses Tums  . Glaucoma    surgery to correct  . History of colon polyps   . HLD (hyperlipidemia)    takes Lipitor daily  . HTN (hypertension)    takes Altace and Diovan daily  . Impaired hearing    "  getting this checked out on Monday"  . Insomnia   . Internal hemorrhoids 01/2007  . Joint pain   . Joint swelling   . Microscopic colitis   . Nocturia   . Obesity   . Osteoarthritis   . Pneumonia 3/09  . Sleep apnea    uses CPAP;has been greater than 29yrs since sleep study- Dr. Elsworth Soho    Past Surgical History:  Procedure Laterality Date  . CATARACT EXTRACTION W/ INTRAOCULAR LENS  IMPLANT, BILATERAL  2011  . CHOLECYSTECTOMY  2011  . COLONOSCOPY  01/01/2014   Mild patchy  colitis of questionable importance. Moderate predominantly sigmoid diverticulosis. Small internal hemorrhoids. Limited examination due to quality of preparation.    . COLONOSCOPY W/ BIOPSIES  2008   Looks normal, biopsy suggested a chronic microscopic colitis with rare granulomas  . COLONOSCOPY W/ BIOPSIES  02/2011   diverticulosis, internal hemorrhoids, patchy chronic colitis on random bxs,   . ESOPHAGOGASTRODUODENOSCOPY  07/18/2017   Barretts esophagus. Small hiatal hernia.   Marland Kitchen EYE SURGERY     for glaucoma  . HERNIA REPAIR    . LUMBAR LAMINECTOMY/DECOMPRESSION MICRODISCECTOMY N/A 08/04/2015   Procedure: CENTRAL DECOMPRESSION  LUMBAR LAMINECTOMY L4-L5 ;  Surgeon: Latanya Maudlin, MD;  Location: [facility];  Service: Orthopedics;  Laterality: N/A;  . NOSE SURGERY  2011  . PENILE PROSTHESIS IMPLANT  2012  . ROTATOR CUFF REPAIR Bilateral    '12 -left, '13 -right  . SHOULDER ARTHROSCOPY  08/24/2011   Procedure: ARTHROSCOPY SHOULDER;  Surgeon: Metta Clines Supple;  Location: [facility];  Service: Orthopedics;  Laterality: Right;  RIGHT SHOULDER ARTHROSCOPY WITH SUBACROMINAL DECOMPRESSION AND DISTAL CLAVICAL RESECTION   . sleep apnea surgery  1997  . TONSILLECTOMY     as a child  . UMBILICAL HERNIA REPAIR  5+yrs ago    Family History  Problem Relation Age of Onset  . Esophageal cancer Paternal Grandmother   . Heart failure Mother   . Depression Mother   . Alzheimer's disease Brother   . Heart attack Father   . Pneumonia Father   . Colon cancer Neg Hx     Social History   Tobacco Use  . Smoking status: Never Smoker  . Smokeless tobacco: Never Used  Substance Use Topics  . Alcohol use: No  . Drug use: No    Current Outpatient Medications  Medication Sig Dispense Refill  . acetaminophen (TYLENOL) 325 MG tablet Take 650 mg by mouth daily.    Marland Kitchen albuterol (PROAIR HFA) 108 (90 BASE) MCG/ACT inhaler Inhale 2 puffs into the lungs every 4 (four) hours as needed for wheezing or shortness of breath.      Marland Kitchen amLODipine (NORVASC) 10 MG tablet Take 10 mg by mouth daily.    . ARIPiprazole (ABILIFY) 2 MG tablet Take 1 tablet (2 mg total) by mouth daily. 30 tablet 5  . atorvastatin (LIPITOR) 10 MG tablet Take 10 mg by mouth every other day.     . bismuth subsalicylate (PEPTO BISMOL) 262 MG/15ML suspension Take 30 mLs by mouth 2 (two) times daily as needed for indigestion.     . Calcium Carbonate Antacid (TUMS PO) Take 4 tablets by mouth 4 (four) times daily as needed (heartburn). Reported on 10/06/2015    . carbidopa-levodopa (SINEMET IR) 10-100 MG tablet Take 1 tablet 3 (three) times daily by mouth.    . clopidogrel (PLAVIX) 75 MG tablet Take 75 mg by mouth daily.    . cyanocobalamin (,VITAMIN B-12,) 1000 MCG/ML injection Inject  1,000 mcg into the muscle every 30 (thirty) days.    . divalproex (DEPAKOTE ER) 500 MG 24 hr tablet Take 500 mg by mouth 2 (two) times daily.    . Fluticasone-Salmeterol (ADVAIR) 250-50 MCG/DOSE AEPB Inhale 1 puff into the lungs daily.    . folic acid (FOLVITE) 1 MG tablet Take 1 mg by mouth daily.    Marland Kitchen HYDROcodone-acetaminophen (NORCO/VICODIN) 5-325 MG tablet TK 1 T PO Q 6 H PRF MODERATE PAIN    . levothyroxine (SYNTHROID) 25 MCG tablet     . levothyroxine (SYNTHROID, LEVOTHROID) 50 MCG tablet Take 50 mcg daily before breakfast by mouth.    . MELATONIN PO Take 9 mg daily by mouth. 2 tablets at bedtime    . memantine (NAMENDA) 10 MG tablet Take 10 mg by mouth 2 (two) times daily. Reported on 10/06/2015    . metFORMIN (GLUCOPHAGE) 500 MG tablet Take 500 mg by mouth 2 (two) times daily with a meal.    . Multiple Vitamin (MULTIVITAMIN PO) Take 1 tablet by mouth daily.     . ondansetron (ZOFRAN) 4 MG tablet     . oxycodone (OXY-IR) 5 MG capsule Take 5 mg by mouth every 4 (four) hours as needed.    . pantoprazole (PROTONIX) 40 MG tablet Take 40 mg daily by mouth.    . QUEtiapine (SEROQUEL) 100 MG tablet 1  qhs 30 tablet 5  . tamsulosin (FLOMAX) 0.4 MG CAPS capsule Take 0.4 mg by  mouth daily.    . temazepam (RESTORIL) 15 MG capsule Take 1 capsule (15 mg total) by mouth at bedtime. Reported on 10/06/2015 30 capsule 5  . traMADol (ULTRAM) 50 MG tablet Take 50 mg by mouth every 6 (six) hours as needed.    . traZODone (DESYREL) 50 MG tablet Take 100 mg by mouth at bedtime. Reported on 10/06/2015     No current facility-administered medications for this visit.     Allergies  Allergen Reactions  . Shellfish Allergy Anaphylaxis    Patient informed RN no problem with Betadine 08/04/2015   . Latex Rash    Reports facial rash from Cpap mask    Review of Systems:  neg     Physical Exam:    Ht 5\' 6"  (1.676 m)   Wt 173 lb (78.5 kg)   BMI 27.92 kg/m  Filed Weights   07/14/19 0857  Weight: 173 lb (78.5 kg)  Not examined since it was a televisit.  Data Reviewed: I have personally reviewed following labs and imaging studies  CBC: CBC Latest Ref Rng & Units 08/18/2011 09/05/2010 11/24/2009  WBC 4.0 - 10.5 K/uL 9.5 7.6 8.2  Hemoglobin 13.0 - 17.0 g/dL 15.5 16.3 15.2  Hematocrit 39.0 - 52.0 % 46.6 49.6 46.2  Platelets 150 - 400 K/uL 199 212 182    CMP: CMP Latest Ref Rng & Units 08/05/2015 08/02/2015 08/18/2011  Glucose 65 - 99 mg/dL 187(H) 294(H) 97  BUN 6 - 20 mg/dL 20 20 19   Creatinine 0.61 - 1.24 mg/dL 1.45(H) 1.46(H) 1.28  Sodium 135 - 145 mmol/L 140 141 143  Potassium 3.5 - 5.1 mmol/L 4.1 4.6 4.5  Chloride 101 - 111 mmol/L 104 104 107  CO2 22 - 32 mmol/L 27 28 28   Calcium 8.9 - 10.3 mg/dL 8.7(L) 9.5 9.9  Total Protein 6.5 - 8.1 g/dL 6.0(L) 6.7 -  Total Bilirubin 0.3 - 1.2 mg/dL 1.0 0.6 -  Alkaline Phos 38 - 126 U/L 59 73 -  AST 15 -  41 U/L 25 19 -  ALT 17 - 63 U/L 18 16(L) -  This service was provided via telemedicine.  The patient was located at home.  The provider was located in office.  The patient did consent to this telephone visit and is aware of possible charges through their insurance for this visit.  The patient was referred by Dr. Brigitte Pulse.  The other  persons participating in this telemedicine service were patient's wife and their role was coordination of care.  Time spent on call/coordination of care: 25 min     Carmell Austria, MD 07/14/2019, 2:03 PM  Cc: Marton Redwood, MD

## 2019-07-14 NOTE — Patient Instructions (Addendum)
If you are age 76 or older, your body mass index should be between 23-30. Your Body mass index is 27.92 kg/m. If this is out of the aforementioned range listed, please consider follow up with your Primary Care Provider.  If you are age 48 or younger, your body mass index should be between 19-25. Your Body mass index is 27.92 kg/m. If this is out of the aformentioned range listed, please consider follow up with your Primary Care Provider.   You have been scheduled for a CT Scan at [facility] on 07/17/19 at 8:30am. Please go pick up your instructions and contrast before the day of your CT scan between 8am-4pm.   Please go to [facility] and have your labs drawn before the day of your CT Scan.   We have sent the following medications to your pharmacy for you to pick up at your convenience: Protonix  Pepcid   Follow up in January.   Thank you,  Dr. Jackquline Denmark

## 2019-07-15 NOTE — Addendum Note (Signed)
Addended by: Karena Addison on: 07/15/2019 09:36 AM   Modules accepted: Orders

## 2019-07-17 ENCOUNTER — Encounter: Payer: Self-pay | Admitting: Gastroenterology

## 2019-07-17 DIAGNOSIS — R109 Unspecified abdominal pain: Secondary | ICD-10-CM | POA: Diagnosis not present

## 2019-07-17 DIAGNOSIS — R197 Diarrhea, unspecified: Secondary | ICD-10-CM | POA: Diagnosis not present

## 2019-07-17 DIAGNOSIS — R1084 Generalized abdominal pain: Secondary | ICD-10-CM | POA: Diagnosis not present

## 2019-07-23 ENCOUNTER — Ambulatory Visit (INDEPENDENT_AMBULATORY_CARE_PROVIDER_SITE_OTHER): Payer: Medicare HMO | Admitting: Psychiatry

## 2019-07-23 ENCOUNTER — Other Ambulatory Visit: Payer: Self-pay

## 2019-07-23 DIAGNOSIS — F3130 Bipolar disorder, current episode depressed, mild or moderate severity, unspecified: Secondary | ICD-10-CM

## 2019-07-23 MED ORDER — PAROXETINE HCL 10 MG PO TABS: ORAL_TABLET | ORAL | 4 refills | Status: DC

## 2019-07-23 MED ORDER — LORAZEPAM 1 MG PO TABS: ORAL_TABLET | ORAL | 4 refills | Status: DC

## 2019-07-23 MED ORDER — SERTRALINE HCL 100 MG PO TABS: ORAL_TABLET | ORAL | 5 refills | Status: DC

## 2019-07-23 NOTE — Addendum Note (Signed)
Addended by: Norma Fredrickson on: 07/23/2019 04:36 PM   Modules accepted: Orders

## 2019-07-23 NOTE — Progress Notes (Signed)
Psychiatric Initial Adult Assessment   Patient Identification: [name] MRN:  [id number] Date of Evaluation:  07/23/2019 Referral Source: Dr Brigitte Pulse Chief Complaint:   Visit Diagnosis: Bipolar Depressed  History of Present Illness:    Today the patient was interviewed along with his caregiver [place].  According to his caregiver the patient is clearly declining.  The patient himself admits that he feels depressed.  Is having a hard time functioning.  He falls out of the bed.  Fortunately he does not fall with his walking.  He acknowledges that he is sad and down.  He actually is sleeping fairly well but is eating has lessened.  He has not lost weight in the last month but overall he is enjoying things much less.  He gets little out of enjoyment of what things he used to enjoy.  He has no psychosis.  He denies the use of any use of alcohol or drugs.  He clearly has declined.  He denies chest pain shortness of breath or any physical complaints.  He is having problem with incontinence of his urine in his stool.  He is seeing a neurologist this week.  He is having a lot of physical complaints and feels malaise.  He acknowledges that he is overmedicated. Associated Signs/Symptoms: Depression Symptoms:  depressed mood, insomnia,(Hypoymptoms:   Anxiety Symptoms:   Psychotic Symptoms:   PTSD Symptoms:   Past Psychiatric History:  Multiple psychiatric medicines   Previous Psychotropic Medications: Yes   Substance Abuse History in the last 12 months:    Consequences of Substance Abuse:   Past Medical History:  Past Medical History:  Diagnosis Date  . Anxiety    Xanax as needed  . Arthritis    spinal stenosis.  . Asthma   . Barrett esophagus   . Bipolar disorder ([place])   . BPH (benign prostatic hyperplasia)   . Cataract   . Chronic kidney disease    since NSAID's stopped -function has improved  . COPD (chronic obstructive pulmonary disease) ([place])   . Crohn's colitis ([place])   .  Depression    takes Cymbalta daily  . Diverticulosis 01/2007  . DM (diabetes mellitus) ([place])    takes Januvia now, metformin stopped due to diarrhea  . ED (erectile dysfunction)   . GERD (gastroesophageal reflux disease)    uses Tums  . Glaucoma    surgery to correct  . History of colon polyps   . HLD (hyperlipidemia)    takes Lipitor daily  . HTN (hypertension)    takes Altace and Diovan daily  . Impaired hearing    "getting this checked out on Monday"  . Insomnia   . Internal hemorrhoids 01/2007  . Joint pain   . Joint swelling   . Microscopic colitis   . Nocturia   . Obesity   . Osteoarthritis   . Pneumonia 3/09  . Sleep apnea    uses CPAP;has been greater than 84yrs since sleep study- Dr. Elsworth Soho    Past Surgical History:  Procedure Laterality Date  . CATARACT EXTRACTION W/ INTRAOCULAR LENS  IMPLANT, BILATERAL  2011  . CHOLECYSTECTOMY  2011  . COLONOSCOPY  01/01/2014   Mild patchy colitis of questionable importance. Moderate predominantly sigmoid diverticulosis. Small internal hemorrhoids. Limited examination due to quality of preparation.    . COLONOSCOPY W/ BIOPSIES  2008   Looks normal, biopsy suggested a chronic microscopic colitis with rare granulomas  . COLONOSCOPY W/ BIOPSIES  02/2011   diverticulosis, internal hemorrhoids, patchy  chronic colitis on random bxs,   . ESOPHAGOGASTRODUODENOSCOPY  07/18/2017   Barretts esophagus. Small hiatal hernia.   Marland Kitchen EYE SURGERY     for glaucoma  . HERNIA REPAIR    . LUMBAR LAMINECTOMY/DECOMPRESSION MICRODISCECTOMY N/A 08/04/2015   Procedure: CENTRAL DECOMPRESSION  LUMBAR LAMINECTOMY L4-L5 ;  Surgeon: Latanya Maudlin, MD;  Location: [facility];  Service: Orthopedics;  Laterality: N/A;  . NOSE SURGERY  2011  . PENILE PROSTHESIS IMPLANT  2012  . ROTATOR CUFF REPAIR Bilateral    '12 -left, '13 -right  . SHOULDER ARTHROSCOPY  08/24/2011   Procedure: ARTHROSCOPY SHOULDER;  Surgeon: Metta Clines Supple;  Location: [facility];  Service: Orthopedics;   Laterality: Right;  RIGHT SHOULDER ARTHROSCOPY WITH SUBACROMINAL DECOMPRESSION AND DISTAL CLAVICAL RESECTION   . sleep apnea surgery  1997  . TONSILLECTOMY     as a child  . UMBILICAL HERNIA REPAIR  5+yrs ago    Family Psychiatric History:   Family History:  Family History  Problem Relation Age of Onset  . Esophageal cancer Paternal Grandmother   . Heart failure Mother   . Depression Mother   . Alzheimer's disease Brother   . Heart attack Father   . Pneumonia Father   . Colon cancer Neg Hx     Social History:   Social History   Socioeconomic History  . Marital status: Widowed    Spouse name: Not on file  . Number of children: Not on file  . Years of education: Not on file  . Highest education level: Not on file  Occupational History  . Occupation: retired    Comment: Paramedic  Social Needs  . Financial resource strain: Not on file  . Food insecurity    Worry: Not on file    Inability: Not on file  . Transportation needs    Medical: Not on file    Non-medical: Not on file  Tobacco Use  . Smoking status: Never Smoker  . Smokeless tobacco: Never Used  Substance and Sexual Activity  . Alcohol use: No  . Drug use: No  . Sexual activity: Not Currently  Lifestyle  . Physical activity    Days per week: Not on file    Minutes per session: Not on file  . Stress: Not on file  Relationships  . Social Herbalist on phone: Not on file    Gets together: Not on file    Attends religious service: Not on file    Active member of club or organization: Not on file    Attends meetings of clubs or organizations: Not on file    Relationship status: Not on file  Other Topics Concern  . 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 Shellfish Allergy Anaphylaxis    Patient informed RN  no problem with Betadine 08/04/2015   . Latex Rash    Reports facial rash from Cpap mask    Metabolic Disorder Labs: Lab Results  Component Value Date   HGBA1C 9.9 (H) 08/02/2015   MPG 237 08/02/2015   No results found for: PROLACTIN No results found for: CHOL, TRIG, HDL, CHOLHDL, VLDL, LDLCALC Lab Results  Component Value Date   TSH 0.466 08/05/2015    Therapeutic Level Labs: No results found for: LITHIUM No results found for:  CBMZ Lab Results  Component Value Date   VALPROATE 35 (L) 08/05/2015    Current Medications: Current Outpatient Medications  Medication Sig Dispense Refill  . acetaminophen (TYLENOL) 325 MG tablet Take 650 mg by mouth daily.    Marland Kitchen albuterol (PROAIR HFA) 108 (90 BASE) MCG/ACT inhaler Inhale 2 puffs into the lungs every 4 (four) hours as needed for wheezing or shortness of breath.     Marland Kitchen amLODipine (NORVASC) 10 MG tablet Take 10 mg by mouth daily.    . ARIPiprazole (ABILIFY) 2 MG tablet Take 1 tablet (2 mg total) by mouth daily. 30 tablet 5  . atorvastatin (LIPITOR) 10 MG tablet Take 10 mg by mouth every other day.     . bismuth subsalicylate (PEPTO BISMOL) 262 MG/15ML suspension Take 30 mLs by mouth 2 (two) times daily as needed for indigestion.     . Calcium Carbonate Antacid (TUMS PO) Take 4 tablets by mouth 4 (four) times daily as needed (heartburn). Reported on 10/06/2015    . carbidopa-levodopa (SINEMET IR) 10-100 MG tablet Take 1 tablet 3 (three) times daily by mouth.    . clopidogrel (PLAVIX) 75 MG tablet Take 75 mg by mouth daily.    . cyanocobalamin (,VITAMIN B-12,) 1000 MCG/ML injection Inject 1,000 mcg into the muscle every 30 (thirty) days.    . divalproex (DEPAKOTE ER) 500 MG 24 hr tablet Take 500 mg by mouth 2 (two) times daily.    . famotidine (PEPCID) 20 MG tablet Take 1 tablet (20 mg total) by mouth at bedtime. 30 tablet 11  . Fluticasone-Salmeterol (ADVAIR) 250-50 MCG/DOSE AEPB Inhale 1 puff into the lungs daily.    . folic acid  (FOLVITE) 1 MG tablet Take 1 mg by mouth daily.    Marland Kitchen HYDROcodone-acetaminophen (NORCO/VICODIN) 5-325 MG tablet TK 1 T PO Q 6 H PRF MODERATE PAIN    . levothyroxine (SYNTHROID) 25 MCG tablet     . levothyroxine (SYNTHROID, LEVOTHROID) 50 MCG tablet Take 50 mcg daily before breakfast by mouth.    Marland Kitchen LORazepam (ATIVAN) 1 MG tablet 1  qhs 30 tablet 4  . MELATONIN PO Take 9 mg daily by mouth. 2 tablets at bedtime    . memantine (NAMENDA) 10 MG tablet Take 10 mg by mouth 2 (two) times daily. Reported on 10/06/2015    . metFORMIN (GLUCOPHAGE) 500 MG tablet Take 500 mg by mouth 2 (two) times daily with a meal.    . Multiple Vitamin (MULTIVITAMIN PO) Take 1 tablet by mouth daily.     . ondansetron (ZOFRAN) 4 MG tablet     . oxycodone (OXY-IR) 5 MG capsule Take 5 mg by mouth every 4 (four) hours as needed.    . pantoprazole (PROTONIX) 40 MG tablet Take 1 tablet (40 mg total) by mouth 2 (two) times daily. 60 tablet 11  . sertraline (ZOLOFT) 100 MG tablet Half qam  For 6 mornings the 1 qam 30 tablet 5  . tamsulosin (FLOMAX) 0.4 MG CAPS capsule Take 0.4 mg by mouth daily.    . traMADol (ULTRAM) 50 MG tablet Take 50 mg by mouth every 6 (six) hours as needed.     No current facility-administered medications for this visit.     Musculoskeletal: Strength & Muscle Tone:  Gait & Station:  Patient leans:   Psychiatric Specialty Exam: ROS  There were no vitals taken for this visit.There is no height or weight on file to calculate BMI.  General Appearance:   Eye Contact:  Speech:    Volume:    Mood:    Affect:    Thought Process:  Goal Directed  Orientation:  Full (Time, Place, and Person)  Thought Content:     Suicidal Thoughts:  No  Homicidal Thoughts:  No  Memory:  NA  Judgement:  Good  Insight:  Fair  Psychomotor Activity:  Normal  Concentration:    Recall:  Good  Fund of Knowledge:  Language:   Akathisia:    Handed:    AIMS (if indicated):  not done  Assets:  Desire for Improvement   ADL's:  Impaired  Cognition: Impaired,  Mild  Sleep:  Fair   Screenings: PHQ2-9     Patient Outreach from 07/13/2016 in [place]  PHQ-2 Total Score  6  PHQ-9 Total Score  27      Assessment and Plan:   This patient's historical diagnosis is bipolar disorder depressed type.  At this time we will go ahead and taper and discontinue his Seroquel.  He is taking 100 mg and will change it to 50 mg for a few days and discontinue it.  We will discontinue his Restoril and change to Ativan 1 mg.  He was taking this for insomnia.  He will continue taking a low-dose of Abilify.  He will begin taking Zoloft and build to 100 mg a day.  His caregiver says he has no energy.  He is not engaging in much at all.  He appears very sad and admits to being depressed.  In my care of this individual I have never seen features of mania in this individual.  I think the Seroquel will be more detrimental.  The patient therefore will come off Seroquel come off Restoril use 1 mg of Ativan for sleep start Zoloft and continue low-dose Abilify.  He is not suicidal.  Jerral Ralph, MD 12/9/20204:19 PM

## 2019-07-24 DIAGNOSIS — N3020 Other chronic cystitis without hematuria: Secondary | ICD-10-CM | POA: Diagnosis not present

## 2019-07-24 DIAGNOSIS — N401 Enlarged prostate with lower urinary tract symptoms: Secondary | ICD-10-CM | POA: Diagnosis not present

## 2019-07-24 DIAGNOSIS — N318 Other neuromuscular dysfunction of bladder: Secondary | ICD-10-CM | POA: Diagnosis not present

## 2019-07-24 DIAGNOSIS — N3090 Cystitis, unspecified without hematuria: Secondary | ICD-10-CM | POA: Diagnosis not present

## 2019-07-24 DIAGNOSIS — R972 Elevated prostate specific antigen [PSA]: Secondary | ICD-10-CM | POA: Diagnosis not present

## 2019-07-28 ENCOUNTER — Telehealth (HOSPITAL_COMMUNITY): Payer: Self-pay

## 2019-07-28 NOTE — Telephone Encounter (Signed)
Medicaiton management - prior authorizaiton submitted through covermymeds for prescribed Paxil.  Verified with Dr. Casimiro Needle patient taking this instead of Zolft as had problems with Zoloft in pt's history.  Prior Authorization submitted and pending results.

## 2019-07-30 NOTE — Telephone Encounter (Signed)
[employer] PRESCRIPTION COVERAGE APPROVED  PAROXETINE 10MG  TABLET EFFECTIVE 07/30/2019 TO 08/13/2020

## 2019-08-11 ENCOUNTER — Ambulatory Visit: Payer: Medicare HMO | Admitting: Podiatry

## 2019-08-25 ENCOUNTER — Ambulatory Visit: Payer: Medicare HMO | Admitting: Podiatry

## 2019-08-28 DIAGNOSIS — N401 Enlarged prostate with lower urinary tract symptoms: Secondary | ICD-10-CM | POA: Diagnosis not present

## 2019-08-28 DIAGNOSIS — R350 Frequency of micturition: Secondary | ICD-10-CM | POA: Diagnosis not present

## 2019-08-28 DIAGNOSIS — N3020 Other chronic cystitis without hematuria: Secondary | ICD-10-CM | POA: Diagnosis not present

## 2019-09-01 ENCOUNTER — Telehealth (HOSPITAL_COMMUNITY): Payer: Self-pay

## 2019-09-01 NOTE — Telephone Encounter (Signed)
Patient's significant other (Loreta Linebarrier) called and stated that the Paroxetine 10mg  he was put on has made him "crazy" so she put him back on Seroquel. She stated that you can call her at [phone] if you'd like to speak with her.  Please review and advise. Thank you.

## 2019-09-04 ENCOUNTER — Other Ambulatory Visit (HOSPITAL_COMMUNITY): Payer: Self-pay | Admitting: Psychiatry

## 2019-09-10 ENCOUNTER — Ambulatory Visit (HOSPITAL_COMMUNITY): Payer: Medicare HMO | Admitting: Psychiatry

## 2019-09-18 DIAGNOSIS — N3020 Other chronic cystitis without hematuria: Secondary | ICD-10-CM | POA: Diagnosis not present

## 2019-09-18 DIAGNOSIS — N3281 Overactive bladder: Secondary | ICD-10-CM | POA: Diagnosis not present

## 2019-09-20 ENCOUNTER — Other Ambulatory Visit (HOSPITAL_COMMUNITY): Payer: Self-pay | Admitting: Psychiatry

## 2019-09-24 DIAGNOSIS — C4442 Squamous cell carcinoma of skin of scalp and neck: Secondary | ICD-10-CM | POA: Diagnosis not present

## 2019-09-24 DIAGNOSIS — L219 Seborrheic dermatitis, unspecified: Secondary | ICD-10-CM | POA: Diagnosis not present

## 2019-09-24 DIAGNOSIS — L570 Actinic keratosis: Secondary | ICD-10-CM | POA: Diagnosis not present

## 2019-09-24 DIAGNOSIS — L578 Other skin changes due to chronic exposure to nonionizing radiation: Secondary | ICD-10-CM | POA: Diagnosis not present

## 2019-10-01 ENCOUNTER — Ambulatory Visit (INDEPENDENT_AMBULATORY_CARE_PROVIDER_SITE_OTHER): Payer: Medicare HMO | Admitting: Psychiatry

## 2019-10-01 ENCOUNTER — Other Ambulatory Visit: Payer: Self-pay

## 2019-10-01 DIAGNOSIS — F3130 Bipolar disorder, current episode depressed, mild or moderate severity, unspecified: Secondary | ICD-10-CM

## 2019-10-01 MED ORDER — LORAZEPAM 1 MG PO TABS: ORAL_TABLET | ORAL | 4 refills | Status: DC

## 2019-10-01 MED ORDER — ARIPIPRAZOLE 2 MG PO TABS: 2.0000 mg | ORAL_TABLET | Freq: Every day | ORAL | 5 refills | Status: DC

## 2019-10-01 MED ORDER — QUETIAPINE FUMARATE 100 MG PO TABS: 100.0000 mg | ORAL_TABLET | Freq: Every day | ORAL | 4 refills | Status: DC

## 2019-10-01 NOTE — Progress Notes (Signed)
Psychiatric Initial Adult Assessment   Patient Identification: [name] MRN:  [id number] Date of Evaluation:  10/01/2019 Referral Source: Dr Brigitte Pulse Chief Complaint:   Visit Diagnosis: Bipolar Depressed  History of Present Illness:    Today the patient is doing better.  We spoke to him and Loreta  his caregiver.  The patient's mood is definitely improved.  It turns out that he could not tolerate the Zoloft.  They believed it made him sedated or irritated her in some way emotionally upset.  Note is that he is off Paxil.  They went back to Seroquel 100 mg which seems to work very well.  The patient is not oversedated.  He is not had any meaningful falls.  He is sleeping and eating well.  He watches TV and enjoys himself.  Overall he is doing well.  He recently however was diagnosed with a skin cancer but it seems to be accepting it.  The patient spends a lot of time by himself watching TV.  We successfully changed his Restoril to Ativan.  He is not psychotic.  He drinks no alcohol.  He seems to be stable.  He is getting good medical care. Depression Symptoms:  depressed mood, insomnia,(Hypoymptoms:   Anxiety Symptoms:   Psychotic Symptoms:   PTSD Symptoms:   Past Psychiatric History:  Multiple psychiatric medicines   Previous Psychotropic Medications: Yes   Substance Abuse History in the last 12 months:    Consequences of Substance Abuse:   Past Medical History:  Past Medical History:  Diagnosis Date  . Anxiety    Xanax as needed  . Arthritis    spinal stenosis.  . Asthma   . Barrett esophagus   . Bipolar disorder ([place])   . BPH (benign prostatic hyperplasia)   . Cataract   . Chronic kidney disease    since NSAID's stopped -function has improved  . COPD (chronic obstructive pulmonary disease) ([place])   . Crohn's colitis ([place])   . Depression    takes Cymbalta daily  . Diverticulosis 01/2007  . DM (diabetes mellitus) ([place])    takes Januvia now, metformin stopped due to  diarrhea  . ED (erectile dysfunction)   . GERD (gastroesophageal reflux disease)    uses Tums  . Glaucoma    surgery to correct  . History of colon polyps   . HLD (hyperlipidemia)    takes Lipitor daily  . HTN (hypertension)    takes Altace and Diovan daily  . Impaired hearing    "getting this checked out on Monday"  . Insomnia   . Internal hemorrhoids 01/2007  . Joint pain   . Joint swelling   . Microscopic colitis   . Nocturia   . Obesity   . Osteoarthritis   . Pneumonia 3/09  . Sleep apnea    uses CPAP;has been greater than 71yrs since sleep study- Dr. Elsworth Soho    Past Surgical History:  Procedure Laterality Date  . CATARACT EXTRACTION W/ INTRAOCULAR LENS  IMPLANT, BILATERAL  2011  . CHOLECYSTECTOMY  2011  . COLONOSCOPY  01/01/2014   Mild patchy colitis of questionable importance. Moderate predominantly sigmoid diverticulosis. Small internal hemorrhoids. Limited examination due to quality of preparation.    . COLONOSCOPY W/ BIOPSIES  2008   Looks normal, biopsy suggested a chronic microscopic colitis with rare granulomas  . COLONOSCOPY W/ BIOPSIES  02/2011   diverticulosis, internal hemorrhoids, patchy chronic colitis on random bxs,   . ESOPHAGOGASTRODUODENOSCOPY  07/18/2017   Barretts esophagus. Small  hiatal hernia.   Marland Kitchen EYE SURGERY     for glaucoma  . HERNIA REPAIR    . LUMBAR LAMINECTOMY/DECOMPRESSION MICRODISCECTOMY N/A 08/04/2015   Procedure: CENTRAL DECOMPRESSION  LUMBAR LAMINECTOMY L4-L5 ;  Surgeon: Latanya Maudlin, MD;  Location: [facility];  Service: Orthopedics;  Laterality: N/A;  . NOSE SURGERY  2011  . PENILE PROSTHESIS IMPLANT  2012  . ROTATOR CUFF REPAIR Bilateral    '12 -left, '13 -right  . SHOULDER ARTHROSCOPY  08/24/2011   Procedure: ARTHROSCOPY SHOULDER;  Surgeon: Metta Clines Supple;  Location: [facility];  Service: Orthopedics;  Laterality: Right;  RIGHT SHOULDER ARTHROSCOPY WITH SUBACROMINAL DECOMPRESSION AND DISTAL CLAVICAL RESECTION   . sleep apnea surgery  1997  .  TONSILLECTOMY     as a child  . UMBILICAL HERNIA REPAIR  5+yrs ago    Family Psychiatric History:   Family History:  Family History  Problem Relation Age of Onset  . Esophageal cancer Paternal Grandmother   . Heart failure Mother   . Depression Mother   . Alzheimer's disease Brother   . Heart attack Father   . Pneumonia Father   . Colon cancer Neg Hx     Social History:   Social History   Socioeconomic History  . Marital status: Widowed    Spouse name: Not on file  . Number of children: Not on file  . Years of education: Not on file  . Highest education level: Not on file  Occupational History  . Occupation: retired    Comment: Paramedic  Tobacco Use  . Smoking status: Never Smoker  . Smokeless tobacco: Never Used  Substance and Sexual Activity  . Alcohol use: No  . Drug use: No  . Sexual activity: Not Currently  Other Topics Concern  . 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 Difficulty of Paying Living Expenses: Not on file  Food Insecurity:   . Worried About Charity fundraiser in the Last Year: Not on file  . Ran Out of Food in the Last Year: Not on file  Transportation Needs:   . Lack of Transportation (Medical): Not on file  . Lack of Transportation (Non-Medical): Not on file  Physical Activity:   . Days of Exercise per Week: Not on file  . Minutes of Exercise per Session: Not on file  Stress:   . Feeling of Stress : Not on file  Social Connections:   . Frequency of Communication with Friends and Family: Not on file  . Frequency of Social Gatherings with Friends and Family: Not on file  . Attends Religious Services: Not on file  . Active Member of Clubs or Organizations: Not on file  . Attends Archivist Meetings: Not on file  . Marital Status: Not on file    Additional  Social History:    Allergies:   Allergies  Allergen Reactions  . Shellfish Allergy Anaphylaxis    Patient informed RN no problem with Betadine 08/04/2015   . Latex Rash    Reports facial rash from Cpap mask    Metabolic Disorder Labs: Lab Results  Component Value Date   HGBA1C 9.9 (H) 08/02/2015   MPG 237 08/02/2015   No results found for: PROLACTIN No results found for: CHOL, TRIG, HDL, CHOLHDL, VLDL, LDLCALC Lab Results  Component Value Date  TSH 0.466 08/05/2015    Therapeutic Level Labs: No results found for: LITHIUM No results found for: CBMZ Lab Results  Component Value Date   VALPROATE 35 (L) 08/05/2015    Current Medications: Current Outpatient Medications  Medication Sig Dispense Refill  . acetaminophen (TYLENOL) 325 MG tablet Take 650 mg by mouth daily.    Marland Kitchen albuterol (PROAIR HFA) 108 (90 BASE) MCG/ACT inhaler Inhale 2 puffs into the lungs every 4 (four) hours as needed for wheezing or shortness of breath.     Marland Kitchen amLODipine (NORVASC) 10 MG tablet Take 10 mg by mouth daily.    . ARIPiprazole (ABILIFY) 2 MG tablet Take 1 tablet (2 mg total) by mouth daily. 30 tablet 5  . atorvastatin (LIPITOR) 10 MG tablet Take 10 mg by mouth every other day.     . bismuth subsalicylate (PEPTO BISMOL) 262 MG/15ML suspension Take 30 mLs by mouth 2 (two) times daily as needed for indigestion.     . Calcium Carbonate Antacid (TUMS PO) Take 4 tablets by mouth 4 (four) times daily as needed (heartburn). Reported on 10/06/2015    . carbidopa-levodopa (SINEMET IR) 10-100 MG tablet Take 1 tablet 3 (three) times daily by mouth.    . clopidogrel (PLAVIX) 75 MG tablet Take 75 mg by mouth daily.    . cyanocobalamin (,VITAMIN B-12,) 1000 MCG/ML injection Inject 1,000 mcg into the muscle every 30 (thirty) days.    . divalproex (DEPAKOTE ER) 500 MG 24 hr tablet Take 500 mg by mouth 2 (two) times daily.    . famotidine (PEPCID) 20 MG tablet Take 1 tablet (20 mg total) by mouth at bedtime. 30  tablet 11  . Fluticasone-Salmeterol (ADVAIR) 250-50 MCG/DOSE AEPB Inhale 1 puff into the lungs daily.    . folic acid (FOLVITE) 1 MG tablet Take 1 mg by mouth daily.    Marland Kitchen HYDROcodone-acetaminophen (NORCO/VICODIN) 5-325 MG tablet TK 1 T PO Q 6 H PRF MODERATE PAIN    . levothyroxine (SYNTHROID) 25 MCG tablet     . levothyroxine (SYNTHROID, LEVOTHROID) 50 MCG tablet Take 50 mcg daily before breakfast by mouth.    Marland Kitchen LORazepam (ATIVAN) 1 MG tablet 1  qhs 30 tablet 4  . MELATONIN PO Take 9 mg daily by mouth. 2 tablets at bedtime    . memantine (NAMENDA) 10 MG tablet Take 10 mg by mouth 2 (two) times daily. Reported on 10/06/2015    . metFORMIN (GLUCOPHAGE) 500 MG tablet Take 500 mg by mouth 2 (two) times daily with a meal.    . Multiple Vitamin (MULTIVITAMIN PO) Take 1 tablet by mouth daily.     . ondansetron (ZOFRAN) 4 MG tablet     . oxycodone (OXY-IR) 5 MG capsule Take 5 mg by mouth every 4 (four) hours as needed.    . pantoprazole (PROTONIX) 40 MG tablet Take 1 tablet (40 mg total) by mouth 2 (two) times daily. 60 tablet 11  . QUEtiapine (SEROQUEL) 100 MG tablet Take 1 tablet (100 mg total) by mouth at bedtime. 90 tablet 4  . tamsulosin (FLOMAX) 0.4 MG CAPS capsule Take 0.4 mg by mouth daily.    . traMADol (ULTRAM) 50 MG tablet Take 50 mg by mouth every 6 (six) hours as needed.     No current facility-administered medications for this visit.    Musculoskeletal: Strength & Muscle Tone:  Gait & Station:  Patient leans:   Psychiatric Specialty Exam: ROS  There were no vitals taken for this visit.There is  no height or weight on file to calculate BMI.  General Appearance:   Eye Contact:    Speech:    Volume:    Mood:    Affect:    Thought Process:  Goal Directed  Orientation:  Full (Time, Place, and Person)  Thought Content:     Suicidal Thoughts:  No  Homicidal Thoughts:  No  Memory:  NA  Judgement:  Good  Insight:  Fair  Psychomotor Activity:  Normal  Concentration:    Recall:   Good  Fund of Knowledge:  Language:   Akathisia:    Handed:    AIMS (if indicated):  not done  Assets:  Desire for Improvement  ADL's:  Impaired  Cognition: Impaired,  Mild  Sleep:  Fair   Screenings: PHQ2-9     Patient Outreach from 07/13/2016 in [place]  PHQ-2 Total Score  6  PHQ-9 Total Score  27      Assessment and Plan:   2/17/20213:59 PM   At this time the patient's problem most likely is that of major depression.  He will continue taking Seroquel at our next visit we will talk to him about an antidepressant.  At this time so stable I do not want to introduce anything new.  I would rather have him return in a few months.  So his first problem is likely that of major depression.  His second problem is that of insomnia for now he takes Ativan.  He also takes Seroquel 100 mg which will be as is.  His weight is stable.  He is taking Abilify 2 mg as well as my hope is that we will probably discontinue the Abilify as the Seroquel seems to help him the most.  I will start an antidepressant on his next visit.  He was taking Paxil for a long period of time but I believe it was not beneficial and likely made him oversedated as well.  According to the patient and his caregiver he is actually doing very well.  He is not suicidal.  He has no chest pain or shortness of breath or any neurological complaints.

## 2019-10-06 DIAGNOSIS — G63 Polyneuropathy in diseases classified elsewhere: Secondary | ICD-10-CM | POA: Diagnosis not present

## 2019-10-06 DIAGNOSIS — G219 Secondary parkinsonism, unspecified: Secondary | ICD-10-CM | POA: Diagnosis not present

## 2019-10-06 DIAGNOSIS — F0280 Dementia in other diseases classified elsewhere without behavioral disturbance: Secondary | ICD-10-CM | POA: Diagnosis not present

## 2019-10-06 DIAGNOSIS — G4733 Obstructive sleep apnea (adult) (pediatric): Secondary | ICD-10-CM | POA: Diagnosis not present

## 2019-10-06 DIAGNOSIS — R269 Unspecified abnormalities of gait and mobility: Secondary | ICD-10-CM | POA: Diagnosis not present

## 2019-10-06 DIAGNOSIS — F319 Bipolar disorder, unspecified: Secondary | ICD-10-CM | POA: Diagnosis not present

## 2019-10-06 DIAGNOSIS — G20 Parkinson's disease: Secondary | ICD-10-CM | POA: Diagnosis not present

## 2019-10-06 DIAGNOSIS — R251 Tremor, unspecified: Secondary | ICD-10-CM | POA: Diagnosis not present

## 2019-10-06 DIAGNOSIS — G4700 Insomnia, unspecified: Secondary | ICD-10-CM | POA: Diagnosis not present

## 2019-10-09 DIAGNOSIS — C4442 Squamous cell carcinoma of skin of scalp and neck: Secondary | ICD-10-CM | POA: Diagnosis not present

## 2019-10-31 ENCOUNTER — Other Ambulatory Visit: Payer: Self-pay

## 2019-11-03 ENCOUNTER — Other Ambulatory Visit: Payer: Self-pay

## 2019-11-03 ENCOUNTER — Ambulatory Visit (INDEPENDENT_AMBULATORY_CARE_PROVIDER_SITE_OTHER): Payer: Medicare HMO | Admitting: Podiatry

## 2019-11-03 DIAGNOSIS — L84 Corns and callosities: Secondary | ICD-10-CM | POA: Diagnosis not present

## 2019-11-03 DIAGNOSIS — B351 Tinea unguium: Secondary | ICD-10-CM | POA: Diagnosis not present

## 2019-11-03 DIAGNOSIS — D689 Coagulation defect, unspecified: Secondary | ICD-10-CM | POA: Diagnosis not present

## 2019-11-03 NOTE — Progress Notes (Signed)
  Subjective:  Patient ID: [name], male    DOB: 18-Aug-1940,  MRN: [id number]  Chief Complaint  Patient presents with  . debride    diabetic nail trimming  . Diabetes    FBS: 140 x 2 days A1C: 5 PCP: [facility] x 2 mo -pt still on Plavix   79 y.o. male presents with the above complaint. History confirmed with patient.   Objective:  Physical Exam: warm, good capillary refill, nail exam onychomycosis of the toenails, no trophic changes or ulcerative lesions. DP pulses palpable and protective sensation intact Left Foot: normal exam, no swelling, tenderness, instability; ligaments intact, full range of motion of all ankle/foot joints  Right Foot: normal exam, no swelling, tenderness, instability; ligaments intact, full range of motion of all ankle/foot joints Pre-ulcerative callus plantar hallux  No images are attached to the encounter.  Assessment:   1. Onychomycosis   2. Coagulation defect ([place])   3. Callus      Plan:  Patient was evaluated and treated and all questions answered.  Onychomycosis and Coagulation Defect -Patient is diabetic with a qualifying condition for at risk foot care.  Procedure: Nail Debridement Rationale: Patient meets criteria for routine foot care due to coag defect Type of Debridement: manual, sharp debridement. Instrumentation: Nail nipper, rotary burr. Number of Nails: 10  R Hallux Pre-ulcer -Lesion pared  Procedure: Paring of Lesion Rationale: painful hyperkeratotic lesion Type of Debridement: manual, sharp debridement. Instrumentation: 312 blade Number of Lesions: 1  Return in about 4 months (around 03/04/2020) for Diabetic Foot Care.

## 2019-11-30 DIAGNOSIS — G4733 Obstructive sleep apnea (adult) (pediatric): Secondary | ICD-10-CM | POA: Diagnosis not present

## 2019-12-11 DIAGNOSIS — E039 Hypothyroidism, unspecified: Secondary | ICD-10-CM | POA: Diagnosis not present

## 2019-12-11 DIAGNOSIS — G3109 Other frontotemporal dementia: Secondary | ICD-10-CM | POA: Diagnosis not present

## 2019-12-11 DIAGNOSIS — E1129 Type 2 diabetes mellitus with other diabetic kidney complication: Secondary | ICD-10-CM | POA: Diagnosis not present

## 2019-12-11 DIAGNOSIS — G4733 Obstructive sleep apnea (adult) (pediatric): Secondary | ICD-10-CM | POA: Diagnosis not present

## 2019-12-11 DIAGNOSIS — J45909 Unspecified asthma, uncomplicated: Secondary | ICD-10-CM | POA: Diagnosis not present

## 2019-12-11 DIAGNOSIS — E785 Hyperlipidemia, unspecified: Secondary | ICD-10-CM | POA: Diagnosis not present

## 2019-12-11 DIAGNOSIS — G20 Parkinson's disease: Secondary | ICD-10-CM | POA: Diagnosis not present

## 2019-12-11 DIAGNOSIS — M545 Low back pain: Secondary | ICD-10-CM | POA: Diagnosis not present

## 2019-12-11 DIAGNOSIS — I10 Essential (primary) hypertension: Secondary | ICD-10-CM | POA: Diagnosis not present

## 2019-12-15 DIAGNOSIS — G4733 Obstructive sleep apnea (adult) (pediatric): Secondary | ICD-10-CM | POA: Diagnosis not present

## 2019-12-31 ENCOUNTER — Telehealth (INDEPENDENT_AMBULATORY_CARE_PROVIDER_SITE_OTHER): Payer: Medicare HMO | Admitting: Psychiatry

## 2019-12-31 ENCOUNTER — Other Ambulatory Visit: Payer: Self-pay

## 2019-12-31 DIAGNOSIS — F3130 Bipolar disorder, current episode depressed, mild or moderate severity, unspecified: Secondary | ICD-10-CM

## 2019-12-31 MED ORDER — LORAZEPAM 1 MG PO TABS: ORAL_TABLET | ORAL | 5 refills | Status: DC

## 2019-12-31 MED ORDER — RAMELTEON 8 MG PO TABS
8.0000 mg | ORAL_TABLET | Freq: Every day | ORAL | 3 refills | Status: DC
Start: 2019-12-31 — End: 2022-01-03

## 2019-12-31 MED ORDER — QUETIAPINE FUMARATE 100 MG PO TABS: 100.0000 mg | ORAL_TABLET | Freq: Every day | ORAL | 4 refills | Status: AC

## 2019-12-31 NOTE — Progress Notes (Signed)
Psychiatric Initial Adult Assessment   Patient Identification: [name] MRN:  [id number] Date of Evaluation:  12/31/2019 Referral Source: Dr Brigitte Pulse Chief Complaint:   Visit Diagnosis: Bipolar Depressed  History of Present Illness:    Today the patient is doing fairly well.  He has a lot of physical limitations.  He is having trouble ambulating.  His caregiver is [name] who says he is at his baseline.  She says emotionally and mentally he is actually pretty good.  He is lucid and clear with me.  He is appropriate.  He denies being depressed.  He stays active watching TV but does little else.  The patient has been diagnosed with having some pseudoparkinsonian is probably from his antipsychotic medicines.  At this time he is not on an antidepressant.  He still enjoying things and is getting along fairly well.  He does his episodes where he gets confused but that is rare.  He is not really agitated.  He has no evidence of psychosis.  The patient is functioning fairly well but needs lots of assistance.  His ability to do his basic ADLs is lessening.  He has times where he stools will himself.  The patient has a stable environment that he is living in. Depression Symptoms:  depressed mood, insomnia,(Hypoymptoms:   Anxiety Symptoms:   Psychotic Symptoms:   PTSD Symptoms:   Past Psychiatric History:  Multiple psychiatric medicines   Previous Psychotropic Medications: Yes   Substance Abuse History in the last 12 months:    Consequences of Substance Abuse:   Past Medical History:  Past Medical History:  Diagnosis Date  . Anxiety    Xanax as needed  . Arthritis    spinal stenosis.  . Asthma   . Barrett esophagus   . Bipolar disorder ([place])   . BPH (benign prostatic hyperplasia)   . Cataract   . Chronic kidney disease    since NSAID's stopped -function has improved  . COPD (chronic obstructive pulmonary disease) ([place])   . Crohn's colitis ([place])   . Depression    takes Cymbalta  daily  . Diverticulosis 01/2007  . DM (diabetes mellitus) ([place])    takes Januvia now, metformin stopped due to diarrhea  . ED (erectile dysfunction)   . GERD (gastroesophageal reflux disease)    uses Tums  . Glaucoma    surgery to correct  . History of colon polyps   . HLD (hyperlipidemia)    takes Lipitor daily  . HTN (hypertension)    takes Altace and Diovan daily  . Impaired hearing    "getting this checked out on Monday"  . Insomnia   . Internal hemorrhoids 01/2007  . Joint pain   . Joint swelling   . Microscopic colitis   . Nocturia   . Obesity   . Osteoarthritis   . Pneumonia 3/09  . Sleep apnea    uses CPAP;has been greater than 57yrs since sleep study- Dr. Elsworth Soho    Past Surgical History:  Procedure Laterality Date  . CATARACT EXTRACTION W/ INTRAOCULAR LENS  IMPLANT, BILATERAL  2011  . CHOLECYSTECTOMY  2011  . COLONOSCOPY  01/01/2014   Mild patchy colitis of questionable importance. Moderate predominantly sigmoid diverticulosis. Small internal hemorrhoids. Limited examination due to quality of preparation.    . COLONOSCOPY W/ BIOPSIES  2008   Looks normal, biopsy suggested a chronic microscopic colitis with rare granulomas  . COLONOSCOPY W/ BIOPSIES  02/2011   diverticulosis, internal hemorrhoids, patchy chronic colitis on random  bxs,   . ESOPHAGOGASTRODUODENOSCOPY  07/18/2017   Barretts esophagus. Small hiatal hernia.   Marland Kitchen EYE SURGERY     for glaucoma  . HERNIA REPAIR    . LUMBAR LAMINECTOMY/DECOMPRESSION MICRODISCECTOMY N/A 08/04/2015   Procedure: CENTRAL DECOMPRESSION  LUMBAR LAMINECTOMY L4-L5 ;  Surgeon: Latanya Maudlin, MD;  Location: [facility];  Service: Orthopedics;  Laterality: N/A;  . NOSE SURGERY  2011  . PENILE PROSTHESIS IMPLANT  2012  . ROTATOR CUFF REPAIR Bilateral    '12 -left, '13 -right  . SHOULDER ARTHROSCOPY  08/24/2011   Procedure: ARTHROSCOPY SHOULDER;  Surgeon: Metta Clines Supple;  Location: [facility];  Service: Orthopedics;  Laterality: Right;  RIGHT  SHOULDER ARTHROSCOPY WITH SUBACROMINAL DECOMPRESSION AND DISTAL CLAVICAL RESECTION   . sleep apnea surgery  1997  . TONSILLECTOMY     as a child  . UMBILICAL HERNIA REPAIR  5+yrs ago    Family Psychiatric History:   Family History:  Family History  Problem Relation Age of Onset  . Esophageal cancer Paternal Grandmother   . Heart failure Mother   . Depression Mother   . Alzheimer's disease Brother   . Heart attack Father   . Pneumonia Father   . Colon cancer Neg Hx     Social History:   Social History   Socioeconomic History  . Marital status: Widowed    Spouse name: Not on file  . Number of children: Not on file  . Years of education: Not on file  . Highest education level: Not on file  Occupational History  . Occupation: retired    Comment: Paramedic  Tobacco Use  . Smoking status: Never Smoker  . Smokeless tobacco: Never Used  Substance and Sexual Activity  . Alcohol use: No  . Drug use: No  . Sexual activity: Not Currently  Other Topics Concern  . 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 Difficulty of Paying Living Expenses:   Food Insecurity:   . Worried About Charity fundraiser in the Last Year:   . Arboriculturist in the Last Year:   Transportation Needs:   . Film/video editor (Medical):   Marland Kitchen Lack of Transportation (Non-Medical):   Physical Activity:   . Days of Exercise per Week:   . Minutes of Exercise per Session:   Stress:   . Feeling of Stress :   Social Connections:   . Frequency of Communication with Friends and Family:   . Frequency of Social Gatherings with Friends and Family:   . Attends Religious Services:   . Active Member of Clubs or Organizations:   . Attends Archivist Meetings:   Marland Kitchen Marital Status:     Additional Social History:    Allergies:    Allergies  Allergen Reactions  . Shellfish Allergy Anaphylaxis    Patient informed RN no problem with Betadine 08/04/2015   . Latex Rash    Reports facial rash from Cpap mask    Metabolic Disorder Labs: Lab Results  Component Value Date   HGBA1C 9.9 (H) 08/02/2015   MPG 237 08/02/2015   No results found for: PROLACTIN No results found for: CHOL, TRIG, HDL, CHOLHDL, VLDL, LDLCALC Lab Results  Component Value Date   TSH 0.466 08/05/2015    Therapeutic Level Labs: No results found for: LITHIUM  No results found for: CBMZ Lab Results  Component Value Date   VALPROATE 35 (L) 08/05/2015    Current Medications: Current Outpatient Medications  Medication Sig Dispense Refill  . acetaminophen (TYLENOL) 325 MG tablet Take 650 mg by mouth daily.    Marland Kitchen albuterol (PROAIR HFA) 108 (90 BASE) MCG/ACT inhaler Inhale 2 puffs into the lungs every 4 (four) hours as needed for wheezing or shortness of breath.     Marland Kitchen amLODipine (NORVASC) 10 MG tablet Take 10 mg by mouth daily.    Marland Kitchen atorvastatin (LIPITOR) 10 MG tablet Take 10 mg by mouth every other day.     . bismuth subsalicylate (PEPTO BISMOL) 262 MG/15ML suspension Take 30 mLs by mouth 2 (two) times daily as needed for indigestion.     . Calcium Carbonate Antacid (TUMS PO) Take 4 tablets by mouth 4 (four) times daily as needed (heartburn). Reported on 10/06/2015    . carbidopa-levodopa (SINEMET IR) 10-100 MG tablet Take 1 tablet 3 (three) times daily by mouth.    . clopidogrel (PLAVIX) 75 MG tablet Take 75 mg by mouth daily.    . cyanocobalamin (,VITAMIN B-12,) 1000 MCG/ML injection Inject 1,000 mcg into the muscle every 30 (thirty) days.    . divalproex (DEPAKOTE ER) 500 MG 24 hr tablet Take 500 mg by mouth 2 (two) times daily.    . famotidine (PEPCID) 20 MG tablet Take 1 tablet (20 mg total) by mouth at bedtime. 30 tablet 11  . Fluticasone-Salmeterol (ADVAIR) 250-50 MCG/DOSE AEPB Inhale 1 puff into the lungs daily.    . folic acid (FOLVITE) 1  MG tablet Take 1 mg by mouth daily.    Marland Kitchen HYDROcodone-acetaminophen (NORCO/VICODIN) 5-325 MG tablet TK 1 T PO Q 6 H PRF MODERATE PAIN    . ketoconazole (NIZORAL) 2 % shampoo     . levofloxacin (LEVAQUIN) 500 MG tablet     . levothyroxine (SYNTHROID) 25 MCG tablet     . levothyroxine (SYNTHROID, LEVOTHROID) 50 MCG tablet Take 50 mcg daily before breakfast by mouth.    Marland Kitchen LORazepam (ATIVAN) 1 MG tablet 1  qhs 30 tablet 5  . Melatonin 5 MG TABS Take by mouth.    . MELATONIN PO Take 9 mg daily by mouth. 2 tablets at bedtime    . memantine (NAMENDA) 10 MG tablet Take 10 mg by mouth 2 (two) times daily. Reported on 10/06/2015    . metFORMIN (GLUCOPHAGE) 500 MG tablet Take 500 mg by mouth 2 (two) times daily with a meal.    . Multiple Vitamin (MULTIVITAMIN PO) Take 1 tablet by mouth daily.     . ondansetron (ZOFRAN) 4 MG tablet     . oxycodone (OXY-IR) 5 MG capsule Take 5 mg by mouth every 4 (four) hours as needed.    . pantoprazole (PROTONIX) 40 MG tablet Take 1 tablet (40 mg total) by mouth 2 (two) times daily. 60 tablet 11  . QUEtiapine (SEROQUEL) 100 MG tablet Take 1 tablet (100 mg total) by mouth at bedtime. 90 tablet 4  . ramelteon (ROZEREM) 8 MG tablet Take 1 tablet (8 mg total) by mouth at bedtime. 30 tablet 3  . tamsulosin (FLOMAX) 0.4 MG CAPS capsule Take 0.4 mg by mouth daily.    . traMADol (ULTRAM) 50 MG tablet Take 50 mg by mouth every 6 (six) hours as needed.    . traZODone (DESYREL) 50 MG tablet Take by mouth.     No current facility-administered medications for this visit.  Musculoskeletal: Strength & Muscle Tone:  Gait & Station:  Patient leans:   Psychiatric Specialty Exam: ROS  There were no vitals taken for this visit.There is no height or weight on file to calculate BMI.  General Appearance:   Eye Contact:    Speech:    Volume:    Mood:    Affect:    Thought Process:  Goal Directed  Orientation:  Full (Time, Place, and Person)  Thought Content:     Suicidal  Thoughts:  No  Homicidal Thoughts:  No  Memory:  NA  Judgement:  Good  Insight:  Fair  Psychomotor Activity:  Normal  Concentration:    Recall:  Good  Fund of Knowledge:  Language:   Akathisia:    Handed:    AIMS (if indicated):  not done  Assets:  Desire for Improvement  ADL's:  Impaired  Cognition: Impaired,  Mild  Sleep:  Fair   Screenings: PHQ2-9     Patient Outreach from 07/13/2016 in [place]  PHQ-2 Total Score  6  PHQ-9 Total Score  27      Assessment and Plan:   5/19/20214:20 PM   At this time most likely this patient's major problem is major depression.  Today we will discontinue his low-dose Abilify continue 100 mg of Seroquel.  His second problem is that of insomnia.  The 1 mg of Ativan is not quite adequate.  I am hesitant to increase his Ativan given that he is unstable on his feet.  We will go ahead and add Rozerem for sleep.  Therefore he will take Ativan and Rozerem for sleep.  At this time we will not be starting an antidepressant.  Emotionally he actually seems quite stable.  The patient return to see me in 4 months.  At that time we will reevaluate his pseudoparkinsonian symptoms.  I do not believe this is tardive dyskinesia.

## 2020-01-05 ENCOUNTER — Other Ambulatory Visit (HOSPITAL_COMMUNITY): Payer: Self-pay | Admitting: Psychiatry

## 2020-01-09 DIAGNOSIS — R5381 Other malaise: Secondary | ICD-10-CM | POA: Diagnosis not present

## 2020-01-09 DIAGNOSIS — E1122 Type 2 diabetes mellitus with diabetic chronic kidney disease: Secondary | ICD-10-CM | POA: Diagnosis not present

## 2020-01-09 DIAGNOSIS — Z8669 Personal history of other diseases of the nervous system and sense organs: Secondary | ICD-10-CM | POA: Diagnosis not present

## 2020-01-09 DIAGNOSIS — R109 Unspecified abdominal pain: Secondary | ICD-10-CM | POA: Diagnosis not present

## 2020-01-09 DIAGNOSIS — R4182 Altered mental status, unspecified: Secondary | ICD-10-CM | POA: Diagnosis not present

## 2020-01-09 DIAGNOSIS — E119 Type 2 diabetes mellitus without complications: Secondary | ICD-10-CM

## 2020-01-09 DIAGNOSIS — R531 Weakness: Secondary | ICD-10-CM | POA: Diagnosis not present

## 2020-01-09 DIAGNOSIS — G20 Parkinson's disease: Secondary | ICD-10-CM | POA: Diagnosis not present

## 2020-01-09 DIAGNOSIS — N184 Chronic kidney disease, stage 4 (severe): Secondary | ICD-10-CM | POA: Diagnosis not present

## 2020-01-09 DIAGNOSIS — W19XXXA Unspecified fall, initial encounter: Secondary | ICD-10-CM | POA: Diagnosis not present

## 2020-01-09 DIAGNOSIS — R918 Other nonspecific abnormal finding of lung field: Secondary | ICD-10-CM | POA: Diagnosis not present

## 2020-01-09 DIAGNOSIS — J181 Lobar pneumonia, unspecified organism: Secondary | ICD-10-CM | POA: Diagnosis not present

## 2020-01-09 DIAGNOSIS — N179 Acute kidney failure, unspecified: Secondary | ICD-10-CM | POA: Diagnosis not present

## 2020-01-09 DIAGNOSIS — J189 Pneumonia, unspecified organism: Secondary | ICD-10-CM

## 2020-01-09 DIAGNOSIS — Z7401 Bed confinement status: Secondary | ICD-10-CM | POA: Diagnosis not present

## 2020-01-09 DIAGNOSIS — M2550 Pain in unspecified joint: Secondary | ICD-10-CM | POA: Diagnosis not present

## 2020-01-09 DIAGNOSIS — M545 Low back pain: Secondary | ICD-10-CM | POA: Diagnosis not present

## 2020-01-09 DIAGNOSIS — N3000 Acute cystitis without hematuria: Secondary | ICD-10-CM | POA: Diagnosis not present

## 2020-01-09 DIAGNOSIS — R410 Disorientation, unspecified: Secondary | ICD-10-CM | POA: Diagnosis not present

## 2020-01-09 DIAGNOSIS — A419 Sepsis, unspecified organism: Secondary | ICD-10-CM

## 2020-01-09 DIAGNOSIS — Z8673 Personal history of transient ischemic attack (TIA), and cerebral infarction without residual deficits: Secondary | ICD-10-CM | POA: Diagnosis not present

## 2020-01-09 DIAGNOSIS — B9620 Unspecified Escherichia coli [E. coli] as the cause of diseases classified elsewhere: Secondary | ICD-10-CM | POA: Diagnosis not present

## 2020-01-09 DIAGNOSIS — G40909 Epilepsy, unspecified, not intractable, without status epilepticus: Secondary | ICD-10-CM | POA: Diagnosis not present

## 2020-01-09 DIAGNOSIS — F0390 Unspecified dementia without behavioral disturbance: Secondary | ICD-10-CM | POA: Diagnosis not present

## 2020-01-09 DIAGNOSIS — D72829 Elevated white blood cell count, unspecified: Secondary | ICD-10-CM | POA: Diagnosis not present

## 2020-01-09 DIAGNOSIS — N390 Urinary tract infection, site not specified: Secondary | ICD-10-CM | POA: Diagnosis not present

## 2020-01-09 DIAGNOSIS — I10 Essential (primary) hypertension: Secondary | ICD-10-CM | POA: Diagnosis not present

## 2020-01-20 ENCOUNTER — Telehealth (HOSPITAL_COMMUNITY): Payer: Self-pay | Admitting: *Deleted

## 2020-01-20 NOTE — Telephone Encounter (Signed)
Writer received a t/c from pt pharmacy, [facility], stating that pt cannot afford the Rozeram as co-pay is $300. [facility] (insurance) Pharmacy recommending either Trazodone or Remeron which both have no copay. Please review and advise.

## 2020-01-22 DIAGNOSIS — J181 Lobar pneumonia, unspecified organism: Secondary | ICD-10-CM | POA: Diagnosis not present

## 2020-01-22 DIAGNOSIS — R262 Difficulty in walking, not elsewhere classified: Secondary | ICD-10-CM | POA: Diagnosis not present

## 2020-01-22 DIAGNOSIS — Z8673 Personal history of transient ischemic attack (TIA), and cerebral infarction without residual deficits: Secondary | ICD-10-CM | POA: Diagnosis not present

## 2020-01-22 DIAGNOSIS — R531 Weakness: Secondary | ICD-10-CM | POA: Diagnosis not present

## 2020-01-22 DIAGNOSIS — R4182 Altered mental status, unspecified: Secondary | ICD-10-CM | POA: Diagnosis not present

## 2020-01-22 DIAGNOSIS — M2550 Pain in unspecified joint: Secondary | ICD-10-CM | POA: Diagnosis not present

## 2020-01-22 DIAGNOSIS — Z8669 Personal history of other diseases of the nervous system and sense organs: Secondary | ICD-10-CM | POA: Diagnosis not present

## 2020-01-22 DIAGNOSIS — Z7401 Bed confinement status: Secondary | ICD-10-CM | POA: Diagnosis not present

## 2020-01-22 DIAGNOSIS — J189 Pneumonia, unspecified organism: Secondary | ICD-10-CM | POA: Diagnosis not present

## 2020-01-22 DIAGNOSIS — D649 Anemia, unspecified: Secondary | ICD-10-CM | POA: Diagnosis not present

## 2020-01-22 DIAGNOSIS — E119 Type 2 diabetes mellitus without complications: Secondary | ICD-10-CM | POA: Diagnosis not present

## 2020-01-22 DIAGNOSIS — I10 Essential (primary) hypertension: Secondary | ICD-10-CM | POA: Diagnosis not present

## 2020-01-22 DIAGNOSIS — I119 Hypertensive heart disease without heart failure: Secondary | ICD-10-CM | POA: Diagnosis not present

## 2020-01-22 DIAGNOSIS — F0390 Unspecified dementia without behavioral disturbance: Secondary | ICD-10-CM | POA: Diagnosis not present

## 2020-01-22 DIAGNOSIS — A419 Sepsis, unspecified organism: Secondary | ICD-10-CM | POA: Diagnosis not present

## 2020-01-22 DIAGNOSIS — Z79899 Other long term (current) drug therapy: Secondary | ICD-10-CM | POA: Diagnosis not present

## 2020-01-22 DIAGNOSIS — N390 Urinary tract infection, site not specified: Secondary | ICD-10-CM | POA: Diagnosis not present

## 2020-01-22 DIAGNOSIS — M545 Low back pain: Secondary | ICD-10-CM | POA: Diagnosis not present

## 2020-01-23 DIAGNOSIS — F0390 Unspecified dementia without behavioral disturbance: Secondary | ICD-10-CM | POA: Diagnosis not present

## 2020-01-23 DIAGNOSIS — R262 Difficulty in walking, not elsewhere classified: Secondary | ICD-10-CM | POA: Diagnosis not present

## 2020-01-23 DIAGNOSIS — J189 Pneumonia, unspecified organism: Secondary | ICD-10-CM | POA: Diagnosis not present

## 2020-01-23 DIAGNOSIS — I119 Hypertensive heart disease without heart failure: Secondary | ICD-10-CM | POA: Diagnosis not present

## 2020-01-28 ENCOUNTER — Other Ambulatory Visit (HOSPITAL_COMMUNITY): Payer: Self-pay | Admitting: *Deleted

## 2020-01-28 MED ORDER — HYDROXYZINE HCL 25 MG PO TABS: 25.0000 mg | ORAL_TABLET | Freq: Every day | ORAL | 3 refills | Status: DC

## 2020-02-09 ENCOUNTER — Ambulatory Visit: Payer: Medicare HMO | Admitting: Podiatry

## 2020-02-10 ENCOUNTER — Other Ambulatory Visit: Payer: Self-pay | Admitting: *Deleted

## 2020-02-10 NOTE — Patient Outreach (Signed)
[facility]) Care Management  02/10/2020  [name] 03-23-1941 [id number]   Transition of care referral   Referral Date : 02/10/20 Referral source: [facility] Notification of discharge Date of Discharge: 02/09/20 Facility: [place] : [facility]   Referral received. Transition of care calls being completed via EMMI-automated calls. RN CM will outreach patient for any red flags received.   Case Closure Reason: enrolled in another program.   Joylene Draft, RN, BSN  [place] Management Coordinator  [phone]- Mobile [phone]- [place]

## 2020-02-19 DIAGNOSIS — N401 Enlarged prostate with lower urinary tract symptoms: Secondary | ICD-10-CM | POA: Diagnosis not present

## 2020-02-19 DIAGNOSIS — G9341 Metabolic encephalopathy: Secondary | ICD-10-CM | POA: Diagnosis not present

## 2020-02-19 DIAGNOSIS — G40909 Epilepsy, unspecified, not intractable, without status epilepticus: Secondary | ICD-10-CM | POA: Diagnosis not present

## 2020-02-19 DIAGNOSIS — N390 Urinary tract infection, site not specified: Secondary | ICD-10-CM | POA: Diagnosis not present

## 2020-02-19 DIAGNOSIS — G20 Parkinson's disease: Secondary | ICD-10-CM | POA: Diagnosis not present

## 2020-02-19 DIAGNOSIS — E1122 Type 2 diabetes mellitus with diabetic chronic kidney disease: Secondary | ICD-10-CM | POA: Diagnosis not present

## 2020-02-19 DIAGNOSIS — R338 Other retention of urine: Secondary | ICD-10-CM | POA: Diagnosis not present

## 2020-02-19 DIAGNOSIS — N189 Chronic kidney disease, unspecified: Secondary | ICD-10-CM | POA: Diagnosis not present

## 2020-02-19 DIAGNOSIS — N400 Enlarged prostate without lower urinary tract symptoms: Secondary | ICD-10-CM | POA: Diagnosis not present

## 2020-02-19 DIAGNOSIS — E1151 Type 2 diabetes mellitus with diabetic peripheral angiopathy without gangrene: Secondary | ICD-10-CM | POA: Diagnosis not present

## 2020-02-19 DIAGNOSIS — F0281 Dementia in other diseases classified elsewhere with behavioral disturbance: Secondary | ICD-10-CM | POA: Diagnosis not present

## 2020-02-19 DIAGNOSIS — I129 Hypertensive chronic kidney disease with stage 1 through stage 4 chronic kidney disease, or unspecified chronic kidney disease: Secondary | ICD-10-CM | POA: Diagnosis not present

## 2020-02-19 DIAGNOSIS — F319 Bipolar disorder, unspecified: Secondary | ICD-10-CM | POA: Diagnosis not present

## 2020-02-19 DIAGNOSIS — E039 Hypothyroidism, unspecified: Secondary | ICD-10-CM | POA: Diagnosis not present

## 2020-02-20 DIAGNOSIS — N401 Enlarged prostate with lower urinary tract symptoms: Secondary | ICD-10-CM | POA: Diagnosis not present

## 2020-02-20 DIAGNOSIS — N390 Urinary tract infection, site not specified: Secondary | ICD-10-CM | POA: Diagnosis not present

## 2020-02-20 DIAGNOSIS — R32 Unspecified urinary incontinence: Secondary | ICD-10-CM | POA: Diagnosis not present

## 2020-02-20 DIAGNOSIS — F0390 Unspecified dementia without behavioral disturbance: Secondary | ICD-10-CM | POA: Diagnosis not present

## 2020-02-20 DIAGNOSIS — Z79899 Other long term (current) drug therapy: Secondary | ICD-10-CM | POA: Diagnosis not present

## 2020-02-23 DIAGNOSIS — E1122 Type 2 diabetes mellitus with diabetic chronic kidney disease: Secondary | ICD-10-CM | POA: Diagnosis not present

## 2020-02-23 DIAGNOSIS — N390 Urinary tract infection, site not specified: Secondary | ICD-10-CM | POA: Diagnosis not present

## 2020-02-23 DIAGNOSIS — G40909 Epilepsy, unspecified, not intractable, without status epilepticus: Secondary | ICD-10-CM | POA: Diagnosis not present

## 2020-02-23 DIAGNOSIS — F319 Bipolar disorder, unspecified: Secondary | ICD-10-CM | POA: Diagnosis not present

## 2020-02-23 DIAGNOSIS — E039 Hypothyroidism, unspecified: Secondary | ICD-10-CM | POA: Diagnosis not present

## 2020-02-23 DIAGNOSIS — N189 Chronic kidney disease, unspecified: Secondary | ICD-10-CM | POA: Diagnosis not present

## 2020-02-23 DIAGNOSIS — N400 Enlarged prostate without lower urinary tract symptoms: Secondary | ICD-10-CM | POA: Diagnosis not present

## 2020-02-23 DIAGNOSIS — E1151 Type 2 diabetes mellitus with diabetic peripheral angiopathy without gangrene: Secondary | ICD-10-CM | POA: Diagnosis not present

## 2020-02-23 DIAGNOSIS — I129 Hypertensive chronic kidney disease with stage 1 through stage 4 chronic kidney disease, or unspecified chronic kidney disease: Secondary | ICD-10-CM | POA: Diagnosis not present

## 2020-02-25 DIAGNOSIS — N390 Urinary tract infection, site not specified: Secondary | ICD-10-CM | POA: Diagnosis not present

## 2020-02-25 DIAGNOSIS — F319 Bipolar disorder, unspecified: Secondary | ICD-10-CM | POA: Diagnosis not present

## 2020-02-25 DIAGNOSIS — E039 Hypothyroidism, unspecified: Secondary | ICD-10-CM | POA: Diagnosis not present

## 2020-02-25 DIAGNOSIS — E1151 Type 2 diabetes mellitus with diabetic peripheral angiopathy without gangrene: Secondary | ICD-10-CM | POA: Diagnosis not present

## 2020-02-25 DIAGNOSIS — I129 Hypertensive chronic kidney disease with stage 1 through stage 4 chronic kidney disease, or unspecified chronic kidney disease: Secondary | ICD-10-CM | POA: Diagnosis not present

## 2020-02-25 DIAGNOSIS — E1122 Type 2 diabetes mellitus with diabetic chronic kidney disease: Secondary | ICD-10-CM | POA: Diagnosis not present

## 2020-02-25 DIAGNOSIS — N400 Enlarged prostate without lower urinary tract symptoms: Secondary | ICD-10-CM | POA: Diagnosis not present

## 2020-02-25 DIAGNOSIS — N189 Chronic kidney disease, unspecified: Secondary | ICD-10-CM | POA: Diagnosis not present

## 2020-02-25 DIAGNOSIS — G40909 Epilepsy, unspecified, not intractable, without status epilepticus: Secondary | ICD-10-CM | POA: Diagnosis not present

## 2020-02-26 DIAGNOSIS — E039 Hypothyroidism, unspecified: Secondary | ICD-10-CM | POA: Diagnosis not present

## 2020-02-26 DIAGNOSIS — G40909 Epilepsy, unspecified, not intractable, without status epilepticus: Secondary | ICD-10-CM | POA: Diagnosis not present

## 2020-02-26 DIAGNOSIS — E1122 Type 2 diabetes mellitus with diabetic chronic kidney disease: Secondary | ICD-10-CM | POA: Diagnosis not present

## 2020-02-26 DIAGNOSIS — F319 Bipolar disorder, unspecified: Secondary | ICD-10-CM | POA: Diagnosis not present

## 2020-02-26 DIAGNOSIS — E1151 Type 2 diabetes mellitus with diabetic peripheral angiopathy without gangrene: Secondary | ICD-10-CM | POA: Diagnosis not present

## 2020-02-26 DIAGNOSIS — I129 Hypertensive chronic kidney disease with stage 1 through stage 4 chronic kidney disease, or unspecified chronic kidney disease: Secondary | ICD-10-CM | POA: Diagnosis not present

## 2020-02-26 DIAGNOSIS — N400 Enlarged prostate without lower urinary tract symptoms: Secondary | ICD-10-CM | POA: Diagnosis not present

## 2020-02-26 DIAGNOSIS — N390 Urinary tract infection, site not specified: Secondary | ICD-10-CM | POA: Diagnosis not present

## 2020-02-26 DIAGNOSIS — N189 Chronic kidney disease, unspecified: Secondary | ICD-10-CM | POA: Diagnosis not present

## 2020-02-27 DIAGNOSIS — E1151 Type 2 diabetes mellitus with diabetic peripheral angiopathy without gangrene: Secondary | ICD-10-CM | POA: Diagnosis not present

## 2020-02-27 DIAGNOSIS — N400 Enlarged prostate without lower urinary tract symptoms: Secondary | ICD-10-CM | POA: Diagnosis not present

## 2020-02-27 DIAGNOSIS — I129 Hypertensive chronic kidney disease with stage 1 through stage 4 chronic kidney disease, or unspecified chronic kidney disease: Secondary | ICD-10-CM | POA: Diagnosis not present

## 2020-02-27 DIAGNOSIS — E039 Hypothyroidism, unspecified: Secondary | ICD-10-CM | POA: Diagnosis not present

## 2020-02-27 DIAGNOSIS — N189 Chronic kidney disease, unspecified: Secondary | ICD-10-CM | POA: Diagnosis not present

## 2020-02-27 DIAGNOSIS — G40909 Epilepsy, unspecified, not intractable, without status epilepticus: Secondary | ICD-10-CM | POA: Diagnosis not present

## 2020-02-27 DIAGNOSIS — N390 Urinary tract infection, site not specified: Secondary | ICD-10-CM | POA: Diagnosis not present

## 2020-02-27 DIAGNOSIS — F319 Bipolar disorder, unspecified: Secondary | ICD-10-CM | POA: Diagnosis not present

## 2020-02-27 DIAGNOSIS — E1122 Type 2 diabetes mellitus with diabetic chronic kidney disease: Secondary | ICD-10-CM | POA: Diagnosis not present

## 2020-03-03 DIAGNOSIS — G40909 Epilepsy, unspecified, not intractable, without status epilepticus: Secondary | ICD-10-CM | POA: Diagnosis not present

## 2020-03-03 DIAGNOSIS — F319 Bipolar disorder, unspecified: Secondary | ICD-10-CM | POA: Diagnosis not present

## 2020-03-03 DIAGNOSIS — N189 Chronic kidney disease, unspecified: Secondary | ICD-10-CM | POA: Diagnosis not present

## 2020-03-03 DIAGNOSIS — E1122 Type 2 diabetes mellitus with diabetic chronic kidney disease: Secondary | ICD-10-CM | POA: Diagnosis not present

## 2020-03-03 DIAGNOSIS — N390 Urinary tract infection, site not specified: Secondary | ICD-10-CM | POA: Diagnosis not present

## 2020-03-03 DIAGNOSIS — I129 Hypertensive chronic kidney disease with stage 1 through stage 4 chronic kidney disease, or unspecified chronic kidney disease: Secondary | ICD-10-CM | POA: Diagnosis not present

## 2020-03-03 DIAGNOSIS — N400 Enlarged prostate without lower urinary tract symptoms: Secondary | ICD-10-CM | POA: Diagnosis not present

## 2020-03-03 DIAGNOSIS — E039 Hypothyroidism, unspecified: Secondary | ICD-10-CM | POA: Diagnosis not present

## 2020-03-03 DIAGNOSIS — E1151 Type 2 diabetes mellitus with diabetic peripheral angiopathy without gangrene: Secondary | ICD-10-CM | POA: Diagnosis not present

## 2020-03-09 DIAGNOSIS — E039 Hypothyroidism, unspecified: Secondary | ICD-10-CM | POA: Diagnosis not present

## 2020-03-09 DIAGNOSIS — I129 Hypertensive chronic kidney disease with stage 1 through stage 4 chronic kidney disease, or unspecified chronic kidney disease: Secondary | ICD-10-CM | POA: Diagnosis not present

## 2020-03-09 DIAGNOSIS — N189 Chronic kidney disease, unspecified: Secondary | ICD-10-CM | POA: Diagnosis not present

## 2020-03-09 DIAGNOSIS — N390 Urinary tract infection, site not specified: Secondary | ICD-10-CM | POA: Diagnosis not present

## 2020-03-09 DIAGNOSIS — G40909 Epilepsy, unspecified, not intractable, without status epilepticus: Secondary | ICD-10-CM | POA: Diagnosis not present

## 2020-03-09 DIAGNOSIS — E1151 Type 2 diabetes mellitus with diabetic peripheral angiopathy without gangrene: Secondary | ICD-10-CM | POA: Diagnosis not present

## 2020-03-09 DIAGNOSIS — E1122 Type 2 diabetes mellitus with diabetic chronic kidney disease: Secondary | ICD-10-CM | POA: Diagnosis not present

## 2020-03-09 DIAGNOSIS — F319 Bipolar disorder, unspecified: Secondary | ICD-10-CM | POA: Diagnosis not present

## 2020-03-09 DIAGNOSIS — N400 Enlarged prostate without lower urinary tract symptoms: Secondary | ICD-10-CM | POA: Diagnosis not present

## 2020-03-10 DIAGNOSIS — E039 Hypothyroidism, unspecified: Secondary | ICD-10-CM | POA: Diagnosis not present

## 2020-03-10 DIAGNOSIS — E1122 Type 2 diabetes mellitus with diabetic chronic kidney disease: Secondary | ICD-10-CM | POA: Diagnosis not present

## 2020-03-10 DIAGNOSIS — I129 Hypertensive chronic kidney disease with stage 1 through stage 4 chronic kidney disease, or unspecified chronic kidney disease: Secondary | ICD-10-CM | POA: Diagnosis not present

## 2020-03-10 DIAGNOSIS — G40909 Epilepsy, unspecified, not intractable, without status epilepticus: Secondary | ICD-10-CM | POA: Diagnosis not present

## 2020-03-10 DIAGNOSIS — N390 Urinary tract infection, site not specified: Secondary | ICD-10-CM | POA: Diagnosis not present

## 2020-03-10 DIAGNOSIS — N400 Enlarged prostate without lower urinary tract symptoms: Secondary | ICD-10-CM | POA: Diagnosis not present

## 2020-03-10 DIAGNOSIS — E1151 Type 2 diabetes mellitus with diabetic peripheral angiopathy without gangrene: Secondary | ICD-10-CM | POA: Diagnosis not present

## 2020-03-10 DIAGNOSIS — N189 Chronic kidney disease, unspecified: Secondary | ICD-10-CM | POA: Diagnosis not present

## 2020-03-10 DIAGNOSIS — F319 Bipolar disorder, unspecified: Secondary | ICD-10-CM | POA: Diagnosis not present

## 2020-03-12 DIAGNOSIS — F319 Bipolar disorder, unspecified: Secondary | ICD-10-CM | POA: Diagnosis not present

## 2020-03-12 DIAGNOSIS — E039 Hypothyroidism, unspecified: Secondary | ICD-10-CM | POA: Diagnosis not present

## 2020-03-12 DIAGNOSIS — E1122 Type 2 diabetes mellitus with diabetic chronic kidney disease: Secondary | ICD-10-CM | POA: Diagnosis not present

## 2020-03-12 DIAGNOSIS — N400 Enlarged prostate without lower urinary tract symptoms: Secondary | ICD-10-CM | POA: Diagnosis not present

## 2020-03-12 DIAGNOSIS — E1151 Type 2 diabetes mellitus with diabetic peripheral angiopathy without gangrene: Secondary | ICD-10-CM | POA: Diagnosis not present

## 2020-03-12 DIAGNOSIS — G40909 Epilepsy, unspecified, not intractable, without status epilepticus: Secondary | ICD-10-CM | POA: Diagnosis not present

## 2020-03-12 DIAGNOSIS — I129 Hypertensive chronic kidney disease with stage 1 through stage 4 chronic kidney disease, or unspecified chronic kidney disease: Secondary | ICD-10-CM | POA: Diagnosis not present

## 2020-03-12 DIAGNOSIS — N189 Chronic kidney disease, unspecified: Secondary | ICD-10-CM | POA: Diagnosis not present

## 2020-03-12 DIAGNOSIS — N390 Urinary tract infection, site not specified: Secondary | ICD-10-CM | POA: Diagnosis not present

## 2020-03-16 DIAGNOSIS — N189 Chronic kidney disease, unspecified: Secondary | ICD-10-CM | POA: Diagnosis not present

## 2020-03-16 DIAGNOSIS — I129 Hypertensive chronic kidney disease with stage 1 through stage 4 chronic kidney disease, or unspecified chronic kidney disease: Secondary | ICD-10-CM | POA: Diagnosis not present

## 2020-03-16 DIAGNOSIS — N390 Urinary tract infection, site not specified: Secondary | ICD-10-CM | POA: Diagnosis not present

## 2020-03-16 DIAGNOSIS — F319 Bipolar disorder, unspecified: Secondary | ICD-10-CM | POA: Diagnosis not present

## 2020-03-16 DIAGNOSIS — E1122 Type 2 diabetes mellitus with diabetic chronic kidney disease: Secondary | ICD-10-CM | POA: Diagnosis not present

## 2020-03-16 DIAGNOSIS — N400 Enlarged prostate without lower urinary tract symptoms: Secondary | ICD-10-CM | POA: Diagnosis not present

## 2020-03-16 DIAGNOSIS — E039 Hypothyroidism, unspecified: Secondary | ICD-10-CM | POA: Diagnosis not present

## 2020-03-16 DIAGNOSIS — G40909 Epilepsy, unspecified, not intractable, without status epilepticus: Secondary | ICD-10-CM | POA: Diagnosis not present

## 2020-03-16 DIAGNOSIS — E1151 Type 2 diabetes mellitus with diabetic peripheral angiopathy without gangrene: Secondary | ICD-10-CM | POA: Diagnosis not present

## 2020-03-17 DIAGNOSIS — N390 Urinary tract infection, site not specified: Secondary | ICD-10-CM | POA: Diagnosis not present

## 2020-03-17 DIAGNOSIS — N400 Enlarged prostate without lower urinary tract symptoms: Secondary | ICD-10-CM | POA: Diagnosis not present

## 2020-03-17 DIAGNOSIS — N189 Chronic kidney disease, unspecified: Secondary | ICD-10-CM | POA: Diagnosis not present

## 2020-03-17 DIAGNOSIS — E039 Hypothyroidism, unspecified: Secondary | ICD-10-CM | POA: Diagnosis not present

## 2020-03-17 DIAGNOSIS — E1122 Type 2 diabetes mellitus with diabetic chronic kidney disease: Secondary | ICD-10-CM | POA: Diagnosis not present

## 2020-03-17 DIAGNOSIS — I129 Hypertensive chronic kidney disease with stage 1 through stage 4 chronic kidney disease, or unspecified chronic kidney disease: Secondary | ICD-10-CM | POA: Diagnosis not present

## 2020-03-17 DIAGNOSIS — E1151 Type 2 diabetes mellitus with diabetic peripheral angiopathy without gangrene: Secondary | ICD-10-CM | POA: Diagnosis not present

## 2020-03-17 DIAGNOSIS — G40909 Epilepsy, unspecified, not intractable, without status epilepticus: Secondary | ICD-10-CM | POA: Diagnosis not present

## 2020-03-17 DIAGNOSIS — F319 Bipolar disorder, unspecified: Secondary | ICD-10-CM | POA: Diagnosis not present

## 2020-03-20 DIAGNOSIS — I129 Hypertensive chronic kidney disease with stage 1 through stage 4 chronic kidney disease, or unspecified chronic kidney disease: Secondary | ICD-10-CM | POA: Diagnosis not present

## 2020-03-20 DIAGNOSIS — G40909 Epilepsy, unspecified, not intractable, without status epilepticus: Secondary | ICD-10-CM | POA: Diagnosis not present

## 2020-03-20 DIAGNOSIS — N390 Urinary tract infection, site not specified: Secondary | ICD-10-CM | POA: Diagnosis not present

## 2020-03-20 DIAGNOSIS — N189 Chronic kidney disease, unspecified: Secondary | ICD-10-CM | POA: Diagnosis not present

## 2020-03-20 DIAGNOSIS — F319 Bipolar disorder, unspecified: Secondary | ICD-10-CM | POA: Diagnosis not present

## 2020-03-20 DIAGNOSIS — E1151 Type 2 diabetes mellitus with diabetic peripheral angiopathy without gangrene: Secondary | ICD-10-CM | POA: Diagnosis not present

## 2020-03-20 DIAGNOSIS — E039 Hypothyroidism, unspecified: Secondary | ICD-10-CM | POA: Diagnosis not present

## 2020-03-20 DIAGNOSIS — N400 Enlarged prostate without lower urinary tract symptoms: Secondary | ICD-10-CM | POA: Diagnosis not present

## 2020-03-20 DIAGNOSIS — E1122 Type 2 diabetes mellitus with diabetic chronic kidney disease: Secondary | ICD-10-CM | POA: Diagnosis not present

## 2020-03-23 DIAGNOSIS — F319 Bipolar disorder, unspecified: Secondary | ICD-10-CM | POA: Diagnosis not present

## 2020-03-23 DIAGNOSIS — N400 Enlarged prostate without lower urinary tract symptoms: Secondary | ICD-10-CM | POA: Diagnosis not present

## 2020-03-23 DIAGNOSIS — E039 Hypothyroidism, unspecified: Secondary | ICD-10-CM | POA: Diagnosis not present

## 2020-03-23 DIAGNOSIS — E1151 Type 2 diabetes mellitus with diabetic peripheral angiopathy without gangrene: Secondary | ICD-10-CM | POA: Diagnosis not present

## 2020-03-23 DIAGNOSIS — E1122 Type 2 diabetes mellitus with diabetic chronic kidney disease: Secondary | ICD-10-CM | POA: Diagnosis not present

## 2020-03-23 DIAGNOSIS — N390 Urinary tract infection, site not specified: Secondary | ICD-10-CM | POA: Diagnosis not present

## 2020-03-23 DIAGNOSIS — N189 Chronic kidney disease, unspecified: Secondary | ICD-10-CM | POA: Diagnosis not present

## 2020-03-23 DIAGNOSIS — I129 Hypertensive chronic kidney disease with stage 1 through stage 4 chronic kidney disease, or unspecified chronic kidney disease: Secondary | ICD-10-CM | POA: Diagnosis not present

## 2020-03-23 DIAGNOSIS — G40909 Epilepsy, unspecified, not intractable, without status epilepticus: Secondary | ICD-10-CM | POA: Diagnosis not present

## 2020-03-24 DIAGNOSIS — G3109 Other frontotemporal dementia: Secondary | ICD-10-CM | POA: Diagnosis not present

## 2020-03-24 DIAGNOSIS — N1831 Chronic kidney disease, stage 3a: Secondary | ICD-10-CM | POA: Diagnosis not present

## 2020-03-24 DIAGNOSIS — E039 Hypothyroidism, unspecified: Secondary | ICD-10-CM | POA: Diagnosis not present

## 2020-03-24 DIAGNOSIS — G20 Parkinson's disease: Secondary | ICD-10-CM | POA: Diagnosis not present

## 2020-03-24 DIAGNOSIS — Z8701 Personal history of pneumonia (recurrent): Secondary | ICD-10-CM | POA: Diagnosis not present

## 2020-03-24 DIAGNOSIS — I10 Essential (primary) hypertension: Secondary | ICD-10-CM | POA: Diagnosis not present

## 2020-03-24 DIAGNOSIS — E1129 Type 2 diabetes mellitus with other diabetic kidney complication: Secondary | ICD-10-CM | POA: Diagnosis not present

## 2020-03-26 DIAGNOSIS — I129 Hypertensive chronic kidney disease with stage 1 through stage 4 chronic kidney disease, or unspecified chronic kidney disease: Secondary | ICD-10-CM | POA: Diagnosis not present

## 2020-03-26 DIAGNOSIS — F319 Bipolar disorder, unspecified: Secondary | ICD-10-CM | POA: Diagnosis not present

## 2020-03-26 DIAGNOSIS — N390 Urinary tract infection, site not specified: Secondary | ICD-10-CM | POA: Diagnosis not present

## 2020-03-26 DIAGNOSIS — E1151 Type 2 diabetes mellitus with diabetic peripheral angiopathy without gangrene: Secondary | ICD-10-CM | POA: Diagnosis not present

## 2020-03-26 DIAGNOSIS — G40909 Epilepsy, unspecified, not intractable, without status epilepticus: Secondary | ICD-10-CM | POA: Diagnosis not present

## 2020-03-26 DIAGNOSIS — N400 Enlarged prostate without lower urinary tract symptoms: Secondary | ICD-10-CM | POA: Diagnosis not present

## 2020-03-26 DIAGNOSIS — N189 Chronic kidney disease, unspecified: Secondary | ICD-10-CM | POA: Diagnosis not present

## 2020-03-26 DIAGNOSIS — E1122 Type 2 diabetes mellitus with diabetic chronic kidney disease: Secondary | ICD-10-CM | POA: Diagnosis not present

## 2020-03-26 DIAGNOSIS — E039 Hypothyroidism, unspecified: Secondary | ICD-10-CM | POA: Diagnosis not present

## 2020-03-29 DIAGNOSIS — E039 Hypothyroidism, unspecified: Secondary | ICD-10-CM | POA: Diagnosis not present

## 2020-03-29 DIAGNOSIS — N189 Chronic kidney disease, unspecified: Secondary | ICD-10-CM | POA: Diagnosis not present

## 2020-03-29 DIAGNOSIS — F319 Bipolar disorder, unspecified: Secondary | ICD-10-CM | POA: Diagnosis not present

## 2020-03-29 DIAGNOSIS — E1122 Type 2 diabetes mellitus with diabetic chronic kidney disease: Secondary | ICD-10-CM | POA: Diagnosis not present

## 2020-03-29 DIAGNOSIS — I129 Hypertensive chronic kidney disease with stage 1 through stage 4 chronic kidney disease, or unspecified chronic kidney disease: Secondary | ICD-10-CM | POA: Diagnosis not present

## 2020-03-29 DIAGNOSIS — G40909 Epilepsy, unspecified, not intractable, without status epilepticus: Secondary | ICD-10-CM | POA: Diagnosis not present

## 2020-03-29 DIAGNOSIS — N400 Enlarged prostate without lower urinary tract symptoms: Secondary | ICD-10-CM | POA: Diagnosis not present

## 2020-03-29 DIAGNOSIS — E1151 Type 2 diabetes mellitus with diabetic peripheral angiopathy without gangrene: Secondary | ICD-10-CM | POA: Diagnosis not present

## 2020-03-29 DIAGNOSIS — N390 Urinary tract infection, site not specified: Secondary | ICD-10-CM | POA: Diagnosis not present

## 2020-03-30 DIAGNOSIS — F319 Bipolar disorder, unspecified: Secondary | ICD-10-CM | POA: Diagnosis not present

## 2020-03-30 DIAGNOSIS — E1122 Type 2 diabetes mellitus with diabetic chronic kidney disease: Secondary | ICD-10-CM | POA: Diagnosis not present

## 2020-03-30 DIAGNOSIS — G40909 Epilepsy, unspecified, not intractable, without status epilepticus: Secondary | ICD-10-CM | POA: Diagnosis not present

## 2020-03-30 DIAGNOSIS — E1151 Type 2 diabetes mellitus with diabetic peripheral angiopathy without gangrene: Secondary | ICD-10-CM | POA: Diagnosis not present

## 2020-03-30 DIAGNOSIS — N400 Enlarged prostate without lower urinary tract symptoms: Secondary | ICD-10-CM | POA: Diagnosis not present

## 2020-03-30 DIAGNOSIS — E039 Hypothyroidism, unspecified: Secondary | ICD-10-CM | POA: Diagnosis not present

## 2020-03-30 DIAGNOSIS — N390 Urinary tract infection, site not specified: Secondary | ICD-10-CM | POA: Diagnosis not present

## 2020-03-30 DIAGNOSIS — I129 Hypertensive chronic kidney disease with stage 1 through stage 4 chronic kidney disease, or unspecified chronic kidney disease: Secondary | ICD-10-CM | POA: Diagnosis not present

## 2020-03-30 DIAGNOSIS — N189 Chronic kidney disease, unspecified: Secondary | ICD-10-CM | POA: Diagnosis not present

## 2020-03-31 DIAGNOSIS — G40909 Epilepsy, unspecified, not intractable, without status epilepticus: Secondary | ICD-10-CM | POA: Diagnosis not present

## 2020-03-31 DIAGNOSIS — N400 Enlarged prostate without lower urinary tract symptoms: Secondary | ICD-10-CM | POA: Diagnosis not present

## 2020-03-31 DIAGNOSIS — E039 Hypothyroidism, unspecified: Secondary | ICD-10-CM | POA: Diagnosis not present

## 2020-03-31 DIAGNOSIS — E1122 Type 2 diabetes mellitus with diabetic chronic kidney disease: Secondary | ICD-10-CM | POA: Diagnosis not present

## 2020-03-31 DIAGNOSIS — I129 Hypertensive chronic kidney disease with stage 1 through stage 4 chronic kidney disease, or unspecified chronic kidney disease: Secondary | ICD-10-CM | POA: Diagnosis not present

## 2020-03-31 DIAGNOSIS — N189 Chronic kidney disease, unspecified: Secondary | ICD-10-CM | POA: Diagnosis not present

## 2020-03-31 DIAGNOSIS — E1151 Type 2 diabetes mellitus with diabetic peripheral angiopathy without gangrene: Secondary | ICD-10-CM | POA: Diagnosis not present

## 2020-03-31 DIAGNOSIS — F319 Bipolar disorder, unspecified: Secondary | ICD-10-CM | POA: Diagnosis not present

## 2020-03-31 DIAGNOSIS — N390 Urinary tract infection, site not specified: Secondary | ICD-10-CM | POA: Diagnosis not present

## 2020-04-02 DIAGNOSIS — N400 Enlarged prostate without lower urinary tract symptoms: Secondary | ICD-10-CM | POA: Diagnosis not present

## 2020-04-02 DIAGNOSIS — E1151 Type 2 diabetes mellitus with diabetic peripheral angiopathy without gangrene: Secondary | ICD-10-CM | POA: Diagnosis not present

## 2020-04-02 DIAGNOSIS — E039 Hypothyroidism, unspecified: Secondary | ICD-10-CM | POA: Diagnosis not present

## 2020-04-02 DIAGNOSIS — F319 Bipolar disorder, unspecified: Secondary | ICD-10-CM | POA: Diagnosis not present

## 2020-04-02 DIAGNOSIS — N189 Chronic kidney disease, unspecified: Secondary | ICD-10-CM | POA: Diagnosis not present

## 2020-04-02 DIAGNOSIS — G40909 Epilepsy, unspecified, not intractable, without status epilepticus: Secondary | ICD-10-CM | POA: Diagnosis not present

## 2020-04-02 DIAGNOSIS — E1122 Type 2 diabetes mellitus with diabetic chronic kidney disease: Secondary | ICD-10-CM | POA: Diagnosis not present

## 2020-04-02 DIAGNOSIS — I129 Hypertensive chronic kidney disease with stage 1 through stage 4 chronic kidney disease, or unspecified chronic kidney disease: Secondary | ICD-10-CM | POA: Diagnosis not present

## 2020-04-02 DIAGNOSIS — N390 Urinary tract infection, site not specified: Secondary | ICD-10-CM | POA: Diagnosis not present

## 2020-04-06 DIAGNOSIS — E039 Hypothyroidism, unspecified: Secondary | ICD-10-CM | POA: Diagnosis not present

## 2020-04-06 DIAGNOSIS — E1122 Type 2 diabetes mellitus with diabetic chronic kidney disease: Secondary | ICD-10-CM | POA: Diagnosis not present

## 2020-04-06 DIAGNOSIS — F319 Bipolar disorder, unspecified: Secondary | ICD-10-CM | POA: Diagnosis not present

## 2020-04-06 DIAGNOSIS — G40909 Epilepsy, unspecified, not intractable, without status epilepticus: Secondary | ICD-10-CM | POA: Diagnosis not present

## 2020-04-06 DIAGNOSIS — E1151 Type 2 diabetes mellitus with diabetic peripheral angiopathy without gangrene: Secondary | ICD-10-CM | POA: Diagnosis not present

## 2020-04-06 DIAGNOSIS — N400 Enlarged prostate without lower urinary tract symptoms: Secondary | ICD-10-CM | POA: Diagnosis not present

## 2020-04-06 DIAGNOSIS — N189 Chronic kidney disease, unspecified: Secondary | ICD-10-CM | POA: Diagnosis not present

## 2020-04-06 DIAGNOSIS — N390 Urinary tract infection, site not specified: Secondary | ICD-10-CM | POA: Diagnosis not present

## 2020-04-06 DIAGNOSIS — I129 Hypertensive chronic kidney disease with stage 1 through stage 4 chronic kidney disease, or unspecified chronic kidney disease: Secondary | ICD-10-CM | POA: Diagnosis not present

## 2020-04-07 DIAGNOSIS — I129 Hypertensive chronic kidney disease with stage 1 through stage 4 chronic kidney disease, or unspecified chronic kidney disease: Secondary | ICD-10-CM | POA: Diagnosis not present

## 2020-04-07 DIAGNOSIS — E039 Hypothyroidism, unspecified: Secondary | ICD-10-CM | POA: Diagnosis not present

## 2020-04-07 DIAGNOSIS — N390 Urinary tract infection, site not specified: Secondary | ICD-10-CM | POA: Diagnosis not present

## 2020-04-07 DIAGNOSIS — N189 Chronic kidney disease, unspecified: Secondary | ICD-10-CM | POA: Diagnosis not present

## 2020-04-07 DIAGNOSIS — N400 Enlarged prostate without lower urinary tract symptoms: Secondary | ICD-10-CM | POA: Diagnosis not present

## 2020-04-07 DIAGNOSIS — G40909 Epilepsy, unspecified, not intractable, without status epilepticus: Secondary | ICD-10-CM | POA: Diagnosis not present

## 2020-04-07 DIAGNOSIS — F319 Bipolar disorder, unspecified: Secondary | ICD-10-CM | POA: Diagnosis not present

## 2020-04-07 DIAGNOSIS — E1151 Type 2 diabetes mellitus with diabetic peripheral angiopathy without gangrene: Secondary | ICD-10-CM | POA: Diagnosis not present

## 2020-04-07 DIAGNOSIS — E1122 Type 2 diabetes mellitus with diabetic chronic kidney disease: Secondary | ICD-10-CM | POA: Diagnosis not present

## 2020-04-08 DIAGNOSIS — R1312 Dysphagia, oropharyngeal phase: Secondary | ICD-10-CM | POA: Diagnosis not present

## 2020-04-09 DIAGNOSIS — I129 Hypertensive chronic kidney disease with stage 1 through stage 4 chronic kidney disease, or unspecified chronic kidney disease: Secondary | ICD-10-CM | POA: Diagnosis not present

## 2020-04-09 DIAGNOSIS — G40909 Epilepsy, unspecified, not intractable, without status epilepticus: Secondary | ICD-10-CM | POA: Diagnosis not present

## 2020-04-09 DIAGNOSIS — F319 Bipolar disorder, unspecified: Secondary | ICD-10-CM | POA: Diagnosis not present

## 2020-04-09 DIAGNOSIS — N189 Chronic kidney disease, unspecified: Secondary | ICD-10-CM | POA: Diagnosis not present

## 2020-04-09 DIAGNOSIS — N400 Enlarged prostate without lower urinary tract symptoms: Secondary | ICD-10-CM | POA: Diagnosis not present

## 2020-04-09 DIAGNOSIS — E1122 Type 2 diabetes mellitus with diabetic chronic kidney disease: Secondary | ICD-10-CM | POA: Diagnosis not present

## 2020-04-09 DIAGNOSIS — E1151 Type 2 diabetes mellitus with diabetic peripheral angiopathy without gangrene: Secondary | ICD-10-CM | POA: Diagnosis not present

## 2020-04-09 DIAGNOSIS — E039 Hypothyroidism, unspecified: Secondary | ICD-10-CM | POA: Diagnosis not present

## 2020-04-09 DIAGNOSIS — N390 Urinary tract infection, site not specified: Secondary | ICD-10-CM | POA: Diagnosis not present

## 2020-04-12 ENCOUNTER — Ambulatory Visit: Payer: Medicare HMO | Admitting: Podiatry

## 2020-04-12 DIAGNOSIS — N189 Chronic kidney disease, unspecified: Secondary | ICD-10-CM | POA: Diagnosis not present

## 2020-04-12 DIAGNOSIS — E1151 Type 2 diabetes mellitus with diabetic peripheral angiopathy without gangrene: Secondary | ICD-10-CM | POA: Diagnosis not present

## 2020-04-12 DIAGNOSIS — I129 Hypertensive chronic kidney disease with stage 1 through stage 4 chronic kidney disease, or unspecified chronic kidney disease: Secondary | ICD-10-CM | POA: Diagnosis not present

## 2020-04-12 DIAGNOSIS — F319 Bipolar disorder, unspecified: Secondary | ICD-10-CM | POA: Diagnosis not present

## 2020-04-12 DIAGNOSIS — E039 Hypothyroidism, unspecified: Secondary | ICD-10-CM | POA: Diagnosis not present

## 2020-04-12 DIAGNOSIS — G40909 Epilepsy, unspecified, not intractable, without status epilepticus: Secondary | ICD-10-CM | POA: Diagnosis not present

## 2020-04-12 DIAGNOSIS — N390 Urinary tract infection, site not specified: Secondary | ICD-10-CM | POA: Diagnosis not present

## 2020-04-12 DIAGNOSIS — E1122 Type 2 diabetes mellitus with diabetic chronic kidney disease: Secondary | ICD-10-CM | POA: Diagnosis not present

## 2020-04-12 DIAGNOSIS — N400 Enlarged prostate without lower urinary tract symptoms: Secondary | ICD-10-CM | POA: Diagnosis not present

## 2020-04-14 DIAGNOSIS — F319 Bipolar disorder, unspecified: Secondary | ICD-10-CM | POA: Diagnosis not present

## 2020-04-14 DIAGNOSIS — N390 Urinary tract infection, site not specified: Secondary | ICD-10-CM | POA: Diagnosis not present

## 2020-04-14 DIAGNOSIS — N189 Chronic kidney disease, unspecified: Secondary | ICD-10-CM | POA: Diagnosis not present

## 2020-04-14 DIAGNOSIS — E1122 Type 2 diabetes mellitus with diabetic chronic kidney disease: Secondary | ICD-10-CM | POA: Diagnosis not present

## 2020-04-14 DIAGNOSIS — E039 Hypothyroidism, unspecified: Secondary | ICD-10-CM | POA: Diagnosis not present

## 2020-04-14 DIAGNOSIS — I129 Hypertensive chronic kidney disease with stage 1 through stage 4 chronic kidney disease, or unspecified chronic kidney disease: Secondary | ICD-10-CM | POA: Diagnosis not present

## 2020-04-14 DIAGNOSIS — E1151 Type 2 diabetes mellitus with diabetic peripheral angiopathy without gangrene: Secondary | ICD-10-CM | POA: Diagnosis not present

## 2020-04-14 DIAGNOSIS — N400 Enlarged prostate without lower urinary tract symptoms: Secondary | ICD-10-CM | POA: Diagnosis not present

## 2020-04-14 DIAGNOSIS — G40909 Epilepsy, unspecified, not intractable, without status epilepticus: Secondary | ICD-10-CM | POA: Diagnosis not present

## 2020-04-15 DIAGNOSIS — N400 Enlarged prostate without lower urinary tract symptoms: Secondary | ICD-10-CM | POA: Diagnosis not present

## 2020-04-15 DIAGNOSIS — E1122 Type 2 diabetes mellitus with diabetic chronic kidney disease: Secondary | ICD-10-CM | POA: Diagnosis not present

## 2020-04-15 DIAGNOSIS — N390 Urinary tract infection, site not specified: Secondary | ICD-10-CM | POA: Diagnosis not present

## 2020-04-15 DIAGNOSIS — E039 Hypothyroidism, unspecified: Secondary | ICD-10-CM | POA: Diagnosis not present

## 2020-04-15 DIAGNOSIS — F319 Bipolar disorder, unspecified: Secondary | ICD-10-CM | POA: Diagnosis not present

## 2020-04-15 DIAGNOSIS — I129 Hypertensive chronic kidney disease with stage 1 through stage 4 chronic kidney disease, or unspecified chronic kidney disease: Secondary | ICD-10-CM | POA: Diagnosis not present

## 2020-04-15 DIAGNOSIS — G40909 Epilepsy, unspecified, not intractable, without status epilepticus: Secondary | ICD-10-CM | POA: Diagnosis not present

## 2020-04-15 DIAGNOSIS — E1151 Type 2 diabetes mellitus with diabetic peripheral angiopathy without gangrene: Secondary | ICD-10-CM | POA: Diagnosis not present

## 2020-04-15 DIAGNOSIS — N189 Chronic kidney disease, unspecified: Secondary | ICD-10-CM | POA: Diagnosis not present

## 2020-04-17 ENCOUNTER — Other Ambulatory Visit: Payer: Self-pay | Admitting: Gastroenterology

## 2020-05-03 ENCOUNTER — Ambulatory Visit: Payer: Medicare HMO | Admitting: Podiatry

## 2020-05-06 ENCOUNTER — Telehealth (HOSPITAL_COMMUNITY): Payer: Medicare HMO | Admitting: Psychiatry

## 2020-06-08 DIAGNOSIS — F0280 Dementia in other diseases classified elsewhere without behavioral disturbance: Secondary | ICD-10-CM | POA: Diagnosis not present

## 2020-06-08 DIAGNOSIS — R269 Unspecified abnormalities of gait and mobility: Secondary | ICD-10-CM | POA: Diagnosis not present

## 2020-06-08 DIAGNOSIS — G63 Polyneuropathy in diseases classified elsewhere: Secondary | ICD-10-CM | POA: Diagnosis not present

## 2020-06-08 DIAGNOSIS — R251 Tremor, unspecified: Secondary | ICD-10-CM | POA: Diagnosis not present

## 2020-06-08 DIAGNOSIS — G20 Parkinson's disease: Secondary | ICD-10-CM | POA: Diagnosis not present

## 2020-06-08 DIAGNOSIS — G219 Secondary parkinsonism, unspecified: Secondary | ICD-10-CM | POA: Diagnosis not present

## 2020-06-08 DIAGNOSIS — G8929 Other chronic pain: Secondary | ICD-10-CM | POA: Insufficient documentation

## 2020-06-22 DIAGNOSIS — E785 Hyperlipidemia, unspecified: Secondary | ICD-10-CM | POA: Diagnosis not present

## 2020-06-22 DIAGNOSIS — E1129 Type 2 diabetes mellitus with other diabetic kidney complication: Secondary | ICD-10-CM | POA: Diagnosis not present

## 2020-06-22 DIAGNOSIS — E538 Deficiency of other specified B group vitamins: Secondary | ICD-10-CM | POA: Diagnosis not present

## 2020-06-22 DIAGNOSIS — E039 Hypothyroidism, unspecified: Secondary | ICD-10-CM | POA: Diagnosis not present

## 2020-06-22 DIAGNOSIS — Z0000 Encounter for general adult medical examination without abnormal findings: Secondary | ICD-10-CM | POA: Diagnosis not present

## 2020-06-22 DIAGNOSIS — N1831 Chronic kidney disease, stage 3a: Secondary | ICD-10-CM | POA: Diagnosis not present

## 2020-06-22 DIAGNOSIS — E539 Vitamin B deficiency, unspecified: Secondary | ICD-10-CM | POA: Diagnosis not present

## 2020-06-24 DIAGNOSIS — Z23 Encounter for immunization: Secondary | ICD-10-CM | POA: Diagnosis not present

## 2020-06-24 DIAGNOSIS — Z1331 Encounter for screening for depression: Secondary | ICD-10-CM | POA: Diagnosis not present

## 2020-06-24 DIAGNOSIS — E785 Hyperlipidemia, unspecified: Secondary | ICD-10-CM | POA: Diagnosis not present

## 2020-06-24 DIAGNOSIS — Z0000 Encounter for general adult medical examination without abnormal findings: Secondary | ICD-10-CM | POA: Diagnosis not present

## 2020-06-24 DIAGNOSIS — G3109 Other frontotemporal dementia: Secondary | ICD-10-CM | POA: Diagnosis not present

## 2020-06-24 DIAGNOSIS — E039 Hypothyroidism, unspecified: Secondary | ICD-10-CM | POA: Diagnosis not present

## 2020-06-24 DIAGNOSIS — F339 Major depressive disorder, recurrent, unspecified: Secondary | ICD-10-CM | POA: Diagnosis not present

## 2020-06-24 DIAGNOSIS — E1129 Type 2 diabetes mellitus with other diabetic kidney complication: Secondary | ICD-10-CM | POA: Diagnosis not present

## 2020-06-24 DIAGNOSIS — G20 Parkinson's disease: Secondary | ICD-10-CM | POA: Diagnosis not present

## 2020-06-24 DIAGNOSIS — J45909 Unspecified asthma, uncomplicated: Secondary | ICD-10-CM | POA: Diagnosis not present

## 2020-06-24 DIAGNOSIS — F319 Bipolar disorder, unspecified: Secondary | ICD-10-CM | POA: Diagnosis not present

## 2020-06-24 DIAGNOSIS — N1831 Chronic kidney disease, stage 3a: Secondary | ICD-10-CM | POA: Diagnosis not present

## 2020-06-25 DIAGNOSIS — Z23 Encounter for immunization: Secondary | ICD-10-CM | POA: Diagnosis not present

## 2020-08-11 ENCOUNTER — Other Ambulatory Visit: Payer: Self-pay | Admitting: Gastroenterology

## 2020-08-17 DIAGNOSIS — S0990XA Unspecified injury of head, initial encounter: Secondary | ICD-10-CM | POA: Diagnosis not present

## 2020-08-17 DIAGNOSIS — G4733 Obstructive sleep apnea (adult) (pediatric): Secondary | ICD-10-CM | POA: Diagnosis not present

## 2020-08-17 DIAGNOSIS — G63 Polyneuropathy in diseases classified elsewhere: Secondary | ICD-10-CM | POA: Diagnosis not present

## 2020-08-17 DIAGNOSIS — R251 Tremor, unspecified: Secondary | ICD-10-CM | POA: Diagnosis not present

## 2020-08-17 DIAGNOSIS — R269 Unspecified abnormalities of gait and mobility: Secondary | ICD-10-CM | POA: Diagnosis not present

## 2020-08-17 DIAGNOSIS — G20 Parkinson's disease: Secondary | ICD-10-CM | POA: Diagnosis not present

## 2020-08-17 DIAGNOSIS — G219 Secondary parkinsonism, unspecified: Secondary | ICD-10-CM | POA: Diagnosis not present

## 2020-08-17 DIAGNOSIS — R519 Headache, unspecified: Secondary | ICD-10-CM | POA: Diagnosis not present

## 2020-08-17 DIAGNOSIS — F319 Bipolar disorder, unspecified: Secondary | ICD-10-CM | POA: Diagnosis not present

## 2020-08-19 DIAGNOSIS — E1129 Type 2 diabetes mellitus with other diabetic kidney complication: Secondary | ICD-10-CM | POA: Diagnosis not present

## 2020-08-19 DIAGNOSIS — G3109 Other frontotemporal dementia: Secondary | ICD-10-CM | POA: Diagnosis not present

## 2020-08-19 DIAGNOSIS — R296 Repeated falls: Secondary | ICD-10-CM | POA: Diagnosis not present

## 2020-08-19 DIAGNOSIS — G20 Parkinson's disease: Secondary | ICD-10-CM | POA: Diagnosis not present

## 2020-08-20 DIAGNOSIS — S0990XA Unspecified injury of head, initial encounter: Secondary | ICD-10-CM | POA: Diagnosis not present

## 2020-08-20 DIAGNOSIS — R519 Headache, unspecified: Secondary | ICD-10-CM | POA: Diagnosis not present

## 2020-10-15 DIAGNOSIS — E1129 Type 2 diabetes mellitus with other diabetic kidney complication: Secondary | ICD-10-CM | POA: Diagnosis not present

## 2020-10-15 DIAGNOSIS — G20 Parkinson's disease: Secondary | ICD-10-CM | POA: Diagnosis not present

## 2020-10-15 DIAGNOSIS — M6281 Muscle weakness (generalized): Secondary | ICD-10-CM | POA: Diagnosis not present

## 2020-10-15 DIAGNOSIS — R410 Disorientation, unspecified: Secondary | ICD-10-CM | POA: Diagnosis not present

## 2020-11-04 DIAGNOSIS — R269 Unspecified abnormalities of gait and mobility: Secondary | ICD-10-CM | POA: Diagnosis not present

## 2020-11-04 DIAGNOSIS — F0280 Dementia in other diseases classified elsewhere without behavioral disturbance: Secondary | ICD-10-CM | POA: Diagnosis not present

## 2020-11-04 DIAGNOSIS — R519 Headache, unspecified: Secondary | ICD-10-CM | POA: Diagnosis not present

## 2020-11-04 DIAGNOSIS — R251 Tremor, unspecified: Secondary | ICD-10-CM | POA: Diagnosis not present

## 2020-11-04 DIAGNOSIS — S0990XD Unspecified injury of head, subsequent encounter: Secondary | ICD-10-CM | POA: Diagnosis not present

## 2020-11-04 DIAGNOSIS — G20 Parkinson's disease: Secondary | ICD-10-CM | POA: Diagnosis not present

## 2020-11-04 DIAGNOSIS — F319 Bipolar disorder, unspecified: Secondary | ICD-10-CM | POA: Diagnosis not present

## 2020-11-04 DIAGNOSIS — G219 Secondary parkinsonism, unspecified: Secondary | ICD-10-CM | POA: Diagnosis not present

## 2020-11-08 ENCOUNTER — Telehealth: Payer: Self-pay

## 2020-11-08 NOTE — Telephone Encounter (Signed)
Spoke with patient's fianc [name] and scheduled an in-person Palliative Consult for 11/17/20 @ 3PM  COVID screening was negative. Pets in the home but will put away before Np arrives. Patient lives with [name] and her daughter.  Consent obtained; updated Outlook/Netsmart/Team List and Epic.  Family is aware they may be receiving a call from NP the day before or day of to confirm appointment.

## 2020-11-17 ENCOUNTER — Other Ambulatory Visit: Payer: Self-pay | Admitting: Gastroenterology

## 2020-11-17 ENCOUNTER — Other Ambulatory Visit: Payer: Self-pay

## 2020-11-17 ENCOUNTER — Other Ambulatory Visit: Payer: Self-pay | Admitting: Student

## 2020-11-17 DIAGNOSIS — Z515 Encounter for palliative care: Secondary | ICD-10-CM | POA: Diagnosis not present

## 2020-11-17 NOTE — Progress Notes (Signed)
[facility] Consult Note Telephone: [phone]  Fax: [phone]  PATIENT NAME: [name] [address] [place] [address] [phone] (home)  DOB: 05-28-1941 MRN: [id number]  PRIMARY CARE PROVIDER:    Ginger Organ., MD,  [place] [address] [phone]  REFERRING PROVIDER:   Ginger Organ., MD [address] [place],  [place] [address] [phone]  RESPONSIBLE PARTY:   Extended Emergency Contact Information Primary Emergency Contact: Linebarrier,Loretta Address: [address]          [place], [place] [address] Johnnette Litter of [place] Phone: [phone] Mobile Phone: [phone] Relation: Significant other Secondary Emergency Contact: Connery,Natalie  [place] Mobile Phone: [phone] Relation: Daughter  I met face to face with patient and family in home.   ASSESSMENT AND RECOMMENDATIONS:   Advance Care Planning: Visit at the request of Dr. Brigitte Pulse for palliative consult. Visit consisted of building trust and discussions on Palliative care medicine as specialized medical care for people living with serious illness, aimed at facilitating improved quality of life through symptoms relief, assisting with advance care planning and establishing goals of care. Education provided on Palliative Medicine vs. Hospice services. Palliative care will continue to provide support to patient, family and the medical team. Ongoing assessment for further decline/changes, eligibility for hospice.   Goal of care: To receive additional help/support in the home.   Directives: DNR  Symptom Management:   Dementia due to Parkinson's disease-patient requiring more assistance with adl's, unsteady gait. Family to continue to assist with adl's, provide redirection/reorientation as needed. Continue carbidopa-levodopa ER 50-244m TID, Recommend walker for ambulation; discussed therapy referral. Monitor for  falls/safety. Referral to Palliative SW regarding support/additional resources in the home.   Bipolar I disorder-continue aripiprazole, valproate (Depakene) solution as directed.   Agitation, anxiety-patient with increased agitation, worsens each evening. Continue lorazepam prn; recommend starting olanzapine 2.579meach evening.   Follow up Palliative Care Visit: Palliative care will continue to follow for complex decision making and symptom management. Return in 4 weeks or prn.  Family /Caregiver/Community Supports: Referral made to [facility] interested in additional support in the home, possible respite stay for patient.    I spent 60 minutes providing this consultation, time includes time spent with patient/family, chart review, provider coordination, and documentation. More than 50% of the time in this consultation was spent counseling and coordinating communication.   CHIEF COMPLAINT: Palliative Medicine initial consult.  History obtained from review of EMR, discussion with primary team, and  interview with family. Records reviewed and summarized below.  HISTORY OF PRESENT ILLNESS:  Benjamin BONITOs a [age].o. year old male with multiple medical problems including dementia due to parkinson's disease, bipolar I disorder, anxiety, depression, T2DM, asthma, OSA, hyperlipidemia, microscopic colitis, BPH, CKD 3, OA, chronic headaches, hx of closed head injury, hx of CVA.  Palliative Care was asked to follow this patient by consultation request of [name] to help address advance care planning and goals of care. This is an initial visit.  Patient resides at home with fiance [name] daughter. Patient requiring more assistance with adl's. He ambulates short distances with walker, gait is shuffled. He is having increased agitation, "easily angered," behaviors worse in the evenings, "sun downs." He has bladder incontinence;  increased episodes of bowel incontinence.  Sleeps throughout the day; takes melatonin at bedtime; not taking rozerem. Fair appetite reported; patient coughs with solids and  liquids. Medications must be crushed or liquid. Three falls last month. Patient hospitalized in past 6 months for UTI, pneumonia. Denies pain, hx of chronic headaches. Topiramate was discontinued last week as headaches have improved.   CODE STATUS: DNR  PPS: 50%  HOSPICE ELIGIBILITY/DIAGNOSIS: TBD  ROS   General: NAD EYES: denies vision changes ENMT: coughing/choking with solids and liquids Cardiovascular: denies chest pain Pulmonary: occasional cough, denies increased SOB Abdomen: endorses fair appetite, endorses incontinence of bowel GU: denies dysuria, endorses incontinence of urine MSK: frequent falls reported Skin: denies rashes or wounds Neurological: endorses weakness, denies pain Psych: increased agitation Heme/lymph/immuno: denies bruises, abnormal bleeding   Physical Exam: Pulse 56, resp 16, 112/70 98% Constitutional: NAD General: A & O x 2, well groomed EYES: anicteric sclera, lids intact, no discharge  ENMT: intact hearing,oral mucous membranes moist CV: RRR, no LE edema Pulmonary: LCTA, no increased work of breathing, no cough, no audible wheezes, room air Abdomen: BS normoactive x 4 GU: deferred MSK: severe sarcopenia, decreased ROM in all extremities, no contractures of LE, non ambulatory Skin: warm and dry, no rashes or wounds on visible skin Neuro: Generalized weakness, shuffled gait Psych: non-anxious affect  Hem/lymph/immuno: no widespread bruising   PAST MEDICAL HISTORY:  Past Medical History:  Diagnosis Date  . Anxiety    Xanax as needed  . Arthritis    spinal stenosis.  . Asthma   . Barrett esophagus   . Bipolar disorder ([facility])   . BPH (benign prostatic hyperplasia)   . Cataract   . Chronic kidney disease    since NSAID's stopped -function has improved  . COPD (chronic obstructive pulmonary disease) ([facility])   .  Crohn's colitis ([facility])   . Depression    takes Cymbalta daily  . Diverticulosis 01/2007  . DM (diabetes mellitus) ([facility])    takes Januvia now, metformin stopped due to diarrhea  . ED (erectile dysfunction)   . GERD (gastroesophageal reflux disease)    uses Tums  . Glaucoma    surgery to correct  . History of colon polyps   . HLD (hyperlipidemia)    takes Lipitor daily  . HTN (hypertension)    takes Altace and Diovan daily  . Impaired hearing    "getting this checked out on Monday"  . Insomnia   . Internal hemorrhoids 01/2007  . Joint pain   . Joint swelling   . Microscopic colitis   . Nocturia   . Obesity   . Osteoarthritis   . Pneumonia 3/09  . Sleep apnea    uses CPAP;has been greater than 45yr since sleep study- Dr. AElsworth Soho   SOCIAL HX:  Social History   Tobacco Use  . Smoking status: Never Smoker  . Smokeless tobacco: Never Used  Substance Use Topics  . Alcohol use: No   FAMILY HX:  Family History  Problem Relation Age of Onset  . Esophageal cancer Paternal Grandmother   . Heart failure Mother   . Depression Mother   . Alzheimer's disease Brother   . Heart attack Father   . Pneumonia Father   . Colon cancer Neg Hx     ALLERGIES:  Allergies  Allergen Reactions  . Shellfish Allergy Anaphylaxis    Patient informed RN no problem with Betadine 08/04/2015   . Latex Rash    Reports facial rash from Cpap mask     PERTINENT MEDICATIONS:  Outpatient Encounter Medications as of 11/17/2020  Medication Sig  . acetaminophen (TYLENOL) 325 MG tablet Take  650 mg by mouth daily.  Marland Kitchen albuterol (PROAIR HFA) 108 (90 BASE) MCG/ACT inhaler Inhale 2 puffs into the lungs every 4 (four) hours as needed for wheezing or shortness of breath.   Marland Kitchen amLODipine (NORVASC) 10 MG tablet Take 10 mg by mouth daily.  Marland Kitchen atorvastatin (LIPITOR) 10 MG tablet Take 10 mg by mouth every other day.   . bismuth subsalicylate (PEPTO BISMOL) 262 MG/15ML suspension Take 30 mLs by mouth 2 (two) times daily  as needed for indigestion.   . Calcium Carbonate Antacid (TUMS PO) Take 4 tablets by mouth 4 (four) times daily as needed (heartburn). Reported on 10/06/2015  . carbidopa-levodopa (SINEMET IR) 10-100 MG tablet Take 1 tablet 3 (three) times daily by mouth.  . clopidogrel (PLAVIX) 75 MG tablet Take 75 mg by mouth daily.  . cyanocobalamin (,VITAMIN B-12,) 1000 MCG/ML injection Inject 1,000 mcg into the muscle every 30 (thirty) days.  . divalproex (DEPAKOTE ER) 500 MG 24 hr tablet Take 500 mg by mouth 2 (two) times daily.  . famotidine (PEPCID) 20 MG tablet TAKE 1 TABLET EVERY NIGHT AT BEDTIME  . Fluticasone-Salmeterol (ADVAIR) 250-50 MCG/DOSE AEPB Inhale 1 puff into the lungs daily.  . folic acid (FOLVITE) 1 MG tablet Take 1 mg by mouth daily.  Marland Kitchen HYDROcodone-acetaminophen (NORCO/VICODIN) 5-325 MG tablet TK 1 T PO Q 6 H PRF MODERATE PAIN  . hydrOXYzine (ATARAX/VISTARIL) 25 MG tablet Take 1 tablet (25 mg total) by mouth at bedtime.  Marland Kitchen ketoconazole (NIZORAL) 2 % shampoo   . levofloxacin (LEVAQUIN) 500 MG tablet   . levothyroxine (SYNTHROID) 25 MCG tablet   . levothyroxine (SYNTHROID, LEVOTHROID) 50 MCG tablet Take 50 mcg daily before breakfast by mouth.  Marland Kitchen LORazepam (ATIVAN) 1 MG tablet 1  qhs  . Melatonin 5 MG TABS Take by mouth.  . MELATONIN PO Take 9 mg daily by mouth. 2 tablets at bedtime  . memantine (NAMENDA) 10 MG tablet Take 10 mg by mouth 2 (two) times daily. Reported on 10/06/2015  . metFORMIN (GLUCOPHAGE) 500 MG tablet Take 500 mg by mouth 2 (two) times daily with a meal.  . Multiple Vitamin (MULTIVITAMIN PO) Take 1 tablet by mouth daily.   . ondansetron (ZOFRAN) 4 MG tablet   . oxycodone (OXY-IR) 5 MG capsule Take 5 mg by mouth every 4 (four) hours as needed.  . pantoprazole (PROTONIX) 40 MG tablet TAKE 1 TABLET TWICE DAILY  . QUEtiapine (SEROQUEL) 100 MG tablet Take 1 tablet (100 mg total) by mouth at bedtime.  . ramelteon (ROZEREM) 8 MG tablet Take 1 tablet (8 mg total) by mouth at  bedtime.  . tamsulosin (FLOMAX) 0.4 MG CAPS capsule Take 0.4 mg by mouth daily.  . traMADol (ULTRAM) 50 MG tablet Take 50 mg by mouth every 6 (six) hours as needed.  . traZODone (DESYREL) 50 MG tablet Take by mouth.   No facility-administered encounter medications on file as of 11/17/2020.     Thank you for the opportunity to participate in the care of Mr. Romanek. The palliative care team will continue to follow. Please call our office at [phone] if we can be of additional assistance.  Ezekiel Slocumb, NP

## 2020-11-19 ENCOUNTER — Telehealth: Payer: Self-pay

## 2020-11-19 NOTE — Telephone Encounter (Signed)
(  3:39p) Per request of NP-LaToya Rivers, palliative care SW completed follow-up call to patient's fiance-Loretta to extend support and resources for respite care. [name] thanked SW for the call, but advised that  she has already reached out to [facility]  facility regarding respite for patient. Patient had been there before and she is comfortable with that facility and will move forward with a respite placement there. [name] also advised that she picked up the prescription that was recommended by the NP and will start patient on that medication tonight. SW encouraged [name] to reach out to the NP regarding any concerns regarding this medication. SW also extended any needed ongoing support to her. No other concerns were noted.  *NP updated for follow-up.

## 2020-12-17 DIAGNOSIS — N390 Urinary tract infection, site not specified: Secondary | ICD-10-CM | POA: Diagnosis not present

## 2020-12-27 DIAGNOSIS — R300 Dysuria: Secondary | ICD-10-CM | POA: Diagnosis not present

## 2020-12-27 DIAGNOSIS — R350 Frequency of micturition: Secondary | ICD-10-CM | POA: Diagnosis not present

## 2020-12-27 DIAGNOSIS — E039 Hypothyroidism, unspecified: Secondary | ICD-10-CM | POA: Diagnosis not present

## 2020-12-27 DIAGNOSIS — I10 Essential (primary) hypertension: Secondary | ICD-10-CM | POA: Diagnosis not present

## 2020-12-27 DIAGNOSIS — F339 Major depressive disorder, recurrent, unspecified: Secondary | ICD-10-CM | POA: Diagnosis not present

## 2020-12-27 DIAGNOSIS — E785 Hyperlipidemia, unspecified: Secondary | ICD-10-CM | POA: Diagnosis not present

## 2020-12-27 DIAGNOSIS — F319 Bipolar disorder, unspecified: Secondary | ICD-10-CM | POA: Diagnosis not present

## 2020-12-27 DIAGNOSIS — J45909 Unspecified asthma, uncomplicated: Secondary | ICD-10-CM | POA: Diagnosis not present

## 2020-12-27 DIAGNOSIS — E1129 Type 2 diabetes mellitus with other diabetic kidney complication: Secondary | ICD-10-CM | POA: Diagnosis not present

## 2020-12-27 DIAGNOSIS — G20 Parkinson's disease: Secondary | ICD-10-CM | POA: Diagnosis not present

## 2020-12-27 DIAGNOSIS — N1831 Chronic kidney disease, stage 3a: Secondary | ICD-10-CM | POA: Diagnosis not present

## 2021-03-03 DIAGNOSIS — G4733 Obstructive sleep apnea (adult) (pediatric): Secondary | ICD-10-CM | POA: Diagnosis not present

## 2021-03-03 DIAGNOSIS — G3109 Other frontotemporal dementia: Secondary | ICD-10-CM | POA: Diagnosis not present

## 2021-03-03 DIAGNOSIS — F319 Bipolar disorder, unspecified: Secondary | ICD-10-CM | POA: Diagnosis not present

## 2021-03-03 DIAGNOSIS — G4440 Drug-induced headache, not elsewhere classified, not intractable: Secondary | ICD-10-CM | POA: Diagnosis not present

## 2021-03-03 DIAGNOSIS — F0280 Dementia in other diseases classified elsewhere without behavioral disturbance: Secondary | ICD-10-CM | POA: Diagnosis not present

## 2021-03-22 ENCOUNTER — Other Ambulatory Visit: Payer: Self-pay | Admitting: Gastroenterology

## 2021-04-28 DIAGNOSIS — M5416 Radiculopathy, lumbar region: Secondary | ICD-10-CM | POA: Diagnosis not present

## 2021-07-01 DIAGNOSIS — T83490A Other mechanical complication of penile (implanted) prosthesis, initial encounter: Secondary | ICD-10-CM | POA: Diagnosis not present

## 2021-07-01 DIAGNOSIS — B3749 Other urogenital candidiasis: Secondary | ICD-10-CM | POA: Diagnosis not present

## 2021-07-01 DIAGNOSIS — T839XXA Unspecified complication of genitourinary prosthetic device, implant and graft, initial encounter: Secondary | ICD-10-CM | POA: Diagnosis not present

## 2021-07-04 DIAGNOSIS — E1129 Type 2 diabetes mellitus with other diabetic kidney complication: Secondary | ICD-10-CM | POA: Diagnosis not present

## 2021-07-04 DIAGNOSIS — G3109 Other frontotemporal dementia: Secondary | ICD-10-CM | POA: Diagnosis not present

## 2021-07-04 DIAGNOSIS — E039 Hypothyroidism, unspecified: Secondary | ICD-10-CM | POA: Diagnosis not present

## 2021-07-04 DIAGNOSIS — Z0000 Encounter for general adult medical examination without abnormal findings: Secondary | ICD-10-CM | POA: Diagnosis not present

## 2021-07-04 DIAGNOSIS — G4700 Insomnia, unspecified: Secondary | ICD-10-CM | POA: Diagnosis not present

## 2021-07-04 DIAGNOSIS — E785 Hyperlipidemia, unspecified: Secondary | ICD-10-CM | POA: Diagnosis not present

## 2021-07-04 DIAGNOSIS — N1831 Chronic kidney disease, stage 3a: Secondary | ICD-10-CM | POA: Diagnosis not present

## 2021-07-04 DIAGNOSIS — F339 Major depressive disorder, recurrent, unspecified: Secondary | ICD-10-CM | POA: Diagnosis not present

## 2021-07-04 DIAGNOSIS — J45909 Unspecified asthma, uncomplicated: Secondary | ICD-10-CM | POA: Diagnosis not present

## 2021-07-04 DIAGNOSIS — Z1339 Encounter for screening examination for other mental health and behavioral disorders: Secondary | ICD-10-CM | POA: Diagnosis not present

## 2021-07-04 DIAGNOSIS — I10 Essential (primary) hypertension: Secondary | ICD-10-CM | POA: Diagnosis not present

## 2021-07-04 DIAGNOSIS — G20 Parkinson's disease: Secondary | ICD-10-CM | POA: Diagnosis not present

## 2021-07-04 DIAGNOSIS — Z1331 Encounter for screening for depression: Secondary | ICD-10-CM | POA: Diagnosis not present

## 2021-07-04 DIAGNOSIS — E669 Obesity, unspecified: Secondary | ICD-10-CM | POA: Diagnosis not present

## 2021-07-06 DIAGNOSIS — R251 Tremor, unspecified: Secondary | ICD-10-CM | POA: Diagnosis not present

## 2021-07-06 DIAGNOSIS — F319 Bipolar disorder, unspecified: Secondary | ICD-10-CM | POA: Diagnosis not present

## 2021-07-06 DIAGNOSIS — R269 Unspecified abnormalities of gait and mobility: Secondary | ICD-10-CM | POA: Diagnosis not present

## 2021-07-06 DIAGNOSIS — G4440 Drug-induced headache, not elsewhere classified, not intractable: Secondary | ICD-10-CM | POA: Diagnosis not present

## 2021-07-21 ENCOUNTER — Telehealth: Payer: Self-pay

## 2021-07-21 NOTE — Telephone Encounter (Signed)
Call to significant other [name] to check on patient.. Patient doing ok. Follow up visit scheduled for 09/01/21 for palliative

## 2021-08-14 DIAGNOSIS — I77810 Thoracic aortic ectasia: Secondary | ICD-10-CM

## 2021-08-14 DIAGNOSIS — I340 Nonrheumatic mitral (valve) insufficiency: Secondary | ICD-10-CM

## 2021-08-14 HISTORY — DX: Thoracic aortic ectasia: I77.810

## 2021-08-14 HISTORY — DX: Nonrheumatic mitral (valve) insufficiency: I34.0

## 2021-09-15 ENCOUNTER — Other Ambulatory Visit: Payer: Self-pay

## 2021-09-15 ENCOUNTER — Other Ambulatory Visit: Payer: Self-pay | Admitting: Internal Medicine

## 2021-10-05 DIAGNOSIS — G894 Chronic pain syndrome: Secondary | ICD-10-CM | POA: Diagnosis not present

## 2021-10-05 DIAGNOSIS — M549 Dorsalgia, unspecified: Secondary | ICD-10-CM | POA: Diagnosis not present

## 2021-10-05 DIAGNOSIS — Z1389 Encounter for screening for other disorder: Secondary | ICD-10-CM | POA: Diagnosis not present

## 2021-10-05 DIAGNOSIS — M47816 Spondylosis without myelopathy or radiculopathy, lumbar region: Secondary | ICD-10-CM | POA: Diagnosis not present

## 2021-10-05 DIAGNOSIS — M961 Postlaminectomy syndrome, not elsewhere classified: Secondary | ICD-10-CM | POA: Diagnosis not present

## 2021-10-05 DIAGNOSIS — M62561 Muscle wasting and atrophy, not elsewhere classified, right lower leg: Secondary | ICD-10-CM | POA: Diagnosis not present

## 2021-10-05 DIAGNOSIS — R269 Unspecified abnormalities of gait and mobility: Secondary | ICD-10-CM | POA: Diagnosis not present

## 2021-10-11 DIAGNOSIS — M47816 Spondylosis without myelopathy or radiculopathy, lumbar region: Secondary | ICD-10-CM | POA: Diagnosis not present

## 2021-10-11 DIAGNOSIS — M545 Low back pain, unspecified: Secondary | ICD-10-CM | POA: Diagnosis not present

## 2021-10-27 DIAGNOSIS — N390 Urinary tract infection, site not specified: Secondary | ICD-10-CM | POA: Diagnosis not present

## 2021-10-27 DIAGNOSIS — R509 Fever, unspecified: Secondary | ICD-10-CM | POA: Diagnosis not present

## 2021-10-27 DIAGNOSIS — I517 Cardiomegaly: Secondary | ICD-10-CM | POA: Diagnosis not present

## 2021-10-27 DIAGNOSIS — I129 Hypertensive chronic kidney disease with stage 1 through stage 4 chronic kidney disease, or unspecified chronic kidney disease: Secondary | ICD-10-CM | POA: Diagnosis not present

## 2021-10-27 DIAGNOSIS — I959 Hypotension, unspecified: Secondary | ICD-10-CM | POA: Diagnosis not present

## 2021-10-27 DIAGNOSIS — N184 Chronic kidney disease, stage 4 (severe): Secondary | ICD-10-CM | POA: Diagnosis not present

## 2021-10-27 DIAGNOSIS — J449 Chronic obstructive pulmonary disease, unspecified: Secondary | ICD-10-CM | POA: Diagnosis not present

## 2021-10-27 DIAGNOSIS — M1990 Unspecified osteoarthritis, unspecified site: Secondary | ICD-10-CM | POA: Diagnosis not present

## 2021-10-27 DIAGNOSIS — F0390 Unspecified dementia without behavioral disturbance: Secondary | ICD-10-CM | POA: Diagnosis not present

## 2021-10-27 DIAGNOSIS — Z7401 Bed confinement status: Secondary | ICD-10-CM | POA: Diagnosis not present

## 2021-10-27 DIAGNOSIS — J9811 Atelectasis: Secondary | ICD-10-CM | POA: Diagnosis not present

## 2021-10-27 DIAGNOSIS — Z043 Encounter for examination and observation following other accident: Secondary | ICD-10-CM | POA: Diagnosis not present

## 2021-10-27 DIAGNOSIS — K5730 Diverticulosis of large intestine without perforation or abscess without bleeding: Secondary | ICD-10-CM | POA: Diagnosis not present

## 2021-10-27 DIAGNOSIS — J189 Pneumonia, unspecified organism: Secondary | ICD-10-CM | POA: Diagnosis not present

## 2021-10-27 DIAGNOSIS — R410 Disorientation, unspecified: Secondary | ICD-10-CM | POA: Diagnosis not present

## 2021-10-27 DIAGNOSIS — R001 Bradycardia, unspecified: Secondary | ICD-10-CM | POA: Diagnosis not present

## 2021-10-27 DIAGNOSIS — R2689 Other abnormalities of gait and mobility: Secondary | ICD-10-CM | POA: Diagnosis not present

## 2021-10-27 DIAGNOSIS — R2681 Unsteadiness on feet: Secondary | ICD-10-CM | POA: Diagnosis not present

## 2021-10-27 DIAGNOSIS — E119 Type 2 diabetes mellitus without complications: Secondary | ICD-10-CM | POA: Diagnosis not present

## 2021-10-27 DIAGNOSIS — G20 Parkinson's disease: Secondary | ICD-10-CM | POA: Diagnosis not present

## 2021-10-27 DIAGNOSIS — J440 Chronic obstructive pulmonary disease with acute lower respiratory infection: Secondary | ICD-10-CM | POA: Diagnosis not present

## 2021-10-27 DIAGNOSIS — F419 Anxiety disorder, unspecified: Secondary | ICD-10-CM | POA: Diagnosis not present

## 2021-10-27 DIAGNOSIS — R41841 Cognitive communication deficit: Secondary | ICD-10-CM | POA: Diagnosis not present

## 2021-10-31 DIAGNOSIS — I517 Cardiomegaly: Secondary | ICD-10-CM | POA: Diagnosis not present

## 2021-10-31 DIAGNOSIS — R001 Bradycardia, unspecified: Secondary | ICD-10-CM | POA: Diagnosis not present

## 2021-11-01 DIAGNOSIS — R001 Bradycardia, unspecified: Secondary | ICD-10-CM | POA: Diagnosis not present

## 2021-11-03 DIAGNOSIS — Z7401 Bed confinement status: Secondary | ICD-10-CM | POA: Diagnosis not present

## 2021-11-03 DIAGNOSIS — J189 Pneumonia, unspecified organism: Secondary | ICD-10-CM | POA: Diagnosis not present

## 2021-11-03 DIAGNOSIS — R410 Disorientation, unspecified: Secondary | ICD-10-CM | POA: Diagnosis not present

## 2021-11-03 DIAGNOSIS — N184 Chronic kidney disease, stage 4 (severe): Secondary | ICD-10-CM | POA: Diagnosis not present

## 2021-11-03 DIAGNOSIS — R5381 Other malaise: Secondary | ICD-10-CM | POA: Diagnosis not present

## 2021-11-03 DIAGNOSIS — E119 Type 2 diabetes mellitus without complications: Secondary | ICD-10-CM | POA: Diagnosis not present

## 2021-11-03 DIAGNOSIS — J449 Chronic obstructive pulmonary disease, unspecified: Secondary | ICD-10-CM | POA: Diagnosis not present

## 2021-11-03 DIAGNOSIS — G20 Parkinson's disease: Secondary | ICD-10-CM | POA: Diagnosis not present

## 2021-11-03 DIAGNOSIS — I959 Hypotension, unspecified: Secondary | ICD-10-CM | POA: Diagnosis not present

## 2021-11-03 DIAGNOSIS — N390 Urinary tract infection, site not specified: Secondary | ICD-10-CM | POA: Diagnosis not present

## 2021-11-03 DIAGNOSIS — G40909 Epilepsy, unspecified, not intractable, without status epilepticus: Secondary | ICD-10-CM | POA: Diagnosis not present

## 2021-11-03 DIAGNOSIS — R41841 Cognitive communication deficit: Secondary | ICD-10-CM | POA: Diagnosis not present

## 2021-11-03 DIAGNOSIS — R2681 Unsteadiness on feet: Secondary | ICD-10-CM | POA: Diagnosis not present

## 2021-11-03 DIAGNOSIS — R2689 Other abnormalities of gait and mobility: Secondary | ICD-10-CM | POA: Diagnosis not present

## 2021-11-07 DIAGNOSIS — J189 Pneumonia, unspecified organism: Secondary | ICD-10-CM | POA: Diagnosis not present

## 2021-11-07 DIAGNOSIS — G20 Parkinson's disease: Secondary | ICD-10-CM | POA: Diagnosis not present

## 2021-11-07 DIAGNOSIS — G40909 Epilepsy, unspecified, not intractable, without status epilepticus: Secondary | ICD-10-CM | POA: Diagnosis not present

## 2021-11-07 DIAGNOSIS — R5381 Other malaise: Secondary | ICD-10-CM | POA: Diagnosis not present

## 2021-11-14 DIAGNOSIS — N390 Urinary tract infection, site not specified: Secondary | ICD-10-CM | POA: Diagnosis not present

## 2021-11-15 DIAGNOSIS — J189 Pneumonia, unspecified organism: Secondary | ICD-10-CM | POA: Diagnosis not present

## 2021-11-15 DIAGNOSIS — R5381 Other malaise: Secondary | ICD-10-CM | POA: Diagnosis not present

## 2021-11-15 DIAGNOSIS — G40909 Epilepsy, unspecified, not intractable, without status epilepticus: Secondary | ICD-10-CM | POA: Diagnosis not present

## 2021-11-15 DIAGNOSIS — G20 Parkinson's disease: Secondary | ICD-10-CM | POA: Diagnosis not present

## 2021-12-12 DIAGNOSIS — I959 Hypotension, unspecified: Secondary | ICD-10-CM | POA: Diagnosis not present

## 2021-12-12 DIAGNOSIS — Z87440 Personal history of urinary (tract) infections: Secondary | ICD-10-CM | POA: Diagnosis not present

## 2021-12-12 DIAGNOSIS — Z7902 Long term (current) use of antithrombotics/antiplatelets: Secondary | ICD-10-CM | POA: Diagnosis not present

## 2021-12-12 DIAGNOSIS — Z8701 Personal history of pneumonia (recurrent): Secondary | ICD-10-CM | POA: Diagnosis not present

## 2021-12-12 DIAGNOSIS — R001 Bradycardia, unspecified: Secondary | ICD-10-CM | POA: Diagnosis not present

## 2021-12-12 DIAGNOSIS — R0789 Other chest pain: Secondary | ICD-10-CM | POA: Diagnosis not present

## 2021-12-12 DIAGNOSIS — R079 Chest pain, unspecified: Secondary | ICD-10-CM | POA: Diagnosis not present

## 2021-12-12 DIAGNOSIS — Z79899 Other long term (current) drug therapy: Secondary | ICD-10-CM | POA: Diagnosis not present

## 2021-12-12 DIAGNOSIS — I10 Essential (primary) hypertension: Secondary | ICD-10-CM | POA: Diagnosis not present

## 2021-12-12 DIAGNOSIS — J449 Chronic obstructive pulmonary disease, unspecified: Secondary | ICD-10-CM | POA: Diagnosis not present

## 2021-12-12 DIAGNOSIS — E7800 Pure hypercholesterolemia, unspecified: Secondary | ICD-10-CM | POA: Diagnosis not present

## 2021-12-12 DIAGNOSIS — N289 Disorder of kidney and ureter, unspecified: Secondary | ICD-10-CM | POA: Diagnosis not present

## 2021-12-13 ENCOUNTER — Telehealth: Payer: Self-pay

## 2021-12-13 DIAGNOSIS — R001 Bradycardia, unspecified: Secondary | ICD-10-CM | POA: Diagnosis not present

## 2021-12-13 DIAGNOSIS — R0789 Other chest pain: Secondary | ICD-10-CM | POA: Diagnosis not present

## 2021-12-13 DIAGNOSIS — Z79899 Other long term (current) drug therapy: Secondary | ICD-10-CM | POA: Diagnosis not present

## 2021-12-13 DIAGNOSIS — I10 Essential (primary) hypertension: Secondary | ICD-10-CM | POA: Diagnosis not present

## 2021-12-13 NOTE — Telephone Encounter (Signed)
[facility] nurse reach out stating the patient will be discharged this week and that ED doctor's are requesting 30 day event monitor . I spoke to [place] (on [place]) and she said the Ed doctor spoke to her and said the monitor will be placed at the hospital. Patient is currently in rehab and will be there for a while. So they don't anticipate the patient leaving anytime soon. I did provide our address and contact information in case the patient is released without a monitor. She will call if needed.   ?

## 2021-12-14 DIAGNOSIS — R001 Bradycardia, unspecified: Secondary | ICD-10-CM | POA: Diagnosis not present

## 2021-12-14 DIAGNOSIS — I10 Essential (primary) hypertension: Secondary | ICD-10-CM | POA: Diagnosis not present

## 2021-12-14 DIAGNOSIS — R0789 Other chest pain: Secondary | ICD-10-CM | POA: Diagnosis not present

## 2021-12-15 DIAGNOSIS — I10 Essential (primary) hypertension: Secondary | ICD-10-CM | POA: Diagnosis not present

## 2021-12-15 DIAGNOSIS — Z79899 Other long term (current) drug therapy: Secondary | ICD-10-CM | POA: Diagnosis not present

## 2021-12-15 DIAGNOSIS — R001 Bradycardia, unspecified: Secondary | ICD-10-CM | POA: Diagnosis not present

## 2021-12-15 DIAGNOSIS — R0789 Other chest pain: Secondary | ICD-10-CM | POA: Diagnosis not present

## 2021-12-16 DIAGNOSIS — Z79899 Other long term (current) drug therapy: Secondary | ICD-10-CM | POA: Diagnosis not present

## 2021-12-16 DIAGNOSIS — I10 Essential (primary) hypertension: Secondary | ICD-10-CM | POA: Diagnosis not present

## 2021-12-16 DIAGNOSIS — G20 Parkinson's disease: Secondary | ICD-10-CM | POA: Diagnosis not present

## 2021-12-16 DIAGNOSIS — E1169 Type 2 diabetes mellitus with other specified complication: Secondary | ICD-10-CM | POA: Diagnosis not present

## 2021-12-16 DIAGNOSIS — E7800 Pure hypercholesterolemia, unspecified: Secondary | ICD-10-CM | POA: Diagnosis not present

## 2021-12-16 DIAGNOSIS — Z7902 Long term (current) use of antithrombotics/antiplatelets: Secondary | ICD-10-CM | POA: Diagnosis not present

## 2021-12-16 DIAGNOSIS — R531 Weakness: Secondary | ICD-10-CM | POA: Diagnosis not present

## 2021-12-16 DIAGNOSIS — R2689 Other abnormalities of gait and mobility: Secondary | ICD-10-CM | POA: Diagnosis not present

## 2021-12-16 DIAGNOSIS — R0789 Other chest pain: Secondary | ICD-10-CM | POA: Diagnosis not present

## 2021-12-16 DIAGNOSIS — J189 Pneumonia, unspecified organism: Secondary | ICD-10-CM | POA: Diagnosis not present

## 2021-12-16 DIAGNOSIS — R41841 Cognitive communication deficit: Secondary | ICD-10-CM | POA: Diagnosis not present

## 2021-12-16 DIAGNOSIS — R2681 Unsteadiness on feet: Secondary | ICD-10-CM | POA: Diagnosis not present

## 2021-12-16 DIAGNOSIS — J449 Chronic obstructive pulmonary disease, unspecified: Secondary | ICD-10-CM | POA: Diagnosis not present

## 2021-12-16 DIAGNOSIS — Z87440 Personal history of urinary (tract) infections: Secondary | ICD-10-CM | POA: Diagnosis not present

## 2021-12-16 DIAGNOSIS — E785 Hyperlipidemia, unspecified: Secondary | ICD-10-CM | POA: Diagnosis not present

## 2021-12-16 DIAGNOSIS — R5381 Other malaise: Secondary | ICD-10-CM | POA: Diagnosis not present

## 2021-12-16 DIAGNOSIS — R279 Unspecified lack of coordination: Secondary | ICD-10-CM | POA: Diagnosis not present

## 2021-12-16 DIAGNOSIS — Z8701 Personal history of pneumonia (recurrent): Secondary | ICD-10-CM | POA: Diagnosis not present

## 2021-12-16 DIAGNOSIS — E119 Type 2 diabetes mellitus without complications: Secondary | ICD-10-CM | POA: Diagnosis not present

## 2021-12-16 DIAGNOSIS — N390 Urinary tract infection, site not specified: Secondary | ICD-10-CM | POA: Diagnosis not present

## 2021-12-16 DIAGNOSIS — R001 Bradycardia, unspecified: Secondary | ICD-10-CM | POA: Diagnosis not present

## 2021-12-16 DIAGNOSIS — Z7401 Bed confinement status: Secondary | ICD-10-CM | POA: Diagnosis not present

## 2021-12-16 DIAGNOSIS — F0390 Unspecified dementia without behavioral disturbance: Secondary | ICD-10-CM | POA: Diagnosis not present

## 2021-12-16 DIAGNOSIS — N184 Chronic kidney disease, stage 4 (severe): Secondary | ICD-10-CM | POA: Diagnosis not present

## 2021-12-19 DIAGNOSIS — R5381 Other malaise: Secondary | ICD-10-CM | POA: Diagnosis not present

## 2021-12-19 DIAGNOSIS — E1169 Type 2 diabetes mellitus with other specified complication: Secondary | ICD-10-CM | POA: Diagnosis not present

## 2021-12-19 DIAGNOSIS — E785 Hyperlipidemia, unspecified: Secondary | ICD-10-CM | POA: Diagnosis not present

## 2021-12-19 DIAGNOSIS — N184 Chronic kidney disease, stage 4 (severe): Secondary | ICD-10-CM | POA: Diagnosis not present

## 2021-12-27 DIAGNOSIS — N184 Chronic kidney disease, stage 4 (severe): Secondary | ICD-10-CM | POA: Diagnosis not present

## 2021-12-27 DIAGNOSIS — G20 Parkinson's disease: Secondary | ICD-10-CM | POA: Diagnosis not present

## 2021-12-27 DIAGNOSIS — R5381 Other malaise: Secondary | ICD-10-CM | POA: Diagnosis not present

## 2021-12-27 DIAGNOSIS — F0390 Unspecified dementia without behavioral disturbance: Secondary | ICD-10-CM | POA: Diagnosis not present

## 2021-12-28 NOTE — Telephone Encounter (Signed)
?  Jenny Reichmann said to call her back to set up the appt for pt's heart monitor [phone] ?

## 2021-12-28 NOTE — Telephone Encounter (Signed)
Returned Cindy's call and LM ?

## 2021-12-28 NOTE — Telephone Encounter (Signed)
[name] returning call. She states her and Jenny Reichmann will be leaving and requests a call first thing in the morning.  ?

## 2021-12-29 ENCOUNTER — Telehealth: Payer: Self-pay | Admitting: Cardiology

## 2021-12-29 NOTE — Telephone Encounter (Signed)
Spoke with Maudie Mercury, scheduled on 05/23 at 2pm. Monitor is for bradycardia and chest pain.

## 2021-12-29 NOTE — Telephone Encounter (Signed)
No VM set up.

## 2021-12-29 NOTE — Telephone Encounter (Signed)
[name] returning [place] call. Please advise

## 2021-12-29 NOTE — Telephone Encounter (Signed)
Spoke with Maudie Mercury, schedule on 05/23 at 2 pm. Monitor is for Bradycardia and chest pain.

## 2022-01-03 ENCOUNTER — Ambulatory Visit (INDEPENDENT_AMBULATORY_CARE_PROVIDER_SITE_OTHER): Payer: Medicare HMO | Admitting: Allergy

## 2022-01-03 ENCOUNTER — Encounter: Payer: Self-pay | Admitting: Allergy

## 2022-01-03 ENCOUNTER — Ambulatory Visit (INDEPENDENT_AMBULATORY_CARE_PROVIDER_SITE_OTHER): Payer: Medicare HMO

## 2022-01-03 ENCOUNTER — Telehealth: Payer: Self-pay | Admitting: *Deleted

## 2022-01-03 ENCOUNTER — Other Ambulatory Visit: Payer: Self-pay | Admitting: Cardiology

## 2022-01-03 VITALS — BP 122/68 | HR 102 | Temp 98.3°F | Resp 22 | Ht 68.0 in | Wt 184.0 lb

## 2022-01-03 DIAGNOSIS — R001 Bradycardia, unspecified: Secondary | ICD-10-CM

## 2022-01-03 DIAGNOSIS — T7800XA Anaphylactic reaction due to unspecified food, initial encounter: Secondary | ICD-10-CM

## 2022-01-03 DIAGNOSIS — R058 Other specified cough: Secondary | ICD-10-CM

## 2022-01-03 DIAGNOSIS — R0602 Shortness of breath: Secondary | ICD-10-CM | POA: Diagnosis not present

## 2022-01-03 DIAGNOSIS — R0902 Hypoxemia: Secondary | ICD-10-CM

## 2022-01-03 MED ORDER — ALBUTEROL SULFATE HFA 108 (90 BASE) MCG/ACT IN AERS: 2.0000 | INHALATION_SPRAY | RESPIRATORY_TRACT | 2 refills | Status: AC | PRN

## 2022-01-03 MED ORDER — ALBUTEROL SULFATE (2.5 MG/3ML) 0.083% IN NEBU: 2.5000 mg | INHALATION_SOLUTION | RESPIRATORY_TRACT | 1 refills | Status: DC | PRN

## 2022-01-03 MED ORDER — BUDESONIDE 0.5 MG/2ML IN SUSP
0.5000 mg | Freq: Two times a day (BID) | RESPIRATORY_TRACT | 0 refills | Status: DC
Start: 2022-01-03 — End: 2023-12-17

## 2022-01-03 NOTE — Patient Instructions (Addendum)
-  have access to albuterol inhaler 2 puffs or albuterol 1 vial via nebulizer every 4-6 hours as needed for cough/wheeze/shortness of breath/chest tightness.  Monitor frequency of use. -nebulizer provided  -recommend trial of Pulmicort 1 vial twice a day via nebulizer to help stabilize the airway to see if this will help with shortness of breath and coughing -get a pulse ox device from pharmacy and check pulse ox periodically in the day.  Recommend oxygen levels stay above 92% at home.  If dropping below this recommend he have medical evaluation -get CXR to worsening shortness of breath -if needing oxygen at home would provide referral to pulmonology for continued care  -continue shellfish avoidance and have access to epinephrine device   Follow-up in 4-6 weeks or sooner if needed

## 2022-01-03 NOTE — Telephone Encounter (Signed)
Per Dr. Nelva Bush please refer to Pulmonology Dr. Greggory Stallion in [facility] for obtaining oxygen at home.

## 2022-01-03 NOTE — Progress Notes (Signed)
New Patient Note  RE: [name] MRN: [id number] DOB: 1941/08/07 Date of Office Visit: 01/03/2022  Primary care provider: Ginger Organ., MD  Chief Complaint: shortness of breath  History of present illness: [name] is a 81 y.o. male presenting today for evaluation of shortness of breath.  He presents today with his fiance.    He was in a rehab facility for about 2 months after being hospitalized for pneumonia and UTI treatment in March 2023 until yesterday and reports was on oxygen there.  There was concern today while at home with the home health aide that his oxygen saturation was low as it was reading 90%.  Fiance says other family members come this this practice and thus she called today for appointment.  His breathing is different today than how it was yesterday or at the facility.  She states his breathing is more shallow and he can hardly walk today without getting short of breath.  He is doing a lot of coughing that is productive.  No wheeze.  He is also more hoarse than normal.  He does have history of asthma.  He has been using a family member's albuterol inhaler.  He did take it last night and another couple puffs today.  Fiance does feel like it was helpful both times in minimizing his shortness of breath.   There is plan for him to start in-house rehab soon.    He has a reported shellfish allergy for which he has an epinephrine device.  He reports he has anaphylaxis requiring hospitalization as a teenager related to shellfish ingestion.    Review of systems: Review of Systems  Constitutional: Negative.   HENT:  Positive for voice change.   Eyes: Negative.   Respiratory:  Positive for cough and shortness of breath.   Cardiovascular: Negative.   Musculoskeletal: Negative.   Skin: Negative.   Allergic/Immunologic: Negative.   Neurological: Negative.    All other systems negative unless noted above in HPI  Past medical history: Past Medical History:   Diagnosis Date   Anxiety    Xanax as needed   Arthritis    spinal stenosis.   Asthma    Barrett esophagus    Bipolar disorder ([facility])    BPH (benign prostatic hyperplasia)    Cataract    Chronic kidney disease    since NSAID's stopped -function has improved   COPD (chronic obstructive pulmonary disease) ([facility])    Crohn's colitis ([facility])    Depression    takes Cymbalta daily   Diverticulosis 01/2007   DM (diabetes mellitus) ([facility])    takes Januvia now, metformin stopped due to diarrhea   ED (erectile dysfunction)    GERD (gastroesophageal reflux disease)    uses Tums   Glaucoma    surgery to correct   History of colon polyps    HLD (hyperlipidemia)    takes Lipitor daily   HTN (hypertension)    takes Altace and Diovan daily   Impaired hearing    "getting this checked out on Monday"   Insomnia    Internal hemorrhoids 01/2007   Joint pain    Joint swelling    Microscopic colitis    Nocturia    Obesity    Osteoarthritis    Pneumonia 3/09   Sleep apnea    uses CPAP;has been greater than 51yr since sleep study- Dr. AElsworth Soho   Past surgical history: Past Surgical History:  Procedure Laterality Date  CATARACT EXTRACTION W/ INTRAOCULAR LENS  IMPLANT, BILATERAL  2011   CHOLECYSTECTOMY  2011   COLONOSCOPY  01/01/2014   Mild patchy colitis of questionable importance. Moderate predominantly sigmoid diverticulosis. Small internal hemorrhoids. Limited examination due to quality of preparation.     COLONOSCOPY W/ BIOPSIES  2008   Looks normal, biopsy suggested a chronic microscopic colitis with rare granulomas   COLONOSCOPY W/ BIOPSIES  02/2011   diverticulosis, internal hemorrhoids, patchy chronic colitis on random bxs,    ESOPHAGOGASTRODUODENOSCOPY  07/18/2017   Barretts esophagus. Small hiatal hernia.    EYE SURGERY     for glaucoma   HERNIA REPAIR     LUMBAR LAMINECTOMY/DECOMPRESSION MICRODISCECTOMY N/A 08/04/2015   Procedure: CENTRAL DECOMPRESSION  LUMBAR LAMINECTOMY L4-L5  ;  Surgeon: Latanya Maudlin, MD;  Location: [facility];  Service: Orthopedics;  Laterality: N/A;   NOSE SURGERY  2011   PENILE PROSTHESIS IMPLANT  2012   ROTATOR CUFF REPAIR Bilateral    '12 -left, '13 -right   SHOULDER ARTHROSCOPY  08/24/2011   Procedure: ARTHROSCOPY SHOULDER;  Surgeon: Marin Shutter;  Location: [facility];  Service: Orthopedics;  Laterality: Right;  RIGHT SHOULDER ARTHROSCOPY WITH SUBACROMINAL DECOMPRESSION AND DISTAL CLAVICAL RESECTION    sleep apnea surgery  1997   TONSILLECTOMY     as a child   UMBILICAL HERNIA REPAIR  5+yrs ago    Family history:  Family History  Problem Relation Age of Onset   Esophageal cancer Paternal Grandmother    Heart failure Mother    Depression Mother    Alzheimer's disease Brother    Heart attack Father    Pneumonia Father    Colon cancer Neg Hx     Social history: Lives in a home without carpeting with gas and electric heating and central cooling.  Dogs in the home.  No concern for water damage, mildew or roaches in the home.  Retired.  No current smoking history.     Medication List: Current Outpatient Medications  Medication Sig Dispense Refill   amLODipine (NORVASC) 10 MG tablet Take 10 mg by mouth daily.     ARIPiprazole (ABILIFY) 2 MG tablet Take 2 mg by mouth daily.     atorvastatin (LIPITOR) 10 MG tablet Take 10 mg by mouth every other day.     bismuth subsalicylate (PEPTO BISMOL) 262 MG/15ML suspension Take 30 mLs by mouth 2 (two) times daily as needed for indigestion.      Calcium Carbonate Antacid (TUMS PO) Take 4 tablets by mouth 4 (four) times daily as needed (heartburn). Reported on 10/06/2015     carbidopa-levodopa (SINEMET IR) 10-100 MG tablet Take 1 tablet 3 (three) times daily by mouth.     clopidogrel (PLAVIX) 75 MG tablet Take 75 mg by mouth daily.     cyanocobalamin (,VITAMIN B-12,) 1000 MCG/ML injection Inject 1,000 mcg into the muscle every 30 (thirty) days.     divalproex (DEPAKOTE ER) 500 MG 24 hr tablet Take 500  mg by mouth 2 (two) times daily.     EPINEPHrine (EPIPEN 2-PAK) 0.3 mg/0.3 mL IJ SOAJ injection Inject 0.3 mg into the muscle as needed for anaphylaxis.     famotidine (PEPCID) 20 MG tablet TAKE 1 TABLET EVERY NIGHT AT BEDTIME 90 tablet 0   ketoconazole (NIZORAL) 2 % shampoo      levothyroxine (SYNTHROID) 25 MCG tablet      levothyroxine (SYNTHROID, LEVOTHROID) 50 MCG tablet Take 50 mcg daily before breakfast by mouth.     LORazepam (  ATIVAN) 1 MG tablet 1  qhs 30 tablet 5   Melatonin 5 MG TABS Take 20 mg by mouth.     metFORMIN (GLUCOPHAGE) 500 MG tablet Take 500 mg by mouth 2 (two) times daily with a meal.     Multiple Vitamin (MULTIVITAMIN PO) Take 1 tablet by mouth daily.     ondansetron (ZOFRAN) 4 MG tablet      pantoprazole (PROTONIX) 40 MG tablet TAKE 1 TABLET TWICE DAILY 180 tablet 0   QUEtiapine (SEROQUEL) 100 MG tablet Take 1 tablet (100 mg total) by mouth at bedtime. (Patient taking differently: Take 200 mg by mouth at bedtime.) 90 tablet 4   tamsulosin (FLOMAX) 0.4 MG CAPS capsule Take 0.4 mg by mouth daily.     topiramate (TOPAMAX) 25 MG tablet Take 25 mg by mouth 2 (two) times daily.     traMADol (ULTRAM) 50 MG tablet Take 50 mg by mouth every 6 (six) hours as needed.     traZODone (DESYREL) 50 MG tablet Take by mouth.     albuterol (VENTOLIN HFA) 108 (90 Base) MCG/ACT inhaler Inhale 2 puffs into the lungs every 4 (four) hours as needed for wheezing or shortness of breath.  (Patient not taking: Reported on 01/03/2022)     No current facility-administered medications for this visit.    Known medication allergies: Allergies  Allergen Reactions   Shellfish Allergy Anaphylaxis    Patient informed RN no problem with Betadine 08/04/2015    Coly-Mycin S [Neomycin-Colist-Hc-Thonzonium] Rash   Latex Rash    Reports facial rash from Cpap mask     Physical examination: Blood pressure 122/68, pulse (!) 102, temperature 98.3 F (36.8 C), temperature source Temporal, resp. rate (!)  22, height '5\' 8"'$  (1.727 m), weight 184 lb (83.5 kg), SpO2 98 %.  General: Alert, interactive, in no acute distress. HEENT: PERRLA, TMs pearly gray, turbinates minimally edematous without discharge, post-pharynx non erythematous. Neck: Supple without lymphadenopathy. Lungs: Mildly decreased breath sounds bilaterally without wheezing, rhonchi or rales. {no increased work of breathing. CV: Normal S1, S2 without murmurs. Abdomen: Nondistended, nontender. Skin: Warm and dry, without lesions or rashes. Extremities:  No clubbing, cyanosis or edema. Neuro:   Grossly intact.  Diagnositics/Labs:  Spirometry: FEV1: 2.04L 75%, FVC: 2.87L 78%, ratio consistent with nonbstructive pattern for age  Walk test up and down hall with his walker he maintained oxygen saturations >97% the entire time  Assessment and plan: Shortness of breath Productive cough Reported hypoxia   -have access to albuterol inhaler 2 puffs or albuterol 1 vial via nebulizer every 4-6 hours as needed for cough/wheeze/shortness of breath/chest tightness.  Monitor frequency of use. -nebulizer provided  -recommend trial of Pulmicort 1 vial twice a day via nebulizer to help stabilize the airway to see if this will help with shortness of breath and coughing -get a pulse ox device from pharmacy and check pulse ox periodically in the day.  Recommend oxygen levels stay above 92% at home.  If dropping below this recommend he have medical evaluation -get CXR to worsening shortness of breath -if needing oxygen at home would provide referral to pulmonology for continued care -he was able to walk in hallway without significant drop of oxygen.  -at this time do not feel he warrants supplemental oxygen therapy at home unless otherwise documented that he has persistent drop if O2 levels  Food allergy -continue shellfish avoidance and have access to epinephrine device   Follow-up in 4-6 weeks or sooner if needed  I appreciate the  opportunity to take part in [name]'s care. Please do not hesitate to contact me with questions.  Sincerely,   Prudy Feeler, MD Allergy/Immunology [facility] [place]

## 2022-01-05 DIAGNOSIS — R0602 Shortness of breath: Secondary | ICD-10-CM | POA: Diagnosis not present

## 2022-01-05 DIAGNOSIS — J189 Pneumonia, unspecified organism: Secondary | ICD-10-CM | POA: Diagnosis not present

## 2022-01-05 DIAGNOSIS — R059 Cough, unspecified: Secondary | ICD-10-CM | POA: Diagnosis not present

## 2022-01-06 ENCOUNTER — Telehealth: Payer: Self-pay | Admitting: Cardiology

## 2022-01-06 NOTE — Telephone Encounter (Signed)
Advised that we have never seen pt.

## 2022-01-06 NOTE — Telephone Encounter (Signed)
Carrie with [facility] states that the patients HR goes to 46 and pt will complain of intermittent shortness of breath and after deep breathing shortness of breath is resolved. Pt's heart rate ranges from 46-60. [facility] does not have any critical readings at this time noted. Denies chest pain or diaphoresis

## 2022-01-06 NOTE — Telephone Encounter (Signed)
Patient had a heart rate of 46 this morning, his BP was low at 96/58. He had generalized fatigue, and intermitted SOB.  Unchanged from his baseline 2 days ago.  He is wearing his guardian mini monitor.

## 2022-01-08 ENCOUNTER — Telehealth: Payer: Self-pay | Admitting: Internal Medicine

## 2022-01-08 NOTE — Telephone Encounter (Signed)
Received a call from [employer] that patient triggered "dizziness and syncope". EKG showed sinus bradycardia of 53. Tried reaching the patient but call was not picked. Advise would be to come to the ED for evaluation.

## 2022-01-10 ENCOUNTER — Telehealth: Payer: Self-pay | Admitting: *Deleted

## 2022-01-10 DIAGNOSIS — R001 Bradycardia, unspecified: Secondary | ICD-10-CM | POA: Diagnosis not present

## 2022-01-10 DIAGNOSIS — G3109 Other frontotemporal dementia: Secondary | ICD-10-CM | POA: Diagnosis not present

## 2022-01-10 DIAGNOSIS — N289 Disorder of kidney and ureter, unspecified: Secondary | ICD-10-CM | POA: Diagnosis not present

## 2022-01-10 DIAGNOSIS — R0602 Shortness of breath: Secondary | ICD-10-CM | POA: Diagnosis not present

## 2022-01-10 DIAGNOSIS — I129 Hypertensive chronic kidney disease with stage 1 through stage 4 chronic kidney disease, or unspecified chronic kidney disease: Secondary | ICD-10-CM | POA: Diagnosis not present

## 2022-01-10 DIAGNOSIS — J45909 Unspecified asthma, uncomplicated: Secondary | ICD-10-CM | POA: Diagnosis not present

## 2022-01-10 DIAGNOSIS — N1831 Chronic kidney disease, stage 3a: Secondary | ICD-10-CM | POA: Diagnosis not present

## 2022-01-10 DIAGNOSIS — E1129 Type 2 diabetes mellitus with other diabetic kidney complication: Secondary | ICD-10-CM | POA: Diagnosis not present

## 2022-01-10 DIAGNOSIS — G4733 Obstructive sleep apnea (adult) (pediatric): Secondary | ICD-10-CM | POA: Diagnosis not present

## 2022-01-10 NOTE — Telephone Encounter (Signed)
Spoke with [name] [facility]) and asked if monitor has been off. She stated phone is not working, not charged even though they have been charging it every night. I asked them to please call the company and get their help on this matter. She stated that she would.

## 2022-01-12 NOTE — Telephone Encounter (Signed)
Referral was placed to Elijio Miles for review as their office doesn't allow the office to schedule for the patient.   I contacted the patient and spoke with his Fiance. She states his PCP placed a referral to [facility] and they do not want to move forward with the referral to [place].   Referral cancelled via fax.

## 2022-01-18 ENCOUNTER — Encounter: Payer: Self-pay | Admitting: Allergy

## 2022-01-18 DIAGNOSIS — J45909 Unspecified asthma, uncomplicated: Secondary | ICD-10-CM | POA: Diagnosis not present

## 2022-01-18 DIAGNOSIS — R0602 Shortness of breath: Secondary | ICD-10-CM | POA: Diagnosis not present

## 2022-01-19 ENCOUNTER — Other Ambulatory Visit: Payer: Self-pay | Admitting: Allergy

## 2022-01-19 ENCOUNTER — Institutional Professional Consult (permissible substitution): Payer: Medicare HMO | Admitting: Pulmonary Disease

## 2022-01-27 ENCOUNTER — Other Ambulatory Visit: Payer: Self-pay

## 2022-01-27 DIAGNOSIS — Z515 Encounter for palliative care: Secondary | ICD-10-CM

## 2022-01-27 NOTE — Progress Notes (Signed)
COMMUNITY PALLIATIVE CARE SW NOTE  PATIENT NAME: [name] DOB: 27-Jan-1941 MRN: [id number]  PRIMARY CARE PROVIDER: Ginger Organ., MD  RESPONSIBLE PARTY:  Acct ID - Guarantor Home Phone Work Phone Relationship Acct Type  [id number] [name], [name]  Self P/F     [address] [place], [place] [address]   SOCIAL WORK TELEPHONIC ENCOUNTER  PC SW completed telephonic encounter with patient's significant other-Loretta to obtain a status update on patient and provide support. [name] stated that patient continues to sleep most of the day, only arousing for meals and therapy. His appetite is poor, but he is drinking 2-3 Ensure a day. She report that patient have no weight loss. He has not had any falls or skin issues. Patient is currently receiving home health services through [facility]. He receives RN visits 2x weeks and PT 2x week. SW offered an NP follow-up visit, which she declined. [name] asked that the team follow-up in 6-8 weeks. No other concerns noted.   [address] [place], [place]

## 2022-01-31 DIAGNOSIS — Z0000 Encounter for general adult medical examination without abnormal findings: Secondary | ICD-10-CM | POA: Insufficient documentation

## 2022-01-31 DIAGNOSIS — R410 Disorientation, unspecified: Secondary | ICD-10-CM | POA: Insufficient documentation

## 2022-01-31 DIAGNOSIS — E538 Deficiency of other specified B group vitamins: Secondary | ICD-10-CM | POA: Insufficient documentation

## 2022-01-31 DIAGNOSIS — R296 Repeated falls: Secondary | ICD-10-CM | POA: Insufficient documentation

## 2022-02-03 ENCOUNTER — Ambulatory Visit (INDEPENDENT_AMBULATORY_CARE_PROVIDER_SITE_OTHER): Payer: Medicare HMO | Admitting: Cardiology

## 2022-02-03 ENCOUNTER — Encounter: Payer: Self-pay | Admitting: Cardiology

## 2022-02-03 VITALS — BP 118/60 | HR 80 | Wt 183.2 lb

## 2022-02-03 DIAGNOSIS — I480 Paroxysmal atrial fibrillation: Secondary | ICD-10-CM | POA: Diagnosis not present

## 2022-02-03 DIAGNOSIS — E1121 Type 2 diabetes mellitus with diabetic nephropathy: Secondary | ICD-10-CM | POA: Diagnosis not present

## 2022-02-03 DIAGNOSIS — R0609 Other forms of dyspnea: Secondary | ICD-10-CM | POA: Diagnosis not present

## 2022-02-03 DIAGNOSIS — R001 Bradycardia, unspecified: Secondary | ICD-10-CM

## 2022-02-03 DIAGNOSIS — I10 Essential (primary) hypertension: Secondary | ICD-10-CM

## 2022-02-03 NOTE — Progress Notes (Signed)
Cardiology Office Note:    Date:  02/03/2022   ID:  [name], DOB Dec 20, 1940, MRN [id number]  PCP:  Cleatis Polka., MD  Cardiologist:  Gypsy Balsam, MD    Referring MD: Cleatis Polka., MD   No chief complaint on file.   History of Present Illness:    [name] is a 81 y.o. male with past medical history significant for Parkinson, essential hypertension, diabetes, dyslipidemia, dementia, functional disability.  In May he was in the hospital because of some atypical chest pain, he was also noted to be in sinus bradycardia.  After that we will put monitor on him to see if there is any significance of this bradycardia, monitor surprisingly revealed evidence of atrial fibrillation with fairly controlled ventricular rate.  He comes today to my office to follow-up about that and discuss management of this problem.  He tells me 6 years ago he had a mini stroke, he does have hypertension diabetes his age his CHADS2 Vascor equals 5 he needs to be anticoagulated.  He described to have some palpitations but no dizziness.  He lives a very sedentary lifestyle he basically sits in the chair all the time.  However his wife tells me that he did have some falls before but now since he is very immobile he does not have much falls.  Past Medical History:  Diagnosis Date   Anxiety    Xanax as needed   Arthritis    spinal stenosis.   Asthma    Barrett esophagus    Bipolar disorder (HCC)    BPH (benign prostatic hyperplasia)    Cataract    Chronic kidney disease    since NSAID's stopped -function has improved   COPD (chronic obstructive pulmonary disease) (HCC)    Crohn's colitis (HCC)    Depression    takes Cymbalta daily   Diverticulosis 01/2007   DM (diabetes mellitus) (HCC)    takes Januvia now, metformin stopped due to diarrhea   ED (erectile dysfunction)    GERD (gastroesophageal reflux disease)    uses Tums   Glaucoma    surgery to correct   History of colon polyps     HLD (hyperlipidemia)    takes Lipitor daily   HTN (hypertension)    takes Altace and Diovan daily   Impaired hearing    "getting this checked out on Monday"   Insomnia    Internal hemorrhoids 01/2007   Joint pain    Joint swelling    Microscopic colitis    Nocturia    Obesity    Osteoarthritis    Pneumonia 3/09   Sleep apnea    uses CPAP;has been greater than 57yrs since sleep study- Dr. Vassie Loll    Past Surgical History:  Procedure Laterality Date   CATARACT EXTRACTION W/ INTRAOCULAR LENS  IMPLANT, BILATERAL  2011   CHOLECYSTECTOMY  2011   COLONOSCOPY  01/01/2014   Mild patchy colitis of questionable importance. Moderate predominantly sigmoid diverticulosis. Small internal hemorrhoids. Limited examination due to quality of preparation.     COLONOSCOPY W/ BIOPSIES  2008   Looks normal, biopsy suggested a chronic microscopic colitis with rare granulomas   COLONOSCOPY W/ BIOPSIES  02/2011   diverticulosis, internal hemorrhoids, patchy chronic colitis on random bxs,    ESOPHAGOGASTRODUODENOSCOPY  07/18/2017   Barretts esophagus. Small hiatal hernia.    EYE SURGERY     for glaucoma   HERNIA REPAIR     LUMBAR LAMINECTOMY/DECOMPRESSION MICRODISCECTOMY N/A  08/04/2015   Procedure: CENTRAL DECOMPRESSION  LUMBAR LAMINECTOMY L4-L5 ;  Surgeon: Ranee Gosselin, MD;  Location: [facility];  Service: Orthopedics;  Laterality: N/A;   NOSE SURGERY  2011   PENILE PROSTHESIS IMPLANT  2012   ROTATOR CUFF REPAIR Bilateral    '12 -left, '13 -right   SHOULDER ARTHROSCOPY  08/24/2011   Procedure: ARTHROSCOPY SHOULDER;  Surgeon: Senaida Lange;  Location: [facility] OR;  Service: Orthopedics;  Laterality: Right;  RIGHT SHOULDER ARTHROSCOPY WITH SUBACROMINAL DECOMPRESSION AND DISTAL CLAVICAL RESECTION    sleep apnea surgery  1997   TONSILLECTOMY     as a child   UMBILICAL HERNIA REPAIR  5+yrs ago    Current Medications: Current Meds  Medication Sig   albuterol (PROVENTIL) (2.5 MG/3ML) 0.083% nebulizer  solution TAKE 3 MLS (2.5 MG TOTAL) BY NEBULIZATION EVERY 4 (FOUR) HOURS AS NEEDED FOR WHEEZING OR SHORTNESS OF BREATH.   albuterol (VENTOLIN HFA) 108 (90 Base) MCG/ACT inhaler Inhale 2 puffs into the lungs every 4 (four) hours as needed for wheezing or shortness of breath.   albuterol (VENTOLIN HFA) 108 (90 Base) MCG/ACT inhaler Inhale 2 puffs into the lungs every 4 (four) hours as needed for wheezing or shortness of breath.   amLODipine (NORVASC) 10 MG tablet Take 10 mg by mouth daily.   ARIPiprazole (ABILIFY) 2 MG tablet Take 2 mg by mouth daily.   atorvastatin (LIPITOR) 10 MG tablet Take 10 mg by mouth every other day.   bismuth subsalicylate (PEPTO BISMOL) 262 MG/15ML suspension Take 30 mLs by mouth 2 (two) times daily as needed for indigestion.    budesonide (PULMICORT) 0.5 MG/2ML nebulizer solution Take 2 mLs (0.5 mg total) by nebulization 2 (two) times daily.   Calcium Carbonate Antacid (TUMS PO) Take 4 tablets by mouth 4 (four) times daily as needed (heartburn). Reported on 10/06/2015   carbidopa-levodopa (SINEMET IR) 10-100 MG tablet Take 1 tablet 3 (three) times daily by mouth.   clopidogrel (PLAVIX) 75 MG tablet Take 75 mg by mouth daily.   cyanocobalamin (,VITAMIN B-12,) 1000 MCG/ML injection Inject 1,000 mcg into the muscle every 30 (thirty) days.   divalproex (DEPAKOTE ER) 500 MG 24 hr tablet Take 500 mg by mouth 2 (two) times daily.   EPINEPHrine (EPIPEN 2-PAK) 0.3 mg/0.3 mL IJ SOAJ injection Inject 0.3 mg into the muscle as needed for anaphylaxis.   famotidine (PEPCID) 20 MG tablet TAKE 1 TABLET EVERY NIGHT AT BEDTIME   ketoconazole (NIZORAL) 2 % shampoo    levothyroxine (SYNTHROID) 25 MCG tablet    levothyroxine (SYNTHROID, LEVOTHROID) 50 MCG tablet Take 50 mcg daily before breakfast by mouth.   LORazepam (ATIVAN) 1 MG tablet 1  qhs   Melatonin 5 MG TABS Take 20 mg by mouth.   metFORMIN (GLUCOPHAGE) 500 MG tablet Take 500 mg by mouth 2 (two) times daily with a meal.   Multiple  Vitamin (MULTIVITAMIN PO) Take 1 tablet by mouth daily.   ondansetron (ZOFRAN) 4 MG tablet    pantoprazole (PROTONIX) 40 MG tablet TAKE 1 TABLET TWICE DAILY   QUEtiapine (SEROQUEL) 100 MG tablet Take 1 tablet (100 mg total) by mouth at bedtime. (Patient taking differently: Take 200 mg by mouth at bedtime.)   tamsulosin (FLOMAX) 0.4 MG CAPS capsule Take 0.4 mg by mouth daily.   topiramate (TOPAMAX) 25 MG tablet Take 25 mg by mouth 2 (two) times daily.   traMADol (ULTRAM) 50 MG tablet Take 50 mg by mouth every 6 (six) hours as needed.  traZODone (DESYREL) 50 MG tablet Take by mouth.     Allergies:   Shellfish allergy, Coly-mycin s [neomycin-colist-hc-thonzonium], and Latex   Social History   Socioeconomic History   Marital status: Widowed    Spouse name: Not on file   Number of children: Not on file   Years of education: Not on file   Highest education level: Not on file  Occupational History   Occupation: retired    Comment: Arts development officer  Tobacco Use   Smoking status: Never    Passive exposure: Never   Smokeless tobacco: Never  Vaping Use   Vaping Use: Never used  Substance and Sexual Activity   Alcohol use: No   Drug use: No   Sexual activity: Not Currently  Other Topics Concern   Not on file  Social History Narrative   Retired Environmental consultant, 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 There is no history of Colon cancer. ROS:   Please see the history of present  illness.    All 14 point review of systems negative except as described per history of present illness  EKGs/Labs/Other Studies Reviewed:      Recent Labs: No results found for requested labs within last 365 days.  Recent Lipid Panel No results found for: "CHOL", "TRIG", "HDL", "CHOLHDL", "VLDL", "LDLCALC", "LDLDIRECT"  Physical Exam:    VS:  BP 118/60 (BP Location: Left Arm, Patient Position: Sitting)   Pulse 80   Wt 183 lb 3.2 oz (83.1 kg)   SpO2 95%   BMI 27.86 kg/m     Wt Readings from Last 3 Encounters:  02/03/22 183 lb 3.2 oz (83.1 kg)  01/03/22 184 lb (83.5 kg)  07/14/19 173 lb (78.5 kg)     GEN:  Well nourished, well developed in no acute distress HEENT: Normal NECK: No JVD; No carotid bruits LYMPHATICS: No lymphadenopathy CARDIAC: RRR, no murmurs, no rubs, no gallops RESPIRATORY:  Clear to auscultation without rales, wheezing or rhonchi  ABDOMEN: Soft, non-tender, non-distended MUSCULOSKELETAL:  No edema; No deformity  SKIN: Warm and dry LOWER EXTREMITIES: no swelling NEUROLOGIC:  Alert and oriented x 3 PSYCHIATRIC:  Normal affect   ASSESSMENT:    1. Essential hypertension   2. Paroxysmal atrial fibrillation (HCC)   3. Diabetic nephropathy associated with type 2 diabetes mellitus (HCC)   4. Bradycardia    PLAN:    In order of problems listed above:  Paroxysmal atrial fibrillation is a new discovery new diagnosis.  CHADS2 Vascor equals 5 need to be anticoagulated.  I will give him samples of Eliquis 5 mg twice daily, however asked him not to take it until I check his CBC Chem-7 as well as stool for guaiac when we find dose being normal then Eliquis will be started on Plavix will be discontinued.  I will schedule him to have an echocardiogram to assess left ventricle ejection fraction as well as actual size and will meet again to talk about potential antiarrhythmic therapy but I will  also wait for results of the monitor to see the burden of atrial  fibrillation. Bradycardia denies have any chest pain tightness squeezing pressure burning chest no dizziness no passing out. Essential hypertension blood pressure seems to well controlled continue present management. Parkinson: Noted   Medication Adjustments/Labs and Tests Ordered: Current medicines are reviewed at length with the patient today.  Concerns regarding medicines are outlined above.  No orders of the defined types were placed in this encounter.  Medication changes: No orders of the defined types were placed in this encounter.   Signed, Benjamin Lea, MD, [facility] 02/03/2022 1:21 PM    [facility]

## 2022-02-04 LAB — CBC
Hematocrit: 44.4 % (ref 37.5–51.0)
Hemoglobin: 14.2 g/dL (ref 13.0–17.7)
MCH: 28 pg (ref 26.6–33.0)
MCHC: 32 g/dL (ref 31.5–35.7)
MCV: 87 fL (ref 79–97)
Platelets: 228 10*3/uL (ref 150–450)
RBC: 5.08 x10E6/uL (ref 4.14–5.80)
RDW: 15.4 % (ref 11.6–15.4)
WBC: 6.8 10*3/uL (ref 3.4–10.8)

## 2022-02-08 DIAGNOSIS — E1121 Type 2 diabetes mellitus with diabetic nephropathy: Secondary | ICD-10-CM | POA: Diagnosis not present

## 2022-02-08 DIAGNOSIS — R0609 Other forms of dyspnea: Secondary | ICD-10-CM | POA: Diagnosis not present

## 2022-02-08 DIAGNOSIS — R001 Bradycardia, unspecified: Secondary | ICD-10-CM | POA: Diagnosis not present

## 2022-02-08 DIAGNOSIS — I10 Essential (primary) hypertension: Secondary | ICD-10-CM | POA: Diagnosis not present

## 2022-02-08 DIAGNOSIS — I480 Paroxysmal atrial fibrillation: Secondary | ICD-10-CM | POA: Diagnosis not present

## 2022-02-09 ENCOUNTER — Ambulatory Visit (INDEPENDENT_AMBULATORY_CARE_PROVIDER_SITE_OTHER): Payer: Medicare HMO

## 2022-02-09 DIAGNOSIS — R0609 Other forms of dyspnea: Secondary | ICD-10-CM | POA: Diagnosis not present

## 2022-02-09 DIAGNOSIS — I10 Essential (primary) hypertension: Secondary | ICD-10-CM | POA: Diagnosis not present

## 2022-02-09 DIAGNOSIS — R001 Bradycardia, unspecified: Secondary | ICD-10-CM

## 2022-02-09 DIAGNOSIS — I480 Paroxysmal atrial fibrillation: Secondary | ICD-10-CM | POA: Diagnosis not present

## 2022-02-09 DIAGNOSIS — E1121 Type 2 diabetes mellitus with diabetic nephropathy: Secondary | ICD-10-CM | POA: Diagnosis not present

## 2022-02-09 LAB — ECHOCARDIOGRAM COMPLETE
Area-P 1/2: 3.77 cm2
P 1/2 time: 418 msec
S' Lateral: 3.2 cm

## 2022-02-09 LAB — FECAL OCCULT BLOOD, IMMUNOCHEMICAL: Fecal Occult Bld: NEGATIVE

## 2022-02-10 ENCOUNTER — Telehealth: Payer: Self-pay | Admitting: Cardiology

## 2022-02-10 NOTE — Telephone Encounter (Signed)
Returning nurse call.

## 2022-02-10 NOTE — Telephone Encounter (Signed)
[name] - family member informed of result (per DPR)

## 2022-02-15 ENCOUNTER — Telehealth: Payer: Self-pay

## 2022-02-15 NOTE — Telephone Encounter (Signed)
Results reviewed with pt's fiance [name]- per DPR as per Dr. Wendy Poet note.  Pt's fiance verbalized understanding and had no additional questions. Routed to PCP

## 2022-02-16 ENCOUNTER — Telehealth: Payer: Self-pay | Admitting: Cardiology

## 2022-02-16 MED ORDER — APIXABAN 5 MG PO TABS: 5.0000 mg | ORAL_TABLET | Freq: Two times a day (BID) | ORAL | 3 refills | Status: DC

## 2022-02-16 NOTE — Telephone Encounter (Signed)
Pt c/o medication issue:  1. Name of Medication: Celebrax  2. How are you currently taking this medication (dosage and times per day)?   3. Are you having a reaction (difficulty breathing--STAT)?    4. What is your medication issue?  Is he supposed to be taking Celebrax

## 2022-02-16 NOTE — Telephone Encounter (Signed)
Spoke with fiance Loretta. Dr. Agustin Cree stated that he prefers that pt no take Celebrex with Plavix and Eliquis. Pt's fiance verbalized understanding and had no further questions or concerns.

## 2022-02-21 ENCOUNTER — Telehealth: Payer: Self-pay | Admitting: Cardiology

## 2022-02-21 DIAGNOSIS — I480 Paroxysmal atrial fibrillation: Secondary | ICD-10-CM

## 2022-02-21 MED ORDER — APIXABAN 5 MG PO TABS: 5.0000 mg | ORAL_TABLET | Freq: Two times a day (BID) | ORAL | 1 refills | Status: DC

## 2022-02-21 NOTE — Telephone Encounter (Signed)
Medication sent to requested pharmacy.

## 2022-02-21 NOTE — Telephone Encounter (Signed)
 *  STAT* If patient is at the pharmacy, call can be transferred to refill team.   1. Which medications need to be refilled? (please list name of each medication and dose if known) apixaban (ELIQUIS) 5 MG TABS tablet  2. Which pharmacy/location (including street and city if local pharmacy) is medication to be sent to? [place], [place]  3. Do they need a 30 day or 90 day supply? 90 days  Refill was sent to [facility] but it needs to send to [place]

## 2022-03-07 ENCOUNTER — Ambulatory Visit: Payer: Medicare HMO | Admitting: Podiatrist

## 2022-03-07 DIAGNOSIS — B351 Tinea unguium: Secondary | ICD-10-CM | POA: Diagnosis not present

## 2022-03-07 DIAGNOSIS — M79609 Pain in unspecified limb: Secondary | ICD-10-CM | POA: Diagnosis not present

## 2022-03-07 NOTE — Progress Notes (Signed)
Subjective: [name] is a 81 y.o. male patient who presents to office today with a concern of long,mildly painful nails  while ambulating in shoes; unable to trim.  The left third toe is long and he is concerned it might fall off on its own. He is diabetic with recent HgA1C of 5.9.  he is on Eliquis.   Patient Active Problem List   Diagnosis Date Noted   Paroxysmal atrial fibrillation ([place]) 02/03/2022   B12 deficiency 01/31/2022   Delirium 01/31/2022   Preventative health care 01/31/2022   Recurrent falls 01/31/2022   Chronic bilateral low back pain with bilateral sciatica 06/08/2020   Hypothyroidism 03/20/2019   Impaired functional mobility, balance, gait, and endurance 02/07/2019   Primary osteoarthritis of left hip 10/25/2016   Lumbar radiculopathy 10/19/2016   Abnormal gait 09/04/2016   Dementia ([place]) 05/25/2016   Secondary parkinsonism ([place]) 10/08/2015   Spinal stenosis, lumbar region, with neurogenic claudication 08/04/2015   Calcium blood increased 02/18/2015   Bradycardia 01/26/2015   Chronic kidney disease (CKD), stage III (moderate) ([place]) 01/26/2015   Diabetes mellitus ([place]) 01/26/2015   Benign prostatic hypertrophy without urinary obstruction 01/25/2015   Obstructive apnea 01/25/2015   Type 2 diabetes mellitus ([place]) 01/25/2015   Bipolar I disorder ([place]) 01/24/2015   Has a tremor 01/13/2015   Frontotemporal dementia ([place]) 12/03/2014   Insomnia 11/11/2014   Gastro-esophageal reflux disease without esophagitis 06/15/2011   ED (erectile dysfunction) of organic origin 03/19/2011   Diabetic renal disease ([place]) 02/16/2010   Recurrent major depression ([place]) 02/16/2010   Microscopic colitis 01/15/2008    Class: Question of   DIABETES MELLITUS 10/23/2007   HYPERLIPIDEMIA 10/23/2007   OBESITY 10/23/2007   ANXIETY 10/23/2007   DEPRESSION 10/23/2007   Essential hypertension 10/23/2007   Asthma 10/23/2007   Sleep apnea 10/23/2007   Adiposity 10/23/2007   Anxiety state  10/23/2007   Current Outpatient Medications on File Prior to Visit  Medication Sig Dispense Refill   albuterol (PROVENTIL) (2.5 MG/3ML) 0.083% nebulizer solution TAKE 3 MLS (2.5 MG TOTAL) BY NEBULIZATION EVERY 4 (FOUR) HOURS AS NEEDED FOR WHEEZING OR SHORTNESS OF BREATH. 75 mL 1   albuterol (VENTOLIN HFA) 108 (90 Base) MCG/ACT inhaler Inhale 2 puffs into the lungs every 4 (four) hours as needed for wheezing or shortness of breath.     albuterol (VENTOLIN HFA) 108 (90 Base) MCG/ACT inhaler Inhale 2 puffs into the lungs every 4 (four) hours as needed for wheezing or shortness of breath. 18 g 2   amLODipine (NORVASC) 10 MG tablet Take 10 mg by mouth daily.     apixaban (ELIQUIS) 5 MG TABS tablet Take 1 tablet (5 mg total) by mouth 2 (two) times daily. 180 tablet 1   ARIPiprazole (ABILIFY) 2 MG tablet Take 2 mg by mouth daily.     atorvastatin (LIPITOR) 10 MG tablet Take 10 mg by mouth every other day.     bismuth subsalicylate (PEPTO BISMOL) 262 MG/15ML suspension Take 30 mLs by mouth 2 (two) times daily as needed for indigestion.      budesonide (PULMICORT) 0.5 MG/2ML nebulizer solution Take 2 mLs (0.5 mg total) by nebulization 2 (two) times daily. 180 mL 0   Calcium Carbonate Antacid (TUMS PO) Take 4 tablets by mouth 4 (four) times daily as needed (heartburn). Reported on 10/06/2015     carbidopa-levodopa (SINEMET IR) 10-100 MG tablet Take 1 tablet 3 (three) times daily by mouth.     clopidogrel (PLAVIX) 75 MG tablet Take 75  mg by mouth daily.     cyanocobalamin (,VITAMIN B-12,) 1000 MCG/ML injection Inject 1,000 mcg into the muscle every 30 (thirty) days.     divalproex (DEPAKOTE ER) 500 MG 24 hr tablet Take 500 mg by mouth 2 (two) times daily.     EPINEPHrine (EPIPEN 2-PAK) 0.3 mg/0.3 mL IJ SOAJ injection Inject 0.3 mg into the muscle as needed for anaphylaxis.     famotidine (PEPCID) 20 MG tablet TAKE 1 TABLET EVERY NIGHT AT BEDTIME 90 tablet 0   ketoconazole (NIZORAL) 2 % shampoo       levothyroxine (SYNTHROID) 25 MCG tablet      levothyroxine (SYNTHROID, LEVOTHROID) 50 MCG tablet Take 50 mcg daily before breakfast by mouth.     LORazepam (ATIVAN) 1 MG tablet 1  qhs 30 tablet 5   Melatonin 5 MG TABS Take 20 mg by mouth.     metFORMIN (GLUCOPHAGE) 500 MG tablet Take 500 mg by mouth 2 (two) times daily with a meal.     Multiple Vitamin (MULTIVITAMIN PO) Take 1 tablet by mouth daily.     ondansetron (ZOFRAN) 4 MG tablet      pantoprazole (PROTONIX) 40 MG tablet TAKE 1 TABLET TWICE DAILY 180 tablet 0   QUEtiapine (SEROQUEL) 100 MG tablet Take 1 tablet (100 mg total) by mouth at bedtime. (Patient taking differently: Take 200 mg by mouth at bedtime.) 90 tablet 4   tamsulosin (FLOMAX) 0.4 MG CAPS capsule Take 0.4 mg by mouth daily.     topiramate (TOPAMAX) 25 MG tablet Take 25 mg by mouth 2 (two) times daily.     traMADol (ULTRAM) 50 MG tablet Take 50 mg by mouth every 6 (six) hours as needed.     traZODone (DESYREL) 50 MG tablet Take by mouth.     No current facility-administered medications on file prior to visit.   Allergies  Allergen Reactions   Shellfish Allergy Anaphylaxis    Patient informed RN no problem with Betadine 08/04/2015    Coly-Mycin S [Neomycin-Colist-Hc-Thonzonium] Rash   Latex Rash    Reports facial rash from Cpap mask     Objective: General: Patient is awake, alert, and oriented x 3 and in no acute distress.  Integument: Skin is warm, dry and supple bilateral. Nails are tender, long, thickened and  dystrophic with subungual debris, consistent with onychomycosis, 2-5 Right and 2,3,5 Left. No signs of infection. No open lesions or preulcerative lesions present bilateral. Remaining integument unremarkable.  Vasculature:  Dorsalis Pedis pulse 2/4 bilateral. Posterior Tibial pulse 1 /4 bilateral.  Capillary fill time <3 sec 1-5 bilateral. Positive hair growth to the level of the digits. Temperature gradient within normal limits. No varicosities present  bilateral. No edema present bilateral.   Neurology: The patient has intact sensation measured with a 5.07/10g Semmes Weinstein Monofilament at all pedal sites bilateral . Vibratory sensation diminished bilateral with tuning fork. No Babinski sign present bilateral.   Musculoskeletal: No symptomatic pedal deformities noted bilateral. Muscular strength 5/5 in all lower extremity muscular groups bilateral without pain on range of motion . No tenderness with calf compression bilateral.  Assessment and Plan:   ICD-10-CM   1. Pain due to onychomycosis of nail  B35.1    M79.609        -Examined patient. -Mechanically debrided symptomatic toenails bilateral using sterile nail nipper and filed with dremel without incident  -Answered all patient questions -Patient to return  in 3-4 months for continued foot care.  -Patient advised to call  the office if any problems or questions arise in the meantime.  Bronson Ing, DPM

## 2022-03-12 ENCOUNTER — Encounter: Payer: Self-pay | Admitting: Podiatrist

## 2022-03-29 DIAGNOSIS — M25661 Stiffness of right knee, not elsewhere classified: Secondary | ICD-10-CM | POA: Diagnosis not present

## 2022-03-29 DIAGNOSIS — R293 Abnormal posture: Secondary | ICD-10-CM | POA: Diagnosis not present

## 2022-03-29 DIAGNOSIS — R269 Unspecified abnormalities of gait and mobility: Secondary | ICD-10-CM | POA: Diagnosis not present

## 2022-03-29 DIAGNOSIS — M6281 Muscle weakness (generalized): Secondary | ICD-10-CM | POA: Diagnosis not present

## 2022-03-29 DIAGNOSIS — M2560 Stiffness of unspecified joint, not elsewhere classified: Secondary | ICD-10-CM | POA: Diagnosis not present

## 2022-03-29 DIAGNOSIS — M25662 Stiffness of left knee, not elsewhere classified: Secondary | ICD-10-CM | POA: Diagnosis not present

## 2022-04-06 DIAGNOSIS — M25661 Stiffness of right knee, not elsewhere classified: Secondary | ICD-10-CM | POA: Diagnosis not present

## 2022-04-06 DIAGNOSIS — R293 Abnormal posture: Secondary | ICD-10-CM | POA: Diagnosis not present

## 2022-04-06 DIAGNOSIS — M6281 Muscle weakness (generalized): Secondary | ICD-10-CM | POA: Diagnosis not present

## 2022-04-06 DIAGNOSIS — M25662 Stiffness of left knee, not elsewhere classified: Secondary | ICD-10-CM | POA: Diagnosis not present

## 2022-04-06 DIAGNOSIS — M2560 Stiffness of unspecified joint, not elsewhere classified: Secondary | ICD-10-CM | POA: Diagnosis not present

## 2022-04-06 DIAGNOSIS — R269 Unspecified abnormalities of gait and mobility: Secondary | ICD-10-CM | POA: Diagnosis not present

## 2022-04-07 ENCOUNTER — Ambulatory Visit (INDEPENDENT_AMBULATORY_CARE_PROVIDER_SITE_OTHER): Payer: Medicare HMO | Admitting: Cardiology

## 2022-04-07 ENCOUNTER — Encounter: Payer: Self-pay | Admitting: Cardiology

## 2022-04-07 VITALS — BP 122/60 | HR 57 | Ht 68.0 in | Wt 188.4 lb

## 2022-04-07 DIAGNOSIS — G219 Secondary parkinsonism, unspecified: Secondary | ICD-10-CM

## 2022-04-07 DIAGNOSIS — I10 Essential (primary) hypertension: Secondary | ICD-10-CM

## 2022-04-07 DIAGNOSIS — I480 Paroxysmal atrial fibrillation: Secondary | ICD-10-CM

## 2022-04-07 NOTE — Progress Notes (Signed)
Cardiology Office Note:    Date:  04/07/2022   ID:  [name], DOB 1940/11/12, MRN [id number]  PCP:  Ginger Organ., MD  Cardiologist:  Jenne Campus, MD    Referring MD: Ginger Organ., MD   Chief Complaint  Patient presents with   Follow-up  Doing well  History of Present Illness:    [name] is a 81 y.o. male with past medical history significant for Parkinson, essential hypertension, diabetes, dyslipidemia, functional disability, he did wear a monitor because of bradycardia surprisingly what we discover is the fact that he had an episode of atrial fibrillation.  Rate was controlled we initiated anticoagulation since his CHADS2 Vascor equals 5.  He is coming to my office today for follow-up.  Overall doing well.  He denies have any chest pain, tightness, pressure, burning in the chest.  Past Medical History:  Diagnosis Date   Anxiety    Xanax as needed   Arthritis    spinal stenosis.   Asthma    Barrett esophagus    Bipolar disorder (HCC)    BPH (benign prostatic hyperplasia)    Cataract    Chronic kidney disease    since NSAID's stopped -function has improved   COPD (chronic obstructive pulmonary disease) (HCC)    Crohn's colitis ([place])    Depression    takes Cymbalta daily   Diverticulosis 01/2007   DM (diabetes mellitus) ([place])    takes Januvia now, metformin stopped due to diarrhea   ED (erectile dysfunction)    GERD (gastroesophageal reflux disease)    uses Tums   Glaucoma    surgery to correct   History of colon polyps    HLD (hyperlipidemia)    takes Lipitor daily   HTN (hypertension)    takes Altace and Diovan daily   Impaired hearing    "getting this checked out on Monday"   Insomnia    Internal hemorrhoids 01/2007   Joint pain    Joint swelling    Microscopic colitis    Nocturia    Obesity    Osteoarthritis    Pneumonia 3/09   Sleep apnea    uses CPAP;has been greater than 80yr since sleep study- Dr. AElsworth Soho   Past  Surgical History:  Procedure Laterality Date   CATARACT EXTRACTION W/ INTRAOCULAR LENS  IMPLANT, BILATERAL  2011   CHOLECYSTECTOMY  2011   COLONOSCOPY  01/01/2014   Mild patchy colitis of questionable importance. Moderate predominantly sigmoid diverticulosis. Small internal hemorrhoids. Limited examination due to quality of preparation.     COLONOSCOPY W/ BIOPSIES  2008   Looks normal, biopsy suggested a chronic microscopic colitis with rare granulomas   COLONOSCOPY W/ BIOPSIES  02/2011   diverticulosis, internal hemorrhoids, patchy chronic colitis on random bxs,    ESOPHAGOGASTRODUODENOSCOPY  07/18/2017   Barretts esophagus. Small hiatal hernia.    EYE SURGERY     for glaucoma   HERNIA REPAIR     LUMBAR LAMINECTOMY/DECOMPRESSION MICRODISCECTOMY N/A 08/04/2015   Procedure: CENTRAL DECOMPRESSION  LUMBAR LAMINECTOMY L4-L5 ;  Surgeon: RLatanya Maudlin MD;  Location: [facility];  Service: Orthopedics;  Laterality: N/A;   NOSE SURGERY  2011   PENILE PROSTHESIS IMPLANT  2012   ROTATOR CUFF REPAIR Bilateral    '12 -left, '13 -right   SHOULDER ARTHROSCOPY  08/24/2011   Procedure: ARTHROSCOPY SHOULDER;  Surgeon: KMarin Shutter  [facility]  Service: Orthopedics;  Laterality: Right;  RIGHT SHOULDER ARTHROSCOPY WITH SUBACROMINAL  DECOMPRESSION AND DISTAL CLAVICAL RESECTION    sleep apnea surgery  1997   TONSILLECTOMY     as a child   UMBILICAL HERNIA REPAIR  5+yrs ago    Current Medications: Current Meds  Medication Sig   albuterol (PROVENTIL) (2.5 MG/3ML) 0.083% nebulizer solution TAKE 3 MLS (2.5 MG TOTAL) BY NEBULIZATION EVERY 4 (FOUR) HOURS AS NEEDED FOR WHEEZING OR SHORTNESS OF BREATH.   albuterol (VENTOLIN HFA) 108 (90 Base) MCG/ACT inhaler Inhale 2 puffs into the lungs every 4 (four) hours as needed for wheezing or shortness of breath.   albuterol (VENTOLIN HFA) 108 (90 Base) MCG/ACT inhaler Inhale 2 puffs into the lungs every 4 (four) hours as needed for wheezing or shortness of breath.    amLODipine (NORVASC) 10 MG tablet Take 10 mg by mouth daily.   apixaban (ELIQUIS) 5 MG TABS tablet Take 1 tablet (5 mg total) by mouth 2 (two) times daily.   ARIPiprazole (ABILIFY) 2 MG tablet Take 2 mg by mouth daily.   atorvastatin (LIPITOR) 10 MG tablet Take 10 mg by mouth every other day.   bismuth subsalicylate (PEPTO BISMOL) 262 MG/15ML suspension Take 30 mLs by mouth 2 (two) times daily as needed for indigestion.    budesonide (PULMICORT) 0.5 MG/2ML nebulizer solution Take 2 mLs (0.5 mg total) by nebulization 2 (two) times daily.   Calcium Carbonate Antacid (TUMS PO) Take 4 tablets by mouth 4 (four) times daily as needed (heartburn). Reported on 10/06/2015   carbidopa-levodopa (SINEMET IR) 10-100 MG tablet Take 1 tablet 3 (three) times daily by mouth.   cyanocobalamin (,VITAMIN B-12,) 1000 MCG/ML injection Inject 1,000 mcg into the muscle every 30 (thirty) days.   divalproex (DEPAKOTE ER) 500 MG 24 hr tablet Take 5 mg by mouth 2 (two) times daily.   EPINEPHrine (EPIPEN 2-PAK) 0.3 mg/0.3 mL IJ SOAJ injection Inject 0.3 mg into the muscle as needed for anaphylaxis.   famotidine (PEPCID) 20 MG tablet TAKE 1 TABLET EVERY NIGHT AT BEDTIME   levothyroxine (SYNTHROID) 25 MCG tablet    levothyroxine (SYNTHROID, LEVOTHROID) 50 MCG tablet Take 50 mcg daily before breakfast by mouth.   LORazepam (ATIVAN) 1 MG tablet 1  qhs   Melatonin 5 MG TABS Take 20 mg by mouth.   Multiple Vitamin (MULTIVITAMIN PO) Take 1 tablet by mouth daily.   ondansetron (ZOFRAN) 4 MG tablet    OXYGEN Inhale 2 L into the lungs daily. During day time   pantoprazole (PROTONIX) 40 MG tablet TAKE 1 TABLET TWICE DAILY   QUEtiapine (SEROQUEL) 100 MG tablet Take 1 tablet (100 mg total) by mouth at bedtime. (Patient taking differently: Take 200 mg by mouth at bedtime.)   tamsulosin (FLOMAX) 0.4 MG CAPS capsule Take 0.4 mg by mouth daily.   traMADol (ULTRAM) 50 MG tablet Take 50 mg by mouth every 6 (six) hours as needed.    traZODone (DESYREL) 50 MG tablet Take by mouth.   [DISCONTINUED] clopidogrel (PLAVIX) 75 MG tablet Take 75 mg by mouth daily.   [DISCONTINUED] ketoconazole (NIZORAL) 2 % shampoo    [DISCONTINUED] metFORMIN (GLUCOPHAGE) 500 MG tablet Take 500 mg by mouth 2 (two) times daily with a meal.   [DISCONTINUED] topiramate (TOPAMAX) 25 MG tablet Take 25 mg by mouth 2 (two) times daily.     Allergies:   Shellfish allergy, Coly-mycin s [neomycin-colist-hc-thonzonium], and Latex   Social History   Socioeconomic History   Marital status: Widowed    Spouse name: Not on file   Number  of children: Not on file   Years of education: Not on file   Highest education level: Not on file  Occupational History   Occupation: retired    Comment: Paramedic  Tobacco Use   Smoking status: Never    Passive exposure: Never   Smokeless tobacco: Never  Vaping Use   Vaping Use: Never used  Substance and Sexual Activity   Alcohol use: No   Drug use: No   Sexual activity: Not Currently  Other Topics Concern   Not on file  Social History Narrative   Retired Teaching laboratory technician, 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 There is no history of Colon cancer. ROS:   Please see the history of present illness.    All 14 point review of systems negative except as described per history of present illness  EKGs/Labs/Other Studies Reviewed:      Recent Labs: 02/03/2022: Hemoglobin 14.2; Platelets 228  Recent  Lipid Panel No results found for: "CHOL", "TRIG", "HDL", "CHOLHDL", "VLDL", "LDLCALC", "LDLDIRECT"  Physical Exam:    VS:  BP 122/60 (BP Location: Left Arm, Patient Position: Sitting)   Pulse (!) 57   Ht '5\' 8"'$  (1.727 m)   Wt 188 lb 6.4 oz (85.5 kg)   SpO2 98%   BMI 28.65 kg/m     Wt Readings from Last 3 Encounters:  04/07/22 188 lb 6.4 oz (85.5 kg)  02/03/22 183 lb 3.2 oz (83.1 kg)  01/03/22 184 lb (83.5 kg)     GEN:  Well nourished, well developed in no acute distress HEENT: Normal NECK: No JVD; No carotid bruits LYMPHATICS: No lymphadenopathy CARDIAC: RRR, no murmurs, no rubs, no gallops RESPIRATORY:  Clear to auscultation without rales, wheezing or rhonchi  ABDOMEN: Soft, non-tender, non-distended MUSCULOSKELETAL:  No edema; No deformity  SKIN: Warm and dry LOWER EXTREMITIES: no swelling NEUROLOGIC:  Alert and oriented x 3 PSYCHIATRIC:  Normal affect   ASSESSMENT:    1. Paroxysmal atrial fibrillation (HCC)   2. Essential hypertension   3. Secondary parkinsonism, unspecified secondary Parkinsonism type ([place])    PLAN:    In order of problems listed above:  Paroxysmal atrial fibrillation seems to maintain sinus rhythm, he is anticoagulant Eliquis which I will continue. Essential hypertension blood pressure well controlled continue present management. Dyslipidemia I did review his K PN data from end of last year showing LDL of 28 HDL 42.  He is on very small dose of Lipitor Parkinson followed by neurology   Medication Adjustments/Labs and Tests Ordered: Current medicines are reviewed at length with the patient today.  Concerns regarding medicines are outlined above.  No orders of the defined types were placed in this encounter.  Medication changes: No orders of the defined types were placed in this encounter.   Signed, Park Liter, MD, [facility] 04/07/2022 3:18 PM    [place]

## 2022-04-07 NOTE — Patient Instructions (Signed)
Medication Instructions:  Your physician recommends that you continue on your current medications as directed. Please refer to the Current Medication list given to you today.  *If you need a refill on your cardiac medications before your next appointment, please call your pharmacy*   Lab Work: None If you have labs (blood work) drawn today and your tests are completely normal, you will receive your results only by: MyChart Message (if you have MyChart) OR A paper copy in the mail If you have any lab test that is abnormal or we need to change your treatment, we will call you to review the results.   Testing/Procedures: None   Follow-Up: At [facility], you and your health needs are our priority.  As part of our continuing mission to provide you with exceptional heart care, we have created designated Provider Care Teams.  These Care Teams include your primary Cardiologist (physician) and Advanced Practice Providers (APPs -  Physician Assistants and Nurse Practitioners) who all work together to provide you with the care you need, when you need it.  We recommend signing up for the patient portal called "MyChart".  Sign up information is provided on this After Visit Summary.  MyChart is used to connect with patients for Virtual Visits (Telemedicine).  Patients are able to view lab/test results, encounter notes, upcoming appointments, etc.  Non-urgent messages can be sent to your provider as well.   To learn more about what you can do with MyChart, go to [email or web address].    Your next appointment:   6 month(s)  The format for your next appointment:   In Person  Provider:   Elihu Krasowski, MD    Other Instructions None  Important Information About Sugar       

## 2022-04-13 ENCOUNTER — Encounter: Payer: Self-pay | Admitting: Allergy and Immunology

## 2022-04-13 ENCOUNTER — Ambulatory Visit: Payer: Medicare HMO | Admitting: Allergy and Immunology

## 2022-04-13 VITALS — BP 124/72 | HR 70 | Resp 16

## 2022-04-13 DIAGNOSIS — K219 Gastro-esophageal reflux disease without esophagitis: Secondary | ICD-10-CM

## 2022-04-13 DIAGNOSIS — G4734 Idiopathic sleep related nonobstructive alveolar hypoventilation: Secondary | ICD-10-CM | POA: Diagnosis not present

## 2022-04-13 DIAGNOSIS — R0902 Hypoxemia: Secondary | ICD-10-CM

## 2022-04-13 DIAGNOSIS — J4540 Moderate persistent asthma, uncomplicated: Secondary | ICD-10-CM | POA: Diagnosis not present

## 2022-04-13 NOTE — Patient Instructions (Addendum)
  1.  Every day use Pulmicort/budesonide 0.5 mg nebulized a.m. and p.m.  2.  Every day use Pepcid/famotidine 20 mg a.m. and p.m.  3.  If needed: Albuterol HFA - 2 inhalations every 4-6 hours  4. Return to clinic in 4 weeks or earlier if problem  5. Obtain fall flu vaccine and RSV vaccine  6. Obtain a nocturnal oximetry study

## 2022-04-13 NOTE — Progress Notes (Signed)
[facility] [place] [facility] [place]   Follow-up Note  Referring Provider: Ginger Organ., MD Primary Provider: Ginger Organ., MD Date of Office Visit: 04/13/2022  Subjective:   Darrol Jump (DOB: 01/30/1941) is a 81 y.o. male who returns to the [facility] [place] on 04/13/2022 in re-evaluation of the following:  HPI: [name] returns to this clinic in evaluation of breathing problems.  I have never seen him in this clinic.  His last visit with Dr. Nelva Bush was his initial evaluation of 03 Jan 2022.  He complains of having wheezing and shortness of breath and a dry cough which has been a persistent issue.  He uses albuterol every night and it does appear to help him for about an hour.  He has a home oximeter and apparently his oxygen has been between 94 to 97% every time it is checked. He is very limited in his ability to exercise because of his Parkinson's.  He has reflux.  He has regurgitation and some burning.  He does not really use Pepcid very often averaging out to 1-2 times per month.  He apparently has atrial fibrillation with good rate control and is followed by Dr. Agustin Cree for this issue.  Allergies as of 04/13/2022       Reactions   Shellfish Allergy Anaphylaxis   Patient informed RN no problem with Betadine 08/04/2015    Coly-mycin S [neomycin-colist-hc-thonzonium] Rash   Latex Rash   Reports facial rash from Cpap mask        Medication List    albuterol 108 (90 Base) MCG/ACT inhaler Commonly known as: VENTOLIN HFA Inhale 2 puffs into the lungs every 4 (four) hours as needed for wheezing or shortness of breath.   albuterol (2.5 MG/3ML) 0.083% nebulizer solution Commonly known as: PROVENTIL TAKE 3 MLS (2.5 MG TOTAL) BY NEBULIZATION EVERY 4 (FOUR) HOURS AS NEEDED FOR WHEEZING OR SHORTNESS OF BREATH.   amLODipine 10 MG tablet Commonly known as: NORVASC Take 10 mg by mouth daily.   apixaban 5 MG Tabs tablet Commonly  known as: ELIQUIS Take 1 tablet (5 mg total) by mouth 2 (two) times daily.   ARIPiprazole 2 MG tablet Commonly known as: ABILIFY Take 2 mg by mouth daily.   atorvastatin 10 MG tablet Commonly known as: LIPITOR Take 10 mg by mouth every other day.   bismuth subsalicylate 779 TJ/03ES suspension Commonly known as: PEPTO BISMOL Take 30 mLs by mouth 2 (two) times daily as needed for indigestion.   budesonide 0.5 MG/2ML nebulizer solution Commonly known as: Pulmicort Take 2 mLs (0.5 mg total) by nebulization 2 (two) times daily.   carbidopa-levodopa 10-100 MG tablet Commonly known as: SINEMET IR Take 1 tablet 3 (three) times daily by mouth.   cyanocobalamin 1000 MCG/ML injection Commonly known as: VITAMIN B12 Inject 1,000 mcg into the muscle every 30 (thirty) days.   divalproex 500 MG 24 hr tablet Commonly known as: DEPAKOTE ER Take 5 mg by mouth 2 (two) times daily.   EpiPen 2-Pak 0.3 mg/0.3 mL Soaj injection Generic drug: EPINEPHrine Inject 0.3 mg into the muscle as needed for anaphylaxis.   famotidine 20 MG tablet Commonly known as: PEPCID TAKE 1 TABLET EVERY NIGHT AT BEDTIME   levothyroxine 50 MCG tablet Commonly known as: SYNTHROID Take 50 mcg daily before breakfast by mouth.   levothyroxine 25 MCG tablet Commonly known as: SYNTHROID   LORazepam 1 MG tablet Commonly known as: Ativan 1  qhs   melatonin 5 MG Tabs Take 20 mg by mouth.   MULTIVITAMIN PO Take 1 tablet by mouth daily.   ondansetron 4 MG tablet Commonly known as: ZOFRAN   OXYGEN Inhale 2 L into the lungs daily. During day time   pantoprazole 40 MG tablet Commonly known as: PROTONIX TAKE 1 TABLET TWICE DAILY   QUEtiapine 100 MG tablet Commonly known as: SEROQUEL Take 1 tablet (100 mg total) by mouth at bedtime.   tamsulosin 0.4 MG Caps capsule Commonly known as: FLOMAX Take 0.4 mg by mouth daily.   traMADol 50 MG tablet Commonly known as: ULTRAM Take 50 mg by mouth every 6 (six)  hours as needed.   traZODone 50 MG tablet Commonly known as: DESYREL Take by mouth.   TUMS PO Take 4 tablets by mouth 4 (four) times daily as needed (heartburn). Reported on 10/06/2015    Past Medical History:  Diagnosis Date   Anxiety    Xanax as needed   Arthritis    spinal stenosis.   Asthma    Barrett esophagus    Bipolar disorder (HCC)    BPH (benign prostatic hyperplasia)    Cataract    Chronic kidney disease    since NSAID's stopped -function has improved   COPD (chronic obstructive pulmonary disease) (HCC)    Crohn's colitis ([place])    Depression    takes Cymbalta daily   Diverticulosis 01/2007   DM (diabetes mellitus) ([place])    takes Januvia now, metformin stopped due to diarrhea   ED (erectile dysfunction)    GERD (gastroesophageal reflux disease)    uses Tums   Glaucoma    surgery to correct   History of colon polyps    HLD (hyperlipidemia)    takes Lipitor daily   HTN (hypertension)    takes Altace and Diovan daily   Impaired hearing    "getting this checked out on Monday"   Insomnia    Internal hemorrhoids 01/2007   Joint pain    Joint swelling    Microscopic colitis    Nocturia    Obesity    Osteoarthritis    Pneumonia 3/09   Sleep apnea    uses CPAP;has been greater than 17yr since sleep study- Dr. AElsworth Soho   Past Surgical History:  Procedure Laterality Date   CATARACT EXTRACTION W/ INTRAOCULAR LENS  IMPLANT, BILATERAL  2011   CHOLECYSTECTOMY  2011   COLONOSCOPY  01/01/2014   Mild patchy colitis of questionable importance. Moderate predominantly sigmoid diverticulosis. Small internal hemorrhoids. Limited examination due to quality of preparation.     COLONOSCOPY W/ BIOPSIES  2008   Looks normal, biopsy suggested a chronic microscopic colitis with rare granulomas   COLONOSCOPY W/ BIOPSIES  02/2011   diverticulosis, internal hemorrhoids, patchy chronic colitis on random bxs,    ESOPHAGOGASTRODUODENOSCOPY  07/18/2017   Barretts esophagus. Small  hiatal hernia.    EYE SURGERY     for glaucoma   HERNIA REPAIR     LUMBAR LAMINECTOMY/DECOMPRESSION MICRODISCECTOMY N/A 08/04/2015   Procedure: CENTRAL DECOMPRESSION  LUMBAR LAMINECTOMY L4-L5 ;  Surgeon: RLatanya Maudlin MD;  Location: [facility];  Service: Orthopedics;  Laterality: N/A;   NOSE SURGERY  2011   PENILE PROSTHESIS IMPLANT  2012   ROTATOR CUFF REPAIR Bilateral    '12 -left, '13 -right   SHOULDER ARTHROSCOPY  08/24/2011   Procedure: ARTHROSCOPY SHOULDER;  Surgeon: KMarin Shutter  [place] [facility]  Service: Orthopedics;  Laterality: Right;  RIGHT SHOULDER  ARTHROSCOPY WITH SUBACROMINAL DECOMPRESSION AND DISTAL CLAVICAL RESECTION    sleep apnea surgery  1997   TONSILLECTOMY     as a child   UMBILICAL HERNIA REPAIR  5+yrs ago    Review of systems negative except as noted in HPI / PMHx or noted below:  Review of Systems  Constitutional: Negative.   HENT: Negative.    Eyes: Negative.   Respiratory: Negative.    Cardiovascular: Negative.   Gastrointestinal: Negative.   Genitourinary: Negative.   Musculoskeletal: Negative.   Skin: Negative.   Neurological: Negative.   Endo/Heme/Allergies: Negative.   Psychiatric/Behavioral: Negative.       Objective:   Vitals:   04/13/22 1101  BP: 124/72  Pulse: 70  Resp: 16  SpO2: 97%          Physical Exam Constitutional:      Appearance: He is not diaphoretic.  HENT:     Head: Normocephalic.     Right Ear: Tympanic membrane, ear canal and external ear normal.     Left Ear: Tympanic membrane, ear canal and external ear normal.     Nose: Nose normal. No mucosal edema or rhinorrhea.     Mouth/Throat:     Pharynx: Uvula midline. No oropharyngeal exudate.  Eyes:     Conjunctiva/sclera: Conjunctivae normal.  Neck:     Thyroid: No thyromegaly.     Trachea: Trachea normal. No tracheal tenderness or tracheal deviation.  Cardiovascular:     Rate and Rhythm: Normal rate and regular rhythm.     Heart sounds: Normal heart sounds,  S1 normal and S2 normal. No murmur heard. Pulmonary:     Effort: No respiratory distress.     Breath sounds: Normal breath sounds. No stridor. No wheezing or rales.  Lymphadenopathy:     Head:     Right side of head: No tonsillar adenopathy.     Left side of head: No tonsillar adenopathy.     Cervical: No cervical adenopathy.  Skin:    Findings: No erythema or rash.     Nails: There is no clubbing.  Neurological:     Mental Status: He is alert.     Diagnostics:    Spirometry was performed and demonstrated an FEV1 of 2.10 at 77 % of predicted.  Results of an echocardiogram obtained 09 February 2022 identified the following:   1. Left ventricular ejection fraction, by estimation, is 60 to 65%. The  left ventricle has normal function. The left ventricle has no regional  wall motion abnormalities. There is mild left ventricular hypertrophy.  Left ventricular diastolic parameters  are consistent with Grade II diastolic dysfunction (pseudonormalization).   2. Right ventricular systolic function is normal. The right ventricular  size is normal. There is normal pulmonary artery systolic pressure.   3. The mitral valve is normal in structure. Mild mitral valve  regurgitation. No evidence of mitral stenosis.   4. The aortic valve is normal in structure. Aortic valve regurgitation is  mild. No aortic stenosis is present.   5. There is mild dilatation of the ascending aorta, measuring 39 mm.   6. The inferior vena cava is normal in size with greater than 50%  respiratory variability, suggesting right atrial pressure of 3 mmHg.   Assessment and Plan:   1. Asthma, moderate persistent, well-controlled   2. Gastroesophageal reflux disease, unspecified whether esophagitis present   3. Nocturnal hypoxemia    1.  Every day use Pulmicort/budesonide 0.5 mg nebulized a.m. and p.m.  2.  Every day use Pepcid/famotidine 20 mg a.m. and p.m.  3.  If needed: Albuterol HFA - 2 inhalations every 4-6  hours  4. Return to clinic in 4 weeks or earlier if problem  5. Obtain fall flu vaccine and RSV vaccine  6. Obtain a nocturnal oximetry study  I am going to have [name] consistently use anti-inflammatory medications for his lower airway with the use of Pulmicort for the next 4 weeks and we will regroup with him at that point in time to assess his response to this treatment.  As well, he does appear to have some reflux issues which may also be interfering with the function of his airway and he will use famotidine on a consistent basis.  I think it would be worthwhile to obtain a nocturnal oximetry study to make sure were not dealing with hypoxemia at night.  He apparently had a sleep study performed in 2014 which did not identify any hypoxemia at nighttime but certainly his respiratory status and his overall health status has changed since then.  Allena Katz, MD Allergy / Immunology [place]

## 2022-04-18 ENCOUNTER — Encounter: Payer: Self-pay | Admitting: Allergy and Immunology

## 2022-04-20 DIAGNOSIS — R0602 Shortness of breath: Secondary | ICD-10-CM | POA: Diagnosis not present

## 2022-04-20 DIAGNOSIS — J45909 Unspecified asthma, uncomplicated: Secondary | ICD-10-CM | POA: Diagnosis not present

## 2022-05-09 DIAGNOSIS — R269 Unspecified abnormalities of gait and mobility: Secondary | ICD-10-CM | POA: Diagnosis not present

## 2022-05-09 DIAGNOSIS — M6281 Muscle weakness (generalized): Secondary | ICD-10-CM | POA: Diagnosis not present

## 2022-05-15 ENCOUNTER — Ambulatory Visit: Payer: Medicare HMO | Admitting: Allergy and Immunology

## 2022-06-07 ENCOUNTER — Ambulatory Visit: Payer: Medicare HMO | Admitting: Allergy and Immunology

## 2022-06-13 ENCOUNTER — Telehealth: Payer: Self-pay

## 2022-06-13 NOTE — Telephone Encounter (Signed)
   [facility] Pre-operative Risk Assessment    Request for surgical clearance:  What type of surgery is being performed? Tooth #14 extraction using epi   When is this surgery scheduled? TBD   What type of clearance is required (medical clearance vs. Pharmacy clearance to hold med vs. Both)? Both  Are there any medications that need to be held prior to surgery and how long? Eliquis, Plavix and several other meds (not specified) holding length not specified   Practice name and name of physician performing surgery?  [facility], provider not specified.  What is your office phone number:  [phone]   7.   What is your office fax number: [phone]  8.   Anesthesia type (None, local, MAC, general) ? Not specified   [name] 06/13/2022, 8:44 AM  _________________________________________________________________   (provider comments below)

## 2022-06-13 NOTE — Telephone Encounter (Signed)
   Patient Name: [name]  DOB: Jul 01, 1941 MRN: [id number]  Primary Cardiologist: Dr. Park Liter  Chart reviewed as part of pre-operative protocol coverage.   IF SIMPLE EXTRACTION/CLEANINGS: Simple dental extractions (i.e. 1-2 teeth) are considered low risk procedures per guidelines and generally do not require any specific cardiac clearance. It is also generally accepted that for simple extractions and dental cleanings, there is no need to interrupt blood thinner therapy.  IF MULTIPLE EXTRACTIONS OR COMPLEX DENTAL PROCEDURES: (follow standard pre-op clearance process). The patient was advised that if he develops new symptoms prior to surgery to contact our office to arrange for a follow-up visit, and he verbalized understanding.  SBE prophylaxis is not required for the patient from a cardiac standpoint.  I will route this recommendation to pharm D regarding request to hold Eliquis.    Finis Bud, NP 06/13/2022, 1:08 PM

## 2022-06-13 NOTE — Telephone Encounter (Signed)
No Eliquis hold needed for 1 extraction

## 2022-06-14 NOTE — Telephone Encounter (Addendum)
   Patient Name: [name]  DOB: March 26, 1941 MRN: [id number]  Primary Cardiologist: Dr. Park Liter  Chart reviewed as part of pre-operative protocol coverage.   IF SIMPLE EXTRACTION/CLEANINGS: Simple dental extractions (i.e. 1-2 teeth) are considered low risk procedures per guidelines and generally do not require any specific cardiac clearance. It is also generally accepted that for simple extractions and dental cleanings, there is no need to interrupt blood thinner therapy.  SBE prophylaxis is not required for the patient from a cardiac standpoint.  I will route this recommendation to the requesting party via Epic fax function and remove from pre-op pool.  Please call with questions.  Finis Bud, NP 06/14/2022, 2:16 PM

## 2022-06-22 ENCOUNTER — Ambulatory Visit: Payer: Medicare HMO | Admitting: Podiatry

## 2022-06-28 DIAGNOSIS — F0390 Unspecified dementia without behavioral disturbance: Secondary | ICD-10-CM | POA: Diagnosis not present

## 2022-06-28 DIAGNOSIS — N390 Urinary tract infection, site not specified: Secondary | ICD-10-CM | POA: Diagnosis not present

## 2022-06-28 DIAGNOSIS — N401 Enlarged prostate with lower urinary tract symptoms: Secondary | ICD-10-CM | POA: Diagnosis not present

## 2022-06-28 DIAGNOSIS — R32 Unspecified urinary incontinence: Secondary | ICD-10-CM | POA: Diagnosis not present

## 2022-07-03 DIAGNOSIS — N390 Urinary tract infection, site not specified: Secondary | ICD-10-CM | POA: Diagnosis not present

## 2022-07-13 ENCOUNTER — Telehealth: Payer: Self-pay | Admitting: Cardiology

## 2022-07-13 NOTE — Telephone Encounter (Signed)
Patient calling the office for samples of medication:   1.  What medication and dosage are you requesting samples for? apixaban (ELIQUIS) 5 MG TABS tablet   2.  Are you currently out of this medication? His pharmacy won't be able to get it to him for another week, wants to know if he can get a weeks worth of samples.

## 2022-07-13 NOTE — Telephone Encounter (Signed)
Samples and paperwork placed for pickup.

## 2022-08-02 ENCOUNTER — Other Ambulatory Visit: Payer: Self-pay | Admitting: Cardiology

## 2022-08-02 DIAGNOSIS — I480 Paroxysmal atrial fibrillation: Secondary | ICD-10-CM

## 2022-08-02 NOTE — Telephone Encounter (Addendum)
Eliquis '5mg'$  refill request received. Patient is 81 years old, weight-85.5kg, Crea-1.20 on 03/24/2020 via [place] from [place], [place], and last seen by Dr. Agustin Cree on 04/07/2022. Dose is appropriate based on dosing criteria.   Pt needs updated labs.  Called PCP to see if any updated labs. Had to leave a message for the medical records team.  They returned call and stated no labs available. Pt will need an appt for labs.

## 2022-08-03 ENCOUNTER — Other Ambulatory Visit: Payer: Self-pay

## 2022-08-03 DIAGNOSIS — I480 Paroxysmal atrial fibrillation: Secondary | ICD-10-CM

## 2022-08-03 NOTE — Telephone Encounter (Signed)
Called and spoke with Lanelle Bal, pt's granddaughter and pt over the phone. Made them aware of the need for updated labs for future refills. Lab order placed. Lanelle Bal stated pt would go to [place] office lab one day next week. Provided pt with call back number if they have any issues for further questions.  Refill sent to prevent any missed doses.

## 2022-08-17 ENCOUNTER — Other Ambulatory Visit: Payer: Self-pay | Admitting: *Deleted

## 2022-08-17 DIAGNOSIS — I480 Paroxysmal atrial fibrillation: Secondary | ICD-10-CM | POA: Diagnosis not present

## 2022-08-18 LAB — BASIC METABOLIC PANEL
BUN/Creatinine Ratio: 10 (ref 10–24)
BUN: 14 mg/dL (ref 8–27)
CO2: 23 mmol/L (ref 20–29)
Calcium: 9.1 mg/dL (ref 8.6–10.2)
Chloride: 104 mmol/L (ref 96–106)
Creatinine, Ser: 1.43 mg/dL — ABNORMAL HIGH (ref 0.76–1.27)
Glucose: 142 mg/dL — ABNORMAL HIGH (ref 70–99)
Potassium: 4 mmol/L (ref 3.5–5.2)
Sodium: 146 mmol/L — ABNORMAL HIGH (ref 134–144)
eGFR: 50 mL/min/{1.73_m2} — ABNORMAL LOW (ref >59)

## 2022-08-18 LAB — CBC WITH DIFFERENTIAL/PLATELET
Basophils Absolute: 0.1 10*3/uL (ref 0.0–0.2)
Basos: 1 %
EOS (ABSOLUTE): 0.6 10*3/uL — ABNORMAL HIGH (ref 0.0–0.4)
Eos: 10 %
Hematocrit: 43.9 % (ref 37.5–51.0)
Hemoglobin: 14.7 g/dL (ref 13.0–17.7)
Immature Grans (Abs): 0 10*3/uL (ref 0.0–0.1)
Immature Granulocytes: 0 %
Lymphocytes Absolute: 1.5 10*3/uL (ref 0.7–3.1)
Lymphs: 26 %
MCH: 29.3 pg (ref 26.6–33.0)
MCHC: 33.5 g/dL (ref 31.5–35.7)
MCV: 88 fL (ref 79–97)
Monocytes Absolute: 0.6 10*3/uL (ref 0.1–0.9)
Monocytes: 10 %
Neutrophils Absolute: 3 10*3/uL (ref 1.4–7.0)
Neutrophils: 53 %
Platelets: 190 10*3/uL (ref 150–450)
RBC: 5.02 x10E6/uL (ref 4.14–5.80)
RDW: 15.6 % — ABNORMAL HIGH (ref 11.6–15.4)
WBC: 5.8 10*3/uL (ref 3.4–10.8)

## 2022-08-24 ENCOUNTER — Telehealth: Payer: Self-pay

## 2022-08-24 DIAGNOSIS — F0390 Unspecified dementia without behavioral disturbance: Secondary | ICD-10-CM | POA: Diagnosis not present

## 2022-08-24 DIAGNOSIS — N401 Enlarged prostate with lower urinary tract symptoms: Secondary | ICD-10-CM | POA: Diagnosis not present

## 2022-08-24 DIAGNOSIS — Z125 Encounter for screening for malignant neoplasm of prostate: Secondary | ICD-10-CM | POA: Diagnosis not present

## 2022-08-24 DIAGNOSIS — N390 Urinary tract infection, site not specified: Secondary | ICD-10-CM | POA: Diagnosis not present

## 2022-08-24 DIAGNOSIS — R32 Unspecified urinary incontinence: Secondary | ICD-10-CM | POA: Diagnosis not present

## 2022-08-24 NOTE — Telephone Encounter (Signed)
Results reviewed with pt's daughterLanelle Bal- per [facility]- as per Dr. Wendy Poet note.  Pt's daughter verbalized understanding and had no additional questions. Routed to PCP

## 2022-08-25 DIAGNOSIS — R7989 Other specified abnormal findings of blood chemistry: Secondary | ICD-10-CM | POA: Diagnosis not present

## 2022-08-25 DIAGNOSIS — E1129 Type 2 diabetes mellitus with other diabetic kidney complication: Secondary | ICD-10-CM | POA: Diagnosis not present

## 2022-08-25 DIAGNOSIS — N401 Enlarged prostate with lower urinary tract symptoms: Secondary | ICD-10-CM | POA: Diagnosis not present

## 2022-08-25 DIAGNOSIS — E039 Hypothyroidism, unspecified: Secondary | ICD-10-CM | POA: Diagnosis not present

## 2022-08-25 DIAGNOSIS — E785 Hyperlipidemia, unspecified: Secondary | ICD-10-CM | POA: Diagnosis not present

## 2022-08-25 DIAGNOSIS — E538 Deficiency of other specified B group vitamins: Secondary | ICD-10-CM | POA: Diagnosis not present

## 2022-08-25 DIAGNOSIS — I10 Essential (primary) hypertension: Secondary | ICD-10-CM | POA: Diagnosis not present

## 2022-08-25 DIAGNOSIS — N529 Male erectile dysfunction, unspecified: Secondary | ICD-10-CM | POA: Diagnosis not present

## 2022-08-29 DIAGNOSIS — G20A1 Parkinson's disease without dyskinesia, without mention of fluctuations: Secondary | ICD-10-CM | POA: Diagnosis not present

## 2022-08-29 DIAGNOSIS — N1831 Chronic kidney disease, stage 3a: Secondary | ICD-10-CM | POA: Diagnosis not present

## 2022-08-29 DIAGNOSIS — Z1339 Encounter for screening examination for other mental health and behavioral disorders: Secondary | ICD-10-CM | POA: Diagnosis not present

## 2022-08-29 DIAGNOSIS — D6869 Other thrombophilia: Secondary | ICD-10-CM | POA: Diagnosis not present

## 2022-08-29 DIAGNOSIS — G3109 Other frontotemporal dementia: Secondary | ICD-10-CM | POA: Diagnosis not present

## 2022-08-29 DIAGNOSIS — R82998 Other abnormal findings in urine: Secondary | ICD-10-CM | POA: Diagnosis not present

## 2022-08-29 DIAGNOSIS — F0280 Dementia in other diseases classified elsewhere without behavioral disturbance: Secondary | ICD-10-CM | POA: Diagnosis not present

## 2022-08-29 DIAGNOSIS — I480 Paroxysmal atrial fibrillation: Secondary | ICD-10-CM | POA: Diagnosis not present

## 2022-08-29 DIAGNOSIS — Z1331 Encounter for screening for depression: Secondary | ICD-10-CM | POA: Diagnosis not present

## 2022-08-29 DIAGNOSIS — E1129 Type 2 diabetes mellitus with other diabetic kidney complication: Secondary | ICD-10-CM | POA: Diagnosis not present

## 2022-08-29 DIAGNOSIS — I10 Essential (primary) hypertension: Secondary | ICD-10-CM | POA: Diagnosis not present

## 2022-08-29 DIAGNOSIS — F319 Bipolar disorder, unspecified: Secondary | ICD-10-CM | POA: Diagnosis not present

## 2022-08-29 DIAGNOSIS — Z23 Encounter for immunization: Secondary | ICD-10-CM | POA: Diagnosis not present

## 2022-08-29 DIAGNOSIS — Z0000 Encounter for general adult medical examination without abnormal findings: Secondary | ICD-10-CM | POA: Diagnosis not present

## 2022-10-19 ENCOUNTER — Ambulatory Visit: Payer: Medicare HMO | Admitting: Podiatry

## 2022-11-16 ENCOUNTER — Other Ambulatory Visit: Payer: Self-pay | Admitting: Cardiology

## 2022-11-16 DIAGNOSIS — I480 Paroxysmal atrial fibrillation: Secondary | ICD-10-CM

## 2022-11-16 NOTE — Telephone Encounter (Signed)
Eliquis 5mg  refill request received. Patient is 82 years old, weight-85.5kg, Crea-1.43 on 08/17/22, Diagnosis-Afib, and last [facility] by [facility] on 04/07/22. Dose is appropriate based on dosing criteria. Will send in refill to requested pharmacy.

## 2022-12-07 DIAGNOSIS — Z1389 Encounter for screening for other disorder: Secondary | ICD-10-CM | POA: Diagnosis not present

## 2022-12-07 DIAGNOSIS — M62561 Muscle wasting and atrophy, not elsewhere classified, right lower leg: Secondary | ICD-10-CM | POA: Diagnosis not present

## 2022-12-07 DIAGNOSIS — M47816 Spondylosis without myelopathy or radiculopathy, lumbar region: Secondary | ICD-10-CM | POA: Diagnosis not present

## 2022-12-07 DIAGNOSIS — G894 Chronic pain syndrome: Secondary | ICD-10-CM | POA: Diagnosis not present

## 2022-12-07 DIAGNOSIS — R269 Unspecified abnormalities of gait and mobility: Secondary | ICD-10-CM | POA: Diagnosis not present

## 2022-12-07 DIAGNOSIS — M549 Dorsalgia, unspecified: Secondary | ICD-10-CM | POA: Diagnosis not present

## 2022-12-07 DIAGNOSIS — M961 Postlaminectomy syndrome, not elsewhere classified: Secondary | ICD-10-CM | POA: Diagnosis not present

## 2022-12-08 DIAGNOSIS — R918 Other nonspecific abnormal finding of lung field: Secondary | ICD-10-CM | POA: Diagnosis not present

## 2022-12-08 DIAGNOSIS — Z111 Encounter for screening for respiratory tuberculosis: Secondary | ICD-10-CM | POA: Diagnosis not present

## 2022-12-19 DIAGNOSIS — E1142 Type 2 diabetes mellitus with diabetic polyneuropathy: Secondary | ICD-10-CM | POA: Diagnosis not present

## 2022-12-19 DIAGNOSIS — F02A0 Dementia in other diseases classified elsewhere, mild, without behavioral disturbance, psychotic disturbance, mood disturbance, and anxiety: Secondary | ICD-10-CM | POA: Diagnosis not present

## 2022-12-19 DIAGNOSIS — G20A1 Parkinson's disease without dyskinesia, without mention of fluctuations: Secondary | ICD-10-CM | POA: Diagnosis not present

## 2022-12-19 DIAGNOSIS — K219 Gastro-esophageal reflux disease without esophagitis: Secondary | ICD-10-CM | POA: Diagnosis not present

## 2022-12-19 DIAGNOSIS — N400 Enlarged prostate without lower urinary tract symptoms: Secondary | ICD-10-CM | POA: Diagnosis not present

## 2022-12-19 DIAGNOSIS — I10 Essential (primary) hypertension: Secondary | ICD-10-CM | POA: Diagnosis not present

## 2022-12-22 DIAGNOSIS — M25551 Pain in right hip: Secondary | ICD-10-CM | POA: Diagnosis not present

## 2022-12-22 DIAGNOSIS — S0990XA Unspecified injury of head, initial encounter: Secondary | ICD-10-CM | POA: Diagnosis not present

## 2022-12-22 DIAGNOSIS — N184 Chronic kidney disease, stage 4 (severe): Secondary | ICD-10-CM | POA: Diagnosis not present

## 2022-12-22 DIAGNOSIS — Z7901 Long term (current) use of anticoagulants: Secondary | ICD-10-CM | POA: Diagnosis not present

## 2022-12-22 DIAGNOSIS — R001 Bradycardia, unspecified: Secondary | ICD-10-CM | POA: Diagnosis not present

## 2022-12-22 DIAGNOSIS — E119 Type 2 diabetes mellitus without complications: Secondary | ICD-10-CM | POA: Diagnosis not present

## 2022-12-22 DIAGNOSIS — I129 Hypertensive chronic kidney disease with stage 1 through stage 4 chronic kidney disease, or unspecified chronic kidney disease: Secondary | ICD-10-CM | POA: Diagnosis not present

## 2022-12-22 DIAGNOSIS — W19XXXA Unspecified fall, initial encounter: Secondary | ICD-10-CM | POA: Diagnosis not present

## 2022-12-22 DIAGNOSIS — Z043 Encounter for examination and observation following other accident: Secondary | ICD-10-CM | POA: Diagnosis not present

## 2022-12-22 DIAGNOSIS — M25552 Pain in left hip: Secondary | ICD-10-CM | POA: Diagnosis not present

## 2022-12-22 DIAGNOSIS — K5730 Diverticulosis of large intestine without perforation or abscess without bleeding: Secondary | ICD-10-CM | POA: Diagnosis not present

## 2022-12-23 DIAGNOSIS — Z7401 Bed confinement status: Secondary | ICD-10-CM | POA: Diagnosis not present

## 2022-12-23 DIAGNOSIS — R001 Bradycardia, unspecified: Secondary | ICD-10-CM | POA: Diagnosis not present

## 2023-01-05 DIAGNOSIS — I10 Essential (primary) hypertension: Secondary | ICD-10-CM | POA: Diagnosis not present

## 2023-01-05 DIAGNOSIS — E1142 Type 2 diabetes mellitus with diabetic polyneuropathy: Secondary | ICD-10-CM | POA: Diagnosis not present

## 2023-01-08 DIAGNOSIS — E1165 Type 2 diabetes mellitus with hyperglycemia: Secondary | ICD-10-CM | POA: Diagnosis not present

## 2023-01-16 ENCOUNTER — Ambulatory Visit: Payer: Medicare HMO | Admitting: Podiatry

## 2023-01-24 DIAGNOSIS — M79671 Pain in right foot: Secondary | ICD-10-CM | POA: Diagnosis not present

## 2023-01-24 DIAGNOSIS — L600 Ingrowing nail: Secondary | ICD-10-CM | POA: Diagnosis not present

## 2023-01-24 DIAGNOSIS — M79672 Pain in left foot: Secondary | ICD-10-CM | POA: Diagnosis not present

## 2023-01-24 DIAGNOSIS — E1140 Type 2 diabetes mellitus with diabetic neuropathy, unspecified: Secondary | ICD-10-CM | POA: Diagnosis not present

## 2023-01-24 DIAGNOSIS — B351 Tinea unguium: Secondary | ICD-10-CM | POA: Diagnosis not present

## 2023-01-26 DIAGNOSIS — E1142 Type 2 diabetes mellitus with diabetic polyneuropathy: Secondary | ICD-10-CM | POA: Diagnosis not present

## 2023-01-26 DIAGNOSIS — D518 Other vitamin B12 deficiency anemias: Secondary | ICD-10-CM | POA: Diagnosis not present

## 2023-01-26 DIAGNOSIS — E782 Mixed hyperlipidemia: Secondary | ICD-10-CM | POA: Diagnosis not present

## 2023-01-26 DIAGNOSIS — E119 Type 2 diabetes mellitus without complications: Secondary | ICD-10-CM | POA: Diagnosis not present

## 2023-01-26 DIAGNOSIS — E559 Vitamin D deficiency, unspecified: Secondary | ICD-10-CM | POA: Diagnosis not present

## 2023-01-26 DIAGNOSIS — E038 Other specified hypothyroidism: Secondary | ICD-10-CM | POA: Diagnosis not present

## 2023-01-26 DIAGNOSIS — I10 Essential (primary) hypertension: Secondary | ICD-10-CM | POA: Diagnosis not present

## 2023-02-02 ENCOUNTER — Ambulatory Visit: Payer: Medicare HMO | Admitting: Podiatry

## 2023-02-05 ENCOUNTER — Other Ambulatory Visit: Payer: Self-pay | Admitting: Allergy

## 2023-02-08 DIAGNOSIS — D518 Other vitamin B12 deficiency anemias: Secondary | ICD-10-CM | POA: Diagnosis not present

## 2023-02-08 DIAGNOSIS — E559 Vitamin D deficiency, unspecified: Secondary | ICD-10-CM | POA: Diagnosis not present

## 2023-02-08 DIAGNOSIS — E119 Type 2 diabetes mellitus without complications: Secondary | ICD-10-CM | POA: Diagnosis not present

## 2023-02-08 DIAGNOSIS — E038 Other specified hypothyroidism: Secondary | ICD-10-CM | POA: Diagnosis not present

## 2023-02-08 DIAGNOSIS — E782 Mixed hyperlipidemia: Secondary | ICD-10-CM | POA: Diagnosis not present

## 2023-02-08 DIAGNOSIS — Z79899 Other long term (current) drug therapy: Secondary | ICD-10-CM | POA: Diagnosis not present

## 2023-02-09 DIAGNOSIS — E1165 Type 2 diabetes mellitus with hyperglycemia: Secondary | ICD-10-CM | POA: Diagnosis not present

## 2023-02-11 DIAGNOSIS — G9389 Other specified disorders of brain: Secondary | ICD-10-CM | POA: Diagnosis not present

## 2023-02-11 DIAGNOSIS — W19XXXA Unspecified fall, initial encounter: Secondary | ICD-10-CM | POA: Diagnosis not present

## 2023-02-11 DIAGNOSIS — W06XXXA Fall from bed, initial encounter: Secondary | ICD-10-CM | POA: Diagnosis not present

## 2023-02-11 DIAGNOSIS — S0990XA Unspecified injury of head, initial encounter: Secondary | ICD-10-CM | POA: Diagnosis not present

## 2023-02-11 DIAGNOSIS — M545 Low back pain, unspecified: Secondary | ICD-10-CM | POA: Diagnosis not present

## 2023-02-11 DIAGNOSIS — S0003XA Contusion of scalp, initial encounter: Secondary | ICD-10-CM | POA: Diagnosis not present

## 2023-02-11 DIAGNOSIS — I6782 Cerebral ischemia: Secondary | ICD-10-CM | POA: Diagnosis not present

## 2023-02-11 DIAGNOSIS — M549 Dorsalgia, unspecified: Secondary | ICD-10-CM | POA: Diagnosis not present

## 2023-02-11 DIAGNOSIS — R531 Weakness: Secondary | ICD-10-CM | POA: Diagnosis not present

## 2023-02-14 ENCOUNTER — Ambulatory Visit: Payer: Medicare HMO | Admitting: Cardiology

## 2023-02-21 DIAGNOSIS — E1142 Type 2 diabetes mellitus with diabetic polyneuropathy: Secondary | ICD-10-CM | POA: Diagnosis not present

## 2023-02-21 DIAGNOSIS — D518 Other vitamin B12 deficiency anemias: Secondary | ICD-10-CM | POA: Diagnosis not present

## 2023-02-21 DIAGNOSIS — E038 Other specified hypothyroidism: Secondary | ICD-10-CM | POA: Diagnosis not present

## 2023-02-21 DIAGNOSIS — I10 Essential (primary) hypertension: Secondary | ICD-10-CM | POA: Diagnosis not present

## 2023-02-21 DIAGNOSIS — E559 Vitamin D deficiency, unspecified: Secondary | ICD-10-CM | POA: Diagnosis not present

## 2023-02-21 DIAGNOSIS — E119 Type 2 diabetes mellitus without complications: Secondary | ICD-10-CM | POA: Diagnosis not present

## 2023-02-21 DIAGNOSIS — E782 Mixed hyperlipidemia: Secondary | ICD-10-CM | POA: Diagnosis not present

## 2023-03-06 ENCOUNTER — Ambulatory Visit (INDEPENDENT_AMBULATORY_CARE_PROVIDER_SITE_OTHER): Payer: Medicare HMO | Admitting: Podiatry

## 2023-03-06 ENCOUNTER — Encounter: Payer: Self-pay | Admitting: Cardiology

## 2023-03-06 DIAGNOSIS — Z91199 Patient's noncompliance with other medical treatment and regimen due to unspecified reason: Secondary | ICD-10-CM

## 2023-03-06 NOTE — Progress Notes (Signed)
Pt was a no show for apt 

## 2023-03-06 NOTE — Progress Notes (Deleted)
  Cardiology Office Note:  .   Date:  03/06/2023  ID:  [name], DOB 1941/03/18, MRN [id number] PCP: Cleatis Polka., MD  [facility] Providers Cardiologist:  None { Click to update primary MD,subspecialty MD or APP then REFRESH:1}   History of Present Illness: .   [name] is a 82 y.o. male with a past medical history of hypertension, PAF on Eliquis, OSA, GERD, DM 2, hypothyroidism, dementia, dementia, BPH, CKD stage III, hyperlipidemia, Parkinson's disease, bipolar 1 disorder.  02/09/2022 echo EF 60 to 65%, mild LVH, grade 2 DD, mild MR, mild dilatation of the ascending aorta 39 mm 01/03/2022 monitor episodes of atrial fibrillation for approximately 11 minutes 11/17/2021 echo EF 60 to 65%, impaired relaxation, mild AR, trace MR  Most recently evaluated by Dr. Bing Matter on 04/07/2022, he had recently worn a monitor secondary to bradycardia and it was discovered he had atrial fibrillation.  His CHA2DS2-VASc score was 5 and he was started on anticoagulation.  He was evaluated in the emergency department on 02/11/2023 s/p fall, suffering a small contusion to his scalp.  He was discharged and advised to follow-up with his PCP.  Repeat CBC, BMET  ROS: ***  Studies Reviewed: .        *** Risk Assessment/Calculations:   {Does this patient have ATRIAL FIBRILLATION?:[phone]} No BP recorded.  {Refresh Note OR Click here to enter BP  :1}***       Physical Exam:   VS:  There were no vitals taken for this visit.   Wt Readings from Last 3 Encounters:  04/07/22 188 lb 6.4 oz (85.5 kg)  02/03/22 183 lb 3.2 oz (83.1 kg)  01/03/22 184 lb (83.5 kg)    GEN: Well nourished, well developed in no acute distress NECK: No JVD; No carotid bruits CARDIAC: ***RRR, no murmurs, rubs, gallops RESPIRATORY:  Clear to auscultation without rales, wheezing or rhonchi  ABDOMEN: Soft, non-tender, non-distended EXTREMITIES:  No edema; No deformity   ASSESSMENT AND PLAN: .   ***    {Are  you ordering a CV Procedure (e.g. stress test, cath, DCCV, TEE, etc)?   Press F2        :[id number]}  Dispo: ***  Signed, Flossie Dibble, NP

## 2023-03-07 ENCOUNTER — Ambulatory Visit: Payer: Medicare HMO | Attending: Cardiology | Admitting: Cardiology

## 2023-03-07 DIAGNOSIS — R001 Bradycardia, unspecified: Secondary | ICD-10-CM

## 2023-03-07 DIAGNOSIS — D6859 Other primary thrombophilia: Secondary | ICD-10-CM

## 2023-03-07 DIAGNOSIS — I480 Paroxysmal atrial fibrillation: Secondary | ICD-10-CM

## 2023-03-10 DIAGNOSIS — R531 Weakness: Secondary | ICD-10-CM | POA: Diagnosis not present

## 2023-03-10 DIAGNOSIS — R785 Finding of other psychotropic drug in blood: Secondary | ICD-10-CM | POA: Diagnosis not present

## 2023-03-10 DIAGNOSIS — N390 Urinary tract infection, site not specified: Secondary | ICD-10-CM | POA: Diagnosis not present

## 2023-03-10 DIAGNOSIS — B961 Klebsiella pneumoniae [K. pneumoniae] as the cause of diseases classified elsewhere: Secondary | ICD-10-CM | POA: Diagnosis not present

## 2023-03-10 DIAGNOSIS — F319 Bipolar disorder, unspecified: Secondary | ICD-10-CM | POA: Diagnosis not present

## 2023-03-10 DIAGNOSIS — N1831 Chronic kidney disease, stage 3a: Secondary | ICD-10-CM | POA: Diagnosis not present

## 2023-03-10 DIAGNOSIS — Z7401 Bed confinement status: Secondary | ICD-10-CM | POA: Diagnosis not present

## 2023-03-10 DIAGNOSIS — K219 Gastro-esophageal reflux disease without esophagitis: Secondary | ICD-10-CM | POA: Diagnosis not present

## 2023-03-10 DIAGNOSIS — R001 Bradycardia, unspecified: Secondary | ICD-10-CM | POA: Diagnosis not present

## 2023-03-10 DIAGNOSIS — A4189 Other specified sepsis: Secondary | ICD-10-CM | POA: Diagnosis not present

## 2023-03-10 DIAGNOSIS — A419 Sepsis, unspecified organism: Secondary | ICD-10-CM | POA: Diagnosis not present

## 2023-03-10 DIAGNOSIS — J449 Chronic obstructive pulmonary disease, unspecified: Secondary | ICD-10-CM | POA: Diagnosis not present

## 2023-03-10 DIAGNOSIS — N3000 Acute cystitis without hematuria: Secondary | ICD-10-CM | POA: Diagnosis not present

## 2023-03-10 DIAGNOSIS — R6520 Severe sepsis without septic shock: Secondary | ICD-10-CM | POA: Diagnosis not present

## 2023-03-10 DIAGNOSIS — I4510 Unspecified right bundle-branch block: Secondary | ICD-10-CM | POA: Diagnosis not present

## 2023-03-10 DIAGNOSIS — E1122 Type 2 diabetes mellitus with diabetic chronic kidney disease: Secondary | ICD-10-CM | POA: Diagnosis not present

## 2023-03-10 DIAGNOSIS — I672 Cerebral atherosclerosis: Secondary | ICD-10-CM | POA: Diagnosis not present

## 2023-03-10 DIAGNOSIS — I129 Hypertensive chronic kidney disease with stage 1 through stage 4 chronic kidney disease, or unspecified chronic kidney disease: Secondary | ICD-10-CM | POA: Diagnosis not present

## 2023-03-10 DIAGNOSIS — R29810 Facial weakness: Secondary | ICD-10-CM | POA: Diagnosis not present

## 2023-03-10 DIAGNOSIS — I6782 Cerebral ischemia: Secondary | ICD-10-CM | POA: Diagnosis not present

## 2023-03-10 DIAGNOSIS — N400 Enlarged prostate without lower urinary tract symptoms: Secondary | ICD-10-CM | POA: Diagnosis not present

## 2023-03-10 DIAGNOSIS — G4733 Obstructive sleep apnea (adult) (pediatric): Secondary | ICD-10-CM | POA: Diagnosis not present

## 2023-03-10 DIAGNOSIS — G20A1 Parkinson's disease without dyskinesia, without mention of fluctuations: Secondary | ICD-10-CM | POA: Diagnosis not present

## 2023-03-10 DIAGNOSIS — R0689 Other abnormalities of breathing: Secondary | ICD-10-CM | POA: Diagnosis not present

## 2023-03-10 DIAGNOSIS — Z1612 Extended spectrum beta lactamase (ESBL) resistance: Secondary | ICD-10-CM | POA: Diagnosis not present

## 2023-03-10 DIAGNOSIS — E1165 Type 2 diabetes mellitus with hyperglycemia: Secondary | ICD-10-CM | POA: Diagnosis not present

## 2023-03-10 DIAGNOSIS — Z7901 Long term (current) use of anticoagulants: Secondary | ICD-10-CM | POA: Diagnosis not present

## 2023-03-10 DIAGNOSIS — R509 Fever, unspecified: Secondary | ICD-10-CM | POA: Diagnosis not present

## 2023-03-11 DIAGNOSIS — Z1612 Extended spectrum beta lactamase (ESBL) resistance: Secondary | ICD-10-CM | POA: Diagnosis not present

## 2023-03-11 DIAGNOSIS — A4189 Other specified sepsis: Secondary | ICD-10-CM | POA: Diagnosis not present

## 2023-03-11 DIAGNOSIS — N390 Urinary tract infection, site not specified: Secondary | ICD-10-CM | POA: Diagnosis not present

## 2023-03-11 DIAGNOSIS — E1165 Type 2 diabetes mellitus with hyperglycemia: Secondary | ICD-10-CM | POA: Diagnosis not present

## 2023-03-12 DIAGNOSIS — Z1612 Extended spectrum beta lactamase (ESBL) resistance: Secondary | ICD-10-CM | POA: Diagnosis not present

## 2023-03-12 DIAGNOSIS — N390 Urinary tract infection, site not specified: Secondary | ICD-10-CM | POA: Diagnosis not present

## 2023-03-12 DIAGNOSIS — A4189 Other specified sepsis: Secondary | ICD-10-CM | POA: Diagnosis not present

## 2023-03-13 DIAGNOSIS — A4189 Other specified sepsis: Secondary | ICD-10-CM | POA: Diagnosis not present

## 2023-03-13 DIAGNOSIS — N390 Urinary tract infection, site not specified: Secondary | ICD-10-CM | POA: Diagnosis not present

## 2023-03-13 DIAGNOSIS — Z1612 Extended spectrum beta lactamase (ESBL) resistance: Secondary | ICD-10-CM | POA: Diagnosis not present

## 2023-03-16 DIAGNOSIS — I129 Hypertensive chronic kidney disease with stage 1 through stage 4 chronic kidney disease, or unspecified chronic kidney disease: Secondary | ICD-10-CM | POA: Diagnosis not present

## 2023-03-16 DIAGNOSIS — A498 Other bacterial infections of unspecified site: Secondary | ICD-10-CM | POA: Diagnosis not present

## 2023-03-16 DIAGNOSIS — N3000 Acute cystitis without hematuria: Secondary | ICD-10-CM | POA: Diagnosis not present

## 2023-03-16 DIAGNOSIS — F0283 Dementia in other diseases classified elsewhere, unspecified severity, with mood disturbance: Secondary | ICD-10-CM | POA: Diagnosis not present

## 2023-03-16 DIAGNOSIS — J449 Chronic obstructive pulmonary disease, unspecified: Secondary | ICD-10-CM | POA: Diagnosis not present

## 2023-03-16 DIAGNOSIS — G20A1 Parkinson's disease without dyskinesia, without mention of fluctuations: Secondary | ICD-10-CM | POA: Diagnosis not present

## 2023-03-16 DIAGNOSIS — A419 Sepsis, unspecified organism: Secondary | ICD-10-CM | POA: Diagnosis not present

## 2023-03-16 DIAGNOSIS — N184 Chronic kidney disease, stage 4 (severe): Secondary | ICD-10-CM | POA: Diagnosis not present

## 2023-03-16 DIAGNOSIS — E1122 Type 2 diabetes mellitus with diabetic chronic kidney disease: Secondary | ICD-10-CM | POA: Diagnosis not present

## 2023-03-20 DIAGNOSIS — I10 Essential (primary) hypertension: Secondary | ICD-10-CM | POA: Diagnosis not present

## 2023-03-20 DIAGNOSIS — A419 Sepsis, unspecified organism: Secondary | ICD-10-CM | POA: Diagnosis not present

## 2023-03-20 DIAGNOSIS — F0283 Dementia in other diseases classified elsewhere, unspecified severity, with mood disturbance: Secondary | ICD-10-CM | POA: Diagnosis not present

## 2023-03-20 DIAGNOSIS — D518 Other vitamin B12 deficiency anemias: Secondary | ICD-10-CM | POA: Diagnosis not present

## 2023-03-20 DIAGNOSIS — E559 Vitamin D deficiency, unspecified: Secondary | ICD-10-CM | POA: Diagnosis not present

## 2023-03-20 DIAGNOSIS — E1142 Type 2 diabetes mellitus with diabetic polyneuropathy: Secondary | ICD-10-CM | POA: Diagnosis not present

## 2023-03-20 DIAGNOSIS — A498 Other bacterial infections of unspecified site: Secondary | ICD-10-CM | POA: Diagnosis not present

## 2023-03-20 DIAGNOSIS — E782 Mixed hyperlipidemia: Secondary | ICD-10-CM | POA: Diagnosis not present

## 2023-03-20 DIAGNOSIS — N184 Chronic kidney disease, stage 4 (severe): Secondary | ICD-10-CM | POA: Diagnosis not present

## 2023-03-20 DIAGNOSIS — E1122 Type 2 diabetes mellitus with diabetic chronic kidney disease: Secondary | ICD-10-CM | POA: Diagnosis not present

## 2023-03-20 DIAGNOSIS — G20A1 Parkinson's disease without dyskinesia, without mention of fluctuations: Secondary | ICD-10-CM | POA: Diagnosis not present

## 2023-03-20 DIAGNOSIS — N3000 Acute cystitis without hematuria: Secondary | ICD-10-CM | POA: Diagnosis not present

## 2023-03-20 DIAGNOSIS — E038 Other specified hypothyroidism: Secondary | ICD-10-CM | POA: Diagnosis not present

## 2023-03-20 DIAGNOSIS — I129 Hypertensive chronic kidney disease with stage 1 through stage 4 chronic kidney disease, or unspecified chronic kidney disease: Secondary | ICD-10-CM | POA: Diagnosis not present

## 2023-03-20 DIAGNOSIS — J449 Chronic obstructive pulmonary disease, unspecified: Secondary | ICD-10-CM | POA: Diagnosis not present

## 2023-03-20 DIAGNOSIS — E119 Type 2 diabetes mellitus without complications: Secondary | ICD-10-CM | POA: Diagnosis not present

## 2023-03-21 ENCOUNTER — Telehealth: Payer: Self-pay | Admitting: Cardiology

## 2023-03-21 DIAGNOSIS — N3000 Acute cystitis without hematuria: Secondary | ICD-10-CM | POA: Diagnosis not present

## 2023-03-21 DIAGNOSIS — A498 Other bacterial infections of unspecified site: Secondary | ICD-10-CM | POA: Diagnosis not present

## 2023-03-21 DIAGNOSIS — E1122 Type 2 diabetes mellitus with diabetic chronic kidney disease: Secondary | ICD-10-CM | POA: Diagnosis not present

## 2023-03-21 DIAGNOSIS — G20A1 Parkinson's disease without dyskinesia, without mention of fluctuations: Secondary | ICD-10-CM | POA: Diagnosis not present

## 2023-03-21 DIAGNOSIS — N184 Chronic kidney disease, stage 4 (severe): Secondary | ICD-10-CM | POA: Diagnosis not present

## 2023-03-21 DIAGNOSIS — Z79899 Other long term (current) drug therapy: Secondary | ICD-10-CM | POA: Diagnosis not present

## 2023-03-21 DIAGNOSIS — K5900 Constipation, unspecified: Secondary | ICD-10-CM | POA: Diagnosis not present

## 2023-03-21 DIAGNOSIS — A419 Sepsis, unspecified organism: Secondary | ICD-10-CM | POA: Diagnosis not present

## 2023-03-21 DIAGNOSIS — R112 Nausea with vomiting, unspecified: Secondary | ICD-10-CM | POA: Diagnosis not present

## 2023-03-21 DIAGNOSIS — Z7401 Bed confinement status: Secondary | ICD-10-CM | POA: Diagnosis not present

## 2023-03-21 DIAGNOSIS — K5730 Diverticulosis of large intestine without perforation or abscess without bleeding: Secondary | ICD-10-CM | POA: Diagnosis not present

## 2023-03-21 DIAGNOSIS — F0283 Dementia in other diseases classified elsewhere, unspecified severity, with mood disturbance: Secondary | ICD-10-CM | POA: Diagnosis not present

## 2023-03-21 DIAGNOSIS — J449 Chronic obstructive pulmonary disease, unspecified: Secondary | ICD-10-CM | POA: Diagnosis not present

## 2023-03-21 DIAGNOSIS — R110 Nausea: Secondary | ICD-10-CM | POA: Diagnosis not present

## 2023-03-21 DIAGNOSIS — K8689 Other specified diseases of pancreas: Secondary | ICD-10-CM | POA: Diagnosis not present

## 2023-03-21 DIAGNOSIS — R400 Somnolence: Secondary | ICD-10-CM | POA: Diagnosis not present

## 2023-03-21 DIAGNOSIS — I129 Hypertensive chronic kidney disease with stage 1 through stage 4 chronic kidney disease, or unspecified chronic kidney disease: Secondary | ICD-10-CM | POA: Diagnosis not present

## 2023-03-21 NOTE — Telephone Encounter (Signed)
Patient calling the office for samples of medication:   1.  What medication and dosage are you requesting samples for? Ekiquis  2.  Are you currently out of this medication?  yes

## 2023-03-21 NOTE — Telephone Encounter (Signed)
Pt c/o medication issue:  1. Name of Medication:   apixaban (ELIQUIS) 5 MG TABS tablet   2. How are you currently taking this medication (dosage and times per day)?  As prescribed.  3. Are you having a reaction (difficulty breathing--STAT)?   4. What is your medication issue?   Wife stated patient is in the donut hole with this medication and wants to get samples.

## 2023-03-21 NOTE — Telephone Encounter (Signed)
Pts sig other stated that the pt was out of Eliquis and could not afford it. We are out of samples.

## 2023-03-21 NOTE — Telephone Encounter (Signed)
LM to return my call. 

## 2023-03-21 NOTE — Telephone Encounter (Signed)
Pt's granddaughter calling back to the phone note on 8/7. Please advise

## 2023-03-22 ENCOUNTER — Telehealth: Payer: Self-pay

## 2023-03-22 DIAGNOSIS — F0283 Dementia in other diseases classified elsewhere, unspecified severity, with mood disturbance: Secondary | ICD-10-CM | POA: Diagnosis not present

## 2023-03-22 DIAGNOSIS — A498 Other bacterial infections of unspecified site: Secondary | ICD-10-CM | POA: Diagnosis not present

## 2023-03-22 DIAGNOSIS — I129 Hypertensive chronic kidney disease with stage 1 through stage 4 chronic kidney disease, or unspecified chronic kidney disease: Secondary | ICD-10-CM | POA: Diagnosis not present

## 2023-03-22 DIAGNOSIS — G20A1 Parkinson's disease without dyskinesia, without mention of fluctuations: Secondary | ICD-10-CM | POA: Diagnosis not present

## 2023-03-22 DIAGNOSIS — N184 Chronic kidney disease, stage 4 (severe): Secondary | ICD-10-CM | POA: Diagnosis not present

## 2023-03-22 DIAGNOSIS — A419 Sepsis, unspecified organism: Secondary | ICD-10-CM | POA: Diagnosis not present

## 2023-03-22 DIAGNOSIS — E1122 Type 2 diabetes mellitus with diabetic chronic kidney disease: Secondary | ICD-10-CM | POA: Diagnosis not present

## 2023-03-22 DIAGNOSIS — N3000 Acute cystitis without hematuria: Secondary | ICD-10-CM | POA: Diagnosis not present

## 2023-03-22 DIAGNOSIS — J449 Chronic obstructive pulmonary disease, unspecified: Secondary | ICD-10-CM | POA: Diagnosis not present

## 2023-03-22 NOTE — Telephone Encounter (Signed)
[name] called stating that the Eliquis had been approved and she would be bringing paperwork by office.

## 2023-03-22 NOTE — Telephone Encounter (Signed)
BMS application completed and faxed.

## 2023-03-27 DIAGNOSIS — R400 Somnolence: Secondary | ICD-10-CM | POA: Diagnosis not present

## 2023-03-27 DIAGNOSIS — J449 Chronic obstructive pulmonary disease, unspecified: Secondary | ICD-10-CM | POA: Diagnosis not present

## 2023-03-27 DIAGNOSIS — A498 Other bacterial infections of unspecified site: Secondary | ICD-10-CM | POA: Diagnosis not present

## 2023-03-27 DIAGNOSIS — R110 Nausea: Secondary | ICD-10-CM | POA: Diagnosis not present

## 2023-03-27 DIAGNOSIS — G20A1 Parkinson's disease without dyskinesia, without mention of fluctuations: Secondary | ICD-10-CM | POA: Diagnosis not present

## 2023-03-27 DIAGNOSIS — N184 Chronic kidney disease, stage 4 (severe): Secondary | ICD-10-CM | POA: Diagnosis not present

## 2023-03-27 DIAGNOSIS — E1122 Type 2 diabetes mellitus with diabetic chronic kidney disease: Secondary | ICD-10-CM | POA: Diagnosis not present

## 2023-03-27 DIAGNOSIS — I129 Hypertensive chronic kidney disease with stage 1 through stage 4 chronic kidney disease, or unspecified chronic kidney disease: Secondary | ICD-10-CM | POA: Diagnosis not present

## 2023-03-27 DIAGNOSIS — I10 Essential (primary) hypertension: Secondary | ICD-10-CM | POA: Diagnosis not present

## 2023-03-27 DIAGNOSIS — F0283 Dementia in other diseases classified elsewhere, unspecified severity, with mood disturbance: Secondary | ICD-10-CM | POA: Diagnosis not present

## 2023-03-27 DIAGNOSIS — N3000 Acute cystitis without hematuria: Secondary | ICD-10-CM | POA: Diagnosis not present

## 2023-03-27 DIAGNOSIS — A419 Sepsis, unspecified organism: Secondary | ICD-10-CM | POA: Diagnosis not present

## 2023-03-28 DIAGNOSIS — J449 Chronic obstructive pulmonary disease, unspecified: Secondary | ICD-10-CM | POA: Diagnosis not present

## 2023-03-28 DIAGNOSIS — F0283 Dementia in other diseases classified elsewhere, unspecified severity, with mood disturbance: Secondary | ICD-10-CM | POA: Diagnosis not present

## 2023-03-28 DIAGNOSIS — I129 Hypertensive chronic kidney disease with stage 1 through stage 4 chronic kidney disease, or unspecified chronic kidney disease: Secondary | ICD-10-CM | POA: Diagnosis not present

## 2023-03-28 DIAGNOSIS — G20A1 Parkinson's disease without dyskinesia, without mention of fluctuations: Secondary | ICD-10-CM | POA: Diagnosis not present

## 2023-03-28 DIAGNOSIS — E1122 Type 2 diabetes mellitus with diabetic chronic kidney disease: Secondary | ICD-10-CM | POA: Diagnosis not present

## 2023-03-28 DIAGNOSIS — A419 Sepsis, unspecified organism: Secondary | ICD-10-CM | POA: Diagnosis not present

## 2023-03-28 DIAGNOSIS — A498 Other bacterial infections of unspecified site: Secondary | ICD-10-CM | POA: Diagnosis not present

## 2023-03-28 DIAGNOSIS — N184 Chronic kidney disease, stage 4 (severe): Secondary | ICD-10-CM | POA: Diagnosis not present

## 2023-03-28 DIAGNOSIS — N3000 Acute cystitis without hematuria: Secondary | ICD-10-CM | POA: Diagnosis not present

## 2023-04-03 NOTE — Telephone Encounter (Signed)
Received fax from [facility] stating we needed to indicate if the patient is being treated as inpatient or outpatient.

## 2023-04-03 NOTE — Telephone Encounter (Signed)
Application updated and faxed.

## 2023-04-05 DIAGNOSIS — N3000 Acute cystitis without hematuria: Secondary | ICD-10-CM | POA: Diagnosis not present

## 2023-04-05 DIAGNOSIS — E1122 Type 2 diabetes mellitus with diabetic chronic kidney disease: Secondary | ICD-10-CM | POA: Diagnosis not present

## 2023-04-05 DIAGNOSIS — I129 Hypertensive chronic kidney disease with stage 1 through stage 4 chronic kidney disease, or unspecified chronic kidney disease: Secondary | ICD-10-CM | POA: Diagnosis not present

## 2023-04-05 DIAGNOSIS — N184 Chronic kidney disease, stage 4 (severe): Secondary | ICD-10-CM | POA: Diagnosis not present

## 2023-04-05 DIAGNOSIS — F0283 Dementia in other diseases classified elsewhere, unspecified severity, with mood disturbance: Secondary | ICD-10-CM | POA: Diagnosis not present

## 2023-04-05 DIAGNOSIS — A498 Other bacterial infections of unspecified site: Secondary | ICD-10-CM | POA: Diagnosis not present

## 2023-04-05 DIAGNOSIS — G20A1 Parkinson's disease without dyskinesia, without mention of fluctuations: Secondary | ICD-10-CM | POA: Diagnosis not present

## 2023-04-05 DIAGNOSIS — A419 Sepsis, unspecified organism: Secondary | ICD-10-CM | POA: Diagnosis not present

## 2023-04-05 DIAGNOSIS — J449 Chronic obstructive pulmonary disease, unspecified: Secondary | ICD-10-CM | POA: Diagnosis not present

## 2023-04-06 DIAGNOSIS — A419 Sepsis, unspecified organism: Secondary | ICD-10-CM | POA: Diagnosis not present

## 2023-04-06 DIAGNOSIS — I129 Hypertensive chronic kidney disease with stage 1 through stage 4 chronic kidney disease, or unspecified chronic kidney disease: Secondary | ICD-10-CM | POA: Diagnosis not present

## 2023-04-06 DIAGNOSIS — N184 Chronic kidney disease, stage 4 (severe): Secondary | ICD-10-CM | POA: Diagnosis not present

## 2023-04-06 DIAGNOSIS — F0283 Dementia in other diseases classified elsewhere, unspecified severity, with mood disturbance: Secondary | ICD-10-CM | POA: Diagnosis not present

## 2023-04-06 DIAGNOSIS — G20A1 Parkinson's disease without dyskinesia, without mention of fluctuations: Secondary | ICD-10-CM | POA: Diagnosis not present

## 2023-04-06 DIAGNOSIS — J449 Chronic obstructive pulmonary disease, unspecified: Secondary | ICD-10-CM | POA: Diagnosis not present

## 2023-04-06 DIAGNOSIS — A498 Other bacterial infections of unspecified site: Secondary | ICD-10-CM | POA: Diagnosis not present

## 2023-04-06 DIAGNOSIS — E1122 Type 2 diabetes mellitus with diabetic chronic kidney disease: Secondary | ICD-10-CM | POA: Diagnosis not present

## 2023-04-06 DIAGNOSIS — N3000 Acute cystitis without hematuria: Secondary | ICD-10-CM | POA: Diagnosis not present

## 2023-04-11 ENCOUNTER — Telehealth: Payer: Self-pay

## 2023-04-11 DIAGNOSIS — R419 Unspecified symptoms and signs involving cognitive functions and awareness: Secondary | ICD-10-CM | POA: Diagnosis not present

## 2023-04-11 DIAGNOSIS — J449 Chronic obstructive pulmonary disease, unspecified: Secondary | ICD-10-CM | POA: Diagnosis not present

## 2023-04-11 DIAGNOSIS — E1122 Type 2 diabetes mellitus with diabetic chronic kidney disease: Secondary | ICD-10-CM | POA: Diagnosis not present

## 2023-04-11 DIAGNOSIS — A419 Sepsis, unspecified organism: Secondary | ICD-10-CM | POA: Diagnosis not present

## 2023-04-11 DIAGNOSIS — G20A2 Parkinson's disease without dyskinesia, with fluctuations: Secondary | ICD-10-CM | POA: Diagnosis not present

## 2023-04-11 DIAGNOSIS — N184 Chronic kidney disease, stage 4 (severe): Secondary | ICD-10-CM | POA: Diagnosis not present

## 2023-04-11 DIAGNOSIS — N3000 Acute cystitis without hematuria: Secondary | ICD-10-CM | POA: Diagnosis not present

## 2023-04-11 DIAGNOSIS — I129 Hypertensive chronic kidney disease with stage 1 through stage 4 chronic kidney disease, or unspecified chronic kidney disease: Secondary | ICD-10-CM | POA: Diagnosis not present

## 2023-04-11 DIAGNOSIS — F0283 Dementia in other diseases classified elsewhere, unspecified severity, with mood disturbance: Secondary | ICD-10-CM | POA: Diagnosis not present

## 2023-04-11 DIAGNOSIS — G20A1 Parkinson's disease without dyskinesia, without mention of fluctuations: Secondary | ICD-10-CM | POA: Diagnosis not present

## 2023-04-11 DIAGNOSIS — A498 Other bacterial infections of unspecified site: Secondary | ICD-10-CM | POA: Diagnosis not present

## 2023-04-11 DIAGNOSIS — M6281 Muscle weakness (generalized): Secondary | ICD-10-CM | POA: Diagnosis not present

## 2023-04-11 NOTE — Telephone Encounter (Signed)
Spoke to [name] notified patient does not qualify for assistance unless he pay 1,133.30 OOP on prescriptions. The patient insurance does pay for all his meds except Eliquis so the patient has not had to pay for anything. [name] is requesting med change. Message sent to Dr. Kirtland Bouchard

## 2023-04-12 DIAGNOSIS — E1165 Type 2 diabetes mellitus with hyperglycemia: Secondary | ICD-10-CM | POA: Diagnosis not present

## 2023-04-12 NOTE — Telephone Encounter (Signed)
Spoke to [place], advised of the following recommendations per Dr. Kirtland Bouchard. She will message me back with her final decision.

## 2023-04-18 DIAGNOSIS — G20A1 Parkinson's disease without dyskinesia, without mention of fluctuations: Secondary | ICD-10-CM | POA: Diagnosis not present

## 2023-04-18 DIAGNOSIS — J449 Chronic obstructive pulmonary disease, unspecified: Secondary | ICD-10-CM | POA: Diagnosis not present

## 2023-04-18 DIAGNOSIS — N3000 Acute cystitis without hematuria: Secondary | ICD-10-CM | POA: Diagnosis not present

## 2023-04-18 DIAGNOSIS — A498 Other bacterial infections of unspecified site: Secondary | ICD-10-CM | POA: Diagnosis not present

## 2023-04-18 DIAGNOSIS — A419 Sepsis, unspecified organism: Secondary | ICD-10-CM | POA: Diagnosis not present

## 2023-04-18 DIAGNOSIS — I129 Hypertensive chronic kidney disease with stage 1 through stage 4 chronic kidney disease, or unspecified chronic kidney disease: Secondary | ICD-10-CM | POA: Diagnosis not present

## 2023-04-18 DIAGNOSIS — E1122 Type 2 diabetes mellitus with diabetic chronic kidney disease: Secondary | ICD-10-CM | POA: Diagnosis not present

## 2023-04-18 DIAGNOSIS — F0283 Dementia in other diseases classified elsewhere, unspecified severity, with mood disturbance: Secondary | ICD-10-CM | POA: Diagnosis not present

## 2023-04-18 DIAGNOSIS — N184 Chronic kidney disease, stage 4 (severe): Secondary | ICD-10-CM | POA: Diagnosis not present

## 2023-04-25 DIAGNOSIS — G20A1 Parkinson's disease without dyskinesia, without mention of fluctuations: Secondary | ICD-10-CM | POA: Diagnosis not present

## 2023-04-25 DIAGNOSIS — N3000 Acute cystitis without hematuria: Secondary | ICD-10-CM | POA: Diagnosis not present

## 2023-04-25 DIAGNOSIS — A419 Sepsis, unspecified organism: Secondary | ICD-10-CM | POA: Diagnosis not present

## 2023-04-25 DIAGNOSIS — I129 Hypertensive chronic kidney disease with stage 1 through stage 4 chronic kidney disease, or unspecified chronic kidney disease: Secondary | ICD-10-CM | POA: Diagnosis not present

## 2023-04-25 DIAGNOSIS — N184 Chronic kidney disease, stage 4 (severe): Secondary | ICD-10-CM | POA: Diagnosis not present

## 2023-04-25 DIAGNOSIS — J449 Chronic obstructive pulmonary disease, unspecified: Secondary | ICD-10-CM | POA: Diagnosis not present

## 2023-04-25 DIAGNOSIS — E1122 Type 2 diabetes mellitus with diabetic chronic kidney disease: Secondary | ICD-10-CM | POA: Diagnosis not present

## 2023-04-25 DIAGNOSIS — A498 Other bacterial infections of unspecified site: Secondary | ICD-10-CM | POA: Diagnosis not present

## 2023-04-25 DIAGNOSIS — F0283 Dementia in other diseases classified elsewhere, unspecified severity, with mood disturbance: Secondary | ICD-10-CM | POA: Diagnosis not present

## 2023-04-30 DIAGNOSIS — I129 Hypertensive chronic kidney disease with stage 1 through stage 4 chronic kidney disease, or unspecified chronic kidney disease: Secondary | ICD-10-CM | POA: Diagnosis not present

## 2023-04-30 DIAGNOSIS — N184 Chronic kidney disease, stage 4 (severe): Secondary | ICD-10-CM | POA: Diagnosis not present

## 2023-04-30 DIAGNOSIS — N3000 Acute cystitis without hematuria: Secondary | ICD-10-CM | POA: Diagnosis not present

## 2023-04-30 DIAGNOSIS — G20A1 Parkinson's disease without dyskinesia, without mention of fluctuations: Secondary | ICD-10-CM | POA: Diagnosis not present

## 2023-04-30 DIAGNOSIS — A419 Sepsis, unspecified organism: Secondary | ICD-10-CM | POA: Diagnosis not present

## 2023-04-30 DIAGNOSIS — E1122 Type 2 diabetes mellitus with diabetic chronic kidney disease: Secondary | ICD-10-CM | POA: Diagnosis not present

## 2023-04-30 DIAGNOSIS — J449 Chronic obstructive pulmonary disease, unspecified: Secondary | ICD-10-CM | POA: Diagnosis not present

## 2023-04-30 DIAGNOSIS — F0283 Dementia in other diseases classified elsewhere, unspecified severity, with mood disturbance: Secondary | ICD-10-CM | POA: Diagnosis not present

## 2023-04-30 DIAGNOSIS — A498 Other bacterial infections of unspecified site: Secondary | ICD-10-CM | POA: Diagnosis not present

## 2023-05-10 DIAGNOSIS — J449 Chronic obstructive pulmonary disease, unspecified: Secondary | ICD-10-CM | POA: Diagnosis not present

## 2023-05-10 DIAGNOSIS — F0283 Dementia in other diseases classified elsewhere, unspecified severity, with mood disturbance: Secondary | ICD-10-CM | POA: Diagnosis not present

## 2023-05-10 DIAGNOSIS — A419 Sepsis, unspecified organism: Secondary | ICD-10-CM | POA: Diagnosis not present

## 2023-05-10 DIAGNOSIS — N3000 Acute cystitis without hematuria: Secondary | ICD-10-CM | POA: Diagnosis not present

## 2023-05-10 DIAGNOSIS — I129 Hypertensive chronic kidney disease with stage 1 through stage 4 chronic kidney disease, or unspecified chronic kidney disease: Secondary | ICD-10-CM | POA: Diagnosis not present

## 2023-05-10 DIAGNOSIS — N184 Chronic kidney disease, stage 4 (severe): Secondary | ICD-10-CM | POA: Diagnosis not present

## 2023-05-10 DIAGNOSIS — E1122 Type 2 diabetes mellitus with diabetic chronic kidney disease: Secondary | ICD-10-CM | POA: Diagnosis not present

## 2023-05-10 DIAGNOSIS — G20A1 Parkinson's disease without dyskinesia, without mention of fluctuations: Secondary | ICD-10-CM | POA: Diagnosis not present

## 2023-05-10 DIAGNOSIS — A498 Other bacterial infections of unspecified site: Secondary | ICD-10-CM | POA: Diagnosis not present

## 2023-05-11 DIAGNOSIS — G20A1 Parkinson's disease without dyskinesia, without mention of fluctuations: Secondary | ICD-10-CM | POA: Diagnosis not present

## 2023-05-11 DIAGNOSIS — J449 Chronic obstructive pulmonary disease, unspecified: Secondary | ICD-10-CM | POA: Diagnosis not present

## 2023-05-11 DIAGNOSIS — I129 Hypertensive chronic kidney disease with stage 1 through stage 4 chronic kidney disease, or unspecified chronic kidney disease: Secondary | ICD-10-CM | POA: Diagnosis not present

## 2023-05-11 DIAGNOSIS — N184 Chronic kidney disease, stage 4 (severe): Secondary | ICD-10-CM | POA: Diagnosis not present

## 2023-05-11 DIAGNOSIS — E1122 Type 2 diabetes mellitus with diabetic chronic kidney disease: Secondary | ICD-10-CM | POA: Diagnosis not present

## 2023-05-11 DIAGNOSIS — A498 Other bacterial infections of unspecified site: Secondary | ICD-10-CM | POA: Diagnosis not present

## 2023-05-11 DIAGNOSIS — F0283 Dementia in other diseases classified elsewhere, unspecified severity, with mood disturbance: Secondary | ICD-10-CM | POA: Diagnosis not present

## 2023-05-11 DIAGNOSIS — A419 Sepsis, unspecified organism: Secondary | ICD-10-CM | POA: Diagnosis not present

## 2023-05-11 DIAGNOSIS — N3000 Acute cystitis without hematuria: Secondary | ICD-10-CM | POA: Diagnosis not present

## 2023-05-18 DIAGNOSIS — J449 Chronic obstructive pulmonary disease, unspecified: Secondary | ICD-10-CM | POA: Diagnosis not present

## 2023-05-18 DIAGNOSIS — G20A1 Parkinson's disease without dyskinesia, without mention of fluctuations: Secondary | ICD-10-CM | POA: Diagnosis not present

## 2023-05-18 DIAGNOSIS — F0284 Dementia in other diseases classified elsewhere, unspecified severity, with anxiety: Secondary | ICD-10-CM | POA: Diagnosis not present

## 2023-05-18 DIAGNOSIS — F02818 Dementia in other diseases classified elsewhere, unspecified severity, with other behavioral disturbance: Secondary | ICD-10-CM | POA: Diagnosis not present

## 2023-05-18 DIAGNOSIS — F0283 Dementia in other diseases classified elsewhere, unspecified severity, with mood disturbance: Secondary | ICD-10-CM | POA: Diagnosis not present

## 2023-05-18 DIAGNOSIS — E1122 Type 2 diabetes mellitus with diabetic chronic kidney disease: Secondary | ICD-10-CM | POA: Diagnosis not present

## 2023-05-18 DIAGNOSIS — F319 Bipolar disorder, unspecified: Secondary | ICD-10-CM | POA: Diagnosis not present

## 2023-05-18 DIAGNOSIS — I129 Hypertensive chronic kidney disease with stage 1 through stage 4 chronic kidney disease, or unspecified chronic kidney disease: Secondary | ICD-10-CM | POA: Diagnosis not present

## 2023-05-18 DIAGNOSIS — N184 Chronic kidney disease, stage 4 (severe): Secondary | ICD-10-CM | POA: Diagnosis not present

## 2023-05-24 DIAGNOSIS — F0284 Dementia in other diseases classified elsewhere, unspecified severity, with anxiety: Secondary | ICD-10-CM | POA: Diagnosis not present

## 2023-05-24 DIAGNOSIS — F02818 Dementia in other diseases classified elsewhere, unspecified severity, with other behavioral disturbance: Secondary | ICD-10-CM | POA: Diagnosis not present

## 2023-05-24 DIAGNOSIS — N184 Chronic kidney disease, stage 4 (severe): Secondary | ICD-10-CM | POA: Diagnosis not present

## 2023-05-24 DIAGNOSIS — F319 Bipolar disorder, unspecified: Secondary | ICD-10-CM | POA: Diagnosis not present

## 2023-05-24 DIAGNOSIS — E1122 Type 2 diabetes mellitus with diabetic chronic kidney disease: Secondary | ICD-10-CM | POA: Diagnosis not present

## 2023-05-24 DIAGNOSIS — G20A1 Parkinson's disease without dyskinesia, without mention of fluctuations: Secondary | ICD-10-CM | POA: Diagnosis not present

## 2023-05-24 DIAGNOSIS — I129 Hypertensive chronic kidney disease with stage 1 through stage 4 chronic kidney disease, or unspecified chronic kidney disease: Secondary | ICD-10-CM | POA: Diagnosis not present

## 2023-05-24 DIAGNOSIS — J449 Chronic obstructive pulmonary disease, unspecified: Secondary | ICD-10-CM | POA: Diagnosis not present

## 2023-05-24 DIAGNOSIS — F0283 Dementia in other diseases classified elsewhere, unspecified severity, with mood disturbance: Secondary | ICD-10-CM | POA: Diagnosis not present

## 2023-05-31 DIAGNOSIS — F02818 Dementia in other diseases classified elsewhere, unspecified severity, with other behavioral disturbance: Secondary | ICD-10-CM | POA: Diagnosis not present

## 2023-05-31 DIAGNOSIS — J449 Chronic obstructive pulmonary disease, unspecified: Secondary | ICD-10-CM | POA: Diagnosis not present

## 2023-05-31 DIAGNOSIS — G20A1 Parkinson's disease without dyskinesia, without mention of fluctuations: Secondary | ICD-10-CM | POA: Diagnosis not present

## 2023-05-31 DIAGNOSIS — F0283 Dementia in other diseases classified elsewhere, unspecified severity, with mood disturbance: Secondary | ICD-10-CM | POA: Diagnosis not present

## 2023-05-31 DIAGNOSIS — N184 Chronic kidney disease, stage 4 (severe): Secondary | ICD-10-CM | POA: Diagnosis not present

## 2023-05-31 DIAGNOSIS — F319 Bipolar disorder, unspecified: Secondary | ICD-10-CM | POA: Diagnosis not present

## 2023-05-31 DIAGNOSIS — F0284 Dementia in other diseases classified elsewhere, unspecified severity, with anxiety: Secondary | ICD-10-CM | POA: Diagnosis not present

## 2023-05-31 DIAGNOSIS — I129 Hypertensive chronic kidney disease with stage 1 through stage 4 chronic kidney disease, or unspecified chronic kidney disease: Secondary | ICD-10-CM | POA: Diagnosis not present

## 2023-05-31 DIAGNOSIS — E1122 Type 2 diabetes mellitus with diabetic chronic kidney disease: Secondary | ICD-10-CM | POA: Diagnosis not present

## 2023-06-16 DIAGNOSIS — F0284 Dementia in other diseases classified elsewhere, unspecified severity, with anxiety: Secondary | ICD-10-CM | POA: Diagnosis not present

## 2023-06-16 DIAGNOSIS — J449 Chronic obstructive pulmonary disease, unspecified: Secondary | ICD-10-CM | POA: Diagnosis not present

## 2023-06-16 DIAGNOSIS — F02818 Dementia in other diseases classified elsewhere, unspecified severity, with other behavioral disturbance: Secondary | ICD-10-CM | POA: Diagnosis not present

## 2023-06-16 DIAGNOSIS — N184 Chronic kidney disease, stage 4 (severe): Secondary | ICD-10-CM | POA: Diagnosis not present

## 2023-06-16 DIAGNOSIS — I129 Hypertensive chronic kidney disease with stage 1 through stage 4 chronic kidney disease, or unspecified chronic kidney disease: Secondary | ICD-10-CM | POA: Diagnosis not present

## 2023-06-16 DIAGNOSIS — E1122 Type 2 diabetes mellitus with diabetic chronic kidney disease: Secondary | ICD-10-CM | POA: Diagnosis not present

## 2023-06-16 DIAGNOSIS — F319 Bipolar disorder, unspecified: Secondary | ICD-10-CM | POA: Diagnosis not present

## 2023-06-16 DIAGNOSIS — G20A1 Parkinson's disease without dyskinesia, without mention of fluctuations: Secondary | ICD-10-CM | POA: Diagnosis not present

## 2023-06-16 DIAGNOSIS — F0283 Dementia in other diseases classified elsewhere, unspecified severity, with mood disturbance: Secondary | ICD-10-CM | POA: Diagnosis not present

## 2023-06-29 DIAGNOSIS — I129 Hypertensive chronic kidney disease with stage 1 through stage 4 chronic kidney disease, or unspecified chronic kidney disease: Secondary | ICD-10-CM | POA: Diagnosis not present

## 2023-06-29 DIAGNOSIS — J449 Chronic obstructive pulmonary disease, unspecified: Secondary | ICD-10-CM | POA: Diagnosis not present

## 2023-06-29 DIAGNOSIS — F0284 Dementia in other diseases classified elsewhere, unspecified severity, with anxiety: Secondary | ICD-10-CM | POA: Diagnosis not present

## 2023-06-29 DIAGNOSIS — N184 Chronic kidney disease, stage 4 (severe): Secondary | ICD-10-CM | POA: Diagnosis not present

## 2023-06-29 DIAGNOSIS — G20A1 Parkinson's disease without dyskinesia, without mention of fluctuations: Secondary | ICD-10-CM | POA: Diagnosis not present

## 2023-06-29 DIAGNOSIS — F319 Bipolar disorder, unspecified: Secondary | ICD-10-CM | POA: Diagnosis not present

## 2023-06-29 DIAGNOSIS — E1122 Type 2 diabetes mellitus with diabetic chronic kidney disease: Secondary | ICD-10-CM | POA: Diagnosis not present

## 2023-06-29 DIAGNOSIS — F02818 Dementia in other diseases classified elsewhere, unspecified severity, with other behavioral disturbance: Secondary | ICD-10-CM | POA: Diagnosis not present

## 2023-06-29 DIAGNOSIS — F0283 Dementia in other diseases classified elsewhere, unspecified severity, with mood disturbance: Secondary | ICD-10-CM | POA: Diagnosis not present

## 2023-07-13 DIAGNOSIS — F0283 Dementia in other diseases classified elsewhere, unspecified severity, with mood disturbance: Secondary | ICD-10-CM | POA: Diagnosis not present

## 2023-07-13 DIAGNOSIS — J449 Chronic obstructive pulmonary disease, unspecified: Secondary | ICD-10-CM | POA: Diagnosis not present

## 2023-07-13 DIAGNOSIS — E1122 Type 2 diabetes mellitus with diabetic chronic kidney disease: Secondary | ICD-10-CM | POA: Diagnosis not present

## 2023-07-13 DIAGNOSIS — N184 Chronic kidney disease, stage 4 (severe): Secondary | ICD-10-CM | POA: Diagnosis not present

## 2023-07-13 DIAGNOSIS — I129 Hypertensive chronic kidney disease with stage 1 through stage 4 chronic kidney disease, or unspecified chronic kidney disease: Secondary | ICD-10-CM | POA: Diagnosis not present

## 2023-07-13 DIAGNOSIS — F0284 Dementia in other diseases classified elsewhere, unspecified severity, with anxiety: Secondary | ICD-10-CM | POA: Diagnosis not present

## 2023-07-13 DIAGNOSIS — F02818 Dementia in other diseases classified elsewhere, unspecified severity, with other behavioral disturbance: Secondary | ICD-10-CM | POA: Diagnosis not present

## 2023-07-13 DIAGNOSIS — G20A1 Parkinson's disease without dyskinesia, without mention of fluctuations: Secondary | ICD-10-CM | POA: Diagnosis not present

## 2023-07-13 DIAGNOSIS — F319 Bipolar disorder, unspecified: Secondary | ICD-10-CM | POA: Diagnosis not present

## 2023-07-21 DIAGNOSIS — F0283 Dementia in other diseases classified elsewhere, unspecified severity, with mood disturbance: Secondary | ICD-10-CM | POA: Diagnosis not present

## 2023-07-21 DIAGNOSIS — I129 Hypertensive chronic kidney disease with stage 1 through stage 4 chronic kidney disease, or unspecified chronic kidney disease: Secondary | ICD-10-CM | POA: Diagnosis not present

## 2023-07-21 DIAGNOSIS — F319 Bipolar disorder, unspecified: Secondary | ICD-10-CM | POA: Diagnosis not present

## 2023-07-21 DIAGNOSIS — J449 Chronic obstructive pulmonary disease, unspecified: Secondary | ICD-10-CM | POA: Diagnosis not present

## 2023-07-21 DIAGNOSIS — E1122 Type 2 diabetes mellitus with diabetic chronic kidney disease: Secondary | ICD-10-CM | POA: Diagnosis not present

## 2023-07-21 DIAGNOSIS — Z51A Encounter for sepsis aftercare: Secondary | ICD-10-CM | POA: Diagnosis not present

## 2023-07-21 DIAGNOSIS — F0284 Dementia in other diseases classified elsewhere, unspecified severity, with anxiety: Secondary | ICD-10-CM | POA: Diagnosis not present

## 2023-07-21 DIAGNOSIS — N184 Chronic kidney disease, stage 4 (severe): Secondary | ICD-10-CM | POA: Diagnosis not present

## 2023-07-21 DIAGNOSIS — G20A1 Parkinson's disease without dyskinesia, without mention of fluctuations: Secondary | ICD-10-CM | POA: Diagnosis not present

## 2023-07-23 DIAGNOSIS — F0283 Dementia in other diseases classified elsewhere, unspecified severity, with mood disturbance: Secondary | ICD-10-CM | POA: Diagnosis not present

## 2023-07-23 DIAGNOSIS — F319 Bipolar disorder, unspecified: Secondary | ICD-10-CM | POA: Diagnosis not present

## 2023-07-23 DIAGNOSIS — J449 Chronic obstructive pulmonary disease, unspecified: Secondary | ICD-10-CM | POA: Diagnosis not present

## 2023-07-23 DIAGNOSIS — G20A1 Parkinson's disease without dyskinesia, without mention of fluctuations: Secondary | ICD-10-CM | POA: Diagnosis not present

## 2023-07-23 DIAGNOSIS — E1122 Type 2 diabetes mellitus with diabetic chronic kidney disease: Secondary | ICD-10-CM | POA: Diagnosis not present

## 2023-07-23 DIAGNOSIS — N184 Chronic kidney disease, stage 4 (severe): Secondary | ICD-10-CM | POA: Diagnosis not present

## 2023-07-23 DIAGNOSIS — Z51A Encounter for sepsis aftercare: Secondary | ICD-10-CM | POA: Diagnosis not present

## 2023-07-23 DIAGNOSIS — F0284 Dementia in other diseases classified elsewhere, unspecified severity, with anxiety: Secondary | ICD-10-CM | POA: Diagnosis not present

## 2023-07-23 DIAGNOSIS — I129 Hypertensive chronic kidney disease with stage 1 through stage 4 chronic kidney disease, or unspecified chronic kidney disease: Secondary | ICD-10-CM | POA: Diagnosis not present

## 2023-07-30 DIAGNOSIS — F0283 Dementia in other diseases classified elsewhere, unspecified severity, with mood disturbance: Secondary | ICD-10-CM | POA: Diagnosis not present

## 2023-07-30 DIAGNOSIS — N184 Chronic kidney disease, stage 4 (severe): Secondary | ICD-10-CM | POA: Diagnosis not present

## 2023-07-30 DIAGNOSIS — J449 Chronic obstructive pulmonary disease, unspecified: Secondary | ICD-10-CM | POA: Diagnosis not present

## 2023-07-30 DIAGNOSIS — Z51A Encounter for sepsis aftercare: Secondary | ICD-10-CM | POA: Diagnosis not present

## 2023-07-30 DIAGNOSIS — E1122 Type 2 diabetes mellitus with diabetic chronic kidney disease: Secondary | ICD-10-CM | POA: Diagnosis not present

## 2023-07-30 DIAGNOSIS — F0284 Dementia in other diseases classified elsewhere, unspecified severity, with anxiety: Secondary | ICD-10-CM | POA: Diagnosis not present

## 2023-07-30 DIAGNOSIS — I129 Hypertensive chronic kidney disease with stage 1 through stage 4 chronic kidney disease, or unspecified chronic kidney disease: Secondary | ICD-10-CM | POA: Diagnosis not present

## 2023-07-30 DIAGNOSIS — F319 Bipolar disorder, unspecified: Secondary | ICD-10-CM | POA: Diagnosis not present

## 2023-07-30 DIAGNOSIS — G20A1 Parkinson's disease without dyskinesia, without mention of fluctuations: Secondary | ICD-10-CM | POA: Diagnosis not present

## 2023-08-13 DIAGNOSIS — F0284 Dementia in other diseases classified elsewhere, unspecified severity, with anxiety: Secondary | ICD-10-CM | POA: Diagnosis not present

## 2023-08-13 DIAGNOSIS — E1122 Type 2 diabetes mellitus with diabetic chronic kidney disease: Secondary | ICD-10-CM | POA: Diagnosis not present

## 2023-08-13 DIAGNOSIS — G20A1 Parkinson's disease without dyskinesia, without mention of fluctuations: Secondary | ICD-10-CM | POA: Diagnosis not present

## 2023-08-13 DIAGNOSIS — Z51A Encounter for sepsis aftercare: Secondary | ICD-10-CM | POA: Diagnosis not present

## 2023-08-13 DIAGNOSIS — J449 Chronic obstructive pulmonary disease, unspecified: Secondary | ICD-10-CM | POA: Diagnosis not present

## 2023-08-13 DIAGNOSIS — F0283 Dementia in other diseases classified elsewhere, unspecified severity, with mood disturbance: Secondary | ICD-10-CM | POA: Diagnosis not present

## 2023-08-13 DIAGNOSIS — N184 Chronic kidney disease, stage 4 (severe): Secondary | ICD-10-CM | POA: Diagnosis not present

## 2023-08-13 DIAGNOSIS — I129 Hypertensive chronic kidney disease with stage 1 through stage 4 chronic kidney disease, or unspecified chronic kidney disease: Secondary | ICD-10-CM | POA: Diagnosis not present

## 2023-08-13 DIAGNOSIS — F319 Bipolar disorder, unspecified: Secondary | ICD-10-CM | POA: Diagnosis not present

## 2023-08-24 DIAGNOSIS — N184 Chronic kidney disease, stage 4 (severe): Secondary | ICD-10-CM | POA: Diagnosis not present

## 2023-08-24 DIAGNOSIS — E1122 Type 2 diabetes mellitus with diabetic chronic kidney disease: Secondary | ICD-10-CM | POA: Diagnosis not present

## 2023-08-24 DIAGNOSIS — I129 Hypertensive chronic kidney disease with stage 1 through stage 4 chronic kidney disease, or unspecified chronic kidney disease: Secondary | ICD-10-CM | POA: Diagnosis not present

## 2023-08-24 DIAGNOSIS — F0284 Dementia in other diseases classified elsewhere, unspecified severity, with anxiety: Secondary | ICD-10-CM | POA: Diagnosis not present

## 2023-08-24 DIAGNOSIS — F0283 Dementia in other diseases classified elsewhere, unspecified severity, with mood disturbance: Secondary | ICD-10-CM | POA: Diagnosis not present

## 2023-08-24 DIAGNOSIS — G20A1 Parkinson's disease without dyskinesia, without mention of fluctuations: Secondary | ICD-10-CM | POA: Diagnosis not present

## 2023-08-24 DIAGNOSIS — F319 Bipolar disorder, unspecified: Secondary | ICD-10-CM | POA: Diagnosis not present

## 2023-08-24 DIAGNOSIS — J449 Chronic obstructive pulmonary disease, unspecified: Secondary | ICD-10-CM | POA: Diagnosis not present

## 2023-08-24 DIAGNOSIS — Z51A Encounter for sepsis aftercare: Secondary | ICD-10-CM | POA: Diagnosis not present

## 2023-08-28 DIAGNOSIS — I129 Hypertensive chronic kidney disease with stage 1 through stage 4 chronic kidney disease, or unspecified chronic kidney disease: Secondary | ICD-10-CM | POA: Diagnosis not present

## 2023-08-28 DIAGNOSIS — G20A1 Parkinson's disease without dyskinesia, without mention of fluctuations: Secondary | ICD-10-CM | POA: Diagnosis not present

## 2023-08-28 DIAGNOSIS — Z51A Encounter for sepsis aftercare: Secondary | ICD-10-CM | POA: Diagnosis not present

## 2023-08-28 DIAGNOSIS — J449 Chronic obstructive pulmonary disease, unspecified: Secondary | ICD-10-CM | POA: Diagnosis not present

## 2023-08-28 DIAGNOSIS — F0283 Dementia in other diseases classified elsewhere, unspecified severity, with mood disturbance: Secondary | ICD-10-CM | POA: Diagnosis not present

## 2023-08-28 DIAGNOSIS — F319 Bipolar disorder, unspecified: Secondary | ICD-10-CM | POA: Diagnosis not present

## 2023-08-28 DIAGNOSIS — F0284 Dementia in other diseases classified elsewhere, unspecified severity, with anxiety: Secondary | ICD-10-CM | POA: Diagnosis not present

## 2023-08-28 DIAGNOSIS — E1122 Type 2 diabetes mellitus with diabetic chronic kidney disease: Secondary | ICD-10-CM | POA: Diagnosis not present

## 2023-08-28 DIAGNOSIS — N184 Chronic kidney disease, stage 4 (severe): Secondary | ICD-10-CM | POA: Diagnosis not present

## 2023-08-29 DIAGNOSIS — J449 Chronic obstructive pulmonary disease, unspecified: Secondary | ICD-10-CM | POA: Diagnosis not present

## 2023-08-29 DIAGNOSIS — F319 Bipolar disorder, unspecified: Secondary | ICD-10-CM | POA: Diagnosis not present

## 2023-08-29 DIAGNOSIS — N184 Chronic kidney disease, stage 4 (severe): Secondary | ICD-10-CM | POA: Diagnosis not present

## 2023-08-29 DIAGNOSIS — F0284 Dementia in other diseases classified elsewhere, unspecified severity, with anxiety: Secondary | ICD-10-CM | POA: Diagnosis not present

## 2023-08-29 DIAGNOSIS — F0283 Dementia in other diseases classified elsewhere, unspecified severity, with mood disturbance: Secondary | ICD-10-CM | POA: Diagnosis not present

## 2023-08-29 DIAGNOSIS — I129 Hypertensive chronic kidney disease with stage 1 through stage 4 chronic kidney disease, or unspecified chronic kidney disease: Secondary | ICD-10-CM | POA: Diagnosis not present

## 2023-08-29 DIAGNOSIS — G20A1 Parkinson's disease without dyskinesia, without mention of fluctuations: Secondary | ICD-10-CM | POA: Diagnosis not present

## 2023-08-29 DIAGNOSIS — Z51A Encounter for sepsis aftercare: Secondary | ICD-10-CM | POA: Diagnosis not present

## 2023-08-29 DIAGNOSIS — E1122 Type 2 diabetes mellitus with diabetic chronic kidney disease: Secondary | ICD-10-CM | POA: Diagnosis not present

## 2023-08-30 DIAGNOSIS — F0284 Dementia in other diseases classified elsewhere, unspecified severity, with anxiety: Secondary | ICD-10-CM | POA: Diagnosis not present

## 2023-08-30 DIAGNOSIS — F319 Bipolar disorder, unspecified: Secondary | ICD-10-CM | POA: Diagnosis not present

## 2023-08-30 DIAGNOSIS — I129 Hypertensive chronic kidney disease with stage 1 through stage 4 chronic kidney disease, or unspecified chronic kidney disease: Secondary | ICD-10-CM | POA: Diagnosis not present

## 2023-08-30 DIAGNOSIS — F0283 Dementia in other diseases classified elsewhere, unspecified severity, with mood disturbance: Secondary | ICD-10-CM | POA: Diagnosis not present

## 2023-08-30 DIAGNOSIS — E1122 Type 2 diabetes mellitus with diabetic chronic kidney disease: Secondary | ICD-10-CM | POA: Diagnosis not present

## 2023-08-30 DIAGNOSIS — Z51A Encounter for sepsis aftercare: Secondary | ICD-10-CM | POA: Diagnosis not present

## 2023-08-30 DIAGNOSIS — G20A1 Parkinson's disease without dyskinesia, without mention of fluctuations: Secondary | ICD-10-CM | POA: Diagnosis not present

## 2023-08-30 DIAGNOSIS — J449 Chronic obstructive pulmonary disease, unspecified: Secondary | ICD-10-CM | POA: Diagnosis not present

## 2023-08-30 DIAGNOSIS — N184 Chronic kidney disease, stage 4 (severe): Secondary | ICD-10-CM | POA: Diagnosis not present

## 2023-09-03 DIAGNOSIS — Z51A Encounter for sepsis aftercare: Secondary | ICD-10-CM | POA: Diagnosis not present

## 2023-09-03 DIAGNOSIS — E1122 Type 2 diabetes mellitus with diabetic chronic kidney disease: Secondary | ICD-10-CM | POA: Diagnosis not present

## 2023-09-03 DIAGNOSIS — I129 Hypertensive chronic kidney disease with stage 1 through stage 4 chronic kidney disease, or unspecified chronic kidney disease: Secondary | ICD-10-CM | POA: Diagnosis not present

## 2023-09-03 DIAGNOSIS — N184 Chronic kidney disease, stage 4 (severe): Secondary | ICD-10-CM | POA: Diagnosis not present

## 2023-09-03 DIAGNOSIS — G20A1 Parkinson's disease without dyskinesia, without mention of fluctuations: Secondary | ICD-10-CM | POA: Diagnosis not present

## 2023-09-03 DIAGNOSIS — F319 Bipolar disorder, unspecified: Secondary | ICD-10-CM | POA: Diagnosis not present

## 2023-09-03 DIAGNOSIS — F0284 Dementia in other diseases classified elsewhere, unspecified severity, with anxiety: Secondary | ICD-10-CM | POA: Diagnosis not present

## 2023-09-03 DIAGNOSIS — J449 Chronic obstructive pulmonary disease, unspecified: Secondary | ICD-10-CM | POA: Diagnosis not present

## 2023-09-03 DIAGNOSIS — F0283 Dementia in other diseases classified elsewhere, unspecified severity, with mood disturbance: Secondary | ICD-10-CM | POA: Diagnosis not present

## 2023-09-05 ENCOUNTER — Telehealth: Payer: Self-pay | Admitting: Cardiology

## 2023-09-05 DIAGNOSIS — I129 Hypertensive chronic kidney disease with stage 1 through stage 4 chronic kidney disease, or unspecified chronic kidney disease: Secondary | ICD-10-CM | POA: Diagnosis not present

## 2023-09-05 DIAGNOSIS — F0283 Dementia in other diseases classified elsewhere, unspecified severity, with mood disturbance: Secondary | ICD-10-CM | POA: Diagnosis not present

## 2023-09-05 DIAGNOSIS — N184 Chronic kidney disease, stage 4 (severe): Secondary | ICD-10-CM | POA: Diagnosis not present

## 2023-09-05 DIAGNOSIS — F319 Bipolar disorder, unspecified: Secondary | ICD-10-CM | POA: Diagnosis not present

## 2023-09-05 DIAGNOSIS — E1122 Type 2 diabetes mellitus with diabetic chronic kidney disease: Secondary | ICD-10-CM | POA: Diagnosis not present

## 2023-09-05 DIAGNOSIS — F0284 Dementia in other diseases classified elsewhere, unspecified severity, with anxiety: Secondary | ICD-10-CM | POA: Diagnosis not present

## 2023-09-05 DIAGNOSIS — G20A1 Parkinson's disease without dyskinesia, without mention of fluctuations: Secondary | ICD-10-CM | POA: Diagnosis not present

## 2023-09-05 DIAGNOSIS — J449 Chronic obstructive pulmonary disease, unspecified: Secondary | ICD-10-CM | POA: Diagnosis not present

## 2023-09-05 DIAGNOSIS — Z51A Encounter for sepsis aftercare: Secondary | ICD-10-CM | POA: Diagnosis not present

## 2023-09-05 NOTE — Telephone Encounter (Signed)
Pt c/o medication issue:  1. Name of Medication:   apixaban (ELIQUIS) 5 MG TABS tablet    2. How are you currently taking this medication (dosage and times per day)? As written   3. Are you having a reaction (difficulty breathing--STAT)? No   4. What is your medication issue? Pt spouse called in stating this medication is too expensive. They would like to discuss other options and samples.

## 2023-09-05 NOTE — Telephone Encounter (Signed)
Spoke with granddaughter regarding pt assistance for Eliquis. She asked if we could print application for her to pick up.

## 2023-09-06 DIAGNOSIS — G20A1 Parkinson's disease without dyskinesia, without mention of fluctuations: Secondary | ICD-10-CM | POA: Diagnosis not present

## 2023-09-06 DIAGNOSIS — F319 Bipolar disorder, unspecified: Secondary | ICD-10-CM | POA: Diagnosis not present

## 2023-09-06 DIAGNOSIS — N184 Chronic kidney disease, stage 4 (severe): Secondary | ICD-10-CM | POA: Diagnosis not present

## 2023-09-06 DIAGNOSIS — F0284 Dementia in other diseases classified elsewhere, unspecified severity, with anxiety: Secondary | ICD-10-CM | POA: Diagnosis not present

## 2023-09-06 DIAGNOSIS — F0283 Dementia in other diseases classified elsewhere, unspecified severity, with mood disturbance: Secondary | ICD-10-CM | POA: Diagnosis not present

## 2023-09-06 DIAGNOSIS — Z51A Encounter for sepsis aftercare: Secondary | ICD-10-CM | POA: Diagnosis not present

## 2023-09-06 DIAGNOSIS — E1122 Type 2 diabetes mellitus with diabetic chronic kidney disease: Secondary | ICD-10-CM | POA: Diagnosis not present

## 2023-09-06 DIAGNOSIS — I129 Hypertensive chronic kidney disease with stage 1 through stage 4 chronic kidney disease, or unspecified chronic kidney disease: Secondary | ICD-10-CM | POA: Diagnosis not present

## 2023-09-06 DIAGNOSIS — J449 Chronic obstructive pulmonary disease, unspecified: Secondary | ICD-10-CM | POA: Diagnosis not present

## 2023-09-10 DIAGNOSIS — G20A1 Parkinson's disease without dyskinesia, without mention of fluctuations: Secondary | ICD-10-CM | POA: Diagnosis not present

## 2023-09-10 DIAGNOSIS — J449 Chronic obstructive pulmonary disease, unspecified: Secondary | ICD-10-CM | POA: Diagnosis not present

## 2023-09-10 DIAGNOSIS — F0283 Dementia in other diseases classified elsewhere, unspecified severity, with mood disturbance: Secondary | ICD-10-CM | POA: Diagnosis not present

## 2023-09-10 DIAGNOSIS — I129 Hypertensive chronic kidney disease with stage 1 through stage 4 chronic kidney disease, or unspecified chronic kidney disease: Secondary | ICD-10-CM | POA: Diagnosis not present

## 2023-09-10 DIAGNOSIS — N184 Chronic kidney disease, stage 4 (severe): Secondary | ICD-10-CM | POA: Diagnosis not present

## 2023-09-10 DIAGNOSIS — F0284 Dementia in other diseases classified elsewhere, unspecified severity, with anxiety: Secondary | ICD-10-CM | POA: Diagnosis not present

## 2023-09-10 DIAGNOSIS — E1122 Type 2 diabetes mellitus with diabetic chronic kidney disease: Secondary | ICD-10-CM | POA: Diagnosis not present

## 2023-09-10 DIAGNOSIS — F319 Bipolar disorder, unspecified: Secondary | ICD-10-CM | POA: Diagnosis not present

## 2023-09-10 DIAGNOSIS — Z51A Encounter for sepsis aftercare: Secondary | ICD-10-CM | POA: Diagnosis not present

## 2023-09-11 DIAGNOSIS — F0283 Dementia in other diseases classified elsewhere, unspecified severity, with mood disturbance: Secondary | ICD-10-CM | POA: Diagnosis not present

## 2023-09-11 DIAGNOSIS — F0284 Dementia in other diseases classified elsewhere, unspecified severity, with anxiety: Secondary | ICD-10-CM | POA: Diagnosis not present

## 2023-09-11 DIAGNOSIS — Z51A Encounter for sepsis aftercare: Secondary | ICD-10-CM | POA: Diagnosis not present

## 2023-09-11 DIAGNOSIS — J449 Chronic obstructive pulmonary disease, unspecified: Secondary | ICD-10-CM | POA: Diagnosis not present

## 2023-09-11 DIAGNOSIS — I129 Hypertensive chronic kidney disease with stage 1 through stage 4 chronic kidney disease, or unspecified chronic kidney disease: Secondary | ICD-10-CM | POA: Diagnosis not present

## 2023-09-11 DIAGNOSIS — E1122 Type 2 diabetes mellitus with diabetic chronic kidney disease: Secondary | ICD-10-CM | POA: Diagnosis not present

## 2023-09-11 DIAGNOSIS — G20A1 Parkinson's disease without dyskinesia, without mention of fluctuations: Secondary | ICD-10-CM | POA: Diagnosis not present

## 2023-09-11 DIAGNOSIS — F319 Bipolar disorder, unspecified: Secondary | ICD-10-CM | POA: Diagnosis not present

## 2023-09-11 DIAGNOSIS — N184 Chronic kidney disease, stage 4 (severe): Secondary | ICD-10-CM | POA: Diagnosis not present

## 2023-09-13 DIAGNOSIS — J441 Chronic obstructive pulmonary disease with (acute) exacerbation: Secondary | ICD-10-CM | POA: Diagnosis not present

## 2023-09-13 DIAGNOSIS — J9601 Acute respiratory failure with hypoxia: Secondary | ICD-10-CM | POA: Diagnosis not present

## 2023-09-13 DIAGNOSIS — I129 Hypertensive chronic kidney disease with stage 1 through stage 4 chronic kidney disease, or unspecified chronic kidney disease: Secondary | ICD-10-CM | POA: Diagnosis not present

## 2023-09-13 DIAGNOSIS — J440 Chronic obstructive pulmonary disease with acute lower respiratory infection: Secondary | ICD-10-CM | POA: Diagnosis not present

## 2023-09-13 DIAGNOSIS — J189 Pneumonia, unspecified organism: Secondary | ICD-10-CM | POA: Diagnosis not present

## 2023-09-13 DIAGNOSIS — E1122 Type 2 diabetes mellitus with diabetic chronic kidney disease: Secondary | ICD-10-CM | POA: Diagnosis not present

## 2023-09-13 DIAGNOSIS — A419 Sepsis, unspecified organism: Secondary | ICD-10-CM | POA: Diagnosis not present

## 2023-09-13 DIAGNOSIS — J1000 Influenza due to other identified influenza virus with unspecified type of pneumonia: Secondary | ICD-10-CM | POA: Diagnosis not present

## 2023-09-13 DIAGNOSIS — N184 Chronic kidney disease, stage 4 (severe): Secondary | ICD-10-CM | POA: Diagnosis not present

## 2023-09-18 DIAGNOSIS — A419 Sepsis, unspecified organism: Secondary | ICD-10-CM | POA: Diagnosis not present

## 2023-09-18 DIAGNOSIS — J189 Pneumonia, unspecified organism: Secondary | ICD-10-CM | POA: Diagnosis not present

## 2023-09-18 DIAGNOSIS — J441 Chronic obstructive pulmonary disease with (acute) exacerbation: Secondary | ICD-10-CM | POA: Diagnosis not present

## 2023-09-18 DIAGNOSIS — I129 Hypertensive chronic kidney disease with stage 1 through stage 4 chronic kidney disease, or unspecified chronic kidney disease: Secondary | ICD-10-CM | POA: Diagnosis not present

## 2023-09-18 DIAGNOSIS — N184 Chronic kidney disease, stage 4 (severe): Secondary | ICD-10-CM | POA: Diagnosis not present

## 2023-09-18 DIAGNOSIS — J440 Chronic obstructive pulmonary disease with acute lower respiratory infection: Secondary | ICD-10-CM | POA: Diagnosis not present

## 2023-09-18 DIAGNOSIS — J1000 Influenza due to other identified influenza virus with unspecified type of pneumonia: Secondary | ICD-10-CM | POA: Diagnosis not present

## 2023-09-18 DIAGNOSIS — J9601 Acute respiratory failure with hypoxia: Secondary | ICD-10-CM | POA: Diagnosis not present

## 2023-09-18 DIAGNOSIS — E1122 Type 2 diabetes mellitus with diabetic chronic kidney disease: Secondary | ICD-10-CM | POA: Diagnosis not present

## 2023-09-20 DIAGNOSIS — E1122 Type 2 diabetes mellitus with diabetic chronic kidney disease: Secondary | ICD-10-CM | POA: Diagnosis not present

## 2023-09-20 DIAGNOSIS — J9601 Acute respiratory failure with hypoxia: Secondary | ICD-10-CM | POA: Diagnosis not present

## 2023-09-20 DIAGNOSIS — J189 Pneumonia, unspecified organism: Secondary | ICD-10-CM | POA: Diagnosis not present

## 2023-09-20 DIAGNOSIS — J1000 Influenza due to other identified influenza virus with unspecified type of pneumonia: Secondary | ICD-10-CM | POA: Diagnosis not present

## 2023-09-20 DIAGNOSIS — I129 Hypertensive chronic kidney disease with stage 1 through stage 4 chronic kidney disease, or unspecified chronic kidney disease: Secondary | ICD-10-CM | POA: Diagnosis not present

## 2023-09-20 DIAGNOSIS — J440 Chronic obstructive pulmonary disease with acute lower respiratory infection: Secondary | ICD-10-CM | POA: Diagnosis not present

## 2023-09-20 DIAGNOSIS — N184 Chronic kidney disease, stage 4 (severe): Secondary | ICD-10-CM | POA: Diagnosis not present

## 2023-09-20 DIAGNOSIS — A419 Sepsis, unspecified organism: Secondary | ICD-10-CM | POA: Diagnosis not present

## 2023-09-20 DIAGNOSIS — J441 Chronic obstructive pulmonary disease with (acute) exacerbation: Secondary | ICD-10-CM | POA: Diagnosis not present

## 2023-09-24 DIAGNOSIS — N184 Chronic kidney disease, stage 4 (severe): Secondary | ICD-10-CM | POA: Diagnosis not present

## 2023-09-24 DIAGNOSIS — A419 Sepsis, unspecified organism: Secondary | ICD-10-CM | POA: Diagnosis not present

## 2023-09-24 DIAGNOSIS — E1122 Type 2 diabetes mellitus with diabetic chronic kidney disease: Secondary | ICD-10-CM | POA: Diagnosis not present

## 2023-09-24 DIAGNOSIS — I129 Hypertensive chronic kidney disease with stage 1 through stage 4 chronic kidney disease, or unspecified chronic kidney disease: Secondary | ICD-10-CM | POA: Diagnosis not present

## 2023-09-24 DIAGNOSIS — J441 Chronic obstructive pulmonary disease with (acute) exacerbation: Secondary | ICD-10-CM | POA: Diagnosis not present

## 2023-09-24 DIAGNOSIS — J189 Pneumonia, unspecified organism: Secondary | ICD-10-CM | POA: Diagnosis not present

## 2023-09-24 DIAGNOSIS — J9601 Acute respiratory failure with hypoxia: Secondary | ICD-10-CM | POA: Diagnosis not present

## 2023-09-24 DIAGNOSIS — J440 Chronic obstructive pulmonary disease with acute lower respiratory infection: Secondary | ICD-10-CM | POA: Diagnosis not present

## 2023-09-24 DIAGNOSIS — J1000 Influenza due to other identified influenza virus with unspecified type of pneumonia: Secondary | ICD-10-CM | POA: Diagnosis not present

## 2023-09-26 DIAGNOSIS — J189 Pneumonia, unspecified organism: Secondary | ICD-10-CM | POA: Diagnosis not present

## 2023-09-26 DIAGNOSIS — A419 Sepsis, unspecified organism: Secondary | ICD-10-CM | POA: Diagnosis not present

## 2023-09-26 DIAGNOSIS — J9601 Acute respiratory failure with hypoxia: Secondary | ICD-10-CM | POA: Diagnosis not present

## 2023-09-26 DIAGNOSIS — N184 Chronic kidney disease, stage 4 (severe): Secondary | ICD-10-CM | POA: Diagnosis not present

## 2023-09-26 DIAGNOSIS — J441 Chronic obstructive pulmonary disease with (acute) exacerbation: Secondary | ICD-10-CM | POA: Diagnosis not present

## 2023-09-26 DIAGNOSIS — J440 Chronic obstructive pulmonary disease with acute lower respiratory infection: Secondary | ICD-10-CM | POA: Diagnosis not present

## 2023-09-26 DIAGNOSIS — I129 Hypertensive chronic kidney disease with stage 1 through stage 4 chronic kidney disease, or unspecified chronic kidney disease: Secondary | ICD-10-CM | POA: Diagnosis not present

## 2023-09-26 DIAGNOSIS — E1122 Type 2 diabetes mellitus with diabetic chronic kidney disease: Secondary | ICD-10-CM | POA: Diagnosis not present

## 2023-09-26 DIAGNOSIS — J1000 Influenza due to other identified influenza virus with unspecified type of pneumonia: Secondary | ICD-10-CM | POA: Diagnosis not present

## 2023-10-01 DIAGNOSIS — J440 Chronic obstructive pulmonary disease with acute lower respiratory infection: Secondary | ICD-10-CM | POA: Diagnosis not present

## 2023-10-01 DIAGNOSIS — A419 Sepsis, unspecified organism: Secondary | ICD-10-CM | POA: Diagnosis not present

## 2023-10-01 DIAGNOSIS — N184 Chronic kidney disease, stage 4 (severe): Secondary | ICD-10-CM | POA: Diagnosis not present

## 2023-10-01 DIAGNOSIS — J1000 Influenza due to other identified influenza virus with unspecified type of pneumonia: Secondary | ICD-10-CM | POA: Diagnosis not present

## 2023-10-01 DIAGNOSIS — E1122 Type 2 diabetes mellitus with diabetic chronic kidney disease: Secondary | ICD-10-CM | POA: Diagnosis not present

## 2023-10-01 DIAGNOSIS — I129 Hypertensive chronic kidney disease with stage 1 through stage 4 chronic kidney disease, or unspecified chronic kidney disease: Secondary | ICD-10-CM | POA: Diagnosis not present

## 2023-10-01 DIAGNOSIS — J441 Chronic obstructive pulmonary disease with (acute) exacerbation: Secondary | ICD-10-CM | POA: Diagnosis not present

## 2023-10-01 DIAGNOSIS — J9601 Acute respiratory failure with hypoxia: Secondary | ICD-10-CM | POA: Diagnosis not present

## 2023-10-01 DIAGNOSIS — J189 Pneumonia, unspecified organism: Secondary | ICD-10-CM | POA: Diagnosis not present

## 2023-10-03 DIAGNOSIS — J9601 Acute respiratory failure with hypoxia: Secondary | ICD-10-CM | POA: Diagnosis not present

## 2023-10-03 DIAGNOSIS — A419 Sepsis, unspecified organism: Secondary | ICD-10-CM | POA: Diagnosis not present

## 2023-10-03 DIAGNOSIS — J1000 Influenza due to other identified influenza virus with unspecified type of pneumonia: Secondary | ICD-10-CM | POA: Diagnosis not present

## 2023-10-03 DIAGNOSIS — I129 Hypertensive chronic kidney disease with stage 1 through stage 4 chronic kidney disease, or unspecified chronic kidney disease: Secondary | ICD-10-CM | POA: Diagnosis not present

## 2023-10-03 DIAGNOSIS — N184 Chronic kidney disease, stage 4 (severe): Secondary | ICD-10-CM | POA: Diagnosis not present

## 2023-10-03 DIAGNOSIS — J440 Chronic obstructive pulmonary disease with acute lower respiratory infection: Secondary | ICD-10-CM | POA: Diagnosis not present

## 2023-10-03 DIAGNOSIS — J441 Chronic obstructive pulmonary disease with (acute) exacerbation: Secondary | ICD-10-CM | POA: Diagnosis not present

## 2023-10-03 DIAGNOSIS — J189 Pneumonia, unspecified organism: Secondary | ICD-10-CM | POA: Diagnosis not present

## 2023-10-03 DIAGNOSIS — E1122 Type 2 diabetes mellitus with diabetic chronic kidney disease: Secondary | ICD-10-CM | POA: Diagnosis not present

## 2023-10-08 DIAGNOSIS — J189 Pneumonia, unspecified organism: Secondary | ICD-10-CM | POA: Diagnosis not present

## 2023-10-08 DIAGNOSIS — I129 Hypertensive chronic kidney disease with stage 1 through stage 4 chronic kidney disease, or unspecified chronic kidney disease: Secondary | ICD-10-CM | POA: Diagnosis not present

## 2023-10-08 DIAGNOSIS — J440 Chronic obstructive pulmonary disease with acute lower respiratory infection: Secondary | ICD-10-CM | POA: Diagnosis not present

## 2023-10-08 DIAGNOSIS — E1122 Type 2 diabetes mellitus with diabetic chronic kidney disease: Secondary | ICD-10-CM | POA: Diagnosis not present

## 2023-10-08 DIAGNOSIS — N184 Chronic kidney disease, stage 4 (severe): Secondary | ICD-10-CM | POA: Diagnosis not present

## 2023-10-08 DIAGNOSIS — J441 Chronic obstructive pulmonary disease with (acute) exacerbation: Secondary | ICD-10-CM | POA: Diagnosis not present

## 2023-10-08 DIAGNOSIS — A419 Sepsis, unspecified organism: Secondary | ICD-10-CM | POA: Diagnosis not present

## 2023-10-08 DIAGNOSIS — J1000 Influenza due to other identified influenza virus with unspecified type of pneumonia: Secondary | ICD-10-CM | POA: Diagnosis not present

## 2023-10-08 DIAGNOSIS — J9601 Acute respiratory failure with hypoxia: Secondary | ICD-10-CM | POA: Diagnosis not present

## 2023-10-09 DIAGNOSIS — J441 Chronic obstructive pulmonary disease with (acute) exacerbation: Secondary | ICD-10-CM | POA: Diagnosis not present

## 2023-10-09 DIAGNOSIS — J1000 Influenza due to other identified influenza virus with unspecified type of pneumonia: Secondary | ICD-10-CM | POA: Diagnosis not present

## 2023-10-09 DIAGNOSIS — J189 Pneumonia, unspecified organism: Secondary | ICD-10-CM | POA: Diagnosis not present

## 2023-10-09 DIAGNOSIS — J9601 Acute respiratory failure with hypoxia: Secondary | ICD-10-CM | POA: Diagnosis not present

## 2023-10-09 DIAGNOSIS — I129 Hypertensive chronic kidney disease with stage 1 through stage 4 chronic kidney disease, or unspecified chronic kidney disease: Secondary | ICD-10-CM | POA: Diagnosis not present

## 2023-10-09 DIAGNOSIS — J440 Chronic obstructive pulmonary disease with acute lower respiratory infection: Secondary | ICD-10-CM | POA: Diagnosis not present

## 2023-10-09 DIAGNOSIS — E1122 Type 2 diabetes mellitus with diabetic chronic kidney disease: Secondary | ICD-10-CM | POA: Diagnosis not present

## 2023-10-09 DIAGNOSIS — A419 Sepsis, unspecified organism: Secondary | ICD-10-CM | POA: Diagnosis not present

## 2023-10-09 DIAGNOSIS — N184 Chronic kidney disease, stage 4 (severe): Secondary | ICD-10-CM | POA: Diagnosis not present

## 2023-10-16 DIAGNOSIS — A419 Sepsis, unspecified organism: Secondary | ICD-10-CM | POA: Diagnosis not present

## 2023-10-16 DIAGNOSIS — E1122 Type 2 diabetes mellitus with diabetic chronic kidney disease: Secondary | ICD-10-CM | POA: Diagnosis not present

## 2023-10-16 DIAGNOSIS — I129 Hypertensive chronic kidney disease with stage 1 through stage 4 chronic kidney disease, or unspecified chronic kidney disease: Secondary | ICD-10-CM | POA: Diagnosis not present

## 2023-10-16 DIAGNOSIS — J440 Chronic obstructive pulmonary disease with acute lower respiratory infection: Secondary | ICD-10-CM | POA: Diagnosis not present

## 2023-10-16 DIAGNOSIS — N184 Chronic kidney disease, stage 4 (severe): Secondary | ICD-10-CM | POA: Diagnosis not present

## 2023-10-16 DIAGNOSIS — J1000 Influenza due to other identified influenza virus with unspecified type of pneumonia: Secondary | ICD-10-CM | POA: Diagnosis not present

## 2023-10-16 DIAGNOSIS — J189 Pneumonia, unspecified organism: Secondary | ICD-10-CM | POA: Diagnosis not present

## 2023-10-16 DIAGNOSIS — J9601 Acute respiratory failure with hypoxia: Secondary | ICD-10-CM | POA: Diagnosis not present

## 2023-10-16 DIAGNOSIS — J441 Chronic obstructive pulmonary disease with (acute) exacerbation: Secondary | ICD-10-CM | POA: Diagnosis not present

## 2023-10-18 DIAGNOSIS — J1000 Influenza due to other identified influenza virus with unspecified type of pneumonia: Secondary | ICD-10-CM | POA: Diagnosis not present

## 2023-10-18 DIAGNOSIS — J441 Chronic obstructive pulmonary disease with (acute) exacerbation: Secondary | ICD-10-CM | POA: Diagnosis not present

## 2023-10-18 DIAGNOSIS — N184 Chronic kidney disease, stage 4 (severe): Secondary | ICD-10-CM | POA: Diagnosis not present

## 2023-10-18 DIAGNOSIS — J9601 Acute respiratory failure with hypoxia: Secondary | ICD-10-CM | POA: Diagnosis not present

## 2023-10-18 DIAGNOSIS — I129 Hypertensive chronic kidney disease with stage 1 through stage 4 chronic kidney disease, or unspecified chronic kidney disease: Secondary | ICD-10-CM | POA: Diagnosis not present

## 2023-10-18 DIAGNOSIS — A419 Sepsis, unspecified organism: Secondary | ICD-10-CM | POA: Diagnosis not present

## 2023-10-18 DIAGNOSIS — J440 Chronic obstructive pulmonary disease with acute lower respiratory infection: Secondary | ICD-10-CM | POA: Diagnosis not present

## 2023-10-18 DIAGNOSIS — E1122 Type 2 diabetes mellitus with diabetic chronic kidney disease: Secondary | ICD-10-CM | POA: Diagnosis not present

## 2023-10-18 DIAGNOSIS — J189 Pneumonia, unspecified organism: Secondary | ICD-10-CM | POA: Diagnosis not present

## 2023-10-22 DIAGNOSIS — J440 Chronic obstructive pulmonary disease with acute lower respiratory infection: Secondary | ICD-10-CM | POA: Diagnosis not present

## 2023-10-22 DIAGNOSIS — A419 Sepsis, unspecified organism: Secondary | ICD-10-CM | POA: Diagnosis not present

## 2023-10-22 DIAGNOSIS — J189 Pneumonia, unspecified organism: Secondary | ICD-10-CM | POA: Diagnosis not present

## 2023-10-22 DIAGNOSIS — E1122 Type 2 diabetes mellitus with diabetic chronic kidney disease: Secondary | ICD-10-CM | POA: Diagnosis not present

## 2023-10-22 DIAGNOSIS — N184 Chronic kidney disease, stage 4 (severe): Secondary | ICD-10-CM | POA: Diagnosis not present

## 2023-10-22 DIAGNOSIS — I129 Hypertensive chronic kidney disease with stage 1 through stage 4 chronic kidney disease, or unspecified chronic kidney disease: Secondary | ICD-10-CM | POA: Diagnosis not present

## 2023-10-22 DIAGNOSIS — J9601 Acute respiratory failure with hypoxia: Secondary | ICD-10-CM | POA: Diagnosis not present

## 2023-10-22 DIAGNOSIS — J441 Chronic obstructive pulmonary disease with (acute) exacerbation: Secondary | ICD-10-CM | POA: Diagnosis not present

## 2023-10-22 DIAGNOSIS — J1000 Influenza due to other identified influenza virus with unspecified type of pneumonia: Secondary | ICD-10-CM | POA: Diagnosis not present

## 2023-10-25 DIAGNOSIS — J441 Chronic obstructive pulmonary disease with (acute) exacerbation: Secondary | ICD-10-CM | POA: Diagnosis not present

## 2023-10-25 DIAGNOSIS — J440 Chronic obstructive pulmonary disease with acute lower respiratory infection: Secondary | ICD-10-CM | POA: Diagnosis not present

## 2023-10-25 DIAGNOSIS — J1000 Influenza due to other identified influenza virus with unspecified type of pneumonia: Secondary | ICD-10-CM | POA: Diagnosis not present

## 2023-10-25 DIAGNOSIS — J189 Pneumonia, unspecified organism: Secondary | ICD-10-CM | POA: Diagnosis not present

## 2023-10-25 DIAGNOSIS — I129 Hypertensive chronic kidney disease with stage 1 through stage 4 chronic kidney disease, or unspecified chronic kidney disease: Secondary | ICD-10-CM | POA: Diagnosis not present

## 2023-10-25 DIAGNOSIS — E1122 Type 2 diabetes mellitus with diabetic chronic kidney disease: Secondary | ICD-10-CM | POA: Diagnosis not present

## 2023-10-25 DIAGNOSIS — J9601 Acute respiratory failure with hypoxia: Secondary | ICD-10-CM | POA: Diagnosis not present

## 2023-10-25 DIAGNOSIS — A419 Sepsis, unspecified organism: Secondary | ICD-10-CM | POA: Diagnosis not present

## 2023-10-25 DIAGNOSIS — N184 Chronic kidney disease, stage 4 (severe): Secondary | ICD-10-CM | POA: Diagnosis not present

## 2023-10-29 DIAGNOSIS — J441 Chronic obstructive pulmonary disease with (acute) exacerbation: Secondary | ICD-10-CM | POA: Diagnosis not present

## 2023-10-29 DIAGNOSIS — J440 Chronic obstructive pulmonary disease with acute lower respiratory infection: Secondary | ICD-10-CM | POA: Diagnosis not present

## 2023-10-29 DIAGNOSIS — J9601 Acute respiratory failure with hypoxia: Secondary | ICD-10-CM | POA: Diagnosis not present

## 2023-10-29 DIAGNOSIS — I129 Hypertensive chronic kidney disease with stage 1 through stage 4 chronic kidney disease, or unspecified chronic kidney disease: Secondary | ICD-10-CM | POA: Diagnosis not present

## 2023-10-29 DIAGNOSIS — E1122 Type 2 diabetes mellitus with diabetic chronic kidney disease: Secondary | ICD-10-CM | POA: Diagnosis not present

## 2023-10-29 DIAGNOSIS — J189 Pneumonia, unspecified organism: Secondary | ICD-10-CM | POA: Diagnosis not present

## 2023-10-29 DIAGNOSIS — A419 Sepsis, unspecified organism: Secondary | ICD-10-CM | POA: Diagnosis not present

## 2023-10-29 DIAGNOSIS — N184 Chronic kidney disease, stage 4 (severe): Secondary | ICD-10-CM | POA: Diagnosis not present

## 2023-10-29 DIAGNOSIS — J1000 Influenza due to other identified influenza virus with unspecified type of pneumonia: Secondary | ICD-10-CM | POA: Diagnosis not present

## 2023-11-05 ENCOUNTER — Encounter: Payer: Self-pay | Admitting: Gastroenterology

## 2023-11-08 ENCOUNTER — Encounter: Payer: Self-pay | Admitting: Gastroenterology

## 2023-12-04 ENCOUNTER — Telehealth: Payer: Self-pay | Admitting: Cardiology

## 2023-12-04 DIAGNOSIS — I480 Paroxysmal atrial fibrillation: Secondary | ICD-10-CM

## 2023-12-04 MED ORDER — APIXABAN 5 MG PO TABS
5.0000 mg | ORAL_TABLET | Freq: Two times a day (BID) | ORAL | 0 refills | Status: DC
Start: 2023-12-04 — End: 2024-01-08

## 2023-12-04 NOTE — Telephone Encounter (Signed)
 Patient is needing a refill on his Eliquis . CB # [phone] He's currently out

## 2023-12-04 NOTE — Telephone Encounter (Signed)
 Office visit scheduled for 12/17/23. Note placed on appt for labs to be drawn. Refill sent to prevent any missed doses.

## 2023-12-04 NOTE — Telephone Encounter (Signed)
 Prescription refill request for Eliquis  received. Indication: Afib  Last office visit: 04/07/22 Gordan Latina)  Scr: 1.43 (08/17/22)  Age: 83 Weight: 85.5kg   Labs and office visit overdue. Called and spoke with pt's significant other, Altamease Asters. Made her aware of overdue office visit and labs. Transferred call to scheduling. Will send 30 day supply to pharmacy to prevent pt from any missed doses.    \

## 2023-12-07 ENCOUNTER — Ambulatory Visit: Admitting: Gastroenterology

## 2023-12-17 ENCOUNTER — Ambulatory Visit: Attending: Cardiology | Admitting: Cardiology

## 2023-12-17 ENCOUNTER — Encounter: Payer: Self-pay | Admitting: Cardiology

## 2023-12-17 VITALS — BP 92/60 | HR 72 | Ht 68.0 in | Wt 170.0 lb

## 2023-12-17 DIAGNOSIS — I10 Essential (primary) hypertension: Secondary | ICD-10-CM | POA: Diagnosis not present

## 2023-12-17 DIAGNOSIS — G4733 Obstructive sleep apnea (adult) (pediatric): Secondary | ICD-10-CM | POA: Diagnosis not present

## 2023-12-17 DIAGNOSIS — E1169 Type 2 diabetes mellitus with other specified complication: Secondary | ICD-10-CM

## 2023-12-17 DIAGNOSIS — I480 Paroxysmal atrial fibrillation: Secondary | ICD-10-CM

## 2023-12-17 DIAGNOSIS — R0609 Other forms of dyspnea: Secondary | ICD-10-CM

## 2023-12-17 DIAGNOSIS — G20A1 Parkinson's disease without dyskinesia, without mention of fluctuations: Secondary | ICD-10-CM | POA: Insufficient documentation

## 2023-12-17 MED ORDER — AMLODIPINE BESYLATE 5 MG PO TABS: 5.0000 mg | ORAL_TABLET | Freq: Every day | ORAL | 3 refills | Status: DC

## 2023-12-17 NOTE — Addendum Note (Signed)
 Addended by: Shawnee Dellen D on: 12/17/2023 04:19 PM   Modules accepted: Orders

## 2023-12-17 NOTE — Progress Notes (Signed)
 Cardiology Office Note:    Date:  12/17/2023   ID:  [name], DOB [birth year], MRN [id number]  PCP:  Jeannine Milroy., MD  Cardiologist:  Ralene Burger, MD    Referring MD: Jeannine Milroy., MD   Chief Complaint  Patient presents with   Follow-up    History of Present Illness:    [name] is a 83 y.o. male past medical history significant for Parkinson's disease, essential hypertension, diabetes, dyslipidemia, PT 12.3 to look for bradycardia present with 5 0 episode of atrial fibrillation, CHADS2 vascular is 5 he was anticoagulated since that time he seems to be fine but he disappeared from follow-up for more than a year.  Comes today doing well denies having chest pain tightness squeezing pressure burning chest.  Clearly pockets on his progress he  Past Medical History:  Diagnosis Date   Anxiety    Xanax  as needed   Arthritis    spinal stenosis.   Asthma    Barrett esophagus    Bipolar disorder (HCC)    BPH (benign prostatic hyperplasia)    Cataract    Chronic kidney disease    since NSAID's stopped -function has improved   COPD (chronic obstructive pulmonary disease) (HCC)    Crohn's colitis (HCC)    Depression    takes Cymbalta daily   Diverticulosis 01/2007   DM (diabetes mellitus) (HCC)    takes Januvia now, metformin stopped due to diarrhea   ED (erectile dysfunction)    GERD (gastroesophageal reflux disease)    uses Tums   Glaucoma    surgery to correct   History of colon polyps    HLD (hyperlipidemia)    takes Lipitor daily   HTN (hypertension)    takes Altace  and Diovan daily   Impaired hearing    "getting this checked out on Monday"   Insomnia    Internal hemorrhoids 01/2007   Joint pain    Joint swelling    Microscopic colitis    Mild ascending aorta dilatation (HCC) 2023   39mm   Mitral regurgitation 2023   mild   Nocturia    Obesity    Osteoarthritis    Pneumonia 10/2007   Sleep apnea    uses CPAP;has been greater than  93yrs since sleep study- Dr. Villa Greaser    Past Surgical History:  Procedure Laterality Date   CATARACT EXTRACTION W/ INTRAOCULAR LENS  IMPLANT, BILATERAL  2011   CHOLECYSTECTOMY  2011   COLONOSCOPY  01/01/2014   Mild patchy colitis of questionable importance. Moderate predominantly sigmoid diverticulosis. Small internal hemorrhoids. Limited examination due to quality of preparation.     COLONOSCOPY W/ BIOPSIES  2008   Looks normal, biopsy suggested a chronic microscopic colitis with rare granulomas   COLONOSCOPY W/ BIOPSIES  02/2011   diverticulosis, internal hemorrhoids, patchy chronic colitis on random bxs,    ESOPHAGOGASTRODUODENOSCOPY  07/18/2017   Barretts esophagus. Small hiatal hernia.    EYE SURGERY     for glaucoma   HERNIA REPAIR     LUMBAR LAMINECTOMY/DECOMPRESSION MICRODISCECTOMY N/A 08/04/2015   Procedure: CENTRAL DECOMPRESSION  LUMBAR LAMINECTOMY L4-L5 ;  Surgeon: Hazle Lites, MD;  Location: [facility];  Service: Orthopedics;  Laterality: N/A;   NOSE SURGERY  2011   PENILE PROSTHESIS IMPLANT  2012   ROTATOR CUFF REPAIR Bilateral    '12 -left, '13 -right   SHOULDER ARTHROSCOPY  08/24/2011   Procedure: ARTHROSCOPY SHOULDER;  Surgeon: Glo Larch;  Location: [facility]  OR;  Service: Orthopedics;  Laterality: Right;  RIGHT SHOULDER ARTHROSCOPY WITH SUBACROMINAL DECOMPRESSION AND DISTAL CLAVICAL RESECTION    sleep apnea surgery  1997   TONSILLECTOMY     as a child   UMBILICAL HERNIA REPAIR  5+yrs ago    Current Medications: Current Meds  Medication Sig   acetaminophen  (TYLENOL ) 325 MG tablet Take 650 mg by mouth every 6 (six) hours as needed for mild pain (pain score 1-3) or moderate pain (pain score 4-6).   albuterol  (VENTOLIN  HFA) 108 (90 Base) MCG/ACT inhaler Inhale 2 puffs into the lungs every 4 (four) hours as needed for wheezing or shortness of breath.   amLODipine  (NORVASC ) 10 MG tablet Take 10 mg by mouth daily.   apixaban  (ELIQUIS ) 5 MG TABS tablet Take 1 tablet (5 mg total)  by mouth 2 (two) times daily.   ARIPiprazole  (ABILIFY ) 2 MG tablet Take 2 mg by mouth daily.   atorvastatin  (LIPITOR) 10 MG tablet Take 10 mg by mouth every other day.   budesonide -formoterol  (SYMBICORT ) 160-4.5 MCG/ACT inhaler Inhale 2 puffs into the lungs 2 (two) times daily.   carbidopa-levodopa (SINEMET IR) 10-100 MG tablet Take 1 tablet 3 (three) times daily by mouth.   Ergocalciferol (VITAMIN D2) 50 MCG (2000 UT) TABS Take 1 Capful by mouth daily.   famotidine  (PEPCID ) 20 MG tablet TAKE 1 TABLET EVERY NIGHT AT BEDTIME (Patient taking differently: Take 20 mg by mouth at bedtime.)   Melatonin 10 MG TABS Take 10 mg by mouth at bedtime as needed (sLEEP).   memantine (NAMENDA) 10 MG tablet Take 10 mg by mouth 2 (two) times daily.   nitroGLYCERIN (NITROSTAT) 0.4 MG SL tablet Place 0.4 mg under the tongue every 5 (five) minutes as needed for chest pain.   ondansetron  (ZOFRAN ) 4 MG tablet Take 4 mg by mouth every 8 (eight) hours as needed for nausea or vomiting.   QUEtiapine  (SEROQUEL ) 100 MG tablet Take 1 tablet (100 mg total) by mouth at bedtime. (Patient taking differently: Take 200 mg by mouth at bedtime.)   senna (SENOKOT) 8.6 MG TABS tablet Take 1 tablet by mouth 2 (two) times daily.   tamsulosin  (FLOMAX ) 0.4 MG CAPS capsule Take 0.4 mg by mouth daily.   valproic acid  (DEPAKENE ) 250 MG/5ML solution Take 5 mLs by mouth 2 (two) times daily.   [DISCONTINUED] albuterol  (PROVENTIL ) (2.5 MG/3ML) 0.083% nebulizer solution TAKE 3 MLS (2.5 MG TOTAL) BY NEBULIZATION EVERY 4 (FOUR) HOURS AS NEEDED FOR WHEEZING OR SHORTNESS OF BREATH.   [DISCONTINUED] bismuth subsalicylate (PEPTO BISMOL) 262 MG/15ML suspension Take 30 mLs by mouth 2 (two) times daily as needed for indigestion.    [DISCONTINUED] budesonide  (PULMICORT ) 0.5 MG/2ML nebulizer solution Take 2 mLs (0.5 mg total) by nebulization 2 (two) times daily.   [DISCONTINUED] Calcium  Carbonate Antacid (TUMS PO) Take 4 tablets by mouth 4 (four) times daily  as needed (heartburn). Reported on 10/06/2015   [DISCONTINUED] cyanocobalamin  (,VITAMIN B-12,) 1000 MCG/ML injection Inject 1,000 mcg into the muscle every 30 (thirty) days.   [DISCONTINUED] divalproex  (DEPAKOTE  ER) 500 MG 24 hr tablet Take 5 mg by mouth 2 (two) times daily.   [DISCONTINUED] EPINEPHrine  (EPIPEN  2-PAK) 0.3 mg/0.3 mL IJ SOAJ injection Inject 0.3 mg into the muscle as needed for anaphylaxis.   [DISCONTINUED] levothyroxine (SYNTHROID) 25 MCG tablet Take 25 mcg by mouth daily before breakfast.   [DISCONTINUED] levothyroxine (SYNTHROID, LEVOTHROID) 50 MCG tablet Take 50 mcg daily before breakfast by mouth.   [DISCONTINUED] LORazepam  (ATIVAN ) 1 MG  tablet 1  qhs (Patient taking differently: Take 1 mg by mouth at bedtime. 1  qhs)   [DISCONTINUED] Multiple Vitamin (MULTIVITAMIN PO) Take 1 tablet by mouth daily.   [DISCONTINUED] OXYGEN  Inhale 2 L into the lungs daily. During day time   [DISCONTINUED] pantoprazole  (PROTONIX ) 40 MG tablet TAKE 1 TABLET TWICE DAILY (Patient taking differently: Take 40 mg by mouth 2 (two) times daily.)   [DISCONTINUED] traMADol (ULTRAM) 50 MG tablet Take 50 mg by mouth every 6 (six) hours as needed for moderate pain (pain score 4-6) or severe pain (pain score 7-10).   [DISCONTINUED] traZODone  (DESYREL ) 50 MG tablet Take 50 mg by mouth at bedtime.     Allergies:   Shellfish allergy, Coly-mycin s [neomycin -colist-hc-thonzonium], and Latex   Social History   Socioeconomic History   Marital status: Widowed    Spouse name: Not on file   Number of children: Not on file   Years of education: Not on file   Highest education level: Not on file  Occupational History   Occupation: retired    Comment: Arts development officer  Tobacco Use   Smoking status: Never    Passive exposure: Never   Smokeless tobacco: Never  Vaping Use   Vaping status: Never Used  Substance and Sexual Activity   Alcohol  use: No   Drug use: No   Sexual activity: Not Currently  Other Topics  Concern   Not on file  Social History Narrative   Retired Environmental consultant, 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   Social Drivers of Corporate investment banker Strain: Unknown (07/06/2021)   Received from [employer], [facility]   Overall Financial Resource Strain (CARDIA)    Difficulty of Paying Living Expenses: Patient declined  Food Insecurity: No Food Insecurity (07/06/2021)   Received [facility]   Hunger Vital Sign    Worried About Running Out of Food in the Last Year: Never true    Ran Out of Food in the Last Year: Never true  Transportation Needs: Unknown (07/06/2021)   Received from [facility], [facility]   [facility] - Transportation    Lack of Transportation (Medical): Patient declined    Lack of Transportation (Non-Medical): Patient declined  Physical Activity: Unknown (07/06/2021)   Received from [facility], [facility]   Exercise Vital Sign    Days of Exercise per Week: 0 days    Minutes of Exercise per Session: Patient declined  Stress: Unknown (07/06/2021)   Received from [facility], [facility]   [employer] of Occupational Health - Occupational Stress Questionnaire    Feeling of Stress : Patient declined  Social Connections: Unknown (12/26/2021)   Received from [facility], [facility]   Social Network    Social Network: Not on file     Family History: The patient's family history includes Alzheimer's disease in his brother; Depression in his mother; Esophageal cancer in his paternal grandmother; Heart attack in his father; Heart failure in his mother; Pneumonia in his father. There is no history of Colon cancer. ROS:   Please see the history of present illness.    All 14 point review of systems negative except as described per history of present illness  EKGs/Labs/Other Studies Reviewed:    EKG Interpretation Date/Time:  Monday Dec 17 2023 15:32:20 EDT Ventricular  Rate:  65 PR Interval:  202 QRS Duration:  122 QT Interval:  430 QTC Calculation: 447 R Axis:   -52  Text Interpretation: Normal sinus rhythm Left anterior fascicular block Abnormal ECG When compared with ECG of 24-Aug-2011 10:23, Left anterior fascicular block is now Present Confirmed by Ralene Burger [phone]) on 12/17/2023 3:44:50 PM    Recent Labs: No results found for requested labs within last 365 days.  Recent Lipid Panel No results found for: "CHOL", "TRIG", "HDL", "CHOLHDL", "VLDL", "LDLCALC", "LDLDIRECT"  Physical Exam:    VS:  BP 92/60 (BP Location: Right Arm, Patient Position: Sitting)   Pulse 72   Ht 5\' 8"  (1.727 m)   Wt 170 lb (77.1 kg)   SpO2 93%   BMI 25.85 kg/m     Wt Readings from Last 3 Encounters:  12/17/23 170 lb (77.1 kg)  04/07/22 188 lb 6.4 oz (85.5 kg)  02/03/22 183 lb 3.2 oz (83.1 kg)     GEN:  Well nourished, well developed in no acute distress HEENT: Normal NECK: No JVD; No carotid bruits LYMPHATICS: No lymphadenopathy CARDIAC: RRR, no murmurs, no rubs, no gallops RESPIRATORY:  Clear to auscultation without rales, wheezing or rhonchi  ABDOMEN: Soft, non-tender, non-distended MUSCULOSKELETAL:  No edema; No deformity  SKIN: Warm and dry LOWER EXTREMITIES: no swelling NEUROLOGIC:  Alert and oriented x 3 PSYCHIATRIC:  Normal affect   ASSESSMENT:    1. Paroxysmal atrial fibrillation (HCC)   2. Essential hypertension   3. Obstructive apnea   4. Type 2 diabetes mellitus with other specified complication, unspecified whether long term insulin  use (HCC)   5. Parkinson's disease, unspecified whether dyskinesia present, unspecified whether manifestations fluctuate (HCC)    PLAN:    In order of problems listed above:  Paroxysmal atrial fibrillation maintained sinus rhythm anticoagulated 5 mg of Eliquis  twice daily dose is appropriate to his weight age and kidney function.  Will continue. Essential hypertension blood pressure actually low I will  cut down his amlodipine  to only 2.5 mg daily. Swelling of lower extremities which is only mild I suspect this is related to amlodipine  10 mg daily I will reduce amlodipine  to 5 I will schedule him to Echocardiogram showed that his left ventricle ejection fraction is preserved. Parkinson's disease progressing noted.   Medication Adjustments/Labs and Tests Ordered: Current medicines are reviewed at length with the patient today.  Concerns regarding medicines are outlined above.  Orders Placed This Encounter  Procedures   EKG 12-Lead   Medication changes: No orders of the defined types were placed in this encounter.   Signed, Manfred Seed, MD, [facility] 12/17/2023 4:03 PM    [facility]

## 2023-12-17 NOTE — Patient Instructions (Signed)
 Medication Instructions:   DECREASE: Amlodipine  to 5mg  daily   Lab Work: CBC, CMP, TSH- today If you have labs (blood work) drawn today and your tests are completely normal, you will receive your results only by: MyChart Message (if you have MyChart) OR A paper copy in the mail If you have any lab test that is abnormal or we need to change your treatment, we will call you to review the results.   Testing/Procedures: Your physician has requested that you have an echocardiogram. Echocardiography is a painless test that uses sound waves to create images of your heart. It provides your doctor with information about the size and shape of your heart and how well your heart's chambers and valves are working. This procedure takes approximately one hour. There are no restrictions for this procedure. Please do NOT wear cologne, perfume, aftershave, or lotions (deodorant is allowed). Please arrive 15 minutes prior to your appointment time.  Please note: We ask at that you not bring children with you during ultrasound (echo/ vascular) testing. Due to room size and safety concerns, children are not allowed in the ultrasound rooms during exams. Our front office staff cannot provide observation of children in our lobby area while testing is being conducted. An adult accompanying a patient to their appointment will only be allowed in the ultrasound room at the discretion of the ultrasound technician under special circumstances. We apologize for any inconvenience.    Follow-Up: At [facility], you and your health needs are our priority.  As part of our continuing mission to provide you with exceptional heart care, we have created designated Provider Care Teams.  These Care Teams include your primary Cardiologist (physician) and Advanced Practice Providers (APPs -  Physician Assistants and Nurse Practitioners) who all work together to provide you with the care you need, when you need it.  We recommend  signing up for the patient portal called "MyChart".  Sign up information is provided on this After Visit Summary.  MyChart is used to connect with patients for Virtual Visits (Telemedicine).  Patients are able to view lab/test results, encounter notes, upcoming appointments, etc.  Non-urgent messages can be sent to your provider as well.   To learn more about what you can do with MyChart, go to [email or web address].    Your next appointment:   12 month(s)  The format for your next appointment:   In Person  Provider:   Ralene Burger, MD    Other Instructions NA

## 2023-12-18 LAB — COMPREHENSIVE METABOLIC PANEL WITH GFR
ALT: 9 IU/L (ref 0–44)
AST: 13 IU/L (ref 0–40)
Albumin: 4 g/dL (ref 3.8–4.8)
Alkaline Phosphatase: 104 IU/L (ref 44–121)
BUN/Creatinine Ratio: 12 (ref 10–24)
BUN: 17 mg/dL (ref 8–27)
Bilirubin Total: 0.4 mg/dL (ref 0.0–1.2)
CO2: 23 mmol/L (ref 20–29)
Calcium: 9.4 mg/dL (ref 8.6–10.2)
Chloride: 102 mmol/L (ref 96–106)
Creatinine, Ser: 1.41 mg/dL — ABNORMAL HIGH (ref 0.76–1.27)
Globulin, Total: 2.7 g/dL (ref 1.5–4.5)
Glucose: 139 mg/dL — ABNORMAL HIGH (ref 70–99)
Potassium: 4.3 mmol/L (ref 3.5–5.2)
Sodium: 141 mmol/L (ref 134–144)
Total Protein: 6.7 g/dL (ref 6.0–8.5)
eGFR: 50 mL/min/{1.73_m2} — ABNORMAL LOW (ref >59)

## 2023-12-18 LAB — TSH: TSH: 1.58 u[IU]/mL (ref 0.450–4.500)

## 2023-12-18 LAB — CBC
Hematocrit: 44.6 % (ref 37.5–51.0)
Hemoglobin: 14.4 g/dL (ref 13.0–17.7)
MCH: 29.2 pg (ref 26.6–33.0)
MCHC: 32.3 g/dL (ref 31.5–35.7)
MCV: 91 fL (ref 79–97)
Platelets: 217 10*3/uL (ref 150–450)
RBC: 4.93 x10E6/uL (ref 4.14–5.80)
RDW: 13.9 % (ref 11.6–15.4)
WBC: 7.2 10*3/uL (ref 3.4–10.8)

## 2023-12-25 ENCOUNTER — Ambulatory Visit: Payer: Self-pay

## 2023-12-25 NOTE — Telephone Encounter (Signed)
 Called and spoke to Marion about results per Dr. Tonja Fray note. She had no further questions at this time.

## 2023-12-25 NOTE — Telephone Encounter (Signed)
-----   Message from Ralene Burger sent at 12/20/2023  4:50 PM EDT ----- All blood tests are stable continue present management

## 2024-01-02 ENCOUNTER — Ambulatory Visit: Admitting: Gastroenterology

## 2024-01-04 ENCOUNTER — Other Ambulatory Visit: Payer: Self-pay | Admitting: Cardiology

## 2024-01-04 DIAGNOSIS — I480 Paroxysmal atrial fibrillation: Secondary | ICD-10-CM

## 2024-01-08 NOTE — Telephone Encounter (Signed)
 Prescription refill request for Eliquis  received. Indication: PAF Last office visit: 12/17/23  Otilio Block MD Scr: 1.41 on 12/17/23  Epic Age: 83 Weight: 77.1kg  Based on above findings Eliquis  5mg  twice daily is the appropriate dose.  Refill approved.

## 2024-01-11 ENCOUNTER — Ambulatory Visit: Attending: Cardiology

## 2024-01-11 DIAGNOSIS — R0609 Other forms of dyspnea: Secondary | ICD-10-CM

## 2024-01-13 LAB — ECHOCARDIOGRAM COMPLETE
Area-P 1/2: 3.99 cm2
P 1/2 time: 632 ms
S' Lateral: 3.2 cm

## 2024-02-01 ENCOUNTER — Other Ambulatory Visit: Payer: Self-pay

## 2024-02-01 MED ORDER — AMLODIPINE BESYLATE 5 MG PO TABS: 5.0000 mg | ORAL_TABLET | Freq: Every day | ORAL | 3 refills | Status: AC

## 2024-02-19 NOTE — Progress Notes (Unsigned)
 Chief Complaint:change in bowel habits Primary GI Doctor:Dr. Charlanne  HPI:  Patient is a  83  year old male patient with past medical history of GERD with HH with Barretts, microscopic colitis?, who was self referred to me for a complaint of change in bowel habits .   Last seen by Dr. Charlanne in GI office on 07/14/2019.  H/O (microscopic) colitis (? Mild Crohn's) in past.  Diarrhea previously resolved when he was taken off Metformin.  Failed trial of mesalamine  in past.  GERD with 3 cm HH with Barrett's esophagus (S/P EGD 07/2017, no dysplasia).  Has intermittent N/V.  Kirsty Monjaraz have associated diabetic gastroparesis.    Interval History    Patient presents with main complaint of altered bowel habits, accompanied by transport. Patient resides at [facility] senior care community. Patient reports he will go 7 days without a bowel movement. He takes stool softener 1 capsule twice daily. He reports taking OTC MOM for constipation prn which will causes severe diarrhea. He reports intermittent BRB on toilet paper with wiping. He transfers himself to the toilet via wheelchair. His appetite is fair. He admits he does not drink a lot of water. They have three meals a day at the facility but he typically only eats twice a day. He will have mild nausea and abd discomfort when he does not have a bowel movement. Last BM yesterday and he reports large amount.  He wants to discuss other options today.      Patient also has history of GERD, unsure if he is taking the famotidine  20 mg po daily that is listed on his medication list. He has daily pyrosis. No dysphagia.   Past GI work-up: --CT AP W contrast 07/2019: no acute findings within abd or pelvis. Mild bladder wall thickening. Aortic atherosclerosis. -EGD 07/18/2017: 3 cm hiatal hernia, 5 cm Barrett's esophagus. Bx-negative for dysplasia. 11/2013: Barrett's esophagus, HH, mild gastritis. Bx- neg for dysplasia. Neg SB Bx for celiac. -CT AP with contrast  04/28/2016: No acute inflammation, sigmoid diverticulosis, coronary artery calcifications, enlarged prostate, penile implant. -Colonoscopy 12/2013: Poor preparation.  Patchy colitis throughout the colon.  Moderate sigmoid diverticulosis. Bx-focal active colitis.  No granulomas.  He refused further colonoscopies. Prev colon 02/14/2011: Mild patchy colitis. Nl TI. Bx-colitis ?  Etiology.  Could represent Crohn's.  Was given trial of mesalamine  without any benefit.   Wt Readings from Last 3 Encounters:  02/20/24 175 lb (79.4 kg)  12/17/23 170 lb (77.1 kg)  04/07/22 188 lb 6.4 oz (85.5 kg)     Past Medical History:  Diagnosis Date   Anxiety    Xanax  as needed   Arthritis    spinal stenosis.   Asthma    Barrett esophagus    Bipolar disorder (HCC)    BPH (benign prostatic hyperplasia)    Cataract    Chronic kidney disease    since NSAID's stopped -function has improved   COPD (chronic obstructive pulmonary disease) (HCC)    Crohn's colitis (HCC)    Depression    takes Cymbalta daily   Diverticulosis 01/2007   DM (diabetes mellitus) (HCC)    takes Januvia now, metformin stopped due to diarrhea   ED (erectile dysfunction)    GERD (gastroesophageal reflux disease)    uses Tums   Glaucoma    surgery to correct   History of colon polyps    HLD (hyperlipidemia)    takes Lipitor daily   HTN (hypertension)    takes  Altace  and Diovan daily   Impaired hearing    getting this checked out on Monday   Insomnia    Internal hemorrhoids 01/2007   Joint pain    Joint swelling    Microscopic colitis    Mild ascending aorta dilatation (HCC) 2023   39mm   Mitral regurgitation 2023   mild   Nocturia    Obesity    Osteoarthritis    Parkinson's disease (HCC)    Pneumonia 10/2007   Sleep apnea    uses CPAP;has been greater than 60yrs since sleep study- Dr. Jude    Past Surgical History:  Procedure Laterality Date   CATARACT EXTRACTION W/ INTRAOCULAR LENS  IMPLANT, BILATERAL  2011    CHOLECYSTECTOMY  2011   COLONOSCOPY  01/01/2014   Mild patchy colitis of questionable importance. Moderate predominantly sigmoid diverticulosis. Small internal hemorrhoids. Limited examination due to quality of preparation.     COLONOSCOPY W/ BIOPSIES  2008   Looks normal, biopsy suggested a chronic microscopic colitis with rare granulomas   COLONOSCOPY W/ BIOPSIES  02/2011   diverticulosis, internal hemorrhoids, patchy chronic colitis on random bxs,    ESOPHAGOGASTRODUODENOSCOPY  07/18/2017   Barretts esophagus. Small hiatal hernia.    EYE SURGERY     for glaucoma   HERNIA REPAIR     LUMBAR LAMINECTOMY/DECOMPRESSION MICRODISCECTOMY N/A 08/04/2015   Procedure: CENTRAL DECOMPRESSION  LUMBAR LAMINECTOMY L4-L5 ;  Surgeon: Tanda Heading, MD;  Location: [facility];  Service: Orthopedics;  Laterality: N/A;   NOSE SURGERY  2011   PENILE PROSTHESIS IMPLANT  2012   ROTATOR CUFF REPAIR Bilateral    '12 -left, '13 -right   SHOULDER ARTHROSCOPY  08/24/2011   Procedure: ARTHROSCOPY SHOULDER;  Surgeon: Franky CHRISTELLA Pointer;  Location: [facility];  Service: Orthopedics;  Laterality: Right;  RIGHT SHOULDER ARTHROSCOPY WITH SUBACROMINAL DECOMPRESSION AND DISTAL CLAVICAL RESECTION    sleep apnea surgery  1997   TONSILLECTOMY     as a child   UMBILICAL HERNIA REPAIR  5+yrs ago    Current Outpatient Medications  Medication Sig Dispense Refill   Cholecalciferol (VITAMIN D3) 50 MCG (2000 UT) CAPS Take 1 capsule by mouth daily.     famotidine  (PEPCID ) 20 MG tablet Take 1 tablet (20 mg total) by mouth 2 (two) times daily. 60 tablet 2   hydrocortisone  (ANUSOL -HC) 25 MG suppository Place 1 suppository (25 mg total) rectally at bedtime. 10 suppository 0   polyethylene glycol powder (GLYCOLAX /MIRALAX ) 17 GM/SCOOP powder Take 17 g by mouth daily. 255 g 3   acetaminophen  (TYLENOL ) 325 MG tablet Take 650 mg by mouth every 6 (six) hours as needed for mild pain (pain score 1-3) or moderate pain (pain score 4-6).     albuterol   (VENTOLIN  HFA) 108 (90 Base) MCG/ACT inhaler Inhale 2 puffs into the lungs every 4 (four) hours as needed for wheezing or shortness of breath. 18 g 2   amLODipine  (NORVASC ) 5 MG tablet Take 1 tablet (5 mg total) by mouth daily. 90 tablet 3   apixaban  (ELIQUIS ) 5 MG TABS tablet TAKE 1 TABLET(5 MG) BY MOUTH TWICE DAILY 60 tablet 5   ARIPiprazole  (ABILIFY ) 2 MG tablet Take 2 mg by mouth daily.     atorvastatin  (LIPITOR) 10 MG tablet Take 10 mg by mouth every other day.     budesonide -formoterol  (SYMBICORT ) 160-4.5 MCG/ACT inhaler Inhale 2 puffs into the lungs 2 (two) times daily.     carbidopa-levodopa (SINEMET IR) 10-100 MG tablet Take 1 tablet 3 (three)  times daily by mouth.     Ergocalciferol (VITAMIN D2) 50 MCG (2000 UT) TABS Take 1 Capful by mouth daily.     Melatonin 10 MG TABS Take 10 mg by mouth at bedtime as needed (sLEEP).     memantine (NAMENDA) 10 MG tablet Take 10 mg by mouth 2 (two) times daily.     nitroGLYCERIN (NITROSTAT) 0.4 MG SL tablet Place 0.4 mg under the tongue every 5 (five) minutes as needed for chest pain.     ondansetron  (ZOFRAN ) 4 MG tablet Take 4 mg by mouth every 8 (eight) hours as needed for nausea or vomiting.     QUEtiapine  (SEROQUEL ) 100 MG tablet Take 1 tablet (100 mg total) by mouth at bedtime. (Patient taking differently: Take 200 mg by mouth at bedtime.) 90 tablet 4   senna (SENOKOT) 8.6 MG TABS tablet Take 1 tablet by mouth 2 (two) times daily.     tamsulosin  (FLOMAX ) 0.4 MG CAPS capsule Take 0.4 mg by mouth daily.     valproic acid  (DEPAKENE ) 250 MG/5ML solution Take 5 mLs by mouth 2 (two) times daily.     No current facility-administered medications for this visit.    Allergies as of 02/20/2024 - Review Complete 02/20/2024  Allergen Reaction Noted   Shellfish allergy Anaphylaxis 01/25/2011   Coly-mycin s [neomycin -colist-hc-thonzonium] Rash 12/27/2020   Latex Rash 08/01/2018    Family History  Problem Relation Age of Onset   Esophageal cancer  Paternal Grandmother    Heart failure Mother    Depression Mother    Alzheimer's disease Brother    Heart attack Father    Pneumonia Father    Colon cancer Neg Hx     Review of Systems:    Constitutional: No weight loss, fever, chills, weakness or fatigue HEENT: Eyes: No change in vision               Ears, Nose, Throat:  No change in hearing or congestion Skin: No rash or itching Cardiovascular: No chest pain, chest pressure or palpitations   Respiratory: No SOB or cough Gastrointestinal: See HPI and otherwise negative Genitourinary: No dysuria or change in urinary frequency Neurological: No headache, dizziness or syncope Musculoskeletal: No new muscle or joint pain Hematologic: No bleeding or bruising Psychiatric: No history of depression or anxiety    Physical Exam:  Vital signs: BP 112/66   Pulse (!) 54   Ht 5' 8 (1.727 m)   Wt 175 lb (79.4 kg)   SpO2 98%   BMI 26.61 kg/m   Constitutional:   Pleasant  male appears to be in NAD, Well nourished, alert and cooperative Throat: Oral cavity and pharynx without inflammation, swelling or lesion.  Respiratory: Respirations even and unlabored. Lungs clear to auscultation bilaterally.   No wheezes, crackles, or rhonchi.  Cardiovascular: Normal S1, S2. Regular rate and rhythm. No peripheral edema, cyanosis or pallor.  Gastrointestinal:  Soft, nondistended, nontender. No rebound or guarding. hypoactive bowel sounds. No appreciable masses or hepatomegaly. Rectal:  Not performed. Msk: Patient in wheelchair, unable to walk only transfer Neurologic:  Alert and  oriented x4;  grossly normal neurologically.  Skin:   Dry and intact without significant lesions or rashes. Psychiatric: Oriented to person, place and time. Demonstrates good judgement and reason without abnormal affect or behaviors.  RELEVANT LABS AND IMAGING: CBC    Latest Ref Rng & Units 02/20/2024   12:35 PM 12/17/2023    4:27 PM 08/17/2022   11:32 AM  CBC  WBC  3.4 -  10.8 x10E3/uL  7.2  5.8   Hemoglobin 13.0 - 17.0 g/dL 86.5  85.5  85.2   Hematocrit 39.0 - 52.0 % 41.0  44.6  43.9   Platelets 150 - 450 x10E3/uL  217  190      CMP     Latest Ref Rng & Units 12/17/2023    4:27 PM 08/17/2022   11:32 AM 08/05/2015   11:14 AM  CMP  Glucose 70 - 99 mg/dL 860  857  812   BUN 8 - 27 mg/dL 17  14  20    Creatinine 0.76 - 1.27 mg/dL 8.58  8.56  8.54   Sodium 134 - 144 mmol/L 141  146  140   Potassium 3.5 - 5.2 mmol/L 4.3  4.0  4.1   Chloride 96 - 106 mmol/L 102  104  104   CO2 20 - 29 mmol/L 23  23  27    Calcium  8.6 - 10.2 mg/dL 9.4  9.1  8.7   Total Protein 6.0 - 8.5 g/dL 6.7   6.0   Total Bilirubin 0.0 - 1.2 mg/dL 0.4   1.0   Alkaline Phos 44 - 121 IU/L 104   59   AST 0 - 40 IU/L 13   25   ALT 0 - 44 IU/L 9   18      Lab Results  Component Value Date   TSH 1.580 12/17/2023     Assessment: Encounter Diagnoses  Name Primary?   Gastroesophageal reflux disease, unspecified whether esophagitis present    Chronic constipation    Internal hemorrhoids Yes      83 year old male patient who presents with complaints of chronic constipation who is wheelchair-bound.  Patient reports he has been taking daily stool softeners and over-the-counter milk of magnesium.  He reports this regimen causes severe diarrhea.  We discussed switching to over-the-counter MiraLAX  p.o. daily as needed and stopping stool softeners if stool is loose.  Also encourage patient to drink adequate amount of water.  Patient has concerns of intermittent bright red blood with wiping we will go ahead and check H&H today to rule out anemia. Pt is on OAC.  I suspect his symptoms are most likely internal hemorrhoids as shown on last colonoscopy.  Will go ahead and give patient Anusol  suppositories to use as needed.  Would not consider any procedures at this time due to patient's comorbidities and current state of health he would be considered high risk.     Patient also has history of GERD and  reports daily pyrosis and regurgitation on daily Pepcid .  Patient not sure if he is taking scheduled we will go ahead and increase Pepcid  20 mg to twice daily.  If no improvement Derriona Branscom consider GI therapy.   Plan: -Check Hemoglobin and Hematocrit today -Start daily miralax  po daily -Stop stool softeners if stool is loose and use only as needed -Anusol  suppository at bedtime as needed hemorrhoids - Increase famotidine  20mg  twice daily  Thank you for the courtesy of this consult. Please call me with any questions or concerns.   Analiah Drum, FNP-C [facility] 02/20/2024, 1:21 PM  Cc: Loreli Elsie JONETTA Mickey., MD

## 2024-02-20 ENCOUNTER — Other Ambulatory Visit (INDEPENDENT_AMBULATORY_CARE_PROVIDER_SITE_OTHER)

## 2024-02-20 ENCOUNTER — Ambulatory Visit (INDEPENDENT_AMBULATORY_CARE_PROVIDER_SITE_OTHER): Admitting: Gastroenterology

## 2024-02-20 ENCOUNTER — Encounter: Payer: Self-pay | Admitting: Gastroenterology

## 2024-02-20 VITALS — BP 112/66 | HR 54 | Ht 68.0 in | Wt 175.0 lb

## 2024-02-20 DIAGNOSIS — K648 Other hemorrhoids: Secondary | ICD-10-CM | POA: Diagnosis not present

## 2024-02-20 DIAGNOSIS — K5909 Other constipation: Secondary | ICD-10-CM

## 2024-02-20 DIAGNOSIS — K219 Gastro-esophageal reflux disease without esophagitis: Secondary | ICD-10-CM

## 2024-02-20 LAB — HEMATOCRIT: HCT: 41 % (ref 39.0–52.0)

## 2024-02-20 LAB — HEMOGLOBIN: Hemoglobin: 13.4 g/dL (ref 13.0–17.0)

## 2024-02-20 MED ORDER — FAMOTIDINE 20 MG PO TABS: 20.0000 mg | ORAL_TABLET | Freq: Two times a day (BID) | ORAL | 2 refills | Status: AC

## 2024-02-20 MED ORDER — POLYETHYLENE GLYCOL 3350 17 GM/SCOOP PO POWD: 17.0000 g | Freq: Every day | ORAL | 3 refills | Status: AC

## 2024-02-20 MED ORDER — HYDROCORTISONE ACETATE 25 MG RE SUPP
25.0000 mg | Freq: Every evening | RECTAL | 0 refills | Status: AC
Start: 2024-02-20 — End: ?

## 2024-02-20 NOTE — Patient Instructions (Signed)
 Your provider has requested that you go to the basement level for lab work before leaving today. Press B on the elevator. The lab is located at the first door on the left as you exit the elevator.  Due to recent changes in healthcare laws, you may see the results of your imaging and laboratory studies on MyChart before your provider has had a chance to review them.  We understand that in some cases there may be results that are confusing or concerning to you. Not all laboratory results come back in the same time frame and the provider may be waiting for multiple results in order to interpret others.  Please give us  48 hours in order for your provider to thoroughly review all the results before contacting the office for clarification of your results.    Thank you for trusting me with your gastrointestinal care!   Deanna May, NP-C  _______________________________________________________  If your blood pressure at your visit was 140/90 or greater, please contact your primary care physician to follow up on this.  _______________________________________________________  If you are age 18 or older, your body mass index should be between 23-30. Your Body mass index is 26.61 kg/m. If this is out of the aforementioned range listed, please consider follow up with your Primary Care Provider.  If you are age 78 or younger, your body mass index should be between 19-25. Your Body mass index is 26.61 kg/m. If this is out of the aformentioned range listed, please consider follow up with your Primary Care Provider.   ________________________________________________________  The [facility] providers would like to encourage you to use MYCHART to communicate with providers for non-urgent requests or questions.  Due to long hold times on the telephone, sending your provider a message by [facility] may be a faster and more efficient way to get a response.  Please allow 48 business hours for a response.  Please  remember that this is for non-urgent requests.  _______________________________________________________

## 2024-02-21 ENCOUNTER — Ambulatory Visit: Payer: Self-pay | Admitting: Gastroenterology

## 2024-02-26 ENCOUNTER — Other Ambulatory Visit: Payer: Self-pay

## 2024-02-26 DIAGNOSIS — I480 Paroxysmal atrial fibrillation: Secondary | ICD-10-CM

## 2024-02-26 MED ORDER — APIXABAN 5 MG PO TABS: 5.0000 mg | ORAL_TABLET | Freq: Two times a day (BID) | ORAL | 5 refills | Status: AC

## 2024-02-26 NOTE — Telephone Encounter (Signed)
 Prescription refill request for Eliquis  received. Indication:afib Last office visit:5/25 Scr:1.41  5/25 Age: 83 Weight:79.4  kg  Prescription refilled

## 2024-08-22 ENCOUNTER — Ambulatory Visit (INDEPENDENT_AMBULATORY_CARE_PROVIDER_SITE_OTHER): Admitting: Podiatry

## 2024-08-22 DIAGNOSIS — E1151 Type 2 diabetes mellitus with diabetic peripheral angiopathy without gangrene: Secondary | ICD-10-CM | POA: Diagnosis not present

## 2024-08-22 DIAGNOSIS — M79674 Pain in right toe(s): Secondary | ICD-10-CM

## 2024-08-22 DIAGNOSIS — M79675 Pain in left toe(s): Secondary | ICD-10-CM

## 2024-08-22 DIAGNOSIS — Z9181 History of falling: Secondary | ICD-10-CM | POA: Diagnosis not present

## 2024-08-22 DIAGNOSIS — B351 Tinea unguium: Secondary | ICD-10-CM

## 2024-08-22 NOTE — Progress Notes (Unsigned)
 "   Chief Complaint  Patient presents with   rfc    Not diabetic, takes Eliquis . Nails are thick, elongated, painful when pressed. Facility transportation staff is in room with patient.    Discussed the use of AI scribe software for clinical note transcription with the patient, who gave verbal consent to proceed.  History of Present Illness [name] is an 83 year old male with nail dystrophy and onychomycosis who presents with left third toenail pain.  He reports localized pain in the left third toenail, describing the nail as very sore and noting abnormal vertical growth.  He attends podiatry visits every two to three months for ongoing nail care and has not delayed treatment for nail issues.  He experiences intermittent pruritus in his feet, though he is unable to specify the exact location. He has not observed any recent changes in his toenails or skin.  Approximately two years ago, he sustained a fracture to the left third toe, resulting in residual deformity with lateral deviation of the toe.  He expresses concern regarding possible fungal involvement of his nails and acknowledges mild onychomycosis.     Past Medical History:  Diagnosis Date   Anxiety    Xanax  as needed   Arthritis    spinal stenosis.   Asthma    Barrett esophagus    Bipolar disorder (HCC)    BPH (benign prostatic hyperplasia)    Cataract    Chronic kidney disease    since NSAID's stopped -function has improved   COPD (chronic obstructive pulmonary disease) (HCC)    Crohn's colitis (HCC)    Depression    takes Cymbalta daily   Diverticulosis 01/2007   DM (diabetes mellitus) (HCC)    takes Januvia now, metformin stopped due to diarrhea   ED (erectile dysfunction)    GERD (gastroesophageal reflux disease)    uses Tums   Glaucoma    surgery to correct   History of colon polyps    HLD (hyperlipidemia)    takes Lipitor daily   HTN (hypertension)    takes Altace  and Diovan daily    Impaired hearing    getting this checked out on Monday   Insomnia    Internal hemorrhoids 01/2007   Joint pain    Joint swelling    Microscopic colitis    Mild ascending aorta dilatation 2023   39mm   Mitral regurgitation 2023   mild   Nocturia    Obesity    Osteoarthritis    Parkinson's disease (HCC)    Pneumonia 10/2007   Sleep apnea    uses CPAP;has been greater than 78yrs since sleep study- Dr. Jude   Past Surgical History:  Procedure Laterality Date   CATARACT EXTRACTION W/ INTRAOCULAR LENS  IMPLANT, BILATERAL  2011   CHOLECYSTECTOMY  2011   COLONOSCOPY  01/01/2014   Mild patchy colitis of questionable importance. Moderate predominantly sigmoid diverticulosis. Small internal hemorrhoids. Limited examination due to quality of preparation.     COLONOSCOPY W/ BIOPSIES  2008   Looks normal, biopsy suggested a chronic microscopic colitis with rare granulomas   COLONOSCOPY W/ BIOPSIES  02/2011   diverticulosis, internal hemorrhoids, patchy chronic colitis on random bxs,    ESOPHAGOGASTRODUODENOSCOPY  07/18/2017   Barretts esophagus. Small hiatal hernia.    EYE SURGERY     for glaucoma   HERNIA REPAIR     LUMBAR LAMINECTOMY/DECOMPRESSION MICRODISCECTOMY N/A 08/04/2015   Procedure: CENTRAL DECOMPRESSION  LUMBAR LAMINECTOMY L4-L5 ;  Surgeon:  Tanda Heading, MD;  Location: [facility];  Service: Orthopedics;  Laterality: N/A;   NOSE SURGERY  2011   PENILE PROSTHESIS IMPLANT  2012   ROTATOR CUFF REPAIR Bilateral    '12 -left, '13 -right   SHOULDER ARTHROSCOPY  08/24/2011   Procedure: ARTHROSCOPY SHOULDER;  Surgeon: Franky CHRISTELLA Pointer;  Location: [facility];  Service: Orthopedics;  Laterality: Right;  RIGHT SHOULDER ARTHROSCOPY WITH SUBACROMINAL DECOMPRESSION AND DISTAL CLAVICAL RESECTION    sleep apnea surgery  1997   TONSILLECTOMY     as a child   UMBILICAL HERNIA REPAIR  5+yrs ago   Allergies[1]  Physical Exam EXTREMITIES: DP 2/4 bilateral, PT 0/4 bilateral.  Digital hair sparse.   Nails x 10 are 3 mm thick, with yellow discoloration, distal onycholysis, and pain with compression. SKIN: Skin without any open lesions or interdigital maceration.  Skin is shiny and atrophic.    Results Nail debridement performed today Nails x10 were sharply debrided with sterile nail nippers and power debriding burr to decrease bulk and length. Immediate relief after debridement.    Assessment/Plan of Care: 1. Pain due to onychomycosis of toenails of both feet   2. At risk for falling   3. Type II diabetes mellitus with peripheral circulatory disorder [facility])     Assessment & Plan Onychomycosis x 10 Mild onychomycosis affecting all 10 nails without complications. Condition is managed conservatively with routine debridement. - Sharply debrided all 10 toenails using sterile nail nippers and a power debriding burr to decrease bulk and length.  Nail dystrophy, left third toe Chronic nail dystrophy of the left third toe characterized by abnormal nail growth and localized soreness. Immediate symptomatic relief was achieved following debridement. Ongoing management requires regular podiatric care. - Sharply debrided the dystrophic nail of the left third toe to reduce discomfort and improve appearance. - Scheduled follow-up in 2-3 months for continued nail care.   Follow-up 3 months  Melvena Vink DSABRA Imperial, DPM, FACFAS [facility]     [address] [place], [place] [address]                Office [phone]  Fax [phone]      [1]  Allergies Allergen Reactions   Shellfish Allergy Anaphylaxis    Patient informed RN no problem with Betadine 08/04/2015    Coly-Mycin S [Neomycin -Colist-Hc-Thonzonium] Rash   Latex Rash    Reports facial rash from Cpap mask   "

## 2024-11-20 ENCOUNTER — Ambulatory Visit: Admitting: Podiatry
